# Patient Record
Sex: Male | Born: 1955
Health system: Southern US, Community
[De-identification: ages and names within clinical notes are randomized; demographics above are authoritative.]

## PROBLEM LIST (undated history)

## (undated) DIAGNOSIS — C439 Malignant melanoma of skin, unspecified: Secondary | ICD-10-CM

## (undated) DIAGNOSIS — L570 Actinic keratosis: Secondary | ICD-10-CM

## (undated) DIAGNOSIS — D689 Coagulation defect, unspecified: Secondary | ICD-10-CM

## (undated) DIAGNOSIS — J189 Pneumonia, unspecified organism: Secondary | ICD-10-CM

## (undated) DIAGNOSIS — E785 Hyperlipidemia, unspecified: Secondary | ICD-10-CM

## (undated) DIAGNOSIS — I10 Essential (primary) hypertension: Secondary | ICD-10-CM

## (undated) DIAGNOSIS — K219 Gastro-esophageal reflux disease without esophagitis: Secondary | ICD-10-CM

## (undated) DIAGNOSIS — R519 Headache, unspecified: Secondary | ICD-10-CM

## (undated) DIAGNOSIS — T7840XA Allergy, unspecified, initial encounter: Secondary | ICD-10-CM

## (undated) DIAGNOSIS — R51 Headache: Secondary | ICD-10-CM

## (undated) DIAGNOSIS — E119 Type 2 diabetes mellitus without complications: Secondary | ICD-10-CM

## (undated) DIAGNOSIS — R12 Heartburn: Secondary | ICD-10-CM

## (undated) DIAGNOSIS — R7303 Prediabetes: Secondary | ICD-10-CM

## (undated) DIAGNOSIS — N289 Disorder of kidney and ureter, unspecified: Secondary | ICD-10-CM

## (undated) DIAGNOSIS — L409 Psoriasis, unspecified: Secondary | ICD-10-CM

## (undated) DIAGNOSIS — J40 Bronchitis, not specified as acute or chronic: Secondary | ICD-10-CM

## (undated) HISTORY — DX: Essential (primary) hypertension: I10

## (undated) HISTORY — DX: Hyperlipidemia, unspecified: E78.5

## (undated) HISTORY — DX: Actinic keratosis: L57.0

## (undated) HISTORY — DX: Heartburn: R12

## (undated) HISTORY — DX: Allergy, unspecified, initial encounter: T78.40XA

## (undated) HISTORY — DX: Gastro-esophageal reflux disease without esophagitis: K21.9

## (undated) HISTORY — PX: FINGER GANGLION CYST EXCISION: SHX1636

## (undated) HISTORY — DX: Malignant melanoma of skin, unspecified: C43.9

## (undated) HISTORY — DX: Psoriasis, unspecified: L40.9

## (undated) HISTORY — DX: Coagulation defect, unspecified: D68.9

## (undated) HISTORY — DX: Prediabetes: R73.03

---

## 2009-06-19 LAB — CONVERTED CEMR LAB
HDL: 37 mg/dL
HDL: 37 mg/dL
LDL Cholesterol: 100 mg/dL
LDL Cholesterol: 100 mg/dL
LDL Cholesterol: 100 mg/dL

## 2009-07-16 ENCOUNTER — Encounter: Payer: Self-pay | Admitting: Family Medicine

## 2009-09-23 ENCOUNTER — Ambulatory Visit: Payer: Self-pay | Admitting: Family Medicine

## 2009-09-23 DIAGNOSIS — E7800 Pure hypercholesterolemia, unspecified: Secondary | ICD-10-CM | POA: Insufficient documentation

## 2009-09-23 DIAGNOSIS — G8929 Other chronic pain: Secondary | ICD-10-CM | POA: Insufficient documentation

## 2009-09-23 DIAGNOSIS — N529 Male erectile dysfunction, unspecified: Secondary | ICD-10-CM | POA: Insufficient documentation

## 2009-09-23 DIAGNOSIS — I10 Essential (primary) hypertension: Secondary | ICD-10-CM | POA: Insufficient documentation

## 2009-09-23 DIAGNOSIS — E1169 Type 2 diabetes mellitus with other specified complication: Secondary | ICD-10-CM | POA: Insufficient documentation

## 2009-09-23 DIAGNOSIS — I152 Hypertension secondary to endocrine disorders: Secondary | ICD-10-CM | POA: Insufficient documentation

## 2009-09-23 DIAGNOSIS — M545 Low back pain, unspecified: Secondary | ICD-10-CM | POA: Insufficient documentation

## 2009-09-23 DIAGNOSIS — G43009 Migraine without aura, not intractable, without status migrainosus: Secondary | ICD-10-CM | POA: Insufficient documentation

## 2009-09-23 DIAGNOSIS — K219 Gastro-esophageal reflux disease without esophagitis: Secondary | ICD-10-CM | POA: Insufficient documentation

## 2009-09-23 DIAGNOSIS — L2089 Other atopic dermatitis: Secondary | ICD-10-CM | POA: Insufficient documentation

## 2009-09-23 DIAGNOSIS — E1159 Type 2 diabetes mellitus with other circulatory complications: Secondary | ICD-10-CM | POA: Insufficient documentation

## 2009-09-25 LAB — CONVERTED CEMR LAB: PSA: 4.54 ng/mL — ABNORMAL HIGH (ref 0.10–4.00)

## 2009-10-12 ENCOUNTER — Encounter: Payer: Self-pay | Admitting: Family Medicine

## 2010-04-19 ENCOUNTER — Ambulatory Visit
Admission: RE | Admit: 2010-04-19 | Discharge: 2010-04-19 | Payer: Self-pay | Source: Home / Self Care | Attending: Family Medicine | Admitting: Family Medicine

## 2010-04-19 DIAGNOSIS — D485 Neoplasm of uncertain behavior of skin: Secondary | ICD-10-CM | POA: Insufficient documentation

## 2010-04-20 NOTE — Letter (Signed)
Summary: Records Dated 05-18-07 thru 01-26-09/Christine Earlene Plater MD  Records Dated 05-18-07 thru 01-26-09/Christine Earlene Plater MD   Imported By: Lanelle Bal 10/01/2009 08:37:30  _____________________________________________________________________  External Attachment:    Type:   Image     Comment:   External Document

## 2010-04-20 NOTE — Assessment & Plan Note (Signed)
Summary: NEW PT TO ESTAB[facility]/DLO   Vital Signs:  Patient profile:   55 year old male Height:      68 inches Weight:      194.6 pounds BMI:     29.70 Temp:     99.0 degrees F oral Pulse rate:   76 / minute Pulse rhythm:   regular BP sitting:   120 / 84  (left arm) Cuff size:   regular  Vitals Entered By: Benny Lennert CMA Duncan Dull) (September 23, 2009 10:34 AM)  History of Present Illness: Chief complaint New Patient establish  Moved from [place]...family still living there until 10/2009  HTN, well controlled: On Benazapril/HCTZ. Dx 3 years ago. Improved with lifestyle, weight loss. Exercsiing regularly.  High cholesterol, well controlled on 20  simvastain..  Last check in 06/2009 TOT 165, tri 139, HDL 37, LDL 100   Migraine..uses imitrex as needed...occuring  1 every 4 months.  Elevated PSA... 1.4 to 2.01 in 1 year  Preventive Screening-Counseling & Management  Alcohol-Tobacco     Smoking Status: never  Caffeine-Diet-Exercise     Does Patient Exercise: yes      Drug Use:  no.    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 CV:  Denies chest pain or discomfort. Resp:  Denies shortness of breath. GI:  Denies abdominal pain. GU:  Denies dysuria.  Physical Exam  General:  Overweight appearing male inNAD  Ears:  External ear exam shows no significant lesions or deformities.  Otoscopic examination reveals clear canals, tympanic membranes are  intact bilaterally without bulging, retraction, inflammation or discharge. Hearing is grossly normal bilaterally. Nose:  External nasal examination shows no deformity or inflammation. Nasal mucosa are pink and moist without lesions or exudates. Mouth:  Oral mucosa and oropharynx without lesions or exudates.  Teeth in good repair. Neck:  no carotid bruit or thyromegaly no cervical or supraclavicular lymphadenopathy  Lungs:  Normal respiratory effort, chest expands symmetrically. Lungs are clear to auscultation, no crackles or wheezes. Heart:  Normal rate and regular rhythm. S1 and S2 normal without gallop, murmur, click, rub or other extra sounds. Abdomen:  Bowel sounds positive,abdomen soft and non-tender without masses, organomegaly or hernias noted. Pulses:  R and L posterior tibial pulses are full and equal bilaterally  Extremities:  no edema  Neurologic:  No cranial nerve deficits noted. Station and gait are normal. DTRs are symmetrical throughout. Sensory, motor and coordinative functions appear intact. Skin:  Intact without suspicious lesions or rashes Psych:  Cognition and judgment appear intact. Alert and cooperative with normal attention span and concentration. No apparent delusions, illusions, hallucinations   Impression & Recommendations:  Problem # 1:  HYPERCHOLESTEROLEMIA (ICD-272.0) Well controlled. Continue current medication.  His updated medication list for this problem includes:    Simvastatin 20 Mg Tabs (Simvastatin) ..... One tablet daily  Problem # 2:  HYPERTENSION (ICD-401.9) Well controlled. Continue current medication.  His updated medication list for this problem includes:    Benazepril-hydrochlorothiazide 20-25 Mg Tabs (Benazepril-hydrochlorothiazide) ..... One  tablet daily  BP today: 120/84  Labs Reviewed: HDL: 37 (06/19/2009)   LDL: 100 (06/19/2009)     Problem # 3:  PROSTATE SPECIFIC ANTIGEN, ELEVATED (ICD-790.93)  Due for reval..trending up in last year.  Refused prostate exam today.   Complete Medication List: 1)  Benazepril-hydrochlorothiazide 20-25 Mg Tabs (Benazepril-hydrochlorothiazide) .... One tablet daily 2)  Simvastatin 20 Mg Tabs (Simvastatin) .... One tablet daily 3)  Viagra 100 Mg Tabs (Sildenafil citrate) .... Take 1/2 to 1 tablet daily as needed 4)  Betamethasone Dipropionate 0.05 % Crea (Betamethasone dipropionate) .... Aaa twice daily 5)  Vicodin 5-500 Mg Tabs (Hydrocodone-acetaminophen) .... Take 1 tablet every 4-6 hours as needed for pain 6)  Ranitidine Hcl 300 Mg Tabs (Ranitidine hcl) .Marland Kitchen.. 1 tablet at bedtime 7)  Imitrex 50 Mg Tabs (Sumatriptan succinate) .Marland Kitchen.. 1 tablet 1 time only,repeat in 2 hours as needed max 4 in 24 hours  Other Orders: T-PSA Free ([id number]) T-PSA Total ([id number]) Specimen Handling (99000)  Patient Instructions: 1)  Schedule CPX...in next few months. Prescriptions: SIMVASTATIN 20 MG TABS (SIMVASTATIN) one tablet daily  #90 x 3   Entered and Authorized by:   Kerby Nora MD   Signed by:   Kerby Nora MD on 09/23/2009   Method used:   Electronically to        [facility] (retail)             ,          Ph: [phone]       Fax: [phone]   RxID:   [id number] BENAZEPRIL-HYDROCHLOROTHIAZIDE 20-25 MG TABS (BENAZEPRIL-HYDROCHLOROTHIAZIDE) one tablet daily  #90 x 3   Entered and Authorized by:   Kerby Nora MD   Signed by:   Kerby Nora MD on 09/23/2009   Method used:   Electronically to        [facility]* (retail)             ,          Ph: [phone]       Fax: [phone]   RxID:   [id number]   Current Allergies (reviewed today): No known allergies   HDL Result Date:  06/19/2009 HDL Result:  37 HDL Next Due:  1 yr LDL Result Date:  06/19/2009 LDL Result:  100 LDL Next Due:  1 yr Flex Sig Next Due:  Not Indicated

## 2010-04-20 NOTE — Letter (Signed)
Summary: [facility]  [facility]   Imported By: Lanelle Bal 10/01/2009 11:39:32  _____________________________________________________________________  External Attachment:    Type:   Image     Comment:   External Document

## 2010-04-20 NOTE — Consult Note (Signed)
Summary: [facility]  [facility]   Imported By: Maryln Gottron 10/16/2009 11:24:08  _____________________________________________________________________  External Attachment:    Type:   Image     Comment:   External Document

## 2010-04-28 NOTE — Assessment & Plan Note (Signed)
Summary: renew meds/alc   Vital Signs:  Patient profile:   55 year old male Height:      68 inches Weight:      181.50 pounds BMI:     27.70 Temp:     98.9 degrees F oral Pulse rate:   76 / minute Pulse rhythm:   regular BP sitting:   140 / 90  (left arm) Cuff size:   regular  Vitals Entered By: Benny Lennert CMA Duncan Dull) (April 19, 2010 9:32 AM)  History of Present Illness: Chief complaint Renew Medication  Overdue for CPX.  HTN.Marland Kitchen poor control off lisinopril HCTZ 2 weeks ago, usually intermittantly ... stopped due to cost of other meds...ie viagra price.  Started viagra due to the medicaition. 130-150/90s at home.  Exercising regularly.   Elevated PSA...seen at [facility]... had prostate [place] and biopsy... nml results, no cancer. No family history. Due for 6 month PSa.  Dr. Garner Nash not in net work.. wishes to repeat here.    High cholesterol.. last check 06/2009 well controlled on simvastatin 20 mg.  Shoulder (torn rotator cuff, 5 years ago), chronic back and left hip pain... given celebrex in past . No surgies planned at this point. Cannot take NSAIDs due to elevated LFTS in past. taken ff celebrex in past by previous MD.  Using about 30 tabs of vicodin every 3 months.   Moles on back.. itching. Tld in past he need to have it looked at by previous MD.   Problems Prior to Update: 1)  Prostate Specific Antigen, Elevated  (ICD-790.93) 2)  Dermatitis, Atopic  (ICD-691.8) 3)  Erectile Dysfunction, Organic  (ICD-607.84) 4)  Back Pain, Chronic  (ICD-724.5) 5)  Prostate Specific Antigen, Elevated  (ICD-790.93) 6)  Family History of Cad Male 1st Degree Relative <50  (ICD-V17.3) 7)  Migraine, Common  (ICD-346.10) 8)  Hypercholesterolemia  (ICD-272.0) 9)  Hypertension  (ICD-401.9) 10)  Gerd  (ICD-530.81)  Current Medications (verified): 1)  Benazepril-Hydrochlorothiazide 20-25 Mg Tabs (Benazepril-Hydrochlorothiazide) .... One Tablet Daily 2)  Simvastatin 20 Mg Tabs  (Simvastatin) .... One Tablet Daily 3)  Levitra 20 Mg Tabs (Vardenafil Hcl) .... Take 1/2 To 1 Tablet Daily As Needed 4)  Betamethasone Dipropionate 0.05 % Crea (Betamethasone Dipropionate) .... Aaa Twice Daily 5)  Vicodin 5-500 Mg Tabs (Hydrocodone-Acetaminophen) .... Take 1 Tablet Every 4-6 Hours As Needed For Pain 6)  Ranitidine Hcl 300 Mg Tabs (Ranitidine Hcl) .Marland Kitchen.. 1 Tablet At Bedtime 7)  Imitrex 50 Mg Tabs (Sumatriptan Succinate) .Marland Kitchen.. 1 Tablet 1 Time Only,repeat in 2 Hours As Needed Max 4 in 24 Hours  Allergies (verified): No Known Drug Allergies  Past History:  Past medical, surgical, family and social histories (including risk factors) reviewed, and no changes noted (except as noted below).  Past Medical History: Reviewed history from 09/23/2009 and no changes required. Hypertension  Family History: Reviewed history from 09/23/2009 and no changes required. father: DM, HTN, high chol, CAD...age 55s  mother: HTN Family History of CAD Male 1st degree relative <50 siblings: healthy  Social History: Reviewed history from 09/23/2009 and no changes required. 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 CV:  Denies chest pain or discomfort. Resp:  Denies shortness of breath. GI:  Denies abdominal pain. GU:  Denies dysuria.  Physical Exam  General:  Well-developed,well-nourished,in no acute distress; alert,appropriate and cooperative throughout examination  Mouth:  MMM Neck:  no carotid bruit or thyromegaly no cervical or supraclavicular lymphadenopathy  Lungs:  Normal respiratory effort, chest expands symmetrically. Lungs are clear to auscultation, no crackles or wheezes. Heart:  Normal rate and regular rhythm. S1 and S2 normal without gallop, murmur, click, rub or other extra sounds. Abdomen:   Bowel sounds positive,abdomen soft and non-tender without masses, organomegaly or hernias noted. Pulses:  R and L posterior tibial pulses are full and equal bilaterally  Extremities:  no edema  Skin:  irregular dry flaky mole right upper back.. slightly irregular, no bleeding, light brown in color Psych:  Cognition and judgment appear intact. Alert and cooperative with normal attention span and concentration. No apparent delusions, illusions, hallucinations   Impression & Recommendations:  Problem # 1:  HYPERTENSION (ICD-401.9) Assessment Deteriorated  Restart BP med...if Bps running low consider decrease in med. Encouraged exercise, weight loss, healthy eating habits.  His updated medication list for this problem includes:    Benazepril-hydrochlorothiazide 20-25 Mg Tabs (Benazepril-hydrochlorothiazide) ..... One tablet daily  BP today: 140/90 Prior BP: 120/84 (09/23/2009)  Labs Reviewed: HDL: 37 (06/19/2009)   LDL: 100 (06/19/2009)     Problem # 2:  BACK PAIN, CHRONIC (ICD-724.5) Assessment: Unchanged  Refilled MEds. Surgery and NSAIDs not option for pt.  His updated medication list for this problem includes:    Vicodin 5-500 Mg Tabs (Hydrocodone-acetaminophen) .Marland Kitchen... Take 1 tablet every 4-6 hours as needed for pain  Discussed use of moist heat or ice, modified activities, medications, and stretching/strengthening exercises. Back care instructions given. To be seen in 2 weeks if no improvement; sooner if worsening of symptoms.   Problem # 3:  NEOPLASM, SKIN, UNCERTAIN BEHAVIOR (ICD-238.2) Reeurn for punch biopsy. Excision at next appt.   Problem # 4:  ERECTILE DYSFUNCTION, ORGANIC (ICD-607.84) Changed to elvitra for affordability.  His updated medication list for this problem includes:    Levitra 20 Mg Tabs (Vardenafil hcl) .Marland Kitchen... Take 1/2 to 1 tablet daily as needed  Problem # 5:  PROSTATE SPECIFIC ANTIGEN, ELEVATED (ICD-790.93) Get last OV note from Dr. Garner Nash. Check PSA  next lab check . Prostate exam at upcoming CPX.  May need referral to Uro in network.  Problem # 6:  HYPERCHOLESTEROLEMIA (ICD-272.0) Due for reeval.  His updated medication list for this problem includes:    Simvastatin 20 Mg Tabs (Simvastatin) ..... One tablet daily  Complete Medication List: 1)  Benazepril-hydrochlorothiazide 20-25 Mg Tabs (Benazepril-hydrochlorothiazide) .... One tablet daily 2)  Simvastatin 20 Mg Tabs (Simvastatin) .... One tablet daily 3)  Levitra 20 Mg Tabs (Vardenafil hcl) .... Take 1/2 to 1 tablet daily as needed 4)  Betamethasone Dipropionate 0.05 % Crea (Betamethasone dipropionate) .... Aaa twice daily 5)  Vicodin 5-500 Mg Tabs (Hydrocodone-acetaminophen) .... Take 1 tablet every 4-6 hours as needed for pain 6)  Ranitidine Hcl 300 Mg Tabs (Ranitidine hcl) .Marland Kitchen.. 1 tablet at bedtime 7)  Imitrex 50 Mg Tabs (Sumatriptan succinate) .Marland Kitchen.. 1 tablet 1 time only,repeat in 2 hours as needed max 4 in 24 hours  Patient Instructions: 1)  Schedule CPX with labs prior in April.... make 45 min to biopsy back lesion at that time. 2)   CMET, lipids Dx 272.0, PSA Dx v76.44 3)   Restart BP med.. follow Bps at home.. if <110/65.. call for med decrease. Prescriptions: VICODIN 5-500 MG TABS (HYDROCODONE-ACETAMINOPHEN) TAKE 1 TABLET EVERY 4-6 HOURS AS NEEDED FOR PAIN  #30 x 0   Entered and Authorized by:   Kerby Nora  MD   Signed by:   Kerby Nora MD on 04/19/2010   Method used:   Print then Give to Patient   RxID:   [id number] LEVITRA 20 MG TABS (VARDENAFIL HCL) Take 1/2 to 1 tablet daily as needed  #30 x 0   Entered and Authorized by:   Kerby Nora MD   Signed by:   Kerby Nora MD on 04/19/2010   Method used:   Electronically to        [facility]  [address] (retail)       [address]       [place], [place]  [address]       Ph: [phone]       Fax: [phone]   RxID:   [id number]    Orders Added: 1)  Est. Patient Level IV  [25956]    Current Allergies (reviewed today): No known allergies

## 2010-08-02 ENCOUNTER — Other Ambulatory Visit: Payer: Self-pay | Admitting: *Deleted

## 2010-08-02 NOTE — Telephone Encounter (Signed)
Pt was given levitra to try but it didn't work as well as viagra 100 mg.  He would like to go back to that. Also he would like a refill on vicodin, he says he only gets this filled about 3 months.  [facility]ses [facility] [address]

## 2010-08-03 MED ORDER — SILDENAFIL CITRATE 100 MG PO TABS: 100.0000 mg | ORAL_TABLET | Freq: Every day | ORAL | Status: DC | PRN

## 2010-08-03 MED ORDER — HYDROCODONE-ACETAMINOPHEN 5-500 MG PO TABS: ORAL_TABLET | ORAL | Status: DC

## 2010-08-03 NOTE — Telephone Encounter (Signed)
rx called to pharmacy 

## 2010-08-06 ENCOUNTER — Telehealth: Payer: Self-pay | Admitting: *Deleted

## 2010-08-06 NOTE — Telephone Encounter (Signed)
Pt recently [facility] but he says it is way too expensive there and he is asking that a 90 day script be sent to [facility].  He didn't pick up the refill [facility] to [facility].

## 2010-08-09 MED ORDER — SILDENAFIL CITRATE 100 MG PO TABS: 100.0000 mg | ORAL_TABLET | Freq: Every day | ORAL | Status: DC | PRN

## 2010-09-23 ENCOUNTER — Other Ambulatory Visit: Payer: Self-pay | Admitting: Family Medicine

## 2010-09-23 NOTE — Telephone Encounter (Signed)
Patient has appt at end of july

## 2010-09-24 ENCOUNTER — Telehealth: Payer: Self-pay | Admitting: *Deleted

## 2010-09-24 MED ORDER — SILDENAFIL CITRATE 100 MG PO TABS: 50.0000 mg | ORAL_TABLET | ORAL | Status: DC | PRN

## 2010-09-24 NOTE — Telephone Encounter (Signed)
Pt requesting refill for Viagra 100mg  [Rx changed to Levitra 20mg  on 04/19/2010]. Pt wants to pay Out of Pocket & only wants [8] tablets prescribed to avoid being sent to [employer] authorization required] to pharmacy in [facility] used in [facility]].

## 2010-09-27 ENCOUNTER — Other Ambulatory Visit: Payer: Self-pay | Admitting: *Deleted

## 2010-09-27 MED ORDER — SILDENAFIL CITRATE 100 MG PO TABS: ORAL_TABLET | ORAL | Status: DC

## 2010-09-27 NOTE — Telephone Encounter (Signed)
Pt says we sent viagra in on Friday all wrong.  It was not supposed to go to [facility], he wanted it sent to [facility] in [place].  He told [facility] that he doesn't want that refill.  Script needs to read that he takes one a day, so that 8 will last 8 days, even though he will only get filled every 30 days. ( He wants # 8 called in)  He will get a new script at the end of the month to send to [facility].

## 2010-10-11 ENCOUNTER — Encounter: Payer: Self-pay | Admitting: Family Medicine

## 2010-10-11 ENCOUNTER — Telehealth: Payer: Self-pay | Admitting: Family Medicine

## 2010-10-11 DIAGNOSIS — R972 Elevated prostate specific antigen [PSA]: Secondary | ICD-10-CM

## 2010-10-11 DIAGNOSIS — I10 Essential (primary) hypertension: Secondary | ICD-10-CM

## 2010-10-11 DIAGNOSIS — E7800 Pure hypercholesterolemia, unspecified: Secondary | ICD-10-CM

## 2010-10-11 NOTE — Telephone Encounter (Signed)
Message copied by Excell Seltzer on Mon Oct 11, 2010  8:02 AM ------      Message from: Baldomero Lamy      Created: Tue Oct 05, 2010  1:12 PM      Regarding: Cpx labs tues       Please order  future cpx labs for pt's upcomming lab appt.      Thanks      Rodney Booze

## 2010-10-12 ENCOUNTER — Other Ambulatory Visit (INDEPENDENT_AMBULATORY_CARE_PROVIDER_SITE_OTHER): Payer: Self-pay | Admitting: Family Medicine

## 2010-10-12 DIAGNOSIS — R972 Elevated prostate specific antigen [PSA]: Secondary | ICD-10-CM

## 2010-10-12 DIAGNOSIS — E7800 Pure hypercholesterolemia, unspecified: Secondary | ICD-10-CM

## 2010-10-12 DIAGNOSIS — I10 Essential (primary) hypertension: Secondary | ICD-10-CM

## 2010-10-12 LAB — LIPID PANEL
Cholesterol: 186 mg/dL (ref 0–200)
LDL Cholesterol: 119 mg/dL — ABNORMAL HIGH (ref 0–99)
Triglycerides: 122 mg/dL (ref 0.0–149.0)

## 2010-10-12 LAB — COMPREHENSIVE METABOLIC PANEL
Albumin: 4.3 g/dL (ref 3.5–5.2)
Alkaline Phosphatase: 54 U/L (ref 39–117)
BUN: 13 mg/dL (ref 6–23)
CO2: 30 mEq/L (ref 19–32)
Calcium: 9.1 mg/dL (ref 8.4–10.5)
GFR: 71.24 mL/min (ref >60.00)
Glucose, Bld: 93 mg/dL (ref 70–99)
Potassium: 4.1 mEq/L (ref 3.5–5.1)

## 2010-10-13 ENCOUNTER — Ambulatory Visit (INDEPENDENT_AMBULATORY_CARE_PROVIDER_SITE_OTHER): Payer: BC Managed Care – PPO | Admitting: Family Medicine

## 2010-10-13 ENCOUNTER — Other Ambulatory Visit: Payer: Self-pay | Admitting: *Deleted

## 2010-10-13 ENCOUNTER — Encounter: Payer: Self-pay | Admitting: Family Medicine

## 2010-10-13 DIAGNOSIS — E7800 Pure hypercholesterolemia, unspecified: Secondary | ICD-10-CM

## 2010-10-13 DIAGNOSIS — Z1211 Encounter for screening for malignant neoplasm of colon: Secondary | ICD-10-CM

## 2010-10-13 DIAGNOSIS — Z0000 Encounter for general adult medical examination without abnormal findings: Secondary | ICD-10-CM

## 2010-10-13 DIAGNOSIS — G8929 Other chronic pain: Secondary | ICD-10-CM | POA: Insufficient documentation

## 2010-10-13 DIAGNOSIS — I10 Essential (primary) hypertension: Secondary | ICD-10-CM

## 2010-10-13 DIAGNOSIS — R972 Elevated prostate specific antigen [PSA]: Secondary | ICD-10-CM

## 2010-10-13 MED ORDER — SUMATRIPTAN SUCCINATE 100 MG PO TABS: 100.0000 mg | ORAL_TABLET | Freq: Once | ORAL | Status: DC | PRN

## 2010-10-13 NOTE — Assessment & Plan Note (Signed)
Borderline control. Follow at home. i would recommend full tab of BP med daily. Pt is hesitant due to ED SE.

## 2010-10-13 NOTE — Assessment & Plan Note (Signed)
Pt no longer wants to go to Urologist for PSAs.     Will get last note to verify we are able to check 6 month PSA and then space to yearly.

## 2010-10-13 NOTE — Telephone Encounter (Signed)
Yes

## 2010-10-13 NOTE — Assessment & Plan Note (Signed)
LDL at goal <130 but higher than last OV. Continue current medication.

## 2010-10-13 NOTE — Progress Notes (Signed)
Subjective:    Patient ID: Dennis Hicks, male    DOB: 1956/03/08, 55 y.o.   MRN: [id number]  HPI The patient is here for annual wellness exam and preventative care.    Hypertension: borderline control today on lotensin Using medication without problems or lightheadedness:  Chest pain with exertion: None Edema:None Short of breath:None Average home BPs: 130-140/75-90 Other issues: Good eating habits except breakfast...sausage.  No exercise.  Elevated Cholesterol: on zocor 20 mg daily Using medications without problems: Muscle aches:  Other complaints:   Elevated PSA.Marland Kitchen Last year referred to Dr., Garner Nash for increase in PSA over 1 year period 1 up to 4s Has prostate biopsy 10/2009.. Negative. Last prostate exam at that time. 3 months ago PSA was 3.5 Repeat PSA since then shows stable PSA. He wants [place] to take over PSA testing.  Has follow up with urologist for PSA in 6 months.   Review of Systems  Constitutional: Negative for fever, fatigue and unexpected weight change.  HENT: Negative for ear pain, congestion, sore throat, rhinorrhea, trouble swallowing and postnasal drip.   Eyes: Negative for pain.  Respiratory: Negative for cough, shortness of breath and wheezing.   Cardiovascular: Negative for chest pain, palpitations and leg swelling.  Gastrointestinal: Negative for nausea, abdominal pain, diarrhea, constipation and blood in stool.  Genitourinary: Negative for dysuria, urgency, hematuria, discharge, penile swelling, scrotal swelling, difficulty urinating, penile pain and testicular pain.  Skin: Negative for rash.  Neurological: Negative for syncope, weakness, light-headedness, numbness and headaches.  Psychiatric/Behavioral: Negative for behavioral problems and dysphoric mood. The patient is not nervous/anxious.        Objective:   Physical Exam  Constitutional: He appears well-developed and well-nourished.  Non-toxic appearance. He does not appear ill. No distress.    HENT:  Head: Normocephalic and atraumatic.  Right Ear: Hearing, tympanic membrane, external ear and ear canal normal.  Left Ear: Hearing, tympanic membrane, external ear and ear canal normal.  Nose: Nose normal.  Mouth/Throat: Uvula is midline, oropharynx is clear and moist and mucous membranes are normal.  Eyes: Conjunctivae, EOM and lids are normal. Pupils are equal, round, and reactive to light. No foreign bodies found.  Neck: Trachea normal, normal range of motion and phonation normal. Neck supple. Carotid bruit is not present. No mass and no thyromegaly present.  Cardiovascular: Normal rate, regular rhythm, S1 normal, S2 normal, intact distal pulses and normal pulses.  Exam reveals no gallop.   No murmur heard. Pulmonary/Chest: Breath sounds normal. He has no wheezes. He has no rhonchi. He has no rales.  Abdominal: Soft. Normal appearance and bowel sounds are normal. There is no hepatosplenomegaly. There is no tenderness. There is no rebound, no guarding and no CVA tenderness. No hernia. Hernia confirmed negative in the right inguinal area and confirmed negative in the left inguinal area.  Genitourinary: Testes normal and penis normal. Rectal exam shows no external hemorrhoid, no internal hemorrhoid, no fissure, no mass, no tenderness and anal tone normal. Guaiac negative stool. Prostate is enlarged. Prostate is not tender. Right testis shows no mass and no tenderness. Left testis shows no mass and no tenderness. No paraphimosis or penile tenderness.       No prostate nodules  Lymphadenopathy:    He has no cervical adenopathy.       Right: No inguinal adenopathy present.       Left: No inguinal adenopathy present.  Neurological: He is alert. He has normal strength and normal reflexes. No cranial nerve deficit or  sensory deficit. Gait normal.  Skin: Skin is warm, dry and intact. No rash noted.  Psychiatric: He has a normal mood and affect. His speech is normal and behavior is normal.  Judgment normal.          Assessment & Plan:  Complete Physical Exam: The patient's preventative maintenance and recommended screening tests for an annual wellness exam were reviewed in full today. Brought up to date unless services declined.  Counselled on the importance of diet, exercise, and its role in overall health and mortality. The patient's FH and SH was reviewed, including their home life, tobacco status, and drug and alcohol status.  Schedule colonscopy.  Vaccines up to date.

## 2010-10-13 NOTE — Telephone Encounter (Signed)
Pt was seen today and script for imitrex was sent to [facility].  He said that's incorrect, [facility] [place].  OK to send to them?

## 2010-10-13 NOTE — Progress Notes (Signed)
Addended by: Alvina Chou on: 10/13/2010 04:17 PM   Modules accepted: Orders

## 2010-10-13 NOTE — Patient Instructions (Addendum)
Schedule PSA in 01/2011 Follow BP at home, if BP greater than or equal to 140/90. Consider increasing to 1 full tablet daily of BP med.  Work on restarting exericse. Work on low cholesterol diet.

## 2010-10-14 MED ORDER — SUMATRIPTAN SUCCINATE 100 MG PO TABS: 100.0000 mg | ORAL_TABLET | Freq: Once | ORAL | Status: DC | PRN

## 2010-10-19 ENCOUNTER — Encounter: Payer: Self-pay | Admitting: Family Medicine

## 2010-10-26 ENCOUNTER — Other Ambulatory Visit: Payer: Self-pay | Admitting: *Deleted

## 2010-10-26 MED ORDER — SILDENAFIL CITRATE 100 MG PO TABS: ORAL_TABLET | ORAL | Status: DC

## 2010-11-19 ENCOUNTER — Other Ambulatory Visit: Payer: Self-pay | Admitting: *Deleted

## 2010-11-19 MED ORDER — HYDROCODONE-ACETAMINOPHEN 5-500 MG PO TABS: ORAL_TABLET | ORAL | Status: DC

## 2010-11-19 NOTE — Telephone Encounter (Signed)
CPX in 09/2010. Last refill 07/2010  For chroninc back pain

## 2010-11-19 NOTE — Telephone Encounter (Signed)
rx [facility] [place]

## 2010-11-22 ENCOUNTER — Other Ambulatory Visit: Payer: Self-pay | Admitting: Family Medicine

## 2010-12-01 ENCOUNTER — Telehealth: Payer: Self-pay | Admitting: *Deleted

## 2010-12-01 NOTE — Telephone Encounter (Signed)
Patient says he needs to cancel his colonoscopy appt but has lost all of his paperwork and doesn't know where to call but is concerned to call soon so that he will not be charged.  Please advise.

## 2010-12-02 NOTE — Telephone Encounter (Signed)
Looks like appt already cancelled and patient advised

## 2010-12-08 ENCOUNTER — Other Ambulatory Visit: Payer: BC Managed Care – PPO | Admitting: Gastroenterology

## 2010-12-10 ENCOUNTER — Ambulatory Visit: Payer: BC Managed Care – PPO | Admitting: Family Medicine

## 2010-12-13 ENCOUNTER — Other Ambulatory Visit: Payer: Self-pay | Admitting: Family Medicine

## 2010-12-17 ENCOUNTER — Other Ambulatory Visit: Payer: BC Managed Care – PPO | Admitting: Gastroenterology

## 2010-12-20 ENCOUNTER — Other Ambulatory Visit: Payer: Self-pay | Admitting: Family Medicine

## 2011-02-14 ENCOUNTER — Other Ambulatory Visit: Payer: Self-pay | Admitting: *Deleted

## 2011-02-14 MED ORDER — HYDROCODONE-ACETAMINOPHEN 5-500 MG PO TABS: ORAL_TABLET | ORAL | Status: DC

## 2011-02-14 MED ORDER — SILDENAFIL CITRATE 100 MG PO TABS: 100.0000 mg | ORAL_TABLET | ORAL | Status: DC | PRN

## 2011-02-14 NOTE — Telephone Encounter (Signed)
I do not prescribe 90 days of pain medication at a  Time. Will prescribe the 36 viagra.   Last OV 09/2010, last vicodin refill 10/2010

## 2011-02-14 NOTE — Telephone Encounter (Signed)
Patient called requesting refills on Viagra #36 and Hydrocodone for [facility]. Patient would like a 90 day supply on each because of the cost.  Please advise patient regarding these refills.

## 2011-02-15 MED ORDER — SILDENAFIL CITRATE 100 MG PO TABS: 100.0000 mg | ORAL_TABLET | ORAL | Status: DC | PRN

## 2011-02-15 NOTE — Telephone Encounter (Signed)
Addended by: Consuello Masse on: 02/15/2011 07:27 AM   Modules accepted: Orders

## 2011-05-30 ENCOUNTER — Other Ambulatory Visit: Payer: Self-pay | Admitting: Family Medicine

## 2011-08-01 ENCOUNTER — Ambulatory Visit (INDEPENDENT_AMBULATORY_CARE_PROVIDER_SITE_OTHER): Payer: 59 | Admitting: Family Medicine

## 2011-08-01 ENCOUNTER — Encounter: Payer: Self-pay | Admitting: Family Medicine

## 2011-08-01 VITALS — BP 110/80 | HR 68 | Temp 98.1°F | Ht 68.0 in | Wt 189.0 lb

## 2011-08-01 DIAGNOSIS — J069 Acute upper respiratory infection, unspecified: Secondary | ICD-10-CM

## 2011-08-01 MED ORDER — FLUTICASONE PROPIONATE 50 MCG/ACT NA SUSP: 2.0000 | Freq: Every day | NASAL | Status: DC

## 2011-08-01 MED ORDER — BETAMETHASONE DIPROPIONATE 0.05 % EX CREA: 1.0000 "application " | TOPICAL_CREAM | Freq: Two times a day (BID) | CUTANEOUS | Status: DC

## 2011-08-01 MED ORDER — GUAIFENESIN-CODEINE 100-10 MG/5ML PO SYRP: 5.0000 mL | ORAL_SOLUTION | Freq: Four times a day (QID) | ORAL | Status: AC | PRN

## 2011-08-01 NOTE — Assessment & Plan Note (Signed)
With reassuring exam-no wheeze Disc symptomatic care - see instructions on AVS  Trial of robitussin with codeine  Fluids/rest  Handout given Update if not starting to improve in a week or if worsening

## 2011-08-01 NOTE — Patient Instructions (Addendum)
You have a viral upper resp illness , - and it will need to run its course Drink lots of fluids  Take the cough syrup as needed - and watch out for sedation  Also try flonase nasal spray - for ears and sinus congestion

## 2011-08-01 NOTE — Progress Notes (Signed)
Subjective:    Patient ID: Dennis Hicks, male    DOB: [birth year], 56 y.o.   MRN: [id number]  HPI Woke up last Thursday with a very bad ST - one side and then another  Started gargling with salt water and using netti pot Went to head - cong and runny nose  Now has a lot of post nasal drip  Now chest cong- not coughing up much - dry cough but can hear a rattle Non smoker   Ears feel plugged   No fever No sinus pain   mucinex DM , benadryl , and cough med otc  Is drinking lots of water    Patient Active Problem List  Diagnoses  . HYPERCHOLESTEROLEMIA  . MIGRAINE, COMMON  . HYPERTENSION  . GERD  . ERECTILE DYSFUNCTION, ORGANIC  . DERMATITIS, ATOPIC  . BACK PAIN, CHRONIC  . PROSTATE SPECIFIC ANTIGEN, ELEVATED  . Routine general medical examination at a health care facility   Past Medical History  Diagnosis Date  . Hypertension    No past surgical history on file. History  Substance Use Topics  . Smoking status: Never Smoker   . Smokeless tobacco: Not on file  . Alcohol Use: Yes   Family History  Problem Relation Age of Onset  . Hypertension Mother   . Coronary artery disease Father   . Hypertension Father   . Hyperlipidemia Father   . Diabetes Father    No Known Allergies Current Outpatient Prescriptions on File Prior to Visit  Medication Sig Dispense Refill  . benazepril-hydrochlorthiazide (LOTENSIN HCT) 20-25 MG per tablet TAKE 1 TABLET DAILY  90 tablet  2  . betamethasone dipropionate (DIPROLENE) 0.05 % cream Apply 1 application topically 2 (two) times daily.        Marland Kitchen HYDROcodone-acetaminophen (VICODIN) 5-500 MG per tablet Take one tablet every 4-6 hours as needed for pain  30 tablet  0  . ranitidine (ZANTAC) 300 MG tablet Take 300 mg by mouth at bedtime.        . sildenafil (VIAGRA) 100 MG tablet Take 1 tablet (100 mg total) by mouth as needed for erectile dysfunction.  36 tablet  0  . simvastatin (ZOCOR) 20 MG tablet TAKE 1 TABLET DAILY  90 tablet  3  .  SUMAtriptan (IMITREX) 100 MG tablet Take 1 tablet (100 mg total) by mouth once as needed for migraine (May repeat in 2 hours if headache not improving.).  10 tablet  0  . sildenafil (VIAGRA) 100 MG tablet Take 1 tablet (100 mg total) by mouth daily as needed for erectile dysfunction.  10 tablet  3  . DISCONTD: VIAGRA 100 MG tablet TAKE 1 TABLET AS NEEDED FOR ERECTILE DYSFUNCTION  36 tablet  0       Review of Systems Review of Systems  Constitutional: Negative for , appetite change, fatigue and unexpected weight change.  Eyes: Negative for pain and visual disturbance.  ENT for congestion / runny nose and drip/ improved ST Respiratory: Negative for wheeze or sob   Cardiovascular: Negative for cp or palpitations    Gastrointestinal: Negative for nausea, diarrhea and constipation.  Genitourinary: Negative for urgency and frequency.  Skin: Negative for pallor or rash   Neurological: Negative for weakness, light-headedness, numbness and headaches.  Hematological: Negative for adenopathy. Does not bruise/bleed easily.  Psychiatric/Behavioral: Negative for dysphoric mood. The patient is not nervous/anxious.         Objective:   Physical Exam  Constitutional: He appears well-developed and well-nourished.  HENT:  Head: Normocephalic and atraumatic.  Right Ear: External ear normal.  Left Ear: External ear normal.  Mouth/Throat: Oropharynx is clear and moist.       Nares are injected and congested  No sinus tenderness   Eyes: Conjunctivae and EOM are normal. Pupils are equal, round, and reactive to light. Right eye exhibits no discharge. Left eye exhibits no discharge.  Neck: Normal range of motion. Neck supple. No thyromegaly present.  Cardiovascular: Normal rate and regular rhythm.   Pulmonary/Chest: Effort normal and breath sounds normal. No respiratory distress. He has no wheezes. He has no rales. He exhibits no tenderness.       Harsh bs  Mild occ rhonchi  Lymphadenopathy:    He has  no cervical adenopathy.  Skin: Skin is warm and dry. No rash noted.  Psychiatric: He has a normal mood and affect.          Assessment & Plan:

## 2011-08-05 ENCOUNTER — Other Ambulatory Visit: Payer: Self-pay

## 2011-08-05 MED ORDER — HYDROCODONE-ACETAMINOPHEN 5-500 MG PO TABS: ORAL_TABLET | ORAL | Status: DC

## 2011-08-05 NOTE — Telephone Encounter (Signed)
Patient advised and transferred to schedule appt and medication called to pharmacy

## 2011-08-05 NOTE — Telephone Encounter (Signed)
pt left v/m requesting refill Hydrocodone apap 5-500 mg [facility] [place]. Pt last seen 08/01/11. Pt can be reached at [phone] or [phone] ext [phone].Please advise.

## 2011-08-05 NOTE — Telephone Encounter (Signed)
Can refill once, next CPX ion 09/2011.  Make sure to take pt to not take at same time as cough suppressant with codeine to avoid oversedation.

## 2011-09-19 ENCOUNTER — Other Ambulatory Visit: Payer: Self-pay | Admitting: Family Medicine

## 2011-09-19 NOTE — Telephone Encounter (Signed)
Patient has up coming appt 11-2011

## 2011-10-07 ENCOUNTER — Other Ambulatory Visit: Payer: Self-pay | Admitting: Family Medicine

## 2011-11-14 ENCOUNTER — Telehealth: Payer: Self-pay | Admitting: Family Medicine

## 2011-11-14 DIAGNOSIS — E7800 Pure hypercholesterolemia, unspecified: Secondary | ICD-10-CM

## 2011-11-14 DIAGNOSIS — I10 Essential (primary) hypertension: Secondary | ICD-10-CM

## 2011-11-14 DIAGNOSIS — R972 Elevated prostate specific antigen [PSA]: Secondary | ICD-10-CM

## 2011-11-14 NOTE — Telephone Encounter (Signed)
Message copied by Excell Seltzer on Mon Nov 14, 2011 10:54 AM ------      Message from: Baldomero Lamy      Created: Mon Nov 14, 2011 10:16 AM      Regarding: Cpx labs Fri  9/6       Please order  future cpx labs for pt's upcomming lab appt.      Thanks      Rodney Booze

## 2011-11-15 ENCOUNTER — Telehealth: Payer: Self-pay | Admitting: Family Medicine

## 2011-11-15 NOTE — Telephone Encounter (Signed)
Pt calling on 11/15/11 states he needs a refill on his Migraine Medication/ Imitrex  100 mg  called to [facility] [address] in [place] [phone]/[address] Dr Drue Novel; may refill Imitrex 100 mg/dispense 10 tablets with no refill/directions take one with Migraine Headache may repeat in 2 hours if pain not relieved.

## 2011-11-16 ENCOUNTER — Telehealth: Payer: Self-pay | Admitting: Family Medicine

## 2011-11-16 NOTE — Telephone Encounter (Signed)
Triage Record Num: [id number] Operator: Sula Rumple Patient Name: Dennis Hicks Call Date & Time: 11/15/2011 5:07:59PM Patient Phone: [phone] PCP: Kerby Nora Patient Gender: Male PCP Fax : [phone] Patient DOB: May 25, 1955 Practice Name: [facility] Reason for Call: Pt calling on 11/15/11 states he needs a refill on his Migraine Medication/ Imitrex 100 mg called to [facility] in [place] [phone]/NUU Dr Drue Novel; may refill Imitrex 100 mg/dispense 10 tablets with no refill Protocol(s) Used: Medication Questions - Adult Recommended Outcome per Protocol: Provided Health Information Reason for Outcome: Caller has medication question(s) that was answered with available resources Care Advice: ~ 11/15/2011 6:19:21PM Page 1 of 1 [id number]

## 2011-11-17 NOTE — Telephone Encounter (Signed)
Agreed -

## 2011-11-25 ENCOUNTER — Other Ambulatory Visit (INDEPENDENT_AMBULATORY_CARE_PROVIDER_SITE_OTHER): Payer: 59

## 2011-11-25 ENCOUNTER — Other Ambulatory Visit: Payer: Self-pay | Admitting: Family Medicine

## 2011-11-25 DIAGNOSIS — I10 Essential (primary) hypertension: Secondary | ICD-10-CM

## 2011-11-25 DIAGNOSIS — R972 Elevated prostate specific antigen [PSA]: Secondary | ICD-10-CM

## 2011-11-25 DIAGNOSIS — E7800 Pure hypercholesterolemia, unspecified: Secondary | ICD-10-CM

## 2011-11-25 LAB — PSA, TOTAL AND FREE
PSA, Free: 0.49 ng/mL
PSA: 2.93 ng/mL (ref <=4.00)

## 2011-11-25 LAB — COMPREHENSIVE METABOLIC PANEL
ALT: 44 U/L (ref 0–53)
AST: 31 U/L (ref 0–37)
CO2: 29 mEq/L (ref 19–32)
Calcium: 8.8 mg/dL (ref 8.4–10.5)
Chloride: 103 mEq/L (ref 96–112)
Creatinine, Ser: 1.2 mg/dL (ref 0.4–1.5)
GFR: 66.86 mL/min (ref >60.00)
Sodium: 138 mEq/L (ref 135–145)
Total Bilirubin: 0.7 mg/dL (ref 0.3–1.2)
Total Protein: 6.6 g/dL (ref 6.0–8.3)

## 2011-11-25 LAB — LIPID PANEL
HDL: 38 mg/dL — ABNORMAL LOW (ref >39.00)
Total CHOL/HDL Ratio: 4
VLDL: 23.4 mg/dL (ref 0.0–40.0)

## 2011-11-25 NOTE — Telephone Encounter (Signed)
Overdue for CPX..have pt schedule- Refill once until then

## 2011-11-29 ENCOUNTER — Ambulatory Visit (INDEPENDENT_AMBULATORY_CARE_PROVIDER_SITE_OTHER): Payer: 59 | Admitting: Family Medicine

## 2011-11-29 ENCOUNTER — Encounter: Payer: Self-pay | Admitting: Family Medicine

## 2011-11-29 VITALS — BP 130/74 | HR 88 | Temp 98.4°F | Ht 68.0 in | Wt 194.8 lb

## 2011-11-29 DIAGNOSIS — D485 Neoplasm of uncertain behavior of skin: Secondary | ICD-10-CM

## 2011-11-29 DIAGNOSIS — I10 Essential (primary) hypertension: Secondary | ICD-10-CM

## 2011-11-29 DIAGNOSIS — Z0000 Encounter for general adult medical examination without abnormal findings: Secondary | ICD-10-CM

## 2011-11-29 DIAGNOSIS — E7800 Pure hypercholesterolemia, unspecified: Secondary | ICD-10-CM

## 2011-11-29 DIAGNOSIS — R972 Elevated prostate specific antigen [PSA]: Secondary | ICD-10-CM

## 2011-11-29 DIAGNOSIS — Z1212 Encounter for screening for malignant neoplasm of rectum: Secondary | ICD-10-CM

## 2011-11-29 DIAGNOSIS — M542 Cervicalgia: Secondary | ICD-10-CM

## 2011-11-29 DIAGNOSIS — M549 Dorsalgia, unspecified: Secondary | ICD-10-CM

## 2011-11-29 MED ORDER — CYCLOBENZAPRINE HCL 10 MG PO TABS: 5.0000 mg | ORAL_TABLET | Freq: Every evening | ORAL | Status: AC | PRN

## 2011-11-29 MED ORDER — HYDROCODONE-ACETAMINOPHEN 5-500 MG PO TABS: ORAL_TABLET | ORAL | Status: DC

## 2011-11-29 NOTE — Assessment & Plan Note (Signed)
MSK strain/spasm. No sign of cervical radiculopathy.  Info on stretching, amssage given. Heat, NSAIDs contraindicated. Use muscle relaxant at bedtime and vicodin prn. Call if not improving.

## 2011-11-29 NOTE — Patient Instructions (Addendum)
Stretching exercises in neck, heat, massage. Can use vicodin for pain. Muscle relaxant at night. Call if not improving as expected. Pick up stool test in the lab. Get flu vaccine at pharmacy.

## 2011-11-29 NOTE — Assessment & Plan Note (Signed)
Well controlled. Continue current medication.  

## 2011-11-29 NOTE — Progress Notes (Signed)
The patient is here for annual wellness exam and preventative care.   Over weekend.. He noted pain in left posterior neck. Radiates up to top of head. No radiculopathy to left shoulder. Increased pain with moving neck. No known injury.  Has been taking 6 ASA a day, tried tylneol, aleve. No left upper ext weakness or tingling.  No fever.  History of arthritis in neck.. No past surgeries. Has an issue of kidney issue with NSAIDs in [place]...told to not use this.  Hypertension: Borderline control today on lotensin  Using medication without problems or lightheadedness: None Chest pain with exertion: None  Edema:None  Short of breath:None  Average home BPs: rarely checking, but few weeks ago 120/67 Other issues: Good eating habits except breakfast...sausage.  No exercise except golf.  Elevated Cholesterol: At goal LDL <130 on zocor 20 mg daily  Lab Results  Component Value Date   CHOL 163 11/25/2011   HDL 38.00* 11/25/2011   LDLCALC 102* 11/25/2011   TRIG 117.0 11/25/2011   CHOLHDL 4 11/25/2011  Using medications without problems:  Muscle aches: None Other complaints:   Elevated PSA.. 2011 referred to Dr., Garner Nash for increase in PSA over 1 year period 1 up to 4s  Has prostate biopsy 10/2009.. Negative. Last prostate exam at that time.  3 months ago PSA was 3.5  Repeat PSA since then shows stable PSA.  He wants [place] to take over PSA testing.   Lab Results  Component Value Date   PSA 2.93 11/25/2011   PSA 3.46 10/12/2010   PSA 4.54* 09/23/2009   Uses vicodin  several a week, no more than 2 a day for low back pain and right wrist pain where he has calcium deposit. Last refill 07/2011.. #30. He needs refills now.   Review of Systems  Constitutional: Negative for fever, fatigue and unexpected weight change.  HENT: Negative for ear pain, congestion, sore throat, rhinorrhea, trouble swallowing and postnasal drip.  Eyes: Negative for pain.  Respiratory: Negative for cough, shortness of breath  and wheezing.  Cardiovascular: Negative for chest pain, palpitations and leg swelling.  Gastrointestinal: Negative for nausea, abdominal pain, diarrhea, constipation and blood in stool.  Genitourinary: Negative for dysuria, urgency, hematuria, discharge, penile swelling, scrotal swelling, difficulty urinating, penile pain and testicular pain.  Skin: Negative for rash.  Neurological: Negative for syncope, weakness, light-headedness, numbness and headaches.  Psychiatric/Behavioral: Negative for behavioral problems and dysphoric mood. The patient is not nervous/anxious.    Objective:   Physical Exam  Constitutional: He appears well-developed and well-nourished. Non-toxic appearance. He does not appear ill. No distress.  HENT:  Head: Normocephalic and atraumatic.  Right Ear: Hearing, tympanic membrane, external ear and ear canal normal.  Left Ear: Hearing, tympanic membrane, external ear and ear canal normal.  Nose: Nose normal.  Mouth/Throat: Uvula is midline, oropharynx is clear and moist and mucous membranes are normal.  Eyes: Conjunctivae, EOM and lids are normal. Pupils are equal, round, and reactive to light. No foreign bodies found.  Neck: Trachea normal, normal range of motion and phonation normal.  Carotid bruit is not present. No mass and no thyromegaly present.  TTP on left trapezius, no vertebral ttp, decrease lateral ROM, neg Spurling's test. Cardiovascular: Normal rate, regular rhythm, S1 normal, S2 normal, intact distal pulses and normal pulses. Exam reveals no gallop.  No murmur heard.  Pulmonary/Chest: Breath sounds normal. He has no wheezes. He has no rhonchi. He has no rales.  Abdominal: Soft. Normal appearance and bowel sounds  are normal. There is no hepatosplenomegaly. There is no tenderness. There is no rebound, no guarding and no CVA tenderness. No hernia. Hernia confirmed negative in the right inguinal area and confirmed negative in the left inguinal area.  Genitourinary:  Testes normal and penis normal. Rectal exam shows no external hemorrhoid, no internal hemorrhoid, no fissure, no mass, no tenderness and anal tone normal. Guaiac negative stool. Prostate is enlarged. Prostate is not tender. Right testis shows no mass and no tenderness. Left testis shows no mass and no tenderness. No paraphimosis or penile tenderness.  No prostate nodules  Lymphadenopathy:  He has no cervical adenopathy.  Right: No inguinal adenopathy present.  Left: No inguinal adenopathy present.  Neurological: He is alert. He has normal strength and normal reflexes. No cranial nerve deficit or sensory deficit. Gait normal.  Skin: Skin is warm, dry and intact. Dark irregualr lesion noted on right chest, present long time. Psychiatric: He has a normal mood and affect. His speech is normal and behavior is normal. Judgment normal.    Assessment & Plan:   Complete Physical Exam: The patient's preventative maintenance and recommended screening tests for an annual wellness exam were reviewed in full today.  Brought up to date unless services declined.  Counselled on the importance of diet, exercise, and its role in overall health and mortality.  The patient's FH and SH was reviewed, including their home life, tobacco status, and drug and alcohol status.   Colon: great aunt and uncle with colon cancer, no first degree relative. Plans to do iFOB. Vaccines up to date. Consider flu. PSA: stable here (30 % risk per free), no further elevations, biopsy nml in 2011 per uro.

## 2011-11-29 NOTE — Assessment & Plan Note (Signed)
Stable. Continue to follow yearly.

## 2011-11-29 NOTE — Assessment & Plan Note (Signed)
At goal on current meds. Encouraged exercise, weight loss, healthy eating habits.  

## 2011-11-29 NOTE — Assessment & Plan Note (Signed)
Using vicodin in limited manor for flares.

## 2012-01-05 ENCOUNTER — Other Ambulatory Visit: Payer: Self-pay | Admitting: Family Medicine

## 2012-01-24 ENCOUNTER — Encounter: Payer: Self-pay | Admitting: Dermatology

## 2012-01-24 DIAGNOSIS — C439 Malignant melanoma of skin, unspecified: Secondary | ICD-10-CM

## 2012-01-24 HISTORY — DX: Malignant melanoma of skin, unspecified: C43.9

## 2012-02-01 ENCOUNTER — Other Ambulatory Visit: Payer: Self-pay | Admitting: Family Medicine

## 2012-02-03 ENCOUNTER — Other Ambulatory Visit: Payer: Self-pay

## 2012-02-03 MED ORDER — HYDROCODONE-ACETAMINOPHEN 5-500 MG PO TABS: ORAL_TABLET | ORAL | Status: DC

## 2012-02-03 NOTE — Telephone Encounter (Signed)
Pt left v/m requesting Hydrocodone-apap to [facility]; last weekend pt hurt back doing yard work, so pt has taken more than usual due to back pain.pt is going out of country next [place] and needs refill of med.Please advise.

## 2012-02-03 NOTE — Telephone Encounter (Signed)
rx called to pharmacy 

## 2012-02-19 DIAGNOSIS — C439 Malignant melanoma of skin, unspecified: Secondary | ICD-10-CM

## 2012-02-19 HISTORY — DX: Malignant melanoma of skin, unspecified: C43.9

## 2012-02-20 ENCOUNTER — Encounter: Payer: Self-pay | Admitting: Dermatology

## 2012-03-28 ENCOUNTER — Telehealth: Payer: Self-pay | Admitting: Family Medicine

## 2012-03-28 NOTE — Telephone Encounter (Signed)
Patient Information:  Caller Name: Dennis Hicks  Phone: [phone]  Patient: Dennis Hicks, Dennis Hicks  Gender: Male  DOB: 07/28/1955  Age: 57 Years  PCP: Kerby Nora (Family Practice)  Office Follow Up:  Does the office need to follow up with this patient?: Yes  Instructions For The Office: OFFICE, PT REFUSED TO BE SEEN IN THE OFFICE BUT WOULD LIKE MEDICAITON FOR COUGH (CHERATUSSIN AC SYRUP).  HE WOULD ALSO LIKE AN RX OF PCN.  RN ADVISED PT THAT HE WOULD MOST LIKELY NEED TO BE SEEN IN THE OFFICE BUT PT AGAIN STATED HE DID NOT WANT TO DRIVE 30 MINS (SINCE HE IS OUT OF WORK AGAIN TODAY)   Symptoms  Reason For Call & Symptoms: pt reports he has had sore throat , cough.  Pt feels like he has an URI.  Pt does not want to come into the office.  Pt states it is 30 miles away and he does not have anyone that can take him.  Pt states he is going take PCN that he has had left over from 08/01/11.  Pt is calling to request refills of medication, especially the cough medication (Cheratussin AC syrup) RN advised for pt not to take the old medication and to come into the office to be seen but pt refused  Reviewed Health History In EMR: No  Reviewed Medications In EMR: No  Reviewed Allergies In EMR: No  Reviewed Surgeries / Procedures: No  Date of Onset of Symptoms: 03/23/2012  Treatments Tried: decongestants, antihistamines,  Treatments Tried Worked: No  Any Fever: Yes  Fever Taken: Oral  Fever Time Of Reading: 10:00:00  Fever Last Reading: 99  Guideline(s) Used:  Sinus Pain and Congestion  Disposition Per Guideline:   See Today in Office  Reason For Disposition Reached:   Fever present > 3 days (72 hours)  Advice Given:  N/A  Patient Refused Recommendation:  Patient Requests Prescription  Pt refused to be seen in the office.  Pt is requesting medicaiton for cough

## 2012-03-28 NOTE — Telephone Encounter (Signed)
Patient advised and med called to pharmacy

## 2012-03-28 NOTE — Telephone Encounter (Signed)
Pt left v/m pt uses [facility] in [place]. Pt also wants med for respiratory infection as well as cough med. Pt request call back.

## 2012-03-28 NOTE — Telephone Encounter (Signed)
Left message for patient to call and let me know what pharmacy to send to

## 2012-03-28 NOTE — Telephone Encounter (Signed)
Call  i am ok calling in some cough syrup Cheratussin AC, 5 mL po qhs prn cough, 140 mL, 0 refills  Calling in antibiotics against office policy.  I would just follow-up in a few days if not resolving.

## 2012-05-30 ENCOUNTER — Telehealth: Payer: Self-pay

## 2012-05-30 NOTE — Telephone Encounter (Signed)
[facility] faxed prior auth for Viagra. Form in Dr Daphine Deutscher in box.

## 2012-05-31 NOTE — Telephone Encounter (Signed)
Forms given to rena

## 2012-05-31 NOTE — Telephone Encounter (Signed)
Completed forms faxed to [phone].

## 2012-05-31 NOTE — Telephone Encounter (Signed)
Completed and in outbox. 

## 2012-06-12 NOTE — Telephone Encounter (Signed)
Prior auth given for Viagra; letter on Dr Daphine Deutscher desk for signature and scanning.

## 2012-07-05 ENCOUNTER — Other Ambulatory Visit: Payer: Self-pay

## 2012-07-05 MED ORDER — HYDROCODONE-ACETAMINOPHEN 5-500 MG PO TABS: ORAL_TABLET | ORAL | Status: DC

## 2012-07-05 MED ORDER — BETAMETHASONE DIPROPIONATE 0.05 % EX CREA: 1.0000 "application " | TOPICAL_CREAM | Freq: Two times a day (BID) | CUTANEOUS | Status: DC

## 2012-07-05 NOTE — Telephone Encounter (Signed)
Pt left v/m requesting refill hydrocodone apap and betamethasone 0.05% cream to [facility] [place]. Pt will be out of town next week and would like refilled on 07/06/12.Please advise.

## 2012-07-26 ENCOUNTER — Other Ambulatory Visit: Payer: Self-pay | Admitting: *Deleted

## 2012-07-26 NOTE — Telephone Encounter (Signed)
Last filled 12/26/12 

## 2012-07-26 NOTE — Telephone Encounter (Signed)
Controlled substance.. No refills without being seen.

## 2012-07-31 ENCOUNTER — Other Ambulatory Visit: Payer: Self-pay | Admitting: *Deleted

## 2012-11-26 ENCOUNTER — Telehealth: Payer: Self-pay

## 2012-11-26 NOTE — Telephone Encounter (Signed)
Pt left v/m; pt thinks has a chest xray on file that was done at [facility] office. Pt said [facility] is requesting CXR but pt does not want to repeat CXR if one on file.Please advise.

## 2012-11-26 NOTE — Telephone Encounter (Signed)
I looked in our xray storage on site, no films. No record of any xray's since we have been on EMR. Patient notified.

## 2012-12-03 ENCOUNTER — Other Ambulatory Visit: Payer: Self-pay | Admitting: Family Medicine

## 2012-12-04 ENCOUNTER — Other Ambulatory Visit: Payer: Self-pay | Admitting: Family Medicine

## 2012-12-14 ENCOUNTER — Telehealth: Payer: Self-pay

## 2012-12-14 MED ORDER — HYDROCODONE-ACETAMINOPHEN 5-500 MG PO TABS: ORAL_TABLET | ORAL | Status: DC

## 2012-12-14 MED ORDER — HYDROCODONE-ACETAMINOPHEN 5-325 MG PO TABS: ORAL_TABLET | ORAL | Status: DC

## 2012-12-14 MED ORDER — SUMATRIPTAN SUCCINATE 100 MG PO TABS: 100.0000 mg | ORAL_TABLET | ORAL | Status: DC | PRN

## 2012-12-14 NOTE — Addendum Note (Signed)
Addended by: Damita Lack on: 12/14/2012 03:26 PM   Modules accepted: Orders

## 2012-12-14 NOTE — Telephone Encounter (Signed)
Pt left v/m requesting refill hydrocodone apap for back pain and sumatriptan for h/as to [facility].Please advise.

## 2012-12-14 NOTE — Telephone Encounter (Signed)
Called to pharmacy.  Zayvon notified.

## 2012-12-14 NOTE — Telephone Encounter (Signed)
Changed on medication list.

## 2012-12-14 NOTE — Telephone Encounter (Addendum)
Pharmacy called back stating that the Vicodin does not come in 5-500mg  anymore.  Wants to know if ok to change to 5-325mg  which is what patient has gotten in the pass.  Verbal ok given to pharmacist to change to Hydrocodone-acetamenophen 5-325 mg.

## 2013-01-02 ENCOUNTER — Telehealth: Payer: Self-pay | Admitting: Family Medicine

## 2013-01-02 DIAGNOSIS — R972 Elevated prostate specific antigen [PSA]: Secondary | ICD-10-CM

## 2013-01-02 DIAGNOSIS — E7800 Pure hypercholesterolemia, unspecified: Secondary | ICD-10-CM

## 2013-01-02 NOTE — Telephone Encounter (Signed)
Message copied by Excell Seltzer on Wed Jan 02, 2013 11:53 PM ------      Message from: Alvina Chou      Created: Wed Dec 26, 2012  5:24 PM      Regarding: Lab orders for Thursday, 10.16.14       Patient is scheduled for CPX labs, please order future labs, Thanks , Terri       ------

## 2013-01-03 ENCOUNTER — Ambulatory Visit: Payer: Self-pay | Admitting: Dermatology

## 2013-01-03 ENCOUNTER — Other Ambulatory Visit (INDEPENDENT_AMBULATORY_CARE_PROVIDER_SITE_OTHER): Payer: 59

## 2013-01-03 DIAGNOSIS — E7800 Pure hypercholesterolemia, unspecified: Secondary | ICD-10-CM

## 2013-01-03 DIAGNOSIS — I10 Essential (primary) hypertension: Secondary | ICD-10-CM

## 2013-01-03 DIAGNOSIS — Z0000 Encounter for general adult medical examination without abnormal findings: Secondary | ICD-10-CM

## 2013-01-03 DIAGNOSIS — R972 Elevated prostate specific antigen [PSA]: Secondary | ICD-10-CM

## 2013-01-03 LAB — COMPREHENSIVE METABOLIC PANEL
ALT: 43 U/L (ref 0–53)
Albumin: 4 g/dL (ref 3.5–5.2)
Alkaline Phosphatase: 51 U/L (ref 39–117)
BUN: 18 mg/dL (ref 6–23)
CO2: 26 mEq/L (ref 19–32)
GFR: 60.18 mL/min (ref >60.00)
Potassium: 4 mEq/L (ref 3.5–5.1)
Sodium: 138 mEq/L (ref 135–145)
Total Bilirubin: 0.7 mg/dL (ref 0.3–1.2)
Total Protein: 6.9 g/dL (ref 6.0–8.3)

## 2013-01-03 LAB — LDL CHOLESTEROL, DIRECT: Direct LDL: 158.2 mg/dL

## 2013-01-03 LAB — LIPID PANEL
Cholesterol: 214 mg/dL — ABNORMAL HIGH (ref 0–200)
HDL: 40.3 mg/dL (ref >39.00)
Total CHOL/HDL Ratio: 5
VLDL: 25 mg/dL (ref 0.0–40.0)

## 2013-01-04 LAB — PSA, TOTAL AND FREE
PSA, Free Pct: 14 % — ABNORMAL LOW (ref >25)
PSA: 3.09 ng/mL (ref <=4.00)

## 2013-01-07 ENCOUNTER — Encounter: Payer: Self-pay | Admitting: Radiology

## 2013-01-08 ENCOUNTER — Ambulatory Visit (INDEPENDENT_AMBULATORY_CARE_PROVIDER_SITE_OTHER): Payer: 59 | Admitting: Family Medicine

## 2013-01-08 ENCOUNTER — Encounter: Payer: Self-pay | Admitting: Family Medicine

## 2013-01-08 VITALS — BP 100/60 | HR 104 | Temp 98.9°F | Ht 66.25 in | Wt 203.5 lb

## 2013-01-08 DIAGNOSIS — L2089 Other atopic dermatitis: Secondary | ICD-10-CM

## 2013-01-08 DIAGNOSIS — Z1212 Encounter for screening for malignant neoplasm of rectum: Secondary | ICD-10-CM

## 2013-01-08 DIAGNOSIS — R7309 Other abnormal glucose: Secondary | ICD-10-CM

## 2013-01-08 DIAGNOSIS — E0829 Diabetes mellitus due to underlying condition with other diabetic kidney complication: Secondary | ICD-10-CM | POA: Insufficient documentation

## 2013-01-08 DIAGNOSIS — Z0000 Encounter for general adult medical examination without abnormal findings: Secondary | ICD-10-CM

## 2013-01-08 DIAGNOSIS — I10 Essential (primary) hypertension: Secondary | ICD-10-CM

## 2013-01-08 DIAGNOSIS — R972 Elevated prostate specific antigen [PSA]: Secondary | ICD-10-CM

## 2013-01-08 DIAGNOSIS — M549 Dorsalgia, unspecified: Secondary | ICD-10-CM

## 2013-01-08 DIAGNOSIS — M25561 Pain in right knee: Secondary | ICD-10-CM | POA: Insufficient documentation

## 2013-01-08 DIAGNOSIS — E7800 Pure hypercholesterolemia, unspecified: Secondary | ICD-10-CM

## 2013-01-08 DIAGNOSIS — G43009 Migraine without aura, not intractable, without status migrainosus: Secondary | ICD-10-CM

## 2013-01-08 DIAGNOSIS — M25569 Pain in unspecified knee: Secondary | ICD-10-CM

## 2013-01-08 DIAGNOSIS — E1129 Type 2 diabetes mellitus with other diabetic kidney complication: Secondary | ICD-10-CM | POA: Insufficient documentation

## 2013-01-08 DIAGNOSIS — R7303 Prediabetes: Secondary | ICD-10-CM

## 2013-01-08 MED ORDER — SUMATRIPTAN SUCCINATE 100 MG PO TABS: 100.0000 mg | ORAL_TABLET | ORAL | Status: DC | PRN

## 2013-01-08 MED ORDER — BETAMETHASONE DIPROPIONATE 0.05 % EX CREA: 1.0000 "application " | TOPICAL_CREAM | Freq: Two times a day (BID) | CUTANEOUS | Status: DC

## 2013-01-08 NOTE — Patient Instructions (Addendum)
Get back to exercise as able. Decrease sausage, butter, animal fats, work on low fat diet.  Work on weight loss. Work on low [employer]. Stop at lab on way out for stool test. Consider flu and tdap.  Increase simvastatin  To 30 mg daily. Return for cholesterol labs fasting in 3 months. For knee pain.. Start strengthening exercise, start glucosamine OTC joint lubricant. 500 mg 1-3 times a day if pain returns.

## 2013-01-08 NOTE — Assessment & Plan Note (Signed)
Refilled sumatriptan 

## 2013-01-08 NOTE — Assessment & Plan Note (Addendum)
Inadequate control. Encouraged exercise, weight loss, healthy eating habits. Increase simvastatin to 30 mg daily.  Recheck in 3 months.

## 2013-01-08 NOTE — Assessment & Plan Note (Signed)
Well controlled. Continue current medication.  

## 2013-01-08 NOTE — Assessment & Plan Note (Signed)
Refilled steroid cream.

## 2013-01-08 NOTE — Assessment & Plan Note (Signed)
Stable PSA, nml rectal exam.

## 2013-01-08 NOTE — Assessment & Plan Note (Signed)
New diagnosis. Counseled on diet , weight loss and exercise.

## 2013-01-08 NOTE — Progress Notes (Signed)
The patient is here for annual wellness exam and preventative care.   Seen at [facility].. For psoriasis.  Hypertension: Great control today on lotensin  BP Readings from Last 3 Encounters:  01/08/13 100/60  11/29/11 130/74  08/01/11 110/80  Using medication without problems or lightheadedness: None  Chest pain with exertion: None  Edema:None  Short of breath:None  Average home BPs: rarely checking, but few weeks ago 126/82  Other issues: Good eating habits except not able to eat fish given migraines. No exercise except golf.  Wt Readings from Last 3 Encounters:  01/08/13 203 lb 8 oz (92.307 kg)  11/29/11 194 lb 12 oz (88.338 kg)  08/01/11 189 lb (85.73 kg)     Elevated Cholesterol:  LDL no longer at goal <130 on zocor 20 mg daily  Lab Results  Component Value Date   CHOL 214* 01/03/2013   HDL 40.30 01/03/2013   LDLCALC 102* 11/25/2011   LDLDIRECT 158.2 01/03/2013   TRIG 125.0 01/03/2013   CHOLHDL 5 01/03/2013   Using medications without problems:  Muscle aches: None  Other complaints:   Intermittent right knee pain: none now but limits exercise. No redness, no swelling.  No known injury.  Elevated PSA.. 2011 referred to Dr., Garner Nash for increase in PSA over 1 year period 1 up to 4s  Has prostate biopsy 10/2009.. Negative.  Lab Results  Component Value Date   PSA 3.09 01/03/2013   PSA 2.93 11/25/2011   PSA 3.46 10/12/2010   Uses vicodin in spurts for several days every few months, for low back pain and right wrist pain where he has calcium deposit.   Migraine: Last 2-3 days at a time... Using 1 tabs a day on those days. Has migraine every few months. Knows triggers.   Review of Systems  Constitutional: Negative for fever, fatigue and unexpected weight change.  HENT: Negative for ear pain, congestion, sore throat, rhinorrhea, trouble swallowing and postnasal drip.  Eyes: Negative for pain.  Respiratory: Negative for cough, shortness of breath and wheezing.   Cardiovascular: Negative for chest pain, palpitations and leg swelling.  Gastrointestinal: Negative for nausea, abdominal pain, diarrhea, constipation and blood in stool.  Genitourinary: Negative for dysuria, urgency, hematuria, discharge, penile swelling, scrotal swelling, difficulty urinating, penile pain and testicular pain.  Skin: Negative for rash.  Neurological: Negative for syncope, weakness, light-headedness, numbness and headaches.  Psychiatric/Behavioral: Negative for behavioral problems and dysphoric mood. The patient is not nervous/anxious.  Objective:   Physical Exam  Constitutional: He appears well-developed and well-nourished. Non-toxic appearance. He does not appear ill. No distress.  HENT:  Head: Normocephalic and atraumatic.  Right Ear: Hearing, tympanic membrane, external ear and ear canal normal.  Left Ear: Hearing, tympanic membrane, external ear and ear canal normal.  Nose: Nose normal.  Mouth/Throat: Uvula is midline, oropharynx is clear and moist and mucous membranes are normal.  Eyes: Conjunctivae, EOM and lids are normal. Pupils are equal, round, and reactive to light. No foreign bodies found.  Neck: Trachea normal, normal range of motion and phonation normal. Carotid bruit is not present. No mass and no thyromegaly present.  TTP on left trapezius, no vertebral ttp, decrease lateral ROM, neg Spurling's test.  Cardiovascular: Normal rate, regular rhythm, S1 normal, S2 normal, intact distal pulses and normal pulses. Exam reveals no gallop.  No murmur heard.  Pulmonary/Chest: Breath sounds normal. He has no wheezes. He has no rhonchi. He has no rales.  Abdominal: Soft. Normal appearance and bowel sounds are normal. There  is no hepatosplenomegaly. There is no tenderness. There is no rebound, no guarding and no CVA tenderness. No hernia. Hernia confirmed negative in the right inguinal area and confirmed negative in the left inguinal area.  Genitourinary: Testes normal and  penis normal. Rectal exam shows no external hemorrhoid, no internal hemorrhoid, no fissure, no mass, no tenderness and anal tone normal. Guaiac negative stool. Prostate is enlarged. Prostate is not tender. Right testis shows no mass and no tenderness. Left testis shows no mass and no tenderness. No paraphimosis or penile tenderness.  No prostate nodules  Lymphadenopathy:  He has no cervical adenopathy.  Right: No inguinal adenopathy present.  Left: No inguinal adenopathy present.  Neurological: He is alert. He has normal strength and normal reflexes. No cranial nerve deficit or sensory deficit. Gait normal.  Skin: Skin is warm, dry and intact. Dark irregualr lesion noted on right chest, present long time. Psychiatric: He has a normal mood and affect. His speech is normal and behavior is normal. Judgment normal.  Assessment & Plan:   Complete Physical Exam: The patient's preventative maintenance and recommended screening tests for an annual wellness exam were reviewed in full today.  Brought up to date unless services declined.  Counselled on the importance of diet, exercise, and its role in overall health and mortality.  The patient's FH and SH was reviewed, including their home life, tobacco status, and drug and alcohol status.   Colon: great aunt and unlce with colon cancer, no first degree relative. Plans to do iFOB yearly. Did not return last year. Vaccines ? Tdap. Consider flu. PSA: stable here (30 % risk per free), no further elevations, biopsy nml in 2011 per uro. Lab Results  Component Value Date   PSA 3.09 01/03/2013   PSA 2.93 11/25/2011   PSA 3.46 10/12/2010

## 2013-01-08 NOTE — Assessment & Plan Note (Signed)
Likely patellofemoral pain.  Start glucosamine and strengthening exercsies.  Work on getting back  To exercsie.

## 2013-01-08 NOTE — Assessment & Plan Note (Signed)
Refilled vicodin. Send for UDS today.

## 2013-02-02 ENCOUNTER — Other Ambulatory Visit: Payer: Self-pay | Admitting: Family Medicine

## 2013-03-03 ENCOUNTER — Other Ambulatory Visit: Payer: Self-pay | Admitting: Family Medicine

## 2013-03-27 ENCOUNTER — Telehealth: Payer: Self-pay

## 2013-03-27 NOTE — Telephone Encounter (Signed)
Pt left v/m requesting rx hydrocodone apap. Call when ready for pick up.  

## 2013-03-29 ENCOUNTER — Encounter: Payer: Self-pay | Admitting: Radiology

## 2013-03-29 NOTE — Telephone Encounter (Signed)
Left message for patient to return my call.

## 2013-03-29 NOTE — Telephone Encounter (Signed)
Need pt to do UDS and complete controlled sub contract before further repeat narcotic refills. Was supposed to happen after last OV but pt did not go, I am not sure what happened.

## 2013-03-29 NOTE — Telephone Encounter (Signed)
Dennis Hicks notified that he will need to come in first and sign a Controlled Substance Contract and do a UDS prior to having his Hydrocodone APAP refilled.  This was suppose to be done at his last office visit in October but was not done.  Patient is in agreement with plan and will come in for UDS.

## 2013-04-09 NOTE — Telephone Encounter (Signed)
Pt left v/m and wants to know results from urine analysis and pt wants cb when he can get hydrocodone rx.

## 2013-04-10 MED ORDER — HYDROCODONE-ACETAMINOPHEN 5-325 MG PO TABS: ORAL_TABLET | ORAL | Status: DC

## 2013-04-10 MED ORDER — HYDROCODONE-ACETAMINOPHEN 5-325 MG PO TABS
ORAL_TABLET | ORAL | Status: DC
Start: 2013-04-10 — End: 2013-04-10

## 2013-04-10 NOTE — Addendum Note (Signed)
Addended by: Carter Kitten on: 04/10/2013 10:04 AM   Modules accepted: Orders

## 2013-04-10 NOTE — Addendum Note (Signed)
Addended by: Eliezer Lofts E on: 04/10/2013 12:48 AM   Modules accepted: Orders

## 2013-04-10 NOTE — Telephone Encounter (Signed)
Low risk UDS... Okay to refill pain .  will send to donna to call in.

## 2013-04-10 NOTE — Telephone Encounter (Signed)
Medication can not be called in.  Prescription printed and will have Dr. Lorelei Pont sign.

## 2013-04-10 NOTE — Telephone Encounter (Signed)
ok 

## 2013-04-10 NOTE — Telephone Encounter (Signed)
Dyan notified UDS was normal.  Prescription is ready to be picked up at front desk.

## 2013-04-26 ENCOUNTER — Encounter: Payer: Self-pay | Admitting: Family Medicine

## 2013-08-07 ENCOUNTER — Other Ambulatory Visit: Payer: Self-pay | Admitting: Family Medicine

## 2013-08-07 NOTE — Telephone Encounter (Signed)
Last office visit 01/08/2013.  Was told to return in 3 months to recheck fasting labs for cholesterol.  Labs have not been done.  Ok to refill?

## 2013-08-08 NOTE — Telephone Encounter (Signed)
Have pt make appt to recheck labs... Refill until then.

## 2013-08-08 NOTE — Telephone Encounter (Signed)
Spoke with Mr. Elpers.  Advised he needed to come in for fasting cholesterol check before medications can be refilled.  Dennis Hicks states he would rather go ahead and schedule his CPX and get everything done at once.  Advised normally insurance will only cover CPX one year and one day from his previous which was in 12/2012.  He wants to check with his office about that rule and call us back.

## 2013-08-13 ENCOUNTER — Other Ambulatory Visit: Payer: Self-pay

## 2013-08-13 NOTE — Telephone Encounter (Signed)
Pt left v/m requesting rx hydrocodone apap. Call when ready for pick up. Pt left v/m wanting to know if needs to schedule f/u lab appt.

## 2013-08-16 MED ORDER — HYDROCODONE-ACETAMINOPHEN 5-325 MG PO TABS: ORAL_TABLET | ORAL | Status: DC

## 2013-08-16 NOTE — Telephone Encounter (Addendum)
Dennis Hicks notified prescription is ready to be picked up at front desk.  Also advised that he does need to come in for fasting labs to recheck his cholesterol.

## 2013-08-22 ENCOUNTER — Ambulatory Visit (INDEPENDENT_AMBULATORY_CARE_PROVIDER_SITE_OTHER): Payer: 59 | Admitting: Internal Medicine

## 2013-08-22 ENCOUNTER — Encounter: Payer: Self-pay | Admitting: Internal Medicine

## 2013-08-22 VITALS — BP 112/68 | HR 92 | Temp 98.4°F | Wt 210.5 lb

## 2013-08-22 DIAGNOSIS — J069 Acute upper respiratory infection, unspecified: Secondary | ICD-10-CM

## 2013-08-22 MED ORDER — AZITHROMYCIN 250 MG PO TABS: ORAL_TABLET | ORAL | Status: DC

## 2013-08-22 MED ORDER — HYDROCODONE-HOMATROPINE 5-1.5 MG/5ML PO SYRP: 5.0000 mL | ORAL_SOLUTION | Freq: Three times a day (TID) | ORAL | Status: DC | PRN

## 2013-08-22 NOTE — Progress Notes (Signed)
HPI  Pt presents to the clinic today with c/o cough, nasal congestion, chest congestion and sore throat. He reports this started 4 days ago. The cough is productive of thick yellow mucous. He has had fevers but denies chills and body aches. He has tried coricidin, mucinex, tylenol and warm salt water gargles without much relief. He does have a history of allergies but denies breathing problems. He has not had sick contacts that he is aware of. He does not smoke.  Review of Systems      Past Medical History  Diagnosis Date  . Hypertension     Family History  Problem Relation Age of Onset  . Hypertension Mother   . Coronary artery disease Father   . Hypertension Father   . Hyperlipidemia Father   . Diabetes Father     History   Social History  . Marital Status: Married    Spouse Name: N/A    Number of Children: N/A  . Years of Education: N/A   Occupational History  . 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 Smoking status: Never Smoker   . Smokeless tobacco: Never Used  . Alcohol Use: Yes  . Drug Use: No  . Sexual Activity: Not on file   Other Topics Concern  . Not on file   Social History Narrative   Regular exercise--yes    Allergies  Allergen Reactions  . Chocolate Other (See Comments)    Migraine  . Shrimp [Shellfish Allergy] Other (See Comments)    Migraines     Constitutional: Positive headache, fatigue and fever. Denies abrupt weight changes.  HEENT:  Positive nasal congestion, sore throat. Denies eye redness, eye pain, pressure behind the eyes, facial pain, nasal congestion, ear pain, ringing in the ears, wax buildup, runny nose or bloody nose. Respiratory: Positive cough. Denies difficulty breathing or shortness of breath.  Cardiovascular: Denies chest pain, chest tightness, palpitations or swelling in the hands or feet.   No other specific complaints in a complete review of systems (except as listed in HPI  above).  Objective:   BP 112/68  Pulse 92  Temp(Src) 98.4 F (36.9 C) (Oral)  Wt 210 lb 8 oz (95.482 kg)  SpO2 98%  Wt Readings from Last 3 Encounters:  08/22/13 210 lb 8 oz (95.482 kg)  01/08/13 203 lb 8 oz (92.307 kg)  11/29/11 194 lb 12 oz (88.338 kg)     General: Appears his stated age, well developed, well nourished in NAD. HEENT: Head: normal shape and size; Eyes: sclera white, no icterus, conjunctiva pink, PERRLA and EOMs intact; Ears: Tm's gray and intact, normal light reflex, + effusion bilaterally; Nose: mucosa pink and moist, septum midline; Throat/Mouth: + PND. Teeth present, mucosa erythematous and moist, no exudate noted, no lesions or ulcerations noted.  Neck: Mild cervical lymphadenopathy. Neck supple, trachea midline. No massses, lumps or thyromegaly present.  Cardiovascular: Normal rate and rhythm. S1,S2 noted.  No murmur, rubs or gallops noted. No JVD or BLE edema. No carotid bruits noted. Pulmonary/Chest: Normal effort and positive vesicular breath sounds. No respiratory distress. No wheezes, rales or ronchi noted.      Assessment & Plan:   Upper Respiratory Infection:  Get some rest and drink plenty of water Do salt water gargles for the sore throat eRx for Azithromax x 5 days eRx for Hycodan cough syrup  RTC as needed or if symptoms persist.

## 2013-08-22 NOTE — Progress Notes (Signed)
Pre visit review using our clinic review tool, if applicable. No additional management support is needed unless otherwise documented below in the visit note. 

## 2013-08-22 NOTE — Patient Instructions (Addendum)

## 2013-08-27 ENCOUNTER — Telehealth: Payer: Self-pay

## 2013-08-27 DIAGNOSIS — J069 Acute upper respiratory infection, unspecified: Secondary | ICD-10-CM

## 2013-08-27 MED ORDER — HYDROCODONE-HOMATROPINE 5-1.5 MG/5ML PO SYRP: 5.0000 mL | ORAL_SOLUTION | Freq: Three times a day (TID) | ORAL | Status: DC | PRN

## 2013-08-27 NOTE — Telephone Encounter (Signed)
Pt is aware--Rx for cough syrup will be placed in front office for him to pick up and he will call back if he does not have any improvement in Sx by Friday--

## 2013-08-27 NOTE — Telephone Encounter (Signed)
The antibiotic will still be in his system for 3-4 more days. He needs to try to ride it out. Ok to refill cough syrup. If no improvement by Friday, we will consider different antibiotic

## 2013-08-27 NOTE — Telephone Encounter (Signed)
Pt was sen 08/22/13 has finished zpak and has enough hycodan for today; cough is better but still has prod cough with brownish phlegm;head congestion and when blows nose still greenish brown mucus; no fever or S/T now. Pt wanted to know if could get antibiotic and cough med to completely get rid of cough and congestion.  [facility] pt request cb.

## 2013-08-30 ENCOUNTER — Other Ambulatory Visit: Payer: Self-pay | Admitting: Internal Medicine

## 2013-08-30 MED ORDER — AMOXICILLIN 500 MG PO CAPS: 500.0000 mg | ORAL_CAPSULE | Freq: Three times a day (TID) | ORAL | Status: DC

## 2013-08-30 NOTE — Telephone Encounter (Signed)
Amoxil sent to pharmacy, if no improvement will need follow up

## 2013-08-30 NOTE — Telephone Encounter (Signed)
Pt is aware as instructed 

## 2013-08-30 NOTE — Telephone Encounter (Signed)
Pt left v/m; cough has improved slightly but is not completely better. Pt request antibiotic to [facility]. Pt request cb.

## 2013-09-04 ENCOUNTER — Other Ambulatory Visit (INDEPENDENT_AMBULATORY_CARE_PROVIDER_SITE_OTHER): Payer: 59

## 2013-09-04 ENCOUNTER — Encounter: Payer: Self-pay | Admitting: Family Medicine

## 2013-09-04 ENCOUNTER — Telehealth: Payer: Self-pay | Admitting: Family Medicine

## 2013-09-04 DIAGNOSIS — E7800 Pure hypercholesterolemia, unspecified: Secondary | ICD-10-CM

## 2013-09-04 DIAGNOSIS — I10 Essential (primary) hypertension: Secondary | ICD-10-CM

## 2013-09-04 LAB — LIPID PANEL
CHOLESTEROL: 181 mg/dL (ref 0–200)
HDL: 36 mg/dL — AB (ref >39.00)
LDL CALC: 118 mg/dL — AB (ref 0–99)
NonHDL: 145
TRIGLYCERIDES: 134 mg/dL (ref 0.0–149.0)
Total CHOL/HDL Ratio: 5
VLDL: 26.8 mg/dL (ref 0.0–40.0)

## 2013-09-04 LAB — COMPREHENSIVE METABOLIC PANEL
ALT: 40 U/L (ref 0–53)
AST: 31 U/L (ref 0–37)
Albumin: 4.3 g/dL (ref 3.5–5.2)
Alkaline Phosphatase: 48 U/L (ref 39–117)
BUN: 17 mg/dL (ref 6–23)
CO2: 28 meq/L (ref 19–32)
CREATININE: 1.4 mg/dL (ref 0.4–1.5)
Calcium: 9.7 mg/dL (ref 8.4–10.5)
Chloride: 100 mEq/L (ref 96–112)
GFR: 57.01 mL/min — AB (ref >60.00)
GLUCOSE: 93 mg/dL (ref 70–99)
Potassium: 3.7 mEq/L (ref 3.5–5.1)
Sodium: 136 mEq/L (ref 135–145)
Total Bilirubin: 0.4 mg/dL (ref 0.2–1.2)
Total Protein: 7.2 g/dL (ref 6.0–8.3)

## 2013-09-04 NOTE — Telephone Encounter (Signed)
Relevant patient education mailed to patient.  

## 2013-09-05 ENCOUNTER — Encounter: Payer: Self-pay | Admitting: *Deleted

## 2013-11-28 ENCOUNTER — Telehealth: Payer: Self-pay

## 2013-11-28 NOTE — Telephone Encounter (Signed)
Pt request refill of BP med, cholesterol med and hydrocodone. Pt has enough med to last 2 weeks; pt scheduled appt on 11/29/13 to f/u labs as instructed from 08/2013 result note and to get meds refilled.

## 2013-11-29 ENCOUNTER — Ambulatory Visit (INDEPENDENT_AMBULATORY_CARE_PROVIDER_SITE_OTHER): Payer: 59 | Admitting: Family Medicine

## 2013-11-29 ENCOUNTER — Encounter: Payer: Self-pay | Admitting: Family Medicine

## 2013-11-29 ENCOUNTER — Ambulatory Visit (INDEPENDENT_AMBULATORY_CARE_PROVIDER_SITE_OTHER)
Admission: RE | Admit: 2013-11-29 | Discharge: 2013-11-29 | Disposition: A | Discharge status: Home / Self Care | Payer: 59 | Source: Ambulatory Visit | Attending: Family Medicine | Admitting: Family Medicine

## 2013-11-29 VITALS — BP 124/80 | HR 87 | Temp 98.1°F | Ht 66.25 in | Wt 209.2 lb

## 2013-11-29 DIAGNOSIS — N183 Chronic kidney disease, stage 3 unspecified: Secondary | ICD-10-CM | POA: Insufficient documentation

## 2013-11-29 DIAGNOSIS — E1122 Type 2 diabetes mellitus with diabetic chronic kidney disease: Secondary | ICD-10-CM | POA: Insufficient documentation

## 2013-11-29 DIAGNOSIS — G8929 Other chronic pain: Secondary | ICD-10-CM

## 2013-11-29 DIAGNOSIS — M25561 Pain in right knee: Secondary | ICD-10-CM

## 2013-11-29 DIAGNOSIS — I10 Essential (primary) hypertension: Secondary | ICD-10-CM

## 2013-11-29 DIAGNOSIS — N181 Chronic kidney disease, stage 1: Secondary | ICD-10-CM | POA: Insufficient documentation

## 2013-11-29 DIAGNOSIS — M25519 Pain in unspecified shoulder: Secondary | ICD-10-CM

## 2013-11-29 DIAGNOSIS — E7800 Pure hypercholesterolemia, unspecified: Secondary | ICD-10-CM

## 2013-11-29 DIAGNOSIS — M25511 Pain in right shoulder: Secondary | ICD-10-CM

## 2013-11-29 DIAGNOSIS — R7309 Other abnormal glucose: Secondary | ICD-10-CM

## 2013-11-29 DIAGNOSIS — N289 Disorder of kidney and ureter, unspecified: Secondary | ICD-10-CM

## 2013-11-29 DIAGNOSIS — M25569 Pain in unspecified knee: Secondary | ICD-10-CM

## 2013-11-29 DIAGNOSIS — R7303 Prediabetes: Secondary | ICD-10-CM

## 2013-11-29 MED ORDER — HYDROCODONE-ACETAMINOPHEN 5-325 MG PO TABS: ORAL_TABLET | ORAL | Status: DC

## 2013-11-29 MED ORDER — BETAMETHASONE DIPROPIONATE 0.05 % EX CREA: 1.0000 "application " | TOPICAL_CREAM | Freq: Two times a day (BID) | CUTANEOUS | Status: DC

## 2013-11-29 MED ORDER — SIMVASTATIN 20 MG PO TABS
20.0000 mg | ORAL_TABLET | Freq: Every day | ORAL | Status: DC
Start: 2013-11-29 — End: 2014-10-30

## 2013-11-29 MED ORDER — BENAZEPRIL-HYDROCHLOROTHIAZIDE 20-25 MG PO TABS: ORAL_TABLET | ORAL | Status: DC

## 2013-11-29 NOTE — Assessment & Plan Note (Signed)
Posterior pain likely from arthritis.  Will eval with X-ray.  Unclear what is sliding in front of knee.  This is keeping him from exercise. Refer to sports medicine.

## 2013-11-29 NOTE — Assessment & Plan Note (Signed)
Well controlled. Continue current medication.  

## 2013-11-29 NOTE — Assessment & Plan Note (Signed)
Improved at last check.

## 2013-11-29 NOTE — Progress Notes (Signed)
   Subjective:    Patient ID: Dennis Hicks, male    DOB: May 13, 1955, 58 y.o.   MRN: [id number]  HPI  58 year old male presents for med refill.   Hypertension: Great control today on lotensin  BP Readings from Last 3 Encounters:  11/29/13 124/80  08/22/13 112/68  01/08/13 100/60  Using medication without problems or lightheadedness: None  Chest pain with exertion: None  Edema:None  Short of breath:None  Average home BPs: rarely checking No exercise except golf.  Wt Readings from Last 3 Encounters:  11/29/13 209 lb 4 oz (94.915 kg)  08/22/13 210 lb 8 oz (95.482 kg)  01/08/13 203 lb 8 oz (92.307 kg)    Elevated Cholesterol: LDL now  at goal <130 on zocor 20 mg daily  Lab Results  Component Value Date   CHOL 181 09/04/2013   HDL 36.00* 09/04/2013   LDLCALC 118* 09/04/2013   LDLDIRECT 158.2 01/03/2013   TRIG 134.0 09/04/2013   CHOLHDL 5 09/04/2013  Using medications without problems:  Muscle aches: None  Other complaints:  He is unable to exercise due to right knee pain.   In 08/2013 he had elevation of Cr. Never followed up. He has been using aleve for knee and shoulder pain. He does not drink much water, lots of caffeine. He was eating a high protein diet.  Over last 3 years  Cr has trended up  Yearly from 1.1.  He has been having right knee pain, anterior , mainly posteriorly, swollen in back.  Has been going on for 1 year. Started after going up and down stairs a lot.  No clicking or popping. No fall, no known injury.  He feels something moving rolling across knee.  Aleve has not helped much. When he trys to work out he has pain.  Using hydrocodone as needed for pain in shoulder, back and knee. Needs refills.    Review of Systems  Constitutional: Negative for fatigue.  Eyes: Negative for pain.  Respiratory: Negative for cough and shortness of breath.   Cardiovascular: Negative for chest pain.  Gastrointestinal: Negative for abdominal pain.       Objective:   Physical Exam  Constitutional: Vital signs are normal. He appears well-developed and well-nourished.  HENT:  Head: Normocephalic.  Right Ear: Hearing normal.  Left Ear: Hearing normal.  Nose: Nose normal.  Mouth/Throat: Oropharynx is clear and moist and mucous membranes are normal.  Neck: Trachea normal. Carotid bruit is not present. No mass and no thyromegaly present.  Cardiovascular: Normal rate, regular rhythm and normal pulses.  Exam reveals no gallop, no distant heart sounds and no friction rub.   No murmur heard. No peripheral edema  Pulmonary/Chest: Effort normal and breath sounds normal. No respiratory distress.  Musculoskeletal:       Right knee: He exhibits normal range of motion, no swelling and normal meniscus. Tenderness found. No medial joint line, no lateral joint line, no MCL, no LCL and no patellar tendon tenderness noted.  ttp over posterior joint line.  No popping of ? Tendon/object above knee cap noted on exam today  Skin: Skin is warm, dry and intact. No rash noted.  Psychiatric: He has a normal mood and affect. His speech is normal and behavior is normal. Thought content normal.          Assessment & Plan:

## 2013-11-29 NOTE — Patient Instructions (Addendum)
Get back on track with healthy eating. Stop at lab on way. We will call with X-ray results. Schedule CPX in 12/2013 as planned with fasting labs prior  Stop at front desk for referral to Dr. Lorelei Pont for right knee pain.

## 2013-11-29 NOTE — Assessment & Plan Note (Signed)
Resolved

## 2013-11-29 NOTE — Progress Notes (Signed)
Pre visit review using our clinic review tool, if applicable. No additional management support is needed unless otherwise documented below in the visit note. 

## 2013-11-29 NOTE — Assessment & Plan Note (Signed)
?   Due to NSAIDs,  ACEI, mild dehydration.  Re-eval today.

## 2013-11-29 NOTE — Assessment & Plan Note (Signed)
Told rotator cuff in past. Using hydrocodone prn pain.

## 2013-11-30 LAB — BASIC METABOLIC PANEL
BUN: 15 mg/dL (ref 6–23)
CO2: 30 mEq/L (ref 19–32)
CREATININE: 1.36 mg/dL — AB (ref 0.50–1.35)
Calcium: 9.2 mg/dL (ref 8.4–10.5)
Chloride: 97 mEq/L (ref 96–112)
Glucose, Bld: 77 mg/dL (ref 70–99)
Potassium: 4.2 mEq/L (ref 3.5–5.3)
SODIUM: 135 meq/L (ref 135–145)

## 2013-12-11 ENCOUNTER — Encounter: Payer: Self-pay | Admitting: Family Medicine

## 2013-12-11 ENCOUNTER — Ambulatory Visit (INDEPENDENT_AMBULATORY_CARE_PROVIDER_SITE_OTHER): Payer: 59 | Admitting: Family Medicine

## 2013-12-11 VITALS — BP 120/80 | HR 66 | Temp 98.1°F | Ht 66.25 in | Wt 209.5 lb

## 2013-12-11 DIAGNOSIS — M1711 Unilateral primary osteoarthritis, right knee: Secondary | ICD-10-CM

## 2013-12-11 DIAGNOSIS — M1710 Unilateral primary osteoarthritis, unspecified knee: Secondary | ICD-10-CM

## 2013-12-11 DIAGNOSIS — M6750 Plica syndrome, unspecified knee: Secondary | ICD-10-CM

## 2013-12-11 DIAGNOSIS — M6751 Plica syndrome, right knee: Secondary | ICD-10-CM

## 2013-12-11 NOTE — Progress Notes (Signed)
Pre visit review using our clinic review tool, if applicable. No additional management support is needed unless otherwise documented below in the visit note. 

## 2013-12-11 NOTE — Progress Notes (Signed)
Dr. Frederico Hamman T. Ryley Teater, MD, [facility] [place], [address] Phone: [phone] Fax: [phone]  12/11/2013  Patient: Dennis Hicks, MRN: [id number], DOB: January 01, 1956, 58 y.o.  Primary Physician:  Eliezer Lofts, MD  Chief Complaint: Knee Pain  Subjective:   Dear Dr. Diona Browner,  Thank you for having me see Dennis Hicks in consultation today at [facility] at [facility] for his problem with R knee pain.  As you may recall, he is a 58 y.o. year old male with a history of right sided knee pain that has been ongoing intermittently for one year. Last summer it started bothering him, and it has been bothering him throughout the year. In the last 2 months or so he is started to feel something popping medially underneath his kneecap which will start to be an aggravation and irritation. He also has occasionally had a fusion, which causes him to have pain when he is in deep flexion.  Right now his knee is not really hurting in that severely.   Has quit working out and saw Dr. B last week.   He cannot take NSAIDS   Past Medical History  Diagnosis Date  . Hypertension    No past surgical history on file. History   Social History  . Marital Status: Married    Spouse Name: N/A    Number of Children: N/A  . Years of Education: N/A   Occupational History  . 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 Smoking status: Never Smoker   . Smokeless tobacco: Never Used  . Alcohol Use: Yes  . Drug Use: No  . Sexual Activity: None   Other Topics Concern  . None   Social History Narrative   Regular exercise--yes   Family History  Problem Relation Age of Onset  . Hypertension Mother   . Coronary artery disease Father   . Hypertension Father   . Hyperlipidemia Father   . Diabetes Father     Medications and Allergies reviewed   GEN: No fevers, chills. Nontoxic. Primarily MSK c/o today. MSK:  Detailed in the HPI GI: tolerating PO intake without difficulty Neuro: No numbness, parasthesias, or tingling associated. Otherwise the pertinent positives of the ROS are noted above.   Objective:   BP 120/80  Pulse 66  Temp(Src) 98.1 F (36.7 C) (Oral)  Ht 5' 6.25" (1.683 m)  Wt 209 lb 8 oz (95.029 kg)  BMI 33.55 kg/m2    GEN: WDWN, NAD, Non-toxic, Alert & Oriented x 3 HEENT: Atraumatic, Normocephalic.  Ears and Nose: No external deformity. EXTR: No clubbing/cyanosis/edema NEURO: Normal gait.  PSYCH: Normally interactive. Conversant. Not depressed or anxious appearing.  Calm demeanor.   Knee:  R Gait: Normal heel toe pattern ROM: 0-130 Effusion: neg Echymosis or edema: none Patellar tendon NT Painful PLICA: MEDIALLY AND INFERIORLY Patellar grind: negative Medial and lateral patellar facet loading: negative medial and lateral joint lines: mild medial joint line tenderness. Mcmurray's neg Flexion-pinch neg Varus and valgus stress: stable Lachman: neg Ant and Post drawer: neg Hip abduction, IR, ER: WNL Hip flexion str: 5/5 Hip abd: 5/5 Quad: 5/5 VMO atrophy:No Hamstring concentric and eccentric: 5/5   Radiology:  Dg Knee Complete 4 Views Right  11/29/2013   CLINICAL DATA:  Right knee pain  EXAM: RIGHT KNEE - COMPLETE 4+ VIEW  COMPARISON:  None.  FINDINGS: Spurring at the quadriceps insertion site. Mild patellar  spurring. Articular space narrowing in the medial patellofemoral joint on the sunrise patellar view. Slightly bulbous appearance of the proximal fibular could be due to an old fracture but certainly does not appear acute/ active.  IMPRESSION: 1. Degenerative patellofemoral arthropathy with medial patellofemoral articular space narrowing. 2. Spurring along the patellar articular surface and at the quadriceps attachment site.   Electronically Signed   By: Sherryl Barters M.D.   On: 11/29/2013 16:32    Assessment and Plan:   Plica syndrome of knee,  right  Primary osteoarthritis of right knee  Plan: Discuss potential conservative versus alternative plans. I recommended that the patient do conservative treatment with plica syndrome including massage, ice massage, and alternative activities for exercising. He has an elliptical machine at home, but more preferentially he could use an upright bicycle, an ergometer, or an arc trainer. Nevertheless, I think he is fine to return to sport with minimal risk. Knee exam relatively benign - one risk might be an undiagnosed meniscal tear, but I think that is small.  Mild medial compartmental OA: work on weight loss, fitness, quad strength, Tylenol and ice prn.  We will see the patient back prn.   Thank you for having [place] see Dennis Hicks in consultation.  Feel free to contact me with any questions.  Signed,  Maud Deed. Channin Agustin, MD   Patient's Medications  New Prescriptions   No medications on file  Previous Medications   BENAZEPRIL-HYDROCHLORTHIAZIDE (LOTENSIN HCT) 20-25 MG PER TABLET    TAKE 1 TABLET DAILY   BETAMETHASONE DIPROPIONATE (DIPROLENE) 0.05 % CREAM    Apply 1 application topically 2 (two) times daily.   FAMOTIDINE (PEPCID AC) 10 MG CHEWABLE TABLET    Chew 10 mg by mouth at bedtime as needed.   HYDROCODONE-ACETAMINOPHEN (NORCO/VICODIN) 5-325 MG PER TABLET    Take one tablet every 4 to 6 hours as needed for pain.   SIMVASTATIN (ZOCOR) 20 MG TABLET    Take 1 tablet (20 mg total) by mouth daily.   SUMATRIPTAN (IMITREX) 100 MG TABLET    Take 1 tablet (100 mg total) by mouth every 2 (two) hours as needed.   VIAGRA 100 MG TABLET    TAKE 1 TABLET AS NEEDED FOR ERECTILE DYSFUNCTION  Modified Medications   No medications on file  Discontinued Medications   No medications on file

## 2013-12-16 ENCOUNTER — Other Ambulatory Visit: Payer: Self-pay | Admitting: Family Medicine

## 2014-01-27 ENCOUNTER — Other Ambulatory Visit: Payer: Self-pay | Admitting: Family Medicine

## 2014-02-28 ENCOUNTER — Telehealth: Payer: Self-pay | Admitting: Family Medicine

## 2014-02-28 ENCOUNTER — Other Ambulatory Visit: Payer: 59

## 2014-02-28 DIAGNOSIS — E7800 Pure hypercholesterolemia, unspecified: Secondary | ICD-10-CM

## 2014-02-28 DIAGNOSIS — Z125 Encounter for screening for malignant neoplasm of prostate: Secondary | ICD-10-CM

## 2014-02-28 DIAGNOSIS — R7303 Prediabetes: Secondary | ICD-10-CM

## 2014-02-28 NOTE — Telephone Encounter (Signed)
-----   Message from Ellamae Sia sent at 02/19/2014  3:05 PM EST ----- Regarding: Lab orders for Friday, 12.11.15 Patient is scheduled for CPX labs, please order future labs, Thanks , Karna Christmas

## 2014-03-03 ENCOUNTER — Telehealth: Payer: Self-pay | Admitting: Family Medicine

## 2014-03-03 ENCOUNTER — Other Ambulatory Visit (INDEPENDENT_AMBULATORY_CARE_PROVIDER_SITE_OTHER): Payer: 59

## 2014-03-03 DIAGNOSIS — E7800 Pure hypercholesterolemia, unspecified: Secondary | ICD-10-CM

## 2014-03-03 DIAGNOSIS — R7309 Other abnormal glucose: Secondary | ICD-10-CM

## 2014-03-03 DIAGNOSIS — R7303 Prediabetes: Secondary | ICD-10-CM

## 2014-03-03 DIAGNOSIS — Z125 Encounter for screening for malignant neoplasm of prostate: Secondary | ICD-10-CM

## 2014-03-03 LAB — LIPID PANEL
Cholesterol: 188 mg/dL (ref 0–200)
HDL: 31.8 mg/dL — ABNORMAL LOW (ref >39.00)
LDL Cholesterol: 129 mg/dL — ABNORMAL HIGH (ref 0–99)
NonHDL: 156.2
Total CHOL/HDL Ratio: 6
Triglycerides: 138 mg/dL (ref 0.0–149.0)
VLDL: 27.6 mg/dL (ref 0.0–40.0)

## 2014-03-03 LAB — COMPREHENSIVE METABOLIC PANEL
ALBUMIN: 4 g/dL (ref 3.5–5.2)
ALT: 29 U/L (ref 0–53)
AST: 27 U/L (ref 0–37)
Alkaline Phosphatase: 55 U/L (ref 39–117)
BUN: 22 mg/dL (ref 6–23)
CALCIUM: 9.4 mg/dL (ref 8.4–10.5)
CHLORIDE: 101 meq/L (ref 96–112)
CO2: 26 meq/L (ref 19–32)
Creatinine, Ser: 1.2 mg/dL (ref 0.4–1.5)
GFR: 67.61 mL/min (ref >60.00)
Glucose, Bld: 113 mg/dL — ABNORMAL HIGH (ref 70–99)
POTASSIUM: 3.6 meq/L (ref 3.5–5.1)
Sodium: 134 mEq/L — ABNORMAL LOW (ref 135–145)
Total Bilirubin: 0.6 mg/dL (ref 0.2–1.2)
Total Protein: 6.7 g/dL (ref 6.0–8.3)

## 2014-03-03 LAB — PSA: PSA: 3.76 ng/mL (ref 0.10–4.00)

## 2014-03-03 LAB — HEMOGLOBIN A1C: Hgb A1c MFr Bld: 6.6 % — ABNORMAL HIGH (ref 4.6–6.5)

## 2014-03-03 NOTE — Telephone Encounter (Signed)
-----   Message from Ellamae Sia sent at 02/26/2014  4:58 PM EST ----- Regarding: Lab orders for Monday,12.14.15 Patient is scheduled for CPX labs, please order future labs, Thanks , Karna Christmas

## 2014-03-04 ENCOUNTER — Ambulatory Visit (INDEPENDENT_AMBULATORY_CARE_PROVIDER_SITE_OTHER): Payer: 59 | Admitting: Family Medicine

## 2014-03-04 ENCOUNTER — Encounter: Payer: Self-pay | Admitting: Family Medicine

## 2014-03-04 VITALS — BP 112/76 | HR 96 | Temp 98.4°F | Ht 66.5 in | Wt 212.2 lb

## 2014-03-04 DIAGNOSIS — Z0000 Encounter for general adult medical examination without abnormal findings: Secondary | ICD-10-CM

## 2014-03-04 DIAGNOSIS — Z1211 Encounter for screening for malignant neoplasm of colon: Secondary | ICD-10-CM

## 2014-03-04 DIAGNOSIS — I10 Essential (primary) hypertension: Secondary | ICD-10-CM

## 2014-03-04 DIAGNOSIS — E7800 Pure hypercholesterolemia, unspecified: Secondary | ICD-10-CM

## 2014-03-04 DIAGNOSIS — R7303 Prediabetes: Secondary | ICD-10-CM

## 2014-03-04 DIAGNOSIS — N289 Disorder of kidney and ureter, unspecified: Secondary | ICD-10-CM

## 2014-03-04 NOTE — Patient Instructions (Addendum)
Cut back on carbohydrates. Exercise as tolerated (consider water exercise). Stop at lab on way put for stool test.

## 2014-03-04 NOTE — Assessment & Plan Note (Signed)
IMproved . NSAIDS contraindicated.

## 2014-03-04 NOTE — Progress Notes (Signed)
Pre visit review using our clinic review tool, if applicable. No additional management support is needed unless otherwise documented below in the visit note. 

## 2014-03-04 NOTE — Assessment & Plan Note (Signed)
Well controlled. Continue current medication.  

## 2014-03-04 NOTE — Progress Notes (Signed)
The patient is here for annual wellness exam and preventative care.   Seen at [facility] For psoriasis.  Hypertension: Great control today on lotensin  BP Readings from Last 3 Encounters:  03/04/14 112/76  12/11/13 120/80  11/29/13 124/80  Using medication without problems or lightheadedness: None  Chest pain with exertion: None  Edema:None  Short of breath:None  Average home BPs: rarely checking, but few weeks ago 126/82  Other issues: Good eating habits except not able to eat fish given migraines. No exercise except golf.  Wt Readings from Last 3 Encounters:  03/04/14 212 lb 4 oz (96.276 kg)  12/11/13 209 lb 8 oz (95.029 kg)  11/29/13 209 lb 4 oz (94.915 kg)    Elevated Cholesterol: LDL at goal <130 on zocor 20 mg daily  Lab Results  Component Value Date   CHOL 188 03/03/2014   HDL 31.80* 03/03/2014   LDLCALC 129* 03/03/2014   LDLDIRECT 158.2 01/03/2013   TRIG 138.0 03/03/2014   CHOLHDL 6 03/03/2014  Using medications without problems:  Muscle aches: None  Other complaints:   Elevated PSA.. 2011 referred to Dr., Olena Heckle for increase in PSA over 1 year period 1 up to 4s  Has prostate biopsy 10/2009.. Negative.  Stable levels this year. Lab Results  Component Value Date   PSA 3.76 03/03/2014   PSA 3.09 01/03/2013   PSA 2.93 11/25/2011   Uses vicodin in spurts for several days every few months, for low back pain and right wrist pain and or right knee pain. where he has calcium deposit.   Migraine: Last 2-3 days at a time... Using 1 tabs a day on those days. Has migraine every few months. Knows triggers.  Prediabetes, borderline diabetes: worsening.  Renal insufficiency: NSAIDs. Had SE to NSAIDs in past.   Review of Systems  Constitutional: Negative for fever, fatigue and unexpected weight change.  HENT: Negative for ear pain, congestion, sore throat, rhinorrhea, trouble swallowing and postnasal drip.  Eyes: Negative for pain.  Respiratory: Negative  for cough, shortness of breath and wheezing.  Cardiovascular: Negative for chest pain, palpitations and leg swelling.  Gastrointestinal: Negative for nausea, abdominal pain, diarrhea, constipation and blood in stool.  Genitourinary: Negative for dysuria, urgency, hematuria, discharge, penile swelling, scrotal swelling, difficulty urinating, penile pain and testicular pain.  Skin: Negative for rash.  Neurological: Negative for syncope, weakness, light-headedness, numbness and headaches.  Psychiatric/Behavioral: Negative for behavioral problems and dysphoric mood. The patient is not nervous/anxious.  Objective:   Physical Exam  Constitutional: He appears well-developed and well-nourished. Non-toxic appearance. He does not appear ill. No distress.  HENT:  Head: Normocephalic and atraumatic.  Right Ear: Hearing, tympanic membrane, external ear and ear canal normal.  Left Ear: Hearing, tympanic membrane, external ear and ear canal normal.  Nose: Nose normal.  Mouth/Throat: Uvula is midline, oropharynx is clear and moist and mucous membranes are normal.  Eyes: Conjunctivae, EOM and lids are normal. Pupils are equal, round, and reactive to light. No foreign bodies found.  Neck: Trachea normal, normal range of motion and phonation normal. Carotid bruit is not present. No mass and no thyromegaly present.  TTP on left trapezius, no vertebral ttp, decrease lateral ROM, neg Spurling's test.  Cardiovascular: Normal rate, regular rhythm, S1 normal, S2 normal, intact distal pulses and normal pulses. Exam reveals no gallop.  No murmur heard.  Pulmonary/Chest: Breath sounds normal. He has no wheezes. He has no rhonchi. He has no rales.  Abdominal: Soft. Normal appearance and bowel sounds  are normal. There is no hepatosplenomegaly. There is no tenderness. There is no rebound, no guarding and no CVA tenderness. No hernia. Hernia confirmed negative in the right inguinal area and confirmed  negative in the left inguinal area.  Genitourinary: Testes normal and penis normal. Rectal exam shows no external hemorrhoid, no internal hemorrhoid, no fissure, no mass, no tenderness and anal tone normal. Guaiac negative stool. Prostate is enlarged. Prostate is not tender. Right testis shows no mass and no tenderness. Left testis shows no mass and no tenderness. No paraphimosis or penile tenderness.  No prostate nodules  Lymphadenopathy:  He has no cervical adenopathy.  Right: No inguinal adenopathy present.  Left: No inguinal adenopathy present.  Neurological: He is alert. He has normal strength and normal reflexes. No cranial nerve deficit or sensory deficit. Gait normal.  Skin: Skin is warm, dry and intact. Dark irregualr lesion noted on right chest, present long time. Psychiatric: He has a normal mood and affect. His speech is normal and behavior is normal. Judgment normal.  Assessment & Plan:   Complete Physical Exam: The patient's preventative maintenance and recommended screening tests for an annual wellness exam were reviewed in full today.  Brought up to date unless services declined.  Counselled on the importance of diet, exercise, and its role in overall health and mortality.  The patient's FH and SH was reviewed, including their home life, tobacco status, and drug and alcohol status.   Colon: great aunt and unlce with colon cancer, no first degree relative. Plans to do iFOB yearly. Vaccines refused Tdap and flu. PSA: stable here (30 % risk per free), no further elevations, biopsy nml in 2011 per uro. Lab Results  Component Value Date   PSA 3.76 03/03/2014   PSA 3.09 01/03/2013   PSA 2.93 11/25/2011

## 2014-03-04 NOTE — Assessment & Plan Note (Signed)
Encouraged exercise, weight loss, healthy eating habits.. INfo given on low carb diet.

## 2014-03-05 ENCOUNTER — Telehealth: Payer: Self-pay | Admitting: Family Medicine

## 2014-03-05 NOTE — Telephone Encounter (Signed)
emmi emailed °

## 2014-07-24 ENCOUNTER — Other Ambulatory Visit: Payer: Self-pay | Admitting: Family Medicine

## 2014-10-17 ENCOUNTER — Ambulatory Visit (INDEPENDENT_AMBULATORY_CARE_PROVIDER_SITE_OTHER): Payer: 59 | Admitting: Internal Medicine

## 2014-10-17 ENCOUNTER — Encounter: Payer: Self-pay | Admitting: Internal Medicine

## 2014-10-17 VITALS — BP 132/70 | HR 100 | Temp 98.5°F | Wt 220.8 lb

## 2014-10-17 DIAGNOSIS — J069 Acute upper respiratory infection, unspecified: Secondary | ICD-10-CM

## 2014-10-17 DIAGNOSIS — B9789 Other viral agents as the cause of diseases classified elsewhere: Principal | ICD-10-CM

## 2014-10-17 MED ORDER — AZITHROMYCIN 250 MG PO TABS: ORAL_TABLET | ORAL | Status: DC

## 2014-10-17 MED ORDER — HYDROCODONE-HOMATROPINE 5-1.5 MG/5ML PO SYRP: 5.0000 mL | ORAL_SOLUTION | Freq: Three times a day (TID) | ORAL | Status: DC | PRN

## 2014-10-17 NOTE — Progress Notes (Signed)
Pre visit review using our clinic review tool, if applicable. No additional management support is needed unless otherwise documented below in the visit note. 

## 2014-10-17 NOTE — Patient Instructions (Signed)
Cough, Adult  A cough is a reflex that helps clear your throat and airways. It can help heal the body or may be a reaction to an irritated airway. A cough may only last 2 or 3 weeks (acute) or may last more than 8 weeks (chronic).  CAUSES Acute cough:  Viral or bacterial infections. Chronic cough:  Infections.  Allergies.  Asthma.  Post-nasal drip.  Smoking.  Heartburn or acid reflux.  Some medicines.  Chronic lung problems (COPD).  Cancer. SYMPTOMS   Cough.  Fever.  Chest pain.  Increased breathing rate.  High-pitched whistling sound when breathing (wheezing).  Colored mucus that you cough up (sputum). TREATMENT   A bacterial cough may be treated with antibiotic medicine.  A viral cough must run its course and will not respond to antibiotics.  Your caregiver may recommend other treatments if you have a chronic cough. HOME CARE INSTRUCTIONS   Only take over-the-counter or prescription medicines for pain, discomfort, or fever as directed by your caregiver. Use cough suppressants only as directed by your caregiver.  Use a cold steam vaporizer or humidifier in your bedroom or home to help loosen secretions.  Sleep in a semi-upright position if your cough is worse at night.  Rest as needed.  Stop smoking if you smoke. SEEK IMMEDIATE MEDICAL CARE IF:   You have pus in your sputum.  Your cough starts to worsen.  You cannot control your cough with suppressants and are losing sleep.  You begin coughing up blood.  You have difficulty breathing.  You develop pain which is getting worse or is uncontrolled with medicine.  You have a fever. MAKE SURE YOU:   Understand these instructions.  Will watch your condition.  Will get help right away if you are not doing well or get worse. Document Released: 09/03/2010 Document Revised: 05/30/2011 Document Reviewed: 09/03/2010 Exit[employer] Patient Information 2015 [employer]. This information is not intended  to replace advice given to you by your health care provider. Make sure you discuss any questions you have with your health care provider.  

## 2014-10-17 NOTE — Progress Notes (Signed)
HPI  Pt presents to the clinic today with c/o cough. This started 3 days ago. The cough is productive of yellow/greem mucous. The cough seems worse at night. He has not noticed any runny nose, ear pain or sore throat. He denies fever, chills or body aches. He has tried OTC cough syrup without much relief. He has no history of seasonal allergies. He has had sick contacts with similar symptoms. He does not smoke.  Review of Systems      Past Medical History  Diagnosis Date  . Hypertension     Family History  Problem Relation Age of Onset  . Hypertension Mother   . Coronary artery disease Father   . Hypertension Father   . Hyperlipidemia Father   . Diabetes Father     History   Social History  . Marital Status: Married    Spouse Name: N/A  . Number of Children: N/A  . Years of Education: N/A   Occupational History  . 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 Smoking status: Never Smoker   . Smokeless tobacco: Never Used  . Alcohol Use: Yes  . Drug Use: No  . Sexual Activity: Not on file   Other Topics Concern  . Not on file   Social History Narrative   Regular exercise--yes    Allergies  Allergen Reactions  . Chocolate Other (See Comments)    Migraine  . Shrimp [Shellfish Allergy] Other (See Comments)    Migraines     Constitutional: Denies headache, fatigue, fever or abrupt weight changes.  HEENT:   Denies eye redness, eye pain, pressure behind the eyes, facial pain, nasal congestion, ear pain, ringing in the ears, wax buildup, runny nose or sore throat. Respiratory: Positive cough. Denies difficulty breathing or shortness of breath.  Cardiovascular: Denies chest pain, chest tightness, palpitations or swelling in the hands or feet.   No other specific complaints in a complete review of systems (except as listed in HPI above).  Objective:   BP 132/70 mmHg  Pulse 100  Temp(Src) 98.5 F (36.9 C) (Oral)  Wt 220 lb 12 oz (100.132  kg)  SpO2 97% Wt Readings from Last 3 Encounters:  10/17/14 220 lb 12 oz (100.132 kg)  03/04/14 212 lb 4 oz (96.276 kg)  12/11/13 209 lb 8 oz (95.029 kg)     General: Appears his stated age, well developed, well nourished in NAD. HEENT: Head: normal shape and size, no sinus tenderness noted; Eyes: sclera white, no icterus, conjunctiva pink, PERRLA and EOMs intact; Ears: Tm's gray and intact, normal light reflex; Nose: mucosa boggy and moist, septum midline; Throat/Mouth: + PND. Teeth present, mucosa pink and moist, no exudate noted, no lesions or ulcerations noted.  Neck: No cervical lymphadenopathy.  Cardiovascular: Normal rate and rhythm. S1,S2 noted.  No murmur, rubs or gallops noted.  Pulmonary/Chest: Normal effort and positive vesicular breath sounds. No respiratory distress. No wheezes, rales or ronchi noted.     Assessment & Plan:   Upper Respiratory Infection with Cough:  Likely Viral Get some rest and drink plenty of water Take Mucinex BID for the next 3 days Printed RX for Azithromax x 5 days given to start on Monday if symptoms persist or worsen Rx for Hycodan cough syrup  RTC as needed or if symptoms persist.

## 2014-10-20 ENCOUNTER — Telehealth: Payer: Self-pay | Admitting: Family Medicine

## 2014-10-20 NOTE — Telephone Encounter (Signed)
Ok, good. Continue with the zpack and let me know if his symptoms do not resolve.

## 2014-10-20 NOTE — Telephone Encounter (Signed)
Lets finish out the zpack. He needs to call me on Thursday with an update, sooner if worse

## 2014-10-20 NOTE — Telephone Encounter (Signed)
Pt called in with an update, he started the z pack, the fever has come and gone, he thinks he is going to be ok. He still has the cough and a lot of junk in his chest.  It was a dry cough on Friday and now it is not.  Callback number [phone]

## 2014-10-20 NOTE — Telephone Encounter (Signed)
Pt states he has had up and down temperatures today--not as high as yesterday but up to 101.?---pt states he has 2 days left of Azithro and is having a productive cough with colored sputum--pt has continued to take OTC mucinex--please advise

## 2014-10-21 NOTE — Telephone Encounter (Signed)
Pt is aware as instructed 

## 2014-10-24 ENCOUNTER — Telehealth: Payer: Self-pay | Admitting: Internal Medicine

## 2014-10-24 ENCOUNTER — Ambulatory Visit (INDEPENDENT_AMBULATORY_CARE_PROVIDER_SITE_OTHER)
Admission: RE | Admit: 2014-10-24 | Discharge: 2014-10-24 | Disposition: A | Discharge status: Home / Self Care | Payer: 59 | Source: Ambulatory Visit | Attending: Primary Care | Admitting: Primary Care

## 2014-10-24 ENCOUNTER — Ambulatory Visit (INDEPENDENT_AMBULATORY_CARE_PROVIDER_SITE_OTHER): Payer: 59 | Admitting: Primary Care

## 2014-10-24 ENCOUNTER — Telehealth: Payer: Self-pay | Admitting: Family Medicine

## 2014-10-24 ENCOUNTER — Encounter: Payer: Self-pay | Admitting: Primary Care

## 2014-10-24 VITALS — BP 122/72 | HR 96 | Temp 97.6°F | Ht 66.5 in | Wt 218.1 lb

## 2014-10-24 DIAGNOSIS — R05 Cough: Secondary | ICD-10-CM

## 2014-10-24 DIAGNOSIS — R059 Cough, unspecified: Secondary | ICD-10-CM

## 2014-10-24 MED ORDER — DOXYCYCLINE HYCLATE 100 MG PO TABS: 100.0000 mg | ORAL_TABLET | Freq: Two times a day (BID) | ORAL | Status: DC

## 2014-10-24 NOTE — Progress Notes (Signed)
Subjective:    Patient ID: Dennis Hicks, male    DOB: 12-Aug-1955, 59 y.o.   MRN: [id number]  HPI  Dennis Hicks is a 59 year old male who presents today with a chief complaint of cough. He was evaluated on 7/29 for productive cough that had been present 3 days prior. He was provided with a script for Hycodan to use for cough, supportive measures, and a printed a script for Azithromycin to fill Monday this week if no improvement.   He was running a fever of 102 Saturday night and Sunday. His fever then broke this Tuesday this week. Overall the cough is improved but continues to be bothersome. He filled the Azithromycin and took the antibiotic as directed. He's been taking the Hycodan cough medication without much reduction in his cough. He started feeling much better yesterday and then later last night began feeling worse.  Review of Systems  Constitutional: Positive for fever.  HENT: Negative for congestion, ear pain, sinus pressure and sore throat.   Respiratory: Positive for cough.   Cardiovascular: Negative for chest pain.  Gastrointestinal: Negative for nausea and vomiting.  Musculoskeletal: Positive for myalgias.       Past Medical History  Diagnosis Date  . Hypertension     History   Social History  . Marital Status: Married    Spouse Name: N/A  . Number of Children: N/A  . Years of Education: N/A   Occupational History  . 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 Smoking status: Never Smoker   . Smokeless tobacco: Never Used  . Alcohol Use: Yes  . Drug Use: No  . Sexual Activity: Not on file   Other Topics Concern  . Not on file   Social History Narrative   Regular exercise--yes    No past surgical history on file.  Family History  Problem Relation Age of Onset  . Hypertension Mother   . Coronary artery disease Father   . Hypertension Father   . Hyperlipidemia Father   . Diabetes Father     Allergies  Allergen Reactions  .  Chocolate Other (See Comments)    Migraine  . Shrimp [Shellfish Allergy] Other (See Comments)    Migraines    Current Outpatient Prescriptions on File Prior to Visit  Medication Sig Dispense Refill  . benazepril-hydrochlorthiazide (LOTENSIN HCT) 20-25 MG per tablet TAKE 1 TABLET DAILY 90 tablet 3  . betamethasone dipropionate (DIPROLENE) 0.05 % cream Apply 1 application topically 2 (two) times daily. 30 g 0  . famotidine (PEPCID AC) 10 MG chewable tablet Chew 10 mg by mouth at bedtime as needed.    Marland Kitchen HYDROcodone-homatropine (HYCODAN) 5-1.5 MG/5ML syrup Take 5 mLs by mouth every 8 (eight) hours as needed for cough. 240 mL 0  . simvastatin (ZOCOR) 20 MG tablet Take 1 tablet (20 mg total) by mouth daily. 90 tablet 3  . SUMAtriptan (IMITREX) 100 MG tablet TAKE 1 TABLET BY MOUTH EVERY 2 HOURS AS NEEDED( MAX OF 2 TABS IN 24 HOURS) 10 tablet 2  . VIAGRA 100 MG tablet TAKE 1 TABLET AS NEEDED FOR ERECTILE DYSFUNCTION 36 tablet 3   No current facility-administered medications on file prior to visit.    BP 122/72 mmHg  Pulse 96  Temp(Src) 97.6 F (36.4 C) (Oral)  Ht 5' 6.5" (1.689 m)  Wt 218 lb 1.9 oz (98.939 kg)  BMI 34.68 kg/m2  SpO2 97%    Objective:   Physical  Exam  Constitutional: He appears well-nourished. He does not appear ill.  HENT:  Right Ear: Tympanic membrane and ear canal normal.  Left Ear: Tympanic membrane and ear canal normal.  Nose: Nose normal. Right sinus exhibits no maxillary sinus tenderness and no frontal sinus tenderness. Left sinus exhibits no maxillary sinus tenderness and no frontal sinus tenderness.  Mouth/Throat: Oropharynx is clear and moist.  Eyes: Conjunctivae are normal. Pupils are equal, round, and reactive to light.  Neck: Neck supple.  Cardiovascular: Normal rate and regular rhythm.   Pulmonary/Chest: He has rhonchi in the left upper field and the left middle field.  Dry cough during exam  Lymphadenopathy:    He has no cervical adenopathy.  Skin:  Skin is warm and dry.          Assessment & Plan:  Cough:  Present for 9 days now. Endorses fever of 102 over last weekend and early this week. He took Azithromycin as prescribed. Overall feeling better, but he believes he needs more antibiotics. Some rales to left upper and middle lobes. Possible decreased sounds to bases. Chest xray today to rule out pneumonia. Continue hycodan. Will treat according to xray as his vitals and exam are mostly unremarkable.

## 2014-10-24 NOTE — Telephone Encounter (Signed)
Pt called back and pt already has appt to see Allie Bossier NP on 10/24/14. Webb Silversmith NP said OK for pt to keep appt already scheduled. Pt said would see Allie Bossier NP this afternoon.

## 2014-10-24 NOTE — Telephone Encounter (Signed)
Pt was seen today and called [facility] in [place] to check on medication that is supposed to be called in. Please advise. He would like to pick it up today. He needs something for the weekend for his chest.

## 2014-10-24 NOTE — Patient Instructions (Signed)
Complete xray(s) prior to leaving today. I will contact you regarding your results.  Continue to take the Hycodan as needed for cough.  Push intake of water to stay hydrated.  It was nice to meet you!

## 2014-10-24 NOTE — Telephone Encounter (Addendum)
Pt seen on 10/17/14; finished abx and on 10/23/14 felt better but today 10/24/14 pt is feeling worse;pt request appt or different abx. Unable to reach pt for further info of symptoms. Did not leave pharmacy info.

## 2014-10-24 NOTE — Progress Notes (Signed)
Pre visit review using our clinic review tool, if applicable. No additional management support is needed unless otherwise documented below in the visit note. 

## 2014-10-24 NOTE — Telephone Encounter (Signed)
Xray results were just reviewed. Antibiotics called into pharmacy. He is to call Monday if he needs more cough medication. Discussed that this is likely viral and that he has a residual cough that may last for another week. He insists on trying another antibiotic.

## 2014-10-27 ENCOUNTER — Telehealth: Payer: Self-pay | Admitting: Primary Care

## 2014-10-27 DIAGNOSIS — R059 Cough, unspecified: Secondary | ICD-10-CM

## 2014-10-27 DIAGNOSIS — R05 Cough: Secondary | ICD-10-CM

## 2014-10-27 MED ORDER — HYDROCODONE-HOMATROPINE 5-1.5 MG/5ML PO SYRP: 5.0000 mL | ORAL_SOLUTION | Freq: Three times a day (TID) | ORAL | Status: DC | PRN

## 2014-10-27 NOTE — Telephone Encounter (Signed)
Does he need additional cough medication? I was confused by the team health note. Thanks.

## 2014-10-27 NOTE — Telephone Encounter (Signed)
Called and notified patient that Rx is ready for pick. Left in front office.

## 2014-10-27 NOTE — Telephone Encounter (Signed)
Ready for pick up     Thanks

## 2014-10-27 NOTE — Telephone Encounter (Signed)
Called patient. He stated that he was able to get the antibiotics. He stated there was mix up on the pharmacy. Patient stated that yes, he does need additional cough medication. Patient will come pick when it is ready.

## 2014-10-27 NOTE — Telephone Encounter (Signed)
PLEASE NOTE: All timestamps contained within this report are represented as [place] Standard Time. CONFIDENTIALTY NOTICE: This fax transmission is intended only for the addressee. It contains information that is legally privileged, confidential or otherwise protected from use or disclosure. If you are not the intended recipient, you are strictly prohibited from reviewing, disclosing, copying using or disseminating any of this information or taking any action in reliance on or regarding this information. If you have received this fax in error, please notify us immediately by telephone so that we can arrange for its return to [place]. Phone: [phone], Toll-Free: [phone], Fax: [phone] Page: 1 of 2 Call Id: [id number] [place] Patient Name: Dennis Hicks Gender: Male DOB: [birth year] Age: 59 Y 85 M 17 D Return Phone Number: [phone] (Primary) Address: City/State/Zip: [place] Client [place] Night - Client Client Site [place] Physician Alma Friendly Contact Type Call Call Type Triage / Clinical Relationship To Patient Self Return Phone Number [phone] (Primary) Chief Complaint Prescription Refill or Medication Request (non symptomatic) Initial Comment Caller states he is missing Rx. Nurse Assessment Nurse: Germain Osgood RN, Opal Sidles Date/Time Eilene Ghazi Time): 10/24/2014 6:06:54 PM Confirm and document reason for call. If symptomatic, describe symptoms. ---Caller reports his MD was supposed to call in Rx for Z-Pac and is not at [facility] in [place]. [phone] Reports NKDA. Has the patient traveled out of the country within the last 30 days? ---Not Applicable Does the patient require triage? ---No Please document clinical information provided and list any resource used. ---N/A Guidelines Guideline Title Affirmed Question  Affirmed Notes Nurse Date/Time ([place] Time) Disp. Time Eilene Ghazi Time) Disposition Final User 10/24/2014 6:32:00 PM Paged On Call back to Call [place], [place] 10/24/2014 6:40:56 PM Clinical Call Yes Germain Osgood, RN, Opal Sidles After Care Instructions Given Call Event Type User Date / Time Description Comments User: Susanne Greenhouse, RN Date/Time Eilene Ghazi Time): 10/24/2014 6:34:46 PM Caller reports MD called him back after reviewing chest X-Ray and advised patient not sure if congestion viral or bacterial Advised antibiotic would be ordered for 7 days BID. Discussed Hycodan order and was told could Pic Rx up at office on Monday. Is concerned that Abx is also at office. User: Susanne Greenhouse, RN Date/Time Eilene Ghazi Time): 10/24/2014 6:40:43 PM Caller informed of MD instructions. verbalized understanding PLEASE NOTE: All timestamps contained within this report are represented as [place] Standard Time. CONFIDENTIALTY NOTICE: This fax transmission is intended only for the addressee. It contains information that is legally privileged, confidential or otherwise protected from use or disclosure. If you are not the intended recipient, you are strictly prohibited from reviewing, disclosing, copying using or disseminating any of this information or taking any action in reliance on or regarding this information. If you have received this fax in error, please notify us immediately by telephone so that we can arrange for its return to [place]. Phone: [phone], Toll-Free: [phone], Fax: [phone] Page: 2 of 2 Call Id: [id number] Paging DoctorName Phone DateTime Result/Outcome Message Type Notes Viviana Simpler [phone] 10/24/2014 6:32:00 PM Paged On Call Back to Call Center Doctor Paged Please call Opal Sidles at the [facility] [phone] Viviana Simpler 10/24/2014 6:40:08 PM Spoke with On Call - General Message Result Dr/ Silvio Pate informed of caller's report reviewed record and found note MD sent Rx to [facility] for Doxycycline 100 mg po BID 14 tabs.

## 2014-10-30 ENCOUNTER — Other Ambulatory Visit: Payer: Self-pay | Admitting: Family Medicine

## 2014-11-13 ENCOUNTER — Encounter: Payer: Self-pay | Admitting: Family Medicine

## 2014-11-13 ENCOUNTER — Ambulatory Visit (INDEPENDENT_AMBULATORY_CARE_PROVIDER_SITE_OTHER): Payer: 59 | Admitting: Family Medicine

## 2014-11-13 VITALS — BP 118/80 | HR 101 | Temp 99.2°F | Ht 66.5 in | Wt 219.0 lb

## 2014-11-13 DIAGNOSIS — R05 Cough: Secondary | ICD-10-CM | POA: Diagnosis not present

## 2014-11-13 DIAGNOSIS — J189 Pneumonia, unspecified organism: Secondary | ICD-10-CM | POA: Diagnosis not present

## 2014-11-13 DIAGNOSIS — R059 Cough, unspecified: Secondary | ICD-10-CM

## 2014-11-13 MED ORDER — LEVOFLOXACIN 500 MG PO TABS: 500.0000 mg | ORAL_TABLET | Freq: Every day | ORAL | Status: DC

## 2014-11-13 MED ORDER — HYDROCODONE-HOMATROPINE 5-1.5 MG/5ML PO SYRP: 5.0000 mL | ORAL_SOLUTION | Freq: Three times a day (TID) | ORAL | Status: DC | PRN

## 2014-11-13 NOTE — Assessment & Plan Note (Addendum)
Likely given sounds in left lower and mid lung and new fever, 1 month of symptoms.  Viral PNA also possible given her has not responded to other antibiotics.  Given recent CXR, pt opts to not repeat today.  Will treat emperically with levoquin x 10 days.

## 2014-11-13 NOTE — Progress Notes (Signed)
Pre visit review using our clinic review tool, if applicable. No additional management support is needed unless otherwise documented below in the visit note. 

## 2014-11-13 NOTE — Progress Notes (Signed)
   Subjective:    Patient ID: Dennis Hicks, male    DOB: Dec 04, 1955, 59 y.o.   MRN: [id number]  Cough This is a new problem. The current episode started more than 1 month ago. The problem has been gradually worsening. Associated symptoms include chest pain and a fever. Pertinent negatives include no ear pain, shortness of breath or wheezing. Associated symptoms comments: Coughing fits with lying down..   Seen  On 7/29/ [place]:  Thought likely viral, 103 fever but did not get better so given Z-pack.  Hycodan cough syrup. Fever broke, but still felt ill. Returned and saw Allie Bossier.  CXR:  No cardiopulmonary disease Treated with doxycycline.   Felt some better last week. Went back to work. In last 4 days symptoms have returned full force. Cough fits,  No SOB, no wheeze,  Mucus thick, productive cough. Ribs hurt from coughing.  Fever returned in last 24 hour 101. F    Got some better with doxycycline x 10 days.   Remote  short term smoker.   Review of Systems  Constitutional: Positive for fever and fatigue.  HENT: Positive for congestion. Negative for ear pain, mouth sores, nosebleeds and sinus pressure.   Eyes: Negative for pain.  Respiratory: Positive for cough. Negative for shortness of breath and wheezing.   Cardiovascular: Positive for chest pain. Negative for palpitations and leg swelling.       Objective:   Physical Exam  Constitutional: Vital signs are normal. He appears well-developed and well-nourished.  Non-toxic appearance. He does not appear ill. No distress.  HENT:  Head: Normocephalic and atraumatic.  Right Ear: Hearing, tympanic membrane, external ear and ear canal normal. No tenderness. No foreign bodies. Tympanic membrane is not retracted and not bulging.  Left Ear: Hearing, tympanic membrane, external ear and ear canal normal. No tenderness. No foreign bodies. Tympanic membrane is not retracted and not bulging.  Nose: Nose normal. No mucosal edema or rhinorrhea.  Right sinus exhibits no maxillary sinus tenderness and no frontal sinus tenderness. Left sinus exhibits no maxillary sinus tenderness and no frontal sinus tenderness.  Mouth/Throat: Uvula is midline, oropharynx is clear and moist and mucous membranes are normal. Normal dentition. No dental caries. No oropharyngeal exudate or tonsillar abscesses.  Eyes: Conjunctivae, EOM and lids are normal. Pupils are equal, round, and reactive to light. Lids are everted and swept, no foreign bodies found.  Neck: Trachea normal, normal range of motion and phonation normal. Neck supple. Carotid bruit is not present. No thyroid mass and no thyromegaly present.  Cardiovascular: Normal rate, regular rhythm, S1 normal, S2 normal, normal heart sounds, intact distal pulses and normal pulses.  Exam reveals no gallop.   No murmur heard. Pulmonary/Chest: Effort normal. No respiratory distress. He has no decreased breath sounds. He has no wheezes. He has rhonchi in the left middle field and the left lower field. He has no rales.  Abdominal: Soft. Normal appearance and bowel sounds are normal. There is no hepatosplenomegaly. There is no tenderness. There is no rebound, no guarding and no CVA tenderness. No hernia.  Neurological: He is alert. He has normal reflexes.  Skin: Skin is warm, dry and intact. No rash noted.  Psychiatric: He has a normal mood and affect. His speech is normal and behavior is normal. Judgment normal.          Assessment & Plan:

## 2014-11-13 NOTE — Patient Instructions (Signed)
Complete antibiotics.   Can use hycodan for cough as needed. Continue mucinex as well.  Call if symptoms not improving in next 72 hours.

## 2014-11-14 ENCOUNTER — Telehealth: Payer: Self-pay | Admitting: Family Medicine

## 2014-11-14 MED ORDER — AMOXICILLIN-POT CLAVULANATE 875-125 MG PO TABS: 1.0000 | ORAL_TABLET | Freq: Two times a day (BID) | ORAL | Status: DC

## 2014-11-14 NOTE — Telephone Encounter (Signed)
Call patient. Have him hold med as could be causing symptoms.  Will change to Augmentin x 10 days.  Can use ibupreofen 800 mg everey 8 hours for pain  If pain not improving and severe or shortness of breath.. Go to ER!

## 2014-11-14 NOTE — Telephone Encounter (Signed)
Mr. College notified as instructed by telephone.  He states he thinks that the pain he was experiencing was just coming from coughing so much.  He would really like to continue taking the Levaquin.  He took the Levaquin today at 1:00 pm and has not had any problems.  Spoke with Dr. Diona Browner.  Okay to continue with Levaquin.  Patient can't tolerate Ibuprofen so advised to take Tylenol for the pain.  Mr. Ginsberg understands that if the pain is not improving or he starts having shortness of breath then he will need to go to the ER.  Called pharmacy and cancelled prescription for Augmentin.

## 2014-11-14 NOTE — Telephone Encounter (Signed)
Patient Name: Dennis Hicks  DOB: [birth year]    Initial Comment Caller states he received an Rx for his bronchitis and he woke up with an excruciating pain in his left shoulder and it is still sore this morning. He thinks this may be from the medication. The Rx says to notify his dr if he has joint pain and other symptoms.   Nurse Assessment  Nurse: Mallie Mussel, RN, Alveta Heimlich Date/Time Eilene Ghazi Time): 11/14/2014 12:58:34 PM  Confirm and document reason for call. If symptomatic, describe symptoms. ---Caller states that he began with bronchitis and was diagnosed with pneumonia yesterday and was prescribed Levoquin for it. During the night, he developed left shoulder pain. It was bad enough for a while he thought he may have to go to the ER. He read that he could have severe joint pain with it. He currently rates his pain as only 1 on 0-10 scale, but last night it was 8-9. This is listed as a side effect of the medication on [facility]'s site. The shoulder is not bothering him presently. He has only taken one dose. What does he need to do?  Has the patient traveled out of the country within the last 30 days? ---Not Applicable  Does the patient require triage? ---No     Guidelines    Guideline Title Affirmed Question Affirmed Notes       Final Disposition User        Comments  I called the backline and spoke with [name] Hummingbird who checked with Rena. Mearl Latin advises that as long as he is not having chest pain to send a message and she will see to it that Dr. Diona Browner gets the message.  Caller notified and verbalized understanding.

## 2014-11-17 ENCOUNTER — Telehealth: Payer: Self-pay

## 2014-11-17 ENCOUNTER — Telehealth: Payer: Self-pay | Admitting: Family Medicine

## 2014-11-17 NOTE — Telephone Encounter (Signed)
PLEASE NOTE: All timestamps contained within this report are represented as [place] Standard Time. CONFIDENTIALTY NOTICE: This fax transmission is intended only for the addressee. It contains information that is legally privileged, confidential or otherwise protected from use or disclosure. If you are not the intended recipient, you are strictly prohibited from reviewing, disclosing, copying using or disseminating any of this information or taking any action in reliance on or regarding this information. If you have received this fax in error, please notify us immediately by telephone so that we can arrange for its return to [place]. Phone: [phone], Toll-Free: [phone], Fax: [phone] Page: 1 of 2 Call Id: [id number] [place] Patient Name: Dennis Hicks Gender: Male DOB: 1956-01-08 Age: 59 Y 9 M 9 D Return Phone Number: [phone] (Primary) Address: City/State/Zip: [place] Client [place] Night - Client Client Site [facility] Physician Diona Browner, [place] Contact Type Call Call Type Triage / Clinical Relationship To Patient Self Return Phone Number [phone] (Primary) Chief Complaint Coughing Up Blood Initial Comment Caller states, was seen on Thurs, dx with pneumonia, taking Rx. He was getting better, but then had a relapse. He coughed up a little bit of blood. 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 PreDisposition Home Care Nurse Assessment Nurse: Windle Guard, RN, Lesa Date/Time Eilene Ghazi Time): 11/16/2014 1:09:09 PM Confirm and document reason for call. If symptomatic, describe symptoms. ---Caller states he was dx with pneumonia on Thursday. Today he coughed up some blood. Has the patient traveled out of the country within the last 30 days? ---No Does the patient require triage? ---Yes Related visit  to physician within the last 2 weeks? ---Yes Does the PT have any chronic conditions? (i.e. diabetes, asthma, etc.) ---Yes List chronic conditions. ---HTN Guidelines Guideline Title Affirmed Question Affirmed Notes Nurse Date/Time (Eastern Time) Coughing Up Blood [1] Coughing up yellow or green sputum AND [2] present > 5 days Conner, RN, Lesa 11/16/2014 1:11:16 PM Disp. Time Eilene Ghazi Time) Disposition Final User 11/16/2014 1:17:58 PM See Physician within 24 Hours Yes Conner, RN, Emmaline Kluver Caller Understands: Yes Disagree/Comply: Comply PLEASE NOTE: All timestamps contained within this report are represented as [place] Standard Time. CONFIDENTIALTY NOTICE: This fax transmission is intended only for the addressee. It contains information that is legally privileged, confidential or otherwise protected from use or disclosure. If you are not the intended recipient, you are strictly prohibited from reviewing, disclosing, copying using or disseminating any of this information or taking any action in reliance on or regarding this information. If you have received this fax in error, please notify us immediately by telephone so that we can arrange for its return to [place]. Phone: [phone], Toll-Free: [phone], Fax: [phone] Page: 2 of 2 Call Id: [id number] Care Advice Given Per Guideline SEE PHYSICIAN WITHIN 24 HOURS: * IF OFFICE WILL BE OPEN: You need to be seen within the next 24 hours. Call your doctor when the office opens, and make an appointment. COUGH MEDICINES: * OTC COUGH DROPS: Cough drops can help a lot, especially for mild coughs. They reduce coughing by soothing your irritated throat and removing that tickle sensation in the back of the throat. Cough drops also have the advantage of portability - you can carry them with you. * HOME REMEDY - HONEY: This old home remedy has been shown to help decrease coughing at night. The adult  dosage is 2 teaspoons (10 ml) at bedtime. Honey should not  be given to infants under one year of age. COUGHING SPASMS: Drink warm fluids. Inhale warm mist. (Reason: both relax the airway and loosen up the phlegm) Suck on cough drops or hard candy to coat the irritated throat. HUMIDIFIER: If the air is dry, use a humidifier in the bedroom. (Reason: dry air makes coughs worse) CALL BACK IF: * You cough up more than a tablespoon of pure red blood * Difficulty breathing * You become worse. CARE ADVICE given per Coughing Up Blood guideline. After Care Instructions Given Call Event Type User Date / Time Description Referrals REFERRED TO PCP OFFICE

## 2014-11-17 NOTE — Telephone Encounter (Signed)
Pt calling in feeling bad, still on the current medication,has coughed up small amount of blood over weekend (called [facility] and they didn't seem too concerned about this), wants to know if you think it is safe to travel for work, or if he needs to stay home and rest. Best number to call is [phone], thanks

## 2014-11-17 NOTE — Telephone Encounter (Signed)
PLEASE NOTE: All timestamps contained within this report are represented as [place] Standard Time. CONFIDENTIALTY NOTICE: This fax transmission is intended only for the addressee. It contains information that is legally privileged, confidential or otherwise protected from use or disclosure. If you are not the intended recipient, you are strictly prohibited from reviewing, disclosing, copying using or disseminating any of this information or taking any action in reliance on or regarding this information. If you have received this fax in error, please notify us immediately by telephone so that we can arrange for its return to [place]. Phone: [phone], Toll-Free: [phone], Fax: [phone] Page: 1 of 2 Call Id: [id number] [place] Patient Name: Dennis Hicks Gender: Male DOB: 1955/09/30 Age: 59 Y 9 M 7 D Return Phone Number: [phone] (Primary) Address: City/State/Zip: [place] Client [place] Night - Client Client Site [place] Physician Diona Browner, [place] Contact Type Call Call Type Triage / Clinical Relationship To Patient Self Return Phone Number [phone] (Primary) Chief Complaint Shoulder Injury Initial Comment caller has been Dx w/ pneumonia; is having intermittent shoulder pain, PreDisposition Home Care Nurse Assessment Nurse: Vevelyn Royals, RN, Verdis Frederickson Date/Time Eilene Ghazi Time): 11/14/2014 5:26:27 PM Confirm and document reason for call. If symptomatic, describe symptoms. ---Caller states he has been Dx w/ pneumonia; is having intermittent shoulder pain. Pain during the night was strong, woke him up. States Butch [name] from the office has already called back and stated they are calling in another antibiotic. Concerned about pain because he is taking Levaquin. States he was instructed to stop taking the Levaquin, caller states he asked to continue  Levaquin another day, and was told this was okay. If bothered again by pain tonight, will go get new antibiotic tomorrow. Still coughing. Temp 99.4-99.5 earlier today. Light headed when standing up. Has the patient traveled out of the country within the last 30 days? ---No Does the patient require triage? ---Yes Related visit to physician within the last 2 weeks? ---Yes Seen in office yesterday. Diagnosed with pneumonia. Does the PT have any chronic conditions? (i.e. diabetes, asthma, etc.) ---No Guidelines Guideline Title Affirmed Question Affirmed Notes Nurse Date/Time (Eastern Time) Pneumonia on Antibiotic Post-Hospitalization Follow-up Call Continuous (nonstop) coughing interferes with work or school Patient states he is still having coughing fits; coughing during triage. States there is an improvement in coughing. Denies seeing any blood or blood clots in phlegm. Does better if not talking. Shoulder pain is also better at this time, but is still present. Vevelyn Royals, RN, Verdis Frederickson 11/14/2014 5:36:27 PM PLEASE NOTE: All timestamps contained within this report are represented as [place] Standard Time. CONFIDENTIALTY NOTICE: This fax transmission is intended only for the addressee. It contains information that is legally privileged, confidential or otherwise protected from use or disclosure. If you are not the intended recipient, you are strictly prohibited from reviewing, disclosing, copying using or disseminating any of this information or taking any action in reliance on or regarding this information. If you have received this fax in error, please notify us immediately by telephone so that we can arrange for its return to [place]. Phone: [phone], Toll-Free: [phone], Fax: [phone] Page: 2 of 2 Call Id: [id number] [place]. Time Eilene Ghazi Time) Disposition Final User 11/14/2014 5:10:46 PM Send To Clinical Follow Up [place] 11/14/2014 5:47:54 PM Go to ED Now (or PCP  triage) Yes Vevelyn Royals, RN, Verdis Frederickson Disposition Overriden: See Physician within 24 Hours Override Reason:  Patient's symptoms need a higher level of care Caller Understands: Yes Disagree/Comply: Disagree Disagree/Comply Reason: Disagree with instructions Care Advice Given Per Guideline * IF NO PCP TRIAGE: You need to be seen. [facility] within the next hour. Leave as soon as you can. DRIVING: Another adult should drive. CARE ADVICE given per Pneumonia on Antibiotic Post-Hospitalization Follow- Up Call (Adult) guideline. FEVER MEDICINES: * For fever relief, take acetaminophen or ibuprofen. * Treat fevers above 101 F (38.3 C). * The goal of fever therapy is to bring the fever down to a comfortable level. Remember that fever medicine usually lowers fever 2-3 F (1-1.5 C). ACETAMINOPHEN (E.G., TYLENOL): * Take 650 mg (two 325 mg pills) by mouth every 4-6 hours as needed. Each Regular Strength Tylenol pill has 325 mg of acetaminophen. The most you should take each day is 3,250 mg (10 Regular Strength pills a day). * Another choice is to take 1,000 mg (two 500 mg pills) every 8 hours as needed. Each Extra Strength Tylenol pill has 500 mg of acetaminophen. The most you should take each day is 3,000 mg (6 Extra Strength pills a day). * Before taking any medicine, read all the instructions on the package. After Care Instructions Given Call Event Type User Date / Time Description Comments User: Noah Delaine, RN Date/Time Eilene Ghazi Time): 11/14/2014 5:49:48 PM Nurse upgraded outcome due to patient's report of symptoms. States O2 Sat was 95 % yesterday. Patient states he is still having coughing fits; coughing during triage. States there is an improvement in coughing. Denies seeing any blood or blood clots in phlegm. Does better if not talking. Shoulder pain is also better at this time, but is still present. Patient states he does not want to go to UC/ED, but will go if symptoms  worsen. Referrals GO TO FACILITY REFUSED

## 2014-11-17 NOTE — Telephone Encounter (Signed)
Please call pt for update.

## 2014-11-17 NOTE — Telephone Encounter (Signed)
See 11/14/14 previous phone note.

## 2014-11-17 NOTE — Telephone Encounter (Signed)
Call pt for update.

## 2014-11-18 NOTE — Telephone Encounter (Signed)
See other phone note from 11/17/2014.

## 2014-11-18 NOTE — Telephone Encounter (Signed)
I agree, thank you for getting an update.

## 2014-11-18 NOTE — Telephone Encounter (Signed)
Spoke with [employer].  He states he feels like he is slowly getting better.  He states he coughed up some blood on Saturday and Sunday but it was very little and from what he read, it is very common with pneumonia/bronchitis.  He did not cough up any blood yesterday.  He states the mornings usually start out good then by the evening he starts coughing.  He is taking the cough medication at night and is able to sleep.  He was suppose to travel to [facility] yesterday and be gone until Thursday for work and was wanting to get advise on whether he should travel or not.  Since he didn't hear back from [place] yesterday he decided to stay home.  I advised staying home and not traveling was a good call.  Advised to continue and complete his course of antibiotic, push fluids and rest.  I ask if he needed a note for work and he states a note is not needed.

## 2014-11-26 ENCOUNTER — Other Ambulatory Visit: Payer: Self-pay

## 2014-11-26 DIAGNOSIS — R059 Cough, unspecified: Secondary | ICD-10-CM

## 2014-11-26 DIAGNOSIS — R05 Cough: Secondary | ICD-10-CM

## 2014-11-26 NOTE — Telephone Encounter (Signed)
Pt left v/m requesting rx hycodan. Call when ready for pick up. Pt has enough med for 11/26/14. Pt request cb. Pt last seen and rx last printed for #180 ml on 11/13/14.

## 2014-11-27 MED ORDER — HYDROCODONE-HOMATROPINE 5-1.5 MG/5ML PO SYRP: 5.0000 mL | ORAL_SOLUTION | Freq: Three times a day (TID) | ORAL | Status: DC | PRN

## 2014-11-27 NOTE — Telephone Encounter (Signed)
[facility] notified prescription for cough syrup is ready to be picked up at the front desk.

## 2014-11-27 NOTE — Telephone Encounter (Signed)
Spoke with Dennis Hicks.  He states he was seen two weeks ago today.  Finally this past Friday starting feeling better.  He has completed the antibiotics and cough syrup.  He states the cough is continuing to break up but at night when he lays down he can still feel at rattle in his chest.  He is still taking Mucinex but would like a refill on the Hycodan cough syrup to help at night.  Please advise.

## 2014-11-27 NOTE — Telephone Encounter (Signed)
Call pt, is he improving? This is not a long term med, if not getting better needs to be seen.

## 2015-03-10 ENCOUNTER — Encounter: Payer: Self-pay | Admitting: *Deleted

## 2015-03-10 ENCOUNTER — Other Ambulatory Visit: Payer: Self-pay | Admitting: Family Medicine

## 2015-03-10 ENCOUNTER — Encounter: Payer: 59 | Admitting: Family Medicine

## 2015-03-10 ENCOUNTER — Encounter: Payer: Self-pay | Admitting: Family Medicine

## 2015-03-10 ENCOUNTER — Ambulatory Visit (INDEPENDENT_AMBULATORY_CARE_PROVIDER_SITE_OTHER): Payer: 59 | Admitting: Family Medicine

## 2015-03-10 VITALS — BP 135/70 | HR 83 | Temp 97.3°F | Ht 66.5 in | Wt 222.5 lb

## 2015-03-10 DIAGNOSIS — E7800 Pure hypercholesterolemia, unspecified: Secondary | ICD-10-CM

## 2015-03-10 DIAGNOSIS — N289 Disorder of kidney and ureter, unspecified: Secondary | ICD-10-CM

## 2015-03-10 DIAGNOSIS — M545 Low back pain, unspecified: Secondary | ICD-10-CM

## 2015-03-10 DIAGNOSIS — G8929 Other chronic pain: Secondary | ICD-10-CM

## 2015-03-10 DIAGNOSIS — I10 Essential (primary) hypertension: Secondary | ICD-10-CM

## 2015-03-10 DIAGNOSIS — Z0000 Encounter for general adult medical examination without abnormal findings: Secondary | ICD-10-CM | POA: Diagnosis not present

## 2015-03-10 DIAGNOSIS — Z1159 Encounter for screening for other viral diseases: Secondary | ICD-10-CM

## 2015-03-10 DIAGNOSIS — R7303 Prediabetes: Secondary | ICD-10-CM

## 2015-03-10 DIAGNOSIS — R972 Elevated prostate specific antigen [PSA]: Secondary | ICD-10-CM | POA: Diagnosis not present

## 2015-03-10 DIAGNOSIS — Z23 Encounter for immunization: Secondary | ICD-10-CM

## 2015-03-10 DIAGNOSIS — Z1211 Encounter for screening for malignant neoplasm of colon: Secondary | ICD-10-CM

## 2015-03-10 LAB — COMPREHENSIVE METABOLIC PANEL
ALT: 36 U/L (ref 0–53)
AST: 23 U/L (ref 0–37)
Albumin: 4.6 g/dL (ref 3.5–5.2)
Alkaline Phosphatase: 64 U/L (ref 39–117)
BILIRUBIN TOTAL: 0.5 mg/dL (ref 0.2–1.2)
BUN: 18 mg/dL (ref 6–23)
CHLORIDE: 97 meq/L (ref 96–112)
CO2: 32 meq/L (ref 19–32)
CREATININE: 1.22 mg/dL (ref 0.40–1.50)
Calcium: 11.1 mg/dL — ABNORMAL HIGH (ref 8.4–10.5)
GFR: 64.83 mL/min (ref >60.00)
GLUCOSE: 98 mg/dL (ref 70–99)
Potassium: 4 mEq/L (ref 3.5–5.1)
Sodium: 139 mEq/L (ref 135–145)
Total Protein: 7.3 g/dL (ref 6.0–8.3)

## 2015-03-10 LAB — LIPID PANEL
CHOL/HDL RATIO: 6
Cholesterol: 209 mg/dL — ABNORMAL HIGH (ref 0–200)
HDL: 36.2 mg/dL — AB (ref >39.00)
LDL CALC: 133 mg/dL — AB (ref 0–99)
NONHDL: 172.8
Triglycerides: 198 mg/dL — ABNORMAL HIGH (ref 0.0–149.0)
VLDL: 39.6 mg/dL (ref 0.0–40.0)

## 2015-03-10 LAB — PSA: PSA: 5.34 ng/mL — ABNORMAL HIGH (ref 0.10–4.00)

## 2015-03-10 LAB — HEMOGLOBIN A1C: Hgb A1c MFr Bld: 6.9 % — ABNORMAL HIGH (ref 4.6–6.5)

## 2015-03-10 MED ORDER — BETAMETHASONE DIPROPIONATE 0.05 % EX CREA: 1.0000 "application " | TOPICAL_CREAM | Freq: Two times a day (BID) | CUTANEOUS | Status: DC

## 2015-03-10 MED ORDER — SUMATRIPTAN SUCCINATE 100 MG PO TABS: ORAL_TABLET | ORAL | Status: DC

## 2015-03-10 MED ORDER — HYDROCODONE-ACETAMINOPHEN 5-325 MG PO TABS: ORAL_TABLET | ORAL | Status: DC

## 2015-03-10 NOTE — Progress Notes (Signed)
The patient is here for annual wellness exam and preventative care.   Seen at [facility].. For psoriasis.  Hypertension: Great control today on Lotensin  BP Readings from Last 3 Encounters:  03/10/15 135/70  11/13/14 118/80  10/24/14 122/72  Using medication without problems or lightheadedness: None  Chest pain with exertion: None  Edema:None  Short of breath:None  Average home BPs: rarely checking  Other issues: Good eating habits except not able to eat fish given migraines. No exercise except golf.   Wt Readings from Last 3 Encounters:  03/10/15 222 lb 8 oz (100.925 kg)  11/13/14 219 lb (99.338 kg)  10/24/14 218 lb 1.9 oz (98.939 kg)  Body mass index is 35.38 kg/(m^2).  Elevated Cholesterol: Due for re-eval .. LDL goal <130 on zocor 20 mg daily   Recent Labs    Lab Results  Component Value Date   CHOL 188 03/03/2014   HDL 31.80* 03/03/2014   LDLCALC 129* 03/03/2014   LDLDIRECT 158.2 01/03/2013   TRIG 138.0 03/03/2014   CHOLHDL 6 03/03/2014    Using medications without problems:  Muscle aches: None  Other complaints:   Elevated PSA.. 2011 referred to Dr., Olena Heckle for increase in PSA over 1 year period 1 up to 4s  Has prostate biopsy 10/2009.. Negative.   Uses vicodin in spurts for several days every few months, for low back pain and right wrist pain and or right knee pain. where he has calcium deposit.   Migraine: Last 2-3 days at a time... Using 1 tabs a day on those days. Has migraine every few months. Knows triggers.  Prediabetes, borderline Due for re-eval.  Renal insufficiency: NSAIDs. Had SE to NSAIDs in past. Du for re-eval.   Review of Systems  Constitutional: Negative for fever, fatigue and unexpected weight change.  HENT: Negative for ear pain, congestion, sore throat, rhinorrhea, trouble swallowing and postnasal drip.  Eyes: Negative for pain.  Respiratory: Negative for cough, shortness of breath and wheezing.   Cardiovascular: Negative for chest pain, palpitations and leg swelling.  Gastrointestinal: Negative for nausea, abdominal pain, diarrhea, constipation and blood in stool.  Genitourinary: Negative for dysuria, urgency, hematuria, discharge, penile swelling, scrotal swelling, difficulty urinating, penile pain and testicular pain.  Skin: Negative for rash.  Neurological: Negative for syncope, weakness, light-headedness, numbness and headaches.  Psychiatric/Behavioral: Negative for behavioral problems and dysphoric mood. The patient is not nervous/anxious.  Objective:   Physical Exam  Constitutional: He appears well-developed and well-nourished. Non-toxic appearance. He does not appear ill. No distress.  HENT:  Head: Normocephalic and atraumatic.  Right Ear: Hearing, tympanic membrane, external ear and ear canal normal.  Left Ear: Hearing, tympanic membrane, external ear and ear canal normal.  Nose: Nose normal.  Mouth/Throat: Uvula is midline, oropharynx is clear and moist and mucous membranes are normal.  Eyes: Conjunctivae, EOM and lids are normal. Pupils are equal, round, and reactive to light. No foreign bodies found.  Neck: Trachea normal, normal range of motion and phonation normal. Carotid bruit is not present. No mass and no thyromegaly present.  TTP on left trapezius, no vertebral ttp, decrease lateral ROM, neg Spurling's test.  Cardiovascular: Normal rate, regular rhythm, S1 normal, S2 normal, intact distal pulses and normal pulses. Exam reveals no gallop.  No murmur heard.  Pulmonary/Chest: Breath sounds normal. He has no wheezes. He has no rhonchi. He has no rales.  Abdominal: Soft. Normal appearance and bowel sounds are normal. There is no hepatosplenomegaly. There is no tenderness. There is  no rebound, no guarding and no CVA tenderness. No hernia. Hernia confirmed negative in the right inguinal area and confirmed negative in the left inguinal area.   Genitourinary: Testes normal and penis normal. Rectal exam shows no external hemorrhoid, no internal hemorrhoid, no fissure, no mass, no tenderness and anal tone normal. Guaiac negative stool. Prostate is enlarged. Prostate is not tender. Right testis shows no mass and no tenderness. Left testis shows no mass and no tenderness. No paraphimosis or penile tenderness.  No prostate nodules  Lymphadenopathy:  He has no cervical adenopathy.  Right: No inguinal adenopathy present.  Left: No inguinal adenopathy present.  Neurological: He is alert. He has normal strength and normal reflexes. No cranial nerve deficit or sensory deficit. Gait normal.  Skin: Skin is warm, dry and intact. Dark irregualr lesion noted on right chest, present long time. Psychiatric: He has a normal mood and affect. His speech is normal and behavior is normal. Judgment normal.  Assessment & Plan:   Complete Physical Exam: The patient's preventative maintenance and recommended screening tests for an annual wellness exam were reviewed in full today.  Brought up to date unless services declined.  Counselled on the importance of diet, exercise, and its role in overall health and mortality.  The patient's FH and SH was reviewed, including their home life, tobacco status, and drug and alcohol status.   Colon: great aunt and uncle with colon cancer, no first degree relative. Plans to do iFOB yearly. Vaccines Refused Tdap, feel had on in last 10 years and given flu. PSA: Due for re-eval yearly biopsy nml in 2011 per uro. Lab Results  Component Value Date   PSA 3.76 03/03/2014   PSA 3.09 01/03/2013   PSA 2.93 11/25/2011         HIV: Refused. Nonsmoker Hep C: Due.

## 2015-03-10 NOTE — Assessment & Plan Note (Signed)
Well controlled. Continue current medication.  

## 2015-03-10 NOTE — Assessment & Plan Note (Signed)
UDS yearly due. Vicodin prn low back pain. Limit use.

## 2015-03-10 NOTE — Progress Notes (Signed)
Pre visit review using our clinic review tool, if applicable. No additional management support is needed unless otherwise documented below in the visit note. 

## 2015-03-10 NOTE — Assessment & Plan Note (Signed)
Due for re-eval. 

## 2015-03-10 NOTE — Patient Instructions (Addendum)
Work on low fat, low carb diet. Increase protein and fiber in diet.  Try to find a way to exercise.. Water exercise or low impact walking.  Stop at lab on way out for ifob and lab work.

## 2015-03-10 NOTE — Addendum Note (Signed)
Addended by: Marchia Bond on: 03/10/2015 03:17 PM   Modules accepted: Orders, SmartSet

## 2015-03-11 LAB — HEPATITIS C ANTIBODY: HCV AB: NEGATIVE

## 2015-03-11 NOTE — Addendum Note (Signed)
Addended by: Marchia Bond on: 03/11/2015 02:54 PM   Modules accepted: Miquel Dunn

## 2015-03-13 ENCOUNTER — Telehealth: Payer: Self-pay | Admitting: Family Medicine

## 2015-03-13 DIAGNOSIS — E119 Type 2 diabetes mellitus without complications: Secondary | ICD-10-CM

## 2015-03-13 NOTE — Telephone Encounter (Signed)
-----   Message from Marchia Bond sent at 03/13/2015  3:26 PM EST ----- The specimen is to old to add, it will have to be redrawn.  Thanks Aniceto Boss ----- Message -----    From: Jinny Sanders, MD    Sent: 03/12/2015   5:37 PM      To: Ellamae Sia  Please add free and total PSA.

## 2015-03-13 NOTE — Telephone Encounter (Signed)
Notify pt labs show that he has a new diagnosis of diabetes. His cholesterol is poorly controlled. LDL needs to be at <100.  I would recommend referral to nutritionist, low carb diet, low chol diet and follow up with labs prior in 3 months. Mail info on diabetes.  Also his PSA was slightly elevated.. We need him to return ASAP for free and total PSA to evaluate further.

## 2015-03-17 NOTE — Addendum Note (Signed)
Addended byEliezer Lofts E on: 03/17/2015 12:00 PM   Modules accepted: Orders

## 2015-03-17 NOTE — Telephone Encounter (Signed)
referral ordered 

## 2015-03-17 NOTE — Telephone Encounter (Signed)
Dennis Hicks notified as instructed by telephone.  He had already reviewed his results via [place].  He is interested in the nutritionist referral.  Diabetes, low carb,  low cholesterol hand outs mailed to patient.  He will call back to schedule lab appointment for Free and Total PSA test.

## 2015-03-17 NOTE — Telephone Encounter (Signed)
Left message for Asaad to return my call.

## 2015-04-03 ENCOUNTER — Encounter: Payer: Self-pay | Admitting: Family Medicine

## 2015-04-30 ENCOUNTER — Ambulatory Visit: Payer: 59 | Admitting: *Deleted

## 2015-07-25 ENCOUNTER — Other Ambulatory Visit: Payer: Self-pay | Admitting: Family Medicine

## 2015-11-13 ENCOUNTER — Ambulatory Visit (INDEPENDENT_AMBULATORY_CARE_PROVIDER_SITE_OTHER): Payer: 59 | Admitting: Family Medicine

## 2015-11-13 ENCOUNTER — Ambulatory Visit: Payer: 59 | Admitting: Family Medicine

## 2015-11-13 ENCOUNTER — Encounter: Payer: Self-pay | Admitting: Family Medicine

## 2015-11-13 DIAGNOSIS — J209 Acute bronchitis, unspecified: Secondary | ICD-10-CM | POA: Diagnosis not present

## 2015-11-13 DIAGNOSIS — M545 Low back pain, unspecified: Secondary | ICD-10-CM

## 2015-11-13 DIAGNOSIS — G8929 Other chronic pain: Secondary | ICD-10-CM | POA: Diagnosis not present

## 2015-11-13 MED ORDER — HYDROCODONE-ACETAMINOPHEN 5-325 MG PO TABS: ORAL_TABLET | ORAL | 0 refills | Status: DC

## 2015-11-13 MED ORDER — AZITHROMYCIN 250 MG PO TABS: ORAL_TABLET | ORAL | 0 refills | Status: DC

## 2015-11-13 NOTE — Assessment & Plan Note (Signed)
Refilled hydrocodone.  NCSR unconcerning.  Using in limited fashion.

## 2015-11-13 NOTE — Patient Instructions (Signed)
Complete antibiotics.  Push fluids, rest.

## 2015-11-13 NOTE — Assessment & Plan Note (Signed)
Lung exam clear. Hemoptysis likely from oropharynx. Will complete course of antibiotics.

## 2015-11-13 NOTE — Progress Notes (Signed)
Pre visit review using our clinic review tool, if applicable. No additional management support is needed unless otherwise documented below in the visit note. 

## 2015-11-13 NOTE — Progress Notes (Signed)
   Subjective:    Patient ID: Dennis Hicks, male    DOB: 07-15-1955, 60 y.o.   MRN: [id number]  Cough  This is a new problem. The current episode started in the past 7 days. The problem has been gradually worsening. The problem occurs constantly. The cough is productive of sputum and productive of bloody sputum. Associated symptoms include ear congestion, a fever, nasal congestion and rhinorrhea. Pertinent negatives include no chills. Associated symptoms comments: Did not measure fever, lips chapped.. Risk factors: nonsmoker. He has tried prescription cough suppressant and OTC cough suppressant (took some amoxicillin  2 a day x 4 days, coricidin, antihistamine) for the symptoms. The treatment provided mild relief. His past medical history is significant for bronchitis and pneumonia. There is no history of asthma, bronchiectasis or COPD.  Fever   Associated symptoms include coughing.   Hurt back 2 weeks ago. Used hydrocodone as needed x 3 days. Improved now after swimming and stretching. No fall, no known injury. Put a lot of chlorine in his pool.    Review of Systems  Constitutional: Positive for fever. Negative for chills.  HENT: Positive for rhinorrhea.   Respiratory: Positive for cough.        Objective:   Physical Exam  Constitutional: Vital signs are normal. He appears well-developed and well-nourished.  HENT:  Head: Normocephalic.  Right Ear: Hearing normal.  Left Ear: Hearing normal.  Nose: Nose normal.  Mouth/Throat: Oropharynx is clear and moist and mucous membranes are normal.  Neck: Trachea normal. Carotid bruit is not present. No thyroid mass and no thyromegaly present.  Cardiovascular: Normal rate, regular rhythm and normal pulses.  Exam reveals no gallop, no distant heart sounds and no friction rub.   No murmur heard. No peripheral edema  Pulmonary/Chest: Effort normal and breath sounds normal. No respiratory distress.  Skin: Skin is warm, dry and intact. No rash  noted.  Psychiatric: He has a normal mood and affect. His speech is normal and behavior is normal. Thought content normal.          Assessment & Plan:

## 2015-11-26 ENCOUNTER — Other Ambulatory Visit (INDEPENDENT_AMBULATORY_CARE_PROVIDER_SITE_OTHER): Payer: 59

## 2015-11-26 ENCOUNTER — Telehealth: Payer: Self-pay | Admitting: Family Medicine

## 2015-11-26 DIAGNOSIS — E7800 Pure hypercholesterolemia, unspecified: Secondary | ICD-10-CM | POA: Diagnosis not present

## 2015-11-26 DIAGNOSIS — E119 Type 2 diabetes mellitus without complications: Secondary | ICD-10-CM | POA: Diagnosis not present

## 2015-11-26 DIAGNOSIS — R972 Elevated prostate specific antigen [PSA]: Secondary | ICD-10-CM | POA: Diagnosis not present

## 2015-11-26 LAB — COMPREHENSIVE METABOLIC PANEL
ALK PHOS: 58 U/L (ref 39–117)
ALT: 41 U/L (ref 0–53)
AST: 24 U/L (ref 0–37)
Albumin: 4.3 g/dL (ref 3.5–5.2)
BILIRUBIN TOTAL: 0.5 mg/dL (ref 0.2–1.2)
BUN: 22 mg/dL (ref 6–23)
CO2: 31 meq/L (ref 19–32)
CREATININE: 1.48 mg/dL (ref 0.40–1.50)
Calcium: 10.7 mg/dL — ABNORMAL HIGH (ref 8.4–10.5)
Chloride: 100 mEq/L (ref 96–112)
GFR: 51.74 mL/min — ABNORMAL LOW (ref >60.00)
GLUCOSE: 135 mg/dL — AB (ref 70–99)
Potassium: 4.5 mEq/L (ref 3.5–5.1)
SODIUM: 137 meq/L (ref 135–145)
TOTAL PROTEIN: 6.9 g/dL (ref 6.0–8.3)

## 2015-11-26 LAB — HEMOGLOBIN A1C: HEMOGLOBIN A1C: 6.7 % — AB (ref 4.6–6.5)

## 2015-11-26 LAB — LIPID PANEL
CHOL/HDL RATIO: 6
Cholesterol: 192 mg/dL (ref 0–200)
HDL: 33.2 mg/dL — ABNORMAL LOW (ref >39.00)
LDL Cholesterol: 130 mg/dL — ABNORMAL HIGH (ref 0–99)
NONHDL: 158.88
Triglycerides: 146 mg/dL (ref 0.0–149.0)
VLDL: 29.2 mg/dL (ref 0.0–40.0)

## 2015-11-26 LAB — PSA: PSA: 6.29 ng/mL — ABNORMAL HIGH (ref 0.10–4.00)

## 2015-11-26 NOTE — Telephone Encounter (Signed)
-----   Message from Ellamae Sia sent at 11/26/2015  9:48 AM EDT ----- Regarding: Lab orders for now Patient is scheduled for CPX labs, please order future labs, Thanks , Karna Christmas

## 2015-12-03 ENCOUNTER — Other Ambulatory Visit: Payer: 59

## 2015-12-04 ENCOUNTER — Ambulatory Visit (INDEPENDENT_AMBULATORY_CARE_PROVIDER_SITE_OTHER): Payer: 59 | Admitting: Family Medicine

## 2015-12-04 ENCOUNTER — Encounter: Payer: Self-pay | Admitting: Family Medicine

## 2015-12-04 ENCOUNTER — Telehealth: Payer: Self-pay | Admitting: Family Medicine

## 2015-12-04 VITALS — BP 112/66 | HR 82 | Temp 98.5°F | Ht 66.75 in | Wt 217.8 lb

## 2015-12-04 DIAGNOSIS — N183 Chronic kidney disease, stage 3 unspecified: Secondary | ICD-10-CM

## 2015-12-04 DIAGNOSIS — R972 Elevated prostate specific antigen [PSA]: Secondary | ICD-10-CM

## 2015-12-04 DIAGNOSIS — Z0000 Encounter for general adult medical examination without abnormal findings: Secondary | ICD-10-CM

## 2015-12-04 DIAGNOSIS — E7800 Pure hypercholesterolemia, unspecified: Secondary | ICD-10-CM | POA: Diagnosis not present

## 2015-12-04 DIAGNOSIS — E119 Type 2 diabetes mellitus without complications: Secondary | ICD-10-CM | POA: Diagnosis not present

## 2015-12-04 DIAGNOSIS — I10 Essential (primary) hypertension: Secondary | ICD-10-CM

## 2015-12-04 DIAGNOSIS — E0822 Diabetes mellitus due to underlying condition with diabetic chronic kidney disease: Secondary | ICD-10-CM

## 2015-12-04 DIAGNOSIS — Z23 Encounter for immunization: Secondary | ICD-10-CM

## 2015-12-04 DIAGNOSIS — E1122 Type 2 diabetes mellitus with diabetic chronic kidney disease: Secondary | ICD-10-CM

## 2015-12-04 NOTE — Telephone Encounter (Signed)
Notify pt that at appt we neglected to talk in detail about slight decrease in kidney function.  Recommend increasing fluids and recheck labs in 2 weeks for BMET.  If not improving will refer to nephrology.  Ma ke sure pt also has appt for re-eval PSA.Marland Kitchen Let me know if he needs another referral for this.

## 2015-12-04 NOTE — Assessment & Plan Note (Signed)
Reviewed diet and lifestyle change.  pt with strong family history of DM.  INfo given on lifstyle changes.  Recheck in 6 months.

## 2015-12-04 NOTE — Progress Notes (Signed)
Pre visit review using our clinic review tool, if applicable. No additional management support is needed unless otherwise documented below in the visit note. 

## 2015-12-04 NOTE — Progress Notes (Signed)
The patient is here for annual wellness exam and preventative care.   Seen at [facility].. For psoriasis.  Hypertension: Great control today on Lotensin  BP Readings from Last 3 Encounters:  12/04/15 112/66  11/13/15 120/68  03/10/15 135/70  Using medication without problems or lightheadedness: None  Chest pain with exertion: None  Edema:None  Short of breath:None  Average home BPs: rarely checking  Other issues: Good eating habits except not able to eat fish given migraines. No exercise except golf.   Wt Readings from Last 3 Encounters:  12/04/15 217 lb 12 oz (98.8 kg)  11/13/15 218 lb 8 oz (99.1 kg)  03/10/15 222 lb 8 oz (100.9 kg)    Elevated Cholesterol: LDL  NOT at goal <100 ( lower goal with DM) on zocor 20 mg daily  Lab Results  Component Value Date   CHOL 192 11/26/2015   HDL 33.20 (L) 11/26/2015   LDLCALC 130 (H) 11/26/2015   LDLDIRECT 158.2 01/03/2013   TRIG 146.0 11/26/2015   CHOLHDL 6 11/26/2015   Using medications without problems:  Muscle aches: None  Other complaints:  Ttravelling a lot. No exercise.  Diet: poor when traveling.  Elevated PSA.. 2011 referred to Dr., Olena Heckle for increase in PSA over 1 year period 1 up to 4s  Has prostate biopsy 10/2009.. Negative.  PSA has continued to trend up.  Uses vicodin in spurts for several days every few months, for low back pain and right wrist pain and or right knee pain. where he has calcium deposit.   Migraine: Last 2-3 days at a time... Using 1 tabs a day on those days. Has migraine every few months. Knows triggers.  New diagnosis DM inlast year. On no med. Lab Results  Component Value Date   HGBA1C 6.7 (H) 11/26/2015    Renal insufficiency: NSAIDs. Had SE to NSAIDs in past.  Social History /Family History/Past Medical History reviewed and updated if needed.  Review of Systems  Constitutional: Negative for fever, fatigue and unexpected weight change.  HENT: Negative for ear pain,  congestion, sore throat, rhinorrhea, trouble swallowing and postnasal drip.  Eyes: Negative for pain.  Respiratory: Negative for cough, shortness of breath and wheezing.  Cardiovascular: Negative for chest pain, palpitations and leg swelling.  Gastrointestinal: Negative for nausea, abdominal pain, diarrhea, constipation and blood in stool.  Genitourinary: Negative for dysuria, urgency, hematuria, discharge, penile swelling, scrotal swelling, difficulty urinating, penile pain and testicular pain.  Skin: Negative for rash.  Neurological: Negative for syncope, weakness, light-headedness, numbness and headaches.  Psychiatric/Behavioral: Negative for behavioral problems and dysphoric mood. The patient is not nervous/anxious.  Objective:   Physical Exam  Constitutional: He appears well-developed and well-nourished. Non-toxic appearance. He does not appear ill. No distress.  HENT:  Head: Normocephalic and atraumatic.  Right Ear: Hearing, tympanic membrane, external ear and ear canal normal.  Left Ear: Hearing, tympanic membrane, external ear and ear canal normal.  Nose: Nose normal.  Mouth/Throat: Uvula is midline, oropharynx is clear and moist and mucous membranes are normal.  Eyes: Conjunctivae, EOM and lids are normal. Pupils are equal, round, and reactive to light. No foreign bodies found.  Neck: Trachea normal, normal range of motion and phonation normal. Carotid bruit is not present. No mass and no thyromegaly present.  TTP on left trapezius, no vertebral ttp, decrease lateral ROM, neg Spurling's test.  Cardiovascular: Normal rate, regular rhythm, S1 normal, S2 normal, intact distal pulses and normal pulses. Exam reveals no gallop.  No murmur heard.  Pulmonary/Chest: Breath sounds normal. He has no wheezes. He has no rhonchi. He has no rales.  Abdominal: Soft. Normal appearance and bowel sounds are normal. There is no hepatosplenomegaly. There is no tenderness. There is  no rebound, no guarding and no CVA tenderness. No hernia. Hernia confirmed negative in the right inguinal area and confirmed negative in the left inguinal area.  Genitourinary: Testes normal and penis normal. Rectal exam shows no external hemorrhoid, no internal hemorrhoid, no fissure, no mass, no tenderness and anal tone normal. Guaiac negative stool. Prostate is enlarged. Prostate is not tender. Right testis shows no mass and no tenderness. Left testis shows no mass and no tenderness. No paraphimosis or penile tenderness.  No prostate nodules  Lymphadenopathy:  He has no cervical adenopathy.  Right: No inguinal adenopathy present.  Left: No inguinal adenopathy present.  Neurological: He is alert. He has normal strength and normal reflexes. No cranial nerve deficit or sensory deficit. Gait normal.  Skin: Skin is warm, dry and intact. Dark irregualr lesion noted on right chest, present long time. Psychiatric: He has a normal mood and affect. His speech is normal and behavior is normal. Judgment normal.  Assessment & Plan:   Complete Physical Exam: The patient's preventative maintenance and recommended screening tests for an annual wellness exam were reviewed in full today.  Brought up to date unless services declined.  Counselled on the importance of diet, exercise, and its role in overall health and mortality.  The patient's FH and SH was reviewed, including their home life, tobacco status, and drug and alcohol status.   Colon: great aunt and uncle with colon cancer, no first degree relative. Plans to do iFOB yearly. Vaccines Refused Tdap, feel had on in last 10 years and given flu. PSA:  Needs to return to see urology. Lab Results  Component Value Date   PSA 6.29 (H) 11/26/2015   PSA 5.34 (H) 03/10/2015   PSA 3.76 03/03/2014   HIV: Refused. Nonsmoker Hep C: Done.

## 2015-12-04 NOTE — Assessment & Plan Note (Signed)
Well controlled. Continue current medication.  

## 2015-12-04 NOTE — Assessment & Plan Note (Signed)
Not at goal  On zocor 20 mg daily.  Increase to 40 mg daily.

## 2015-12-04 NOTE — Assessment & Plan Note (Signed)
Recommend ASAP return to urologist. Pt will call to schedule.

## 2015-12-04 NOTE — Assessment & Plan Note (Addendum)
Pt on ACEI. Needs better DM control. Avoid nephrotoxic meds.  Given slight worsening, increase fluids and recheck.  Consider referral to nephrologist.

## 2015-12-04 NOTE — Patient Instructions (Addendum)
Decrease alcohol to lower sugar. Try melatonin or valerian root instead for sleep. Increase exercise. Work on low carb, low cholesterol diet. Increase simvastatin to 40 mg daily. Return to see urologist for increase prostate levels.

## 2015-12-07 NOTE — Telephone Encounter (Signed)
Mr. Dennis Hicks notified as instructed by telephone.  Lab appointment scheduled for 12/21/15 at 8:40 am to recheck BMET.  Dennis Hicks states he still needs to call and schedule appointment with his Urologist to re-evaluate PSA.

## 2015-12-08 ENCOUNTER — Other Ambulatory Visit: Payer: 59

## 2015-12-18 ENCOUNTER — Telehealth: Payer: Self-pay | Admitting: Family Medicine

## 2015-12-18 NOTE — Telephone Encounter (Signed)
Health Assessment form completed and placed in Dr. Rometta Emery in box for signature.

## 2015-12-18 NOTE — Telephone Encounter (Signed)
Patient brought in a physician's results form to be filled out.  It is located in the prescription incoming box for the physician.

## 2015-12-21 ENCOUNTER — Other Ambulatory Visit (INDEPENDENT_AMBULATORY_CARE_PROVIDER_SITE_OTHER): Payer: 59

## 2015-12-21 DIAGNOSIS — R7989 Other specified abnormal findings of blood chemistry: Secondary | ICD-10-CM

## 2015-12-21 DIAGNOSIS — Z1211 Encounter for screening for malignant neoplasm of colon: Secondary | ICD-10-CM

## 2015-12-21 LAB — BASIC METABOLIC PANEL
BUN: 17 mg/dL (ref 6–23)
CALCIUM: 9.1 mg/dL (ref 8.4–10.5)
CHLORIDE: 103 meq/L (ref 96–112)
CO2: 28 mEq/L (ref 19–32)
CREATININE: 1.29 mg/dL (ref 0.40–1.50)
GFR: 60.62 mL/min (ref >60.00)
Glucose, Bld: 135 mg/dL — ABNORMAL HIGH (ref 70–99)
Potassium: 4.4 mEq/L (ref 3.5–5.1)
Sodium: 137 mEq/L (ref 135–145)

## 2015-12-22 NOTE — Telephone Encounter (Signed)
From completed and faxed to [facility] at [phone].

## 2016-01-21 ENCOUNTER — Other Ambulatory Visit: Payer: Self-pay | Admitting: Family Medicine

## 2016-01-27 ENCOUNTER — Other Ambulatory Visit: Payer: Self-pay | Admitting: Family Medicine

## 2016-01-28 NOTE — Telephone Encounter (Signed)
Last office visit 12/04/2015. Last refilled 03/10/2015 for 30 g with no refills. Ok to refill?

## 2016-01-29 MED ORDER — BETAMETHASONE DIPROPIONATE 0.05 % EX CREA: 1.0000 "application " | TOPICAL_CREAM | Freq: Two times a day (BID) | CUTANEOUS | 0 refills | Status: DC

## 2016-04-26 ENCOUNTER — Encounter: Payer: Self-pay | Admitting: Family Medicine

## 2016-04-26 ENCOUNTER — Ambulatory Visit (INDEPENDENT_AMBULATORY_CARE_PROVIDER_SITE_OTHER): Payer: 59 | Admitting: Family Medicine

## 2016-04-26 VITALS — BP 114/78 | HR 106 | Temp 98.3°F | Ht 66.75 in | Wt 219.5 lb

## 2016-04-26 DIAGNOSIS — R972 Elevated prostate specific antigen [PSA]: Secondary | ICD-10-CM

## 2016-04-26 DIAGNOSIS — N029 Recurrent and persistent hematuria with unspecified morphologic changes: Secondary | ICD-10-CM | POA: Insufficient documentation

## 2016-04-26 DIAGNOSIS — E1122 Type 2 diabetes mellitus with diabetic chronic kidney disease: Secondary | ICD-10-CM

## 2016-04-26 DIAGNOSIS — N183 Chronic kidney disease, stage 3 (moderate): Secondary | ICD-10-CM

## 2016-04-26 LAB — POC URINALSYSI DIPSTICK (AUTOMATED)
Bilirubin, UA: NEGATIVE
Glucose, UA: NEGATIVE
Ketones, UA: NEGATIVE
Leukocytes, UA: NEGATIVE
Nitrite, UA: NEGATIVE
PH UA: 7
PROTEIN UA: NEGATIVE
RBC UA: NEGATIVE
SPEC GRAV UA: 1.01
UROBILINOGEN UA: 0.2

## 2016-04-26 NOTE — Assessment & Plan Note (Signed)
UA clear. No sign of UTI or prostatitis, no symptoms of stone as no pain.  Pt is using aspirin more lately.  Will send for cbc and renal panel.  refer to urology for further eval.

## 2016-04-26 NOTE — Assessment & Plan Note (Signed)
Re-eval... Follow up with uro  For this as well if increasing.

## 2016-04-26 NOTE — Addendum Note (Signed)
Addended by: Ellamae Sia on: 04/26/2016 12:03 PM   Modules accepted: Orders

## 2016-04-26 NOTE — Progress Notes (Signed)
Pre visit review using our clinic review tool, if applicable. No additional management support is needed unless otherwise documented below in the visit note. 

## 2016-04-26 NOTE — Progress Notes (Signed)
   Subjective:    Patient ID: Dennis Hicks, male    DOB: 04-23-1955, 61 y.o.   MRN: [id number]  HPI  61 year old male with history of DM, CKD stage 3, ED likely due to DM presents with new onset hematuria.  He reports yesterday at work.. Noted drop of blood in toilet, was not sure if it was from him. Later that day.. First drops of urine diluted bloody urine.  Last week he had some difficulty urinating, had to push urine out slightly lasted 3 days.  Improved with drinking water.  No dysuria. No CVA tenderness, no abd pain.  Felt warm yesterday. Also has had some throat soreness.. Now resolved  No groin pain.  No swelling in ankles.   On benazepril HCTZ.  Recently (4 weeks ago) has used muscle relaxant and hydrocodone APAP for neck pain, right shoulder pain.  He does take 8 baby aspirin a day for 2-3 days off and on. Last took aspirin  over the weekend.   History of elevated PSA.. never returned to urologist.   BP Readings from Last 3 Encounters:  04/26/16 114/78  12/04/15 112/66  11/13/15 120/68   Lab Results  Component Value Date   HGBA1C 6.7 (H) 11/26/2015   Wt Readings from Last 3 Encounters:  04/26/16 219 lb 8 oz (99.6 kg)  12/04/15 217 lb 12 oz (98.8 kg)  11/13/15 218 lb 8 oz (99.1 kg)      Vitals:   04/26/16 1047  BP: 114/78  Pulse: (!) 106  Temp: 98.3 F (36.8 C)  TempSrc: Oral  Weight: 219 lb 8 oz (99.6 kg)  Height: 5' 6.75" (1.695 m)   Review of Systems  Constitutional: Negative for fatigue and fever.  HENT: Negative for ear pain.   Eyes: Negative for pain.  Respiratory: Negative for cough and shortness of breath.   Cardiovascular: Negative for chest pain, palpitations and leg swelling.  Gastrointestinal: Negative for abdominal pain.  Genitourinary: Negative for dysuria.  Musculoskeletal: Negative for arthralgias.  Neurological: Negative for syncope, light-headedness and headaches.  Psychiatric/Behavioral: Negative for dysphoric mood.         Objective:   Physical Exam  Constitutional: Vital signs are normal. He appears well-developed and well-nourished.  HENT:  Head: Normocephalic.  Right Ear: Hearing normal.  Left Ear: Hearing normal.  Nose: Nose normal.  Mouth/Throat: Oropharynx is clear and moist and mucous membranes are normal.  Neck: Trachea normal. Carotid bruit is not present. No thyroid mass and no thyromegaly present.  Cardiovascular: Normal rate, regular rhythm and normal pulses.  Exam reveals no gallop, no distant heart sounds and no friction rub.   No murmur heard. No peripheral edema  Pulmonary/Chest: Effort normal and breath sounds normal. No respiratory distress.  Abdominal: There is no hepatosplenomegaly, splenomegaly or hepatomegaly. There is no tenderness. There is no rebound and no CVA tenderness. No hernia.  Skin: Skin is warm, dry and intact. No rash noted.  Psychiatric: He has a normal mood and affect. His speech is normal and behavior is normal. Thought content normal.          Assessment & Plan:

## 2016-04-26 NOTE — Patient Instructions (Addendum)
Stop at lab on way out.  Call urologist Dr. Arcola Jansky to make appt for hematuria and elevated PSA.  Stop aspirin. Use tyelenol if need for pain but do not go over 4000 mg daily if using hydrocodone APAP as well,

## 2016-04-27 LAB — CBC WITH DIFFERENTIAL/PLATELET
Basophils Absolute: 0.1 10*3/uL (ref 0.0–0.2)
Basos: 1 %
EOS (ABSOLUTE): 0.2 10*3/uL (ref 0.0–0.4)
EOS: 2 %
HEMOGLOBIN: 16.5 g/dL (ref 13.0–17.7)
Hematocrit: 47.4 % (ref 37.5–51.0)
Immature Grans (Abs): 0.1 10*3/uL (ref 0.0–0.1)
Immature Granulocytes: 1 %
LYMPHS ABS: 2.4 10*3/uL (ref 0.7–3.1)
Lymphs: 25 %
MCH: 30.1 pg (ref 26.6–33.0)
MCHC: 34.8 g/dL (ref 31.5–35.7)
MCV: 87 fL (ref 79–97)
MONOS ABS: 0.7 10*3/uL (ref 0.1–0.9)
Monocytes: 7 %
NEUTROS ABS: 6.2 10*3/uL (ref 1.4–7.0)
Neutrophils: 64 %
Platelets: 328 10*3/uL (ref 150–379)
RBC: 5.48 x10E6/uL (ref 4.14–5.80)
RDW: 13.6 % (ref 12.3–15.4)
WBC: 9.6 10*3/uL (ref 3.4–10.8)

## 2016-04-27 LAB — RENAL FUNCTION PANEL
Albumin: 4.6 g/dL (ref 3.5–5.5)
BUN / CREAT RATIO: 10 (ref 9–20)
BUN: 14 mg/dL (ref 6–24)
CALCIUM: 10.8 mg/dL — AB (ref 8.7–10.2)
CHLORIDE: 95 mmol/L — AB (ref 96–106)
CO2: 23 mmol/L (ref 18–29)
Creatinine, Ser: 1.37 mg/dL — ABNORMAL HIGH (ref 0.76–1.27)
GFR calc non Af Amer: 56 mL/min/{1.73_m2} — ABNORMAL LOW (ref >59)
GFR, EST AFRICAN AMERICAN: 65 mL/min/{1.73_m2} (ref >59)
Glucose: 114 mg/dL — ABNORMAL HIGH (ref 65–99)
Phosphorus: 3.4 mg/dL (ref 2.5–4.5)
Potassium: 4 mmol/L (ref 3.5–5.2)
SODIUM: 136 mmol/L (ref 134–144)

## 2016-04-27 LAB — PSA, TOTAL AND FREE
PSA FREE: 0.69 ng/mL
PSA, Free Pct: 9.3 %
Prostate Specific Ag, Serum: 7.4 ng/mL — ABNORMAL HIGH (ref 0.0–4.0)

## 2016-05-03 DIAGNOSIS — L821 Other seborrheic keratosis: Secondary | ICD-10-CM | POA: Diagnosis not present

## 2016-05-03 DIAGNOSIS — Z1283 Encounter for screening for malignant neoplasm of skin: Secondary | ICD-10-CM | POA: Diagnosis not present

## 2016-05-03 DIAGNOSIS — Z85820 Personal history of malignant melanoma of skin: Secondary | ICD-10-CM | POA: Diagnosis not present

## 2016-05-12 ENCOUNTER — Other Ambulatory Visit: Payer: Self-pay | Admitting: *Deleted

## 2016-05-12 DIAGNOSIS — R3129 Other microscopic hematuria: Secondary | ICD-10-CM

## 2016-05-13 ENCOUNTER — Encounter: Payer: Self-pay | Admitting: Urology

## 2016-05-13 ENCOUNTER — Ambulatory Visit (INDEPENDENT_AMBULATORY_CARE_PROVIDER_SITE_OTHER): Payer: 59 | Admitting: Urology

## 2016-05-13 ENCOUNTER — Other Ambulatory Visit
Admission: RE | Admit: 2016-05-13 | Discharge: 2016-05-13 | Disposition: A | Discharge status: Home / Self Care | Payer: 59 | Source: Ambulatory Visit | Attending: Urology | Admitting: Urology

## 2016-05-13 VITALS — BP 123/80 | HR 95 | Ht 67.0 in | Wt 219.0 lb

## 2016-05-13 DIAGNOSIS — R310 Gross hematuria: Secondary | ICD-10-CM

## 2016-05-13 DIAGNOSIS — R972 Elevated prostate specific antigen [PSA]: Secondary | ICD-10-CM

## 2016-05-13 DIAGNOSIS — R3129 Other microscopic hematuria: Secondary | ICD-10-CM | POA: Diagnosis not present

## 2016-05-13 DIAGNOSIS — N029 Recurrent and persistent hematuria with unspecified morphologic changes: Secondary | ICD-10-CM | POA: Diagnosis not present

## 2016-05-13 LAB — URINALYSIS, COMPLETE (UACMP) WITH MICROSCOPIC
BILIRUBIN URINE: NEGATIVE
Bacteria, UA: NONE SEEN
GLUCOSE, UA: NEGATIVE mg/dL
Hgb urine dipstick: NEGATIVE
Ketones, ur: NEGATIVE mg/dL
Leukocytes, UA: NEGATIVE
NITRITE: NEGATIVE
PH: 7.5 (ref 5.0–8.0)
Protein, ur: NEGATIVE mg/dL
RBC / HPF: NONE SEEN RBC/hpf (ref 0–5)
SPECIFIC GRAVITY, URINE: 1.015 (ref 1.005–1.030)
Squamous Epithelial / LPF: NONE SEEN

## 2016-05-13 NOTE — Progress Notes (Signed)
05/13/2016 10:22 AM   Dennis Hicks 10/16/1955 [id number]  Referring provider: Jinny Sanders, MD [address] [place], [place] [address]  Chief Complaint  Patient presents with  . New Patient (Initial Visit)    idiopathic hematuria and elevated psa referred by Dr. Diona Browner    HPI: Patient is a 61 year old Caucasian male who is referred to [place] by his PCP, Dr. Diona Browner, for gross hematuria and an elevated PSA.  Patient had episodes of gross hematuria on two occassions.  He attributes this occurrence to taking large amounts of ASA to stave off a migraine.  He has not had episodes of gross hematuria since that time.  Patient doesn't have a prior history of microscopic hematuria.   He does not have a prior history of recurrent urinary tract infections, nephrolithiasis, trauma to the genitourinary tract or malignancies of the genitourinary tract.  He does not have a family medical history of nephrolithiasis, malignancies of the genitourinary tract or hematuria.   Today, he is not having symptoms of frequent urination, urgency, dysuria, nocturia, incontinence, hesitancy, intermittency, straining to urinate or a weak urinary stream.   She is not experiencing any suprapubic pain, abdominal pain or flank pain. He/She denies any recent fevers, chills, nausea or vomiting.   She has not had any recent imaging studies.   He is not a smoker.    His IPSS score today is 3, which is mild lower urinary tract symptomatology.  He is pleased with his quality life due to his urinary symptoms.  He denies any dysuria, hematuria or suprapubic pain.   His has had a prostate biopsy in the past with Dr. Olena Heckle and it was negative.  He states his PSA was around 5 at that time.        IPSS    Row Name 05/13/16 0900         International Prostate Symptom Score   How often have you had the sensation of not emptying your bladder? Not at All     How often have you had to urinate less than every two hours?  Less than 1 in 5 times     How often have you found you stopped and started again several times when you urinated? Less than 1 in 5 times     How often have you found it difficult to postpone urination? Not at All     How often have you had a weak urinary stream? Not at All     How often have you had to strain to start urination? Not at All     How many times did you typically get up at night to urinate? 1 Time     Total IPSS Score 3       Quality of Life due to urinary symptoms   If you were to spend the rest of your life with your urinary condition just the way it is now how would you feel about that? Pleased        Score:  1-7 Mild 8-19 Moderate 20-35 Severe  His SHIM score is 24, which is no erectile dysfunction.  He did take Viagra in the distant past, but he does not need the medication to achieve satisfactory intercourse.  His libido is preserved.   His risk factors for ED are age, BPH, DM, HTN, HLD and blood pressure medications.    He denies any painful erections or curvatures with his erections.  SHIM    Row Name 05/13/16 [phone]         SHIM: Over the last 6 months:   How do you rate your confidence that you could get and keep an erection? High     When you had erections with sexual stimulation, how often were your erections hard enough for penetration (entering your partner)? Almost Always or Always     During sexual intercourse, how often were you able to maintain your erection after you had penetrated (entered) your partner? Not Difficult     During sexual intercourse, how difficult was it to maintain your erection to completion of intercourse? Not Difficult     When you attempted sexual intercourse, how often was it satisfactory for you? Not Difficult       SHIM Total Score   SHIM 24        Score: 1-7 Severe ED 8-11 Moderate ED 12-16 Mild-Moderate ED 17-21 Mild ED 22-25 No ED      PMH: Past Medical History:  Diagnosis Date  . Borderline diabetes     . Heartburn   . HLD (hyperlipidemia)   . Hypertension   . Melanoma ([place])   . Psoriasis     Surgical History: History reviewed. No pertinent surgical history.  Home Medications:  Allergies as of 05/13/2016      Reactions   Chocolate Other (See Comments)   Migraine   Shrimp [shellfish Allergy] Other (See Comments)   Migraines      Medication List       Accurate as of 05/13/16 10:22 AM. Always use your most recent med list.          benazepril-hydrochlorthiazide 20-25 MG tablet Commonly known as:  LOTENSIN HCT TAKE 1 TABLET DAILY   betamethasone dipropionate 0.05 % cream Commonly known as:  DIPROLENE Apply 1 application topically 2 (two) times daily.   famotidine 10 MG chewable tablet Commonly known as:  PEPCID AC Chew 10 mg by mouth at bedtime as needed.   HYDROcodone-acetaminophen 5-325 MG tablet Commonly known as:  NORCO/VICODIN Take one tablet every 4 to 6 hours as needed for pain.   simvastatin 20 MG tablet Commonly known as:  ZOCOR TAKE 1 TABLET DAILY   SUMAtriptan 100 MG tablet Commonly known as:  IMITREX TAKE 1 TABLET BY MOUTH EVERY 2 HOURS AS NEEDED( MAX OF 2 TABS IN 24 HOURS)   VIAGRA 100 MG tablet Generic drug:  sildenafil TAKE 1 TABLET AS NEEDED FOR ERECTILE DYSFUNCTION       Allergies:  Allergies  Allergen Reactions  . Chocolate Other (See Comments)    Migraine  . Shrimp [Shellfish Allergy] Other (See Comments)    Migraines    Family History: Family History  Problem Relation Age of Onset  . Hypertension Mother   . Coronary artery disease Father   . Hypertension Father   . Hyperlipidemia Father   . Diabetes Father   . Prostate cancer Neg Hx   . Kidney cancer Neg Hx   . Bladder Cancer Neg Hx     Social History:  reports that he has never smoked. He has never used smokeless tobacco. He reports that he drinks alcohol. He reports that he does not use drugs.  ROS: UROLOGY Frequent Urination?: No Hard to postpone urination?:  No Burning/pain with urination?: No Get up at night to urinate?: No Leakage of urine?: No Urine stream starts and stops?: No Trouble starting stream?: No Do you have to strain to urinate?: No Blood  in urine?: Yes Urinary tract infection?: No Sexually transmitted disease?: No Injury to kidneys or bladder?: No Painful intercourse?: No Weak stream?: No Erection problems?: No Penile pain?: No  Gastrointestinal Nausea?: No Vomiting?: No Indigestion/heartburn?: Yes Diarrhea?: No Constipation?: No  Constitutional Fever: No Night sweats?: No Weight loss?: No Fatigue?: No  Skin Skin rash/lesions?: No Itching?: Yes  Eyes Blurred vision?: No Double vision?: No  Ears/Nose/Throat Sore throat?: No Sinus problems?: No  Hematologic/Lymphatic Swollen glands?: No Easy bruising?: No  Cardiovascular Leg swelling?: No Chest pain?: No  Respiratory Cough?: No Shortness of breath?: No  Endocrine Excessive thirst?: No  Musculoskeletal Back pain?: No Joint pain?: No  Neurological Headaches?: No Dizziness?: No  Psychologic Depression?: No Anxiety?: No  Physical Exam: BP 123/80   Pulse 95   Ht 5\' 7"  (1.702 m)   Wt 219 lb (99.3 kg)   BMI 34.30 kg/m   Constitutional: Well nourished. Alert and oriented, No acute distress. HEENT:  AT, moist mucus membranes. Trachea midline, no masses. Cardiovascular: No clubbing, cyanosis, or edema. Respiratory: Normal respiratory effort, no increased work of breathing. GI: Abdomen is soft, non tender, non distended, no abdominal masses. Liver and spleen not palpable.  No hernias appreciated.  Stool sample for occult testing is not indicated.   GU: No CVA tenderness.  No bladder fullness or masses.  Patient with circumcised phallus.   Urethral meatus is patent.  No penile discharge. No penile lesions or rashes. Scrotum without lesions, cysts, rashes and/or edema.  Testicles are located scrotally bilaterally. No masses are  appreciated in the testicles. Left and right epididymis are normal. Rectal: Patient with  normal sphincter tone. Anus and perineum without scarring or rashes. No rectal masses are appreciated. Prostate is approximately 50 grams, no nodules are appreciated. Seminal vesicles are normal. Skin: No rashes, bruises or suspicious lesions. Lymph: No cervical or inguinal adenopathy. Neurologic: Grossly intact, no focal deficits, moving all 4 extremities. Psychiatric: Normal mood and affect.  Laboratory Data: Lab Results  Component Value Date   WBC 9.6 04/26/2016   HCT 47.4 04/26/2016   MCV 87 04/26/2016   PLT 328 04/26/2016    Lab Results  Component Value Date   CREATININE 1.37 (H) 04/26/2016    Lab Results  Component Value Date   PSA 6.29 (H) 11/26/2015   PSA 5.34 (H) 03/10/2015   PSA 3.76 03/03/2014    Lab Results  Component Value Date   HGBA1C 6.7 (H) 11/26/2015       Component Value Date/Time   CHOL 192 11/26/2015 1339   HDL 33.20 (L) 11/26/2015 1339   CHOLHDL 6 11/26/2015 1339   VLDL 29.2 11/26/2015 1339   LDLCALC 130 (H) 11/26/2015 1339    Lab Results  Component Value Date   AST 24 11/26/2015   Lab Results  Component Value Date   ALT 41 11/26/2015    Assessment & Plan:   1. Elevated PSA  - I discussed with the patient that PSA is an acronym for  prostate specific antigen,  which is a protein made by the prostate gland and can be detected in the blood stream. I explained to the patient situations that would increase the PSA, such as: a man's age,  BPH, infection, recent intercourse/ejaculation, prostate infarction, recent urethroscopic manipulation (Foley placement/cystoscopy) and prostate cancer.   - We discussed that indications for prostate biopsy are defined by age and race specific PSA cutoffs as well as a PSA velocity of 0.75/year - his PSA has increased at  a greater rate  - Free and total PSA completed on 04/26/2016 note a 56% probability of having prostate  cancer  - I have recommend that he undergo a biopsy at this time  - Patient will be schedule for a TRUSPBx of prostate.  The procedure is explained and the risks involved, such as blood in urine, blood in stool, blood in semen, infection, urinary retention, and on rare occasions sepsis and death.  Patient understands the risks as explained to him and he wishes to proceed.  Patient takes ASA for HA and is advised to discontinue the medication ten days prior to the biopsy.  2. Gross hematuria  - I explained to the patient that there are a number of causes that can be associated with blood in the urine, such as stones, BPH, UTI's, damage to the urinary tract and/or cancer.  - At this time, I felt that the patient warranted further urologic evaluation.   The AUA guidelines state that a CT urogram is the preferred imaging study to evaluate hematuria.  - I explained to the patient that a contrast material will be injected into a vein and that in rare instances, an allergic reaction can result and may even life threatening   The patient denies any allergies to contrast, iodine and/or seafood and is not taking metformin.  - Following the imaging study,  I've recommended a cystoscopy. I described how this is performed, typically in an office setting with a flexible cystoscope. We described the risks, benefits, and possible side effects, the most common of which is a minor amount of blood in the urine and/or burning which usually resolves in 24 to 48 hours.    - The patient had the opportunity to ask questions which were answered. Based upon this discussion, the patient is willing to proceed. Therefore, I've ordered: a CT Urogram and cystoscopy.  - The patient will return following all of the above for discussion of the results.   - UA   -BUN + creatinine     Return for CT Urogram report and cystoscopy and TRUSPBx of prostate.  These notes generated with voice recognition software. I apologize for  typographical errors.  Zara Council, [facility] [address], [place], [place] [address] [phone]

## 2016-05-13 NOTE — Patient Instructions (Addendum)
Hematuria, Adult Hematuria is blood in your urine. It can be caused by a bladder infection, kidney infection, prostate infection, kidney stone, or cancer of your urinary tract. Infections can usually be treated with medicine, and a kidney stone usually will pass through your urine. If neither of these is the cause of your hematuria, further workup to find out the reason may be needed. It is very important that you tell your health care provider about any blood you see in your urine, even if the blood stops without treatment or happens without causing pain. Blood in your urine that happens and then stops and then happens again can be a symptom of a very serious condition. Also, pain is not a symptom in the initial stages of many urinary cancers. Follow these instructions at home:  Drink lots of fluid, 3-4 quarts a day. If you have been diagnosed with an infection, cranberry juice is especially recommended, in addition to large amounts of water.  Avoid caffeine, tea, and carbonated beverages because they tend to irritate the bladder.  Avoid alcohol because it may irritate the prostate.  Take all medicines as directed by your health care provider.  If you were prescribed an antibiotic medicine, finish it all even if you start to feel better.  If you have been diagnosed with a kidney stone, follow your health care provider's instructions regarding straining your urine to catch the stone.  Empty your bladder often. Avoid holding urine for long periods of time.  After a bowel movement, women should cleanse front to back. Use each tissue only once.  Empty your bladder before and after sexual intercourse if you are a male. Contact a health care provider if:  You develop back pain.  You have a fever.  You have a feeling of sickness in your stomach (nausea) or vomiting.  Your symptoms are not better in 3 days. Return sooner if you are getting worse. Get help right away if:  You develop  severe vomiting and are unable to keep the medicine down.  You develop severe back or abdominal pain despite taking your medicines.  You begin passing a large amount of blood or clots in your urine.  You feel extremely weak or faint, or you pass out. This information is not intended to replace advice given to you by your health care provider. Make sure you discuss any questions you have with your health care provider. Document Released: 03/07/2005 Document Revised: 08/13/2015 Document Reviewed: 11/05/2012 Elsevier Interactive Patient Education  2017 [place] A computed tomography (CT) scan is a specialized X-ray scan. It uses X-rays and a computer to make pictures of different areas of your body. A CT scan can offer more detailed information than a regular X-ray exam. The CT scan provides data about internal organs, soft tissue structures, blood vessels, and bones.  The CT scanner is a large machine that takes pictures of your body as you move through the opening.  LET [facility] CARE PROVIDER KNOW ABOUT:  Any allergies you have.   All medicines you are taking, including vitamins, herbs, eye drops, creams, and over-the-counter medicines.   Previous problems you or members of your family have had with the use of anesthetics.   Any blood disorders you have.   Previous surgeries you have had.   Medical conditions you have. RISKS AND COMPLICATIONS  Generally, this is a safe procedure. However, as with any procedure, problems can occur. Possible problems include:   An allergic reaction  to the contrast material.   Development of cancer from excessive exposure to radiation. The risk of this is small.  BEFORE THE PROCEDURE   The day before the test, stop drinking caffeinated beverages. These include energy drinks, tea, soda, coffee, and hot chocolate.   On the day of the test:  About 4 hours before the test, stop eating and drinking anything but water as advised  by your health care provider.   Avoid wearing jewelry. You will have to partly or fully undress and wear a hospital gown. PROCEDURE   You will be asked to lie on a table with your arms above your head.   If contrast dye is to be used for the test, an IV tube will be inserted in your arm. The contrast dye will be injected into the IV tube. You might feel warm, or you may get a metallic taste in your mouth.   The table you will be lying on will move into a large machine that will do the scanning.   You will be able to see, hear, and talk to the person running the machine while you are in it. Follow that person's directions.   The CT machine will move around you to take pictures. Do not move while it is scanning. This helps to get a good image.   When the best possible pictures have been taken, the machine will be turned off. The table will be moved out of the machine. The IV tube will then be removed. AFTER THE PROCEDURE  Ask your health care provider when to follow up for your test results. This information is not intended to replace advice given to you by your health care provider. Make sure you discuss any questions you have with your health care provider. Document Released: 04/14/2004 Document Revised: 03/12/2013 Document Reviewed: 11/12/2012 Elsevier Interactive Patient Education  2017 [place].  Cystoscopy Cystoscopy is a procedure that is used to help diagnose and sometimes treat conditions that affect that lower urinary tract. The lower urinary tract includes the bladder and the tube that drains urine from the bladder out of the body (urethra). Cystoscopy is performed with a thin, tube-shaped instrument with a light and camera at the end (cystoscope). The cystoscope may be hard (rigid) or flexible, depending on the goal of the procedure.The cystoscope is inserted through the urethra, into the bladder. Cystoscopy may be recommended if you have:  Urinary tractinfections that  keep coming back (recurring).  Blood in the urine (hematuria).  Loss of bladder control (urinary incontinence) or an overactive bladder.  Unusual cells found in a urine sample.  A blockage in the urethra.  Painful urination.  An abnormality in the bladder found during an intravenous pyelogram (IVP) or CT scan. Cystoscopy may also be done to remove a sample of tissue to be examined under a microscope (biopsy). Tell a health care provider about:  Any allergies you have.  All medicines you are taking, including vitamins, herbs, eye drops, creams, and over-the-counter medicines.  Any problems you or family members have had with anesthetic medicines.  Any blood disorders you have.  Any surgeries you have had.  Any medical conditions you have.  Whether you are pregnant or may be pregnant. What are the risks? Generally, this is a safe procedure. However, problems may occur, including:  Infection.  Bleeding.  Allergic reactions to medicines.  Damage to other structures or organs. What happens before the procedure?  Ask your health care provider about:  Changing  or stopping your regular medicines. This is especially important if you are taking diabetes medicines or blood thinners.  Taking medicines such as aspirin and ibuprofen. These medicines can thin your blood. Do not take these medicines before your procedure if your health care provider instructs you not to.  Follow instructions from your health care provider about eating or drinking restrictions.  You may be given antibiotic medicine to help prevent infection.  You may have an exam or testing, such as X-rays of the bladder, urethra, or kidneys.  You may have urine tests to check for signs of infection.  Plan to have someone take you home after the procedure. What happens during the procedure?  To reduce your risk of infection,your health care team will wash or sanitize their hands.  You will be given one or  more of the following:  A medicine to help you relax (sedative).  A medicine to numb the area (local anesthetic).  The area around the opening of your urethra will be cleaned.  The cystoscope will be passed through your urethra into your bladder.  Germ-free (sterile)fluid will flow through the cystoscope to fill your bladder. The fluid will stretch your bladder so that your surgeon can clearly examine your bladder walls.  The cystoscope will be removed and your bladder will be emptied. The procedure may vary among health care providers and hospitals. What happens after the procedure?  You may have some soreness or pain in your abdomen and urethra. Medicines will be available to help you.  You may have some blood in your urine.  Do not drive for 24 hours if you received a sedative. This information is not intended to replace advice given to you by your health care provider. Make sure you discuss any questions you have with your health care provider. Document Released: 03/04/2000 Document Revised: 07/16/2015 Document Reviewed: 01/22/2015 Elsevier Interactive Patient Education  2017 [employer]  Transrectal Ultrasound-Guided Biopsy A transrectal ultrasound-guided biopsy is a procedure to take samples of tissue from your prostate. Ultrasound images are used to guide the procedure. It is usually done to check the prostate gland for cancer. What happens before the procedure?  Do not eat or drink after midnight on the night before your procedure.  Take medicines as your doctor tells you.  Your doctor may have you stop taking some medicines 5-7 days before the procedure.  You will be given an enema before your procedure. During an enema, a liquid is put into your butt (rectum) to clear out waste.  You may have lab tests the day of your procedure.  Make plans to have someone drive you home. What happens during the procedure?  You will be given medicine to help you relax before  the procedure. An IV tube will be put into one of your veins. It will be used to give fluids and medicine.  You will be given medicine to reduce the risk of infection (antibiotic).  You will be placed on your side.  A probe with gel will be put in your butt. This is used to take pictures of your prostate and the area around it.  A medicine to numb the area is put into your prostate.  A biopsy needle is then inserted and guided to your prostate.  Samples of prostate tissue are taken. The needle is removed.  The samples are sent to a lab to be checked. Results are usually back in 2-3 days. What happens after the procedure?  You will  be taken to a room where you will be watched until you are doing okay.  You may have some pain in the area around your butt. You will be given medicines for this.  You may be able to go home the same day. Sometimes, an overnight stay in the hospital is needed. This information is not intended to replace advice given to you by your health care provider. Make sure you discuss any questions you have with your health care provider. Document Released: 02/23/2009 Document Revised: 08/13/2015 Document Reviewed: 10/24/2012 Elsevier Interactive Patient Education  2017 [employer].

## 2016-05-14 LAB — URINE CULTURE: Culture: NO GROWTH

## 2016-05-14 LAB — BUN: BUN: 15 mg/dL (ref 6–24)

## 2016-05-14 LAB — CREATININE, SERUM
Creatinine, Ser: 1.3 mg/dL — ABNORMAL HIGH (ref 0.76–1.27)
GFR calc Af Amer: 69 mL/min/{1.73_m2} (ref >59)
GFR, EST NON AFRICAN AMERICAN: 60 mL/min/{1.73_m2} (ref >59)

## 2016-06-06 ENCOUNTER — Ambulatory Visit
Admission: RE | Admit: 2016-06-06 | Discharge: 2016-06-06 | Disposition: A | Discharge status: Home / Self Care | Payer: 59 | Source: Ambulatory Visit | Attending: Urology | Admitting: Urology

## 2016-06-06 DIAGNOSIS — I2510 Atherosclerotic heart disease of native coronary artery without angina pectoris: Secondary | ICD-10-CM | POA: Diagnosis not present

## 2016-06-06 DIAGNOSIS — R310 Gross hematuria: Secondary | ICD-10-CM

## 2016-06-06 DIAGNOSIS — K760 Fatty (change of) liver, not elsewhere classified: Secondary | ICD-10-CM | POA: Diagnosis not present

## 2016-06-06 DIAGNOSIS — N200 Calculus of kidney: Secondary | ICD-10-CM | POA: Diagnosis not present

## 2016-06-06 DIAGNOSIS — I700 Atherosclerosis of aorta: Secondary | ICD-10-CM | POA: Diagnosis not present

## 2016-06-06 DIAGNOSIS — N400 Enlarged prostate without lower urinary tract symptoms: Secondary | ICD-10-CM | POA: Diagnosis not present

## 2016-06-06 DIAGNOSIS — K449 Diaphragmatic hernia without obstruction or gangrene: Secondary | ICD-10-CM | POA: Insufficient documentation

## 2016-06-06 MED ORDER — IOPAMIDOL (ISOVUE-300) INJECTION 61%
150.0000 mL | Freq: Once | INTRAVENOUS | Status: AC | PRN
  Administered 2016-06-06: 125 mL via INTRAVENOUS

## 2016-06-08 ENCOUNTER — Other Ambulatory Visit: Payer: Self-pay

## 2016-06-08 DIAGNOSIS — R310 Gross hematuria: Secondary | ICD-10-CM

## 2016-06-10 ENCOUNTER — Ambulatory Visit: Payer: 59 | Admitting: Urology

## 2016-06-28 ENCOUNTER — Ambulatory Visit (INDEPENDENT_AMBULATORY_CARE_PROVIDER_SITE_OTHER): Payer: 59 | Admitting: Family Medicine

## 2016-06-28 ENCOUNTER — Encounter: Payer: Self-pay | Admitting: Family Medicine

## 2016-06-28 ENCOUNTER — Telehealth: Payer: Self-pay | Admitting: *Deleted

## 2016-06-28 VITALS — BP 104/58 | HR 96 | Temp 98.6°F | Ht 66.75 in | Wt 219.5 lb

## 2016-06-28 DIAGNOSIS — J209 Acute bronchitis, unspecified: Secondary | ICD-10-CM | POA: Diagnosis not present

## 2016-06-28 MED ORDER — GUAIFENESIN-CODEINE 100-10 MG/5ML PO SYRP: 5.0000 mL | ORAL_SOLUTION | Freq: Every evening | ORAL | 0 refills | Status: DC | PRN

## 2016-06-28 MED ORDER — SIMVASTATIN 40 MG PO TABS: 40.0000 mg | ORAL_TABLET | Freq: Every day | ORAL | 1 refills | Status: DC

## 2016-06-28 MED ORDER — ALBUTEROL SULFATE HFA 108 (90 BASE) MCG/ACT IN AERS: 2.0000 | INHALATION_SPRAY | Freq: Four times a day (QID) | RESPIRATORY_TRACT | 0 refills | Status: DC | PRN

## 2016-06-28 MED ORDER — AZITHROMYCIN 250 MG PO TABS: ORAL_TABLET | ORAL | 0 refills | Status: DC

## 2016-06-28 NOTE — Telephone Encounter (Signed)
Proair HFA prescriptions sent into [facility] in [place] as instructed by Dr. Diona Browner.

## 2016-06-28 NOTE — Telephone Encounter (Signed)
Okay to change to proair... I thought I just sent in generic.

## 2016-06-28 NOTE — Patient Instructions (Signed)
Complete course of antibiotics. Can use albuterol as needed for coughing fits. Use cough syrup at night.  Call if not improving as expected.

## 2016-06-28 NOTE — Progress Notes (Signed)
   Subjective:    Patient ID: Dennis Hicks, male    DOB: 1955-08-26, 61 y.o.   MRN: [id number]  Cough  This is a new problem. The current episode started 1 to 4 weeks ago (5 days). The problem has been waxing and waning. The cough is productive of sputum. Associated symptoms include a fever, nasal congestion and shortness of breath. Pertinent negatives include no ear congestion, ear pain, postnasal drip, rhinorrhea, sore throat or wheezing. Associated symptoms comments: Low grade fever Hoarse voice. The symptoms are aggravated by lying down (worse later in the day). Risk factors: nonsmoker. He has tried OTC cough suppressant (mucinex DM... using amoxicillin 500 mg  3 times a day in last 3.5 days) for the symptoms. The treatment provided mild relief. His past medical history is significant for pneumonia. There is no history of asthma, bronchiectasis, bronchitis, COPD or environmental allergies. diabetes  Fever   Associated symptoms include coughing. Pertinent negatives include no ear pain, sore throat or wheezing.     Review of Systems  Constitutional: Positive for fever.  HENT: Negative for ear pain, postnasal drip, rhinorrhea and sore throat.   Respiratory: Positive for cough and shortness of breath. Negative for wheezing.   Allergic/Immunologic: Negative for environmental allergies.       Objective:   Physical Exam  Constitutional: Vital signs are normal. He appears well-developed and well-nourished.  Non-toxic appearance. He does not appear ill. No distress.  HENT:  Head: Normocephalic and atraumatic.  Right Ear: Hearing, tympanic membrane, external ear and ear canal normal. No tenderness. No foreign bodies. Tympanic membrane is not retracted and not bulging.  Left Ear: Hearing, tympanic membrane, external ear and ear canal normal. No tenderness. No foreign bodies. Tympanic membrane is not retracted and not bulging.  Nose: Nose normal. No mucosal edema or rhinorrhea. Right sinus  exhibits no maxillary sinus tenderness and no frontal sinus tenderness. Left sinus exhibits no maxillary sinus tenderness and no frontal sinus tenderness.  Mouth/Throat: Uvula is midline, oropharynx is clear and moist and mucous membranes are normal. Normal dentition. No dental caries. No oropharyngeal exudate or tonsillar abscesses.  Eyes: Conjunctivae, EOM and lids are normal. Pupils are equal, round, and reactive to light. Lids are everted and swept, no foreign bodies found.  Neck: Trachea normal, normal range of motion and phonation normal. Neck supple. Carotid bruit is not present. No thyroid mass and no thyromegaly present.  Cardiovascular: Normal rate, regular rhythm, S1 normal, S2 normal, normal heart sounds, intact distal pulses and normal pulses.  Exam reveals no gallop.   No murmur heard. Pulmonary/Chest: Effort normal and breath sounds normal. No respiratory distress. He has no wheezes. He has no rhonchi. He has no rales.  constant coughing , cannot take deep breaths.  Abdominal: Soft. Normal appearance and bowel sounds are normal. There is no hepatosplenomegaly. There is no tenderness. There is no rebound, no guarding and no CVA tenderness. No hernia.  Neurological: He is alert. He has normal reflexes.  Skin: Skin is warm, dry and intact. No rash noted.  Psychiatric: He has a normal mood and affect. His speech is normal and behavior is normal. Judgment normal.          Assessment & Plan:

## 2016-06-28 NOTE — Assessment & Plan Note (Signed)
Partially treated with amox. Complete course of Z-pack. Use albuterol given coughing fits, cough suppressant at night.

## 2016-06-28 NOTE — Progress Notes (Signed)
Pre visit review using our clinic review tool, if applicable. No additional management support is needed unless otherwise documented below in the visit note. 

## 2016-06-28 NOTE — Telephone Encounter (Signed)
Received fax from [facility] stating Proventil is not covered by patient's insurance.  Preferred alternatives are Proair HFA, Proair Respiclick & Ventolin HFA.  Please change Rx to one of the preferred medications.

## 2016-07-06 ENCOUNTER — Other Ambulatory Visit: Payer: 59 | Admitting: Urology

## 2016-07-11 ENCOUNTER — Ambulatory Visit (INDEPENDENT_AMBULATORY_CARE_PROVIDER_SITE_OTHER): Payer: 59 | Admitting: Urology

## 2016-07-11 DIAGNOSIS — R310 Gross hematuria: Secondary | ICD-10-CM

## 2016-07-11 DIAGNOSIS — R972 Elevated prostate specific antigen [PSA]: Secondary | ICD-10-CM

## 2016-07-11 LAB — URINALYSIS, COMPLETE
Bilirubin, UA: NEGATIVE
Glucose, UA: NEGATIVE
Ketones, UA: NEGATIVE
LEUKOCYTES UA: NEGATIVE
NITRITE UA: NEGATIVE
PH UA: 7 (ref 5.0–7.5)
Protein, UA: NEGATIVE
Specific Gravity, UA: 1.015 (ref 1.005–1.030)
Urobilinogen, Ur: 0.2 mg/dL (ref 0.2–1.0)

## 2016-07-11 LAB — MICROSCOPIC EXAMINATION
BACTERIA UA: NONE SEEN
Epithelial Cells (non renal): NONE SEEN /hpf (ref 0–10)

## 2016-07-11 MED ORDER — CIPROFLOXACIN HCL 500 MG PO TABS: 500.0000 mg | ORAL_TABLET | Freq: Once | ORAL | Status: DC

## 2016-07-11 MED ORDER — DIAZEPAM 10 MG PO TABS: 10.0000 mg | ORAL_TABLET | Freq: Once | ORAL | 0 refills | Status: AC

## 2016-07-11 MED ORDER — LIDOCAINE HCL 2 % EX GEL: 1.0000 "application " | Freq: Once | CUTANEOUS | Status: DC

## 2016-07-11 NOTE — Progress Notes (Signed)
   07/11/16  CC: No chief complaint on file.   HPI: 1) gross hematuria - seen Feb 2018 for gross hematuria on two occassions.  He attributes this occurrence to taking large amounts of ASA to stave off a migraine.  He has not had episodes of gross hematuria since that time.  Patient doesn't have a prior history of microscopic hematuria.  He denied having symptoms of frequent urination, urgency, dysuria, nocturia, incontinence, hesitancy, intermittency, straining to urinate or a weak urinary stream.   She is not experiencing any suprapubic pain, abdominal pain or flank pain. Non-smoker. CT was normal Mar 2018.    2) BPH - prostate ~40 g on CT Mar 2018. IPSS score was 3, which is mild lower urinary tract symptomatology.  He is pleased with his quality life due to his urinary symptoms.  He denies any dysuria, hematuria or suprapubic pain.   3) Elevated PSA - His has had a prostate biopsy in the past with Dr. Olena Heckle and it was negative.  He states his PSA was around 5 at that time. His PSA rose to 5.34 Sep 2016 and 6.18 Dec 2015 and PSA 7.24 Apr 2016. Normal DRE Mar 2018.   Patient returns today for cysto and to discuss PSA elevation.    There were no vitals taken for this visit. NED. A&Ox3.   No respiratory distress   Abd soft, NT, ND Normal phallus with bilateral descended testicles  Cystoscopy Procedure Note  Patient identification was confirmed, informed consent was obtained, and patient was prepped using Betadine solution.  Lidocaine jelly was administered per urethral meatus.    Preoperative abx where received prior to procedure.     Pre-Procedure: - Inspection reveals a normal caliber ureteral meatus.  Procedure: The flexible cystoscope was introduced without difficulty - No urethral strictures/lesions are present. - Prostate - mild enlargement - Slightly elevated bladder neck - Bilateral ureteral orifices identified - Bladder mucosa  reveals no ulcers, tumors, or lesions -  No bladder stones - No trabeculation  Retroflexion shows normal bladder neck, minimal prostate protrusion    Post-Procedure: - Patient tolerated the procedure well  Assessment/ Plan: 1) gross hematuria - benign eval   2) PSA elevation - I discussed with patient the nature of PSA elevation and the nature, r/b of repeat bx vs prostate MRI. All questions answered. He elects to proceed with prostate MRI.

## 2016-07-13 ENCOUNTER — Other Ambulatory Visit: Payer: 59

## 2016-07-19 ENCOUNTER — Other Ambulatory Visit: Payer: Self-pay | Admitting: Family Medicine

## 2016-07-20 ENCOUNTER — Ambulatory Visit: Payer: 59 | Admitting: Urology

## 2016-07-26 ENCOUNTER — Ambulatory Visit: Payer: 59

## 2016-07-27 ENCOUNTER — Ambulatory Visit: Payer: 59 | Admitting: Urology

## 2016-08-22 ENCOUNTER — Ambulatory Visit (HOSPITAL_COMMUNITY)
Admission: RE | Admit: 2016-08-22 | Discharge: 2016-08-22 | Disposition: A | Discharge status: Home / Self Care | Payer: 59 | Source: Ambulatory Visit | Attending: Urology | Admitting: Urology

## 2016-08-22 DIAGNOSIS — R972 Elevated prostate specific antigen [PSA]: Secondary | ICD-10-CM

## 2016-08-22 LAB — POCT I-STAT CREATININE: Creatinine, Ser: 1.3 mg/dL — ABNORMAL HIGH (ref 0.61–1.24)

## 2016-08-22 MED ORDER — LIDOCAINE HCL 2 % EX GEL: 1.0000 "application " | Freq: Once | CUTANEOUS | Status: DC

## 2016-08-22 MED ORDER — LIDOCAINE HCL 2 % EX GEL
CUTANEOUS | Status: AC
  Filled 2016-08-22: qty 30

## 2016-08-22 MED ORDER — GADOBENATE DIMEGLUMINE 529 MG/ML IV SOLN
20.0000 mL | Freq: Once | INTRAVENOUS | Status: AC | PRN
  Administered 2016-08-22: 20 mL via INTRAVENOUS

## 2016-08-25 ENCOUNTER — Telehealth: Payer: Self-pay

## 2016-08-25 NOTE — Telephone Encounter (Signed)
-----   Message from Festus Aloe, MD sent at 08/24/2016  1:10 PM EDT ----- Notify patient his prostate MRI showed a couple of small suspicious areas for prostate cancer but no sign of any type of prostate cancer spread. I do recommend we repeat prostate biopsy, so she is willing set him up for a biopsy and go over instructions. I called and left message for him, too.   ----- Message ----- From: Leia Alf, CMA Sent: 08/22/2016   3:53 PM To: Festus Aloe, MD    ----- Message ----- From: Interface, Rad Results In Sent: 08/22/2016   2:27 PM To: Rowe Robert [facility]

## 2016-08-25 NOTE — Telephone Encounter (Signed)
Left pt mess to call to go over prostate biopsy instructions. Please schedule for biopsy and follow up thanks

## 2016-08-30 NOTE — Telephone Encounter (Signed)
Left message for patient to call back to confirm app and go over instructions.  Will mail them once he calls back.  Sharyn Lull

## 2016-09-01 NOTE — Telephone Encounter (Signed)
Left another message to call the office  [facility]

## 2016-09-12 ENCOUNTER — Telehealth: Payer: Self-pay | Admitting: Urology

## 2016-09-12 NOTE — Telephone Encounter (Signed)
Dennis Hicks, He never called back to schedule this app   Sharyn Lull

## 2016-09-12 NOTE — Telephone Encounter (Signed)
Left pt mess to call/SW 

## 2016-09-22 NOTE — Telephone Encounter (Signed)
Pt called to reschedule prostate biopsy. Appt made. Instructions given over phone & also mailed to pt.

## 2016-09-26 ENCOUNTER — Other Ambulatory Visit: Payer: 59

## 2016-10-03 ENCOUNTER — Ambulatory Visit: Payer: 59

## 2016-10-28 ENCOUNTER — Encounter: Payer: Self-pay | Admitting: Urology

## 2016-10-28 ENCOUNTER — Encounter: Payer: Self-pay | Admitting: Family Medicine

## 2016-10-28 ENCOUNTER — Ambulatory Visit (INDEPENDENT_AMBULATORY_CARE_PROVIDER_SITE_OTHER): Payer: 59 | Admitting: Urology

## 2016-10-28 ENCOUNTER — Other Ambulatory Visit: Payer: Self-pay | Admitting: Urology

## 2016-10-28 ENCOUNTER — Other Ambulatory Visit: Payer: Self-pay

## 2016-10-28 VITALS — BP 111/69 | HR 91 | Ht 66.75 in | Wt 216.9 lb

## 2016-10-28 DIAGNOSIS — Z79891 Long term (current) use of opiate analgesic: Secondary | ICD-10-CM | POA: Diagnosis not present

## 2016-10-28 DIAGNOSIS — C61 Malignant neoplasm of prostate: Secondary | ICD-10-CM | POA: Diagnosis not present

## 2016-10-28 DIAGNOSIS — R972 Elevated prostate specific antigen [PSA]: Secondary | ICD-10-CM

## 2016-10-28 LAB — BLADDER SCAN AMB NON-IMAGING: Scan Result: 32

## 2016-10-28 MED ORDER — LEVOFLOXACIN 500 MG PO TABS
500.0000 mg | ORAL_TABLET | Freq: Once | ORAL | Status: AC
  Administered 2016-10-28: 500 mg via ORAL

## 2016-10-28 MED ORDER — HYDROCODONE-ACETAMINOPHEN 5-325 MG PO TABS: ORAL_TABLET | ORAL | 0 refills | Status: DC

## 2016-10-28 MED ORDER — GENTAMICIN SULFATE 40 MG/ML IJ SOLN
80.0000 mg | Freq: Once | INTRAMUSCULAR | Status: AC
  Administered 2016-10-28: 80 mg via INTRAMUSCULAR

## 2016-10-28 NOTE — Progress Notes (Signed)
HPI: 1) Elevated PSA - His has had a prostate biopsy in the past with Dr. Olena Heckle and it was negative. He states his PSA was around 5 at that time. His PSA rose to 5.34 Sep 2016 and 6.18 Dec 2015 and PSA 7.24 Apr 2016. Normal DRE Mar 2018. F/u MRI with a PI-RADS 4 lesion left mid 10 mm, PI-RADS 4 lesion right anterior 11 mm. ECE and staging negative. 42 gram prostate.   2) gross hematuria - seen Feb 2018 for gross hematuria on two occassions. He attributes this occurrence to taking large amounts of ASA to stave off a migraine. He has not had episodes of gross hematuria since that time. Patient doesn't have a prior history of microscopic hematuria. He denied having symptoms of frequent urination, urgency, dysuria, nocturia, incontinence, hesitancy, intermittency, straining to urinate or a weak urinary stream. She is not experiencing any suprapubic pain, abdominal pain or flank pain. Non-smoker. CT was normal Mar 2018. Cystoscopy was normal Jul 11, 2016.   3) BPH - prostate ~40 g on CT Mar 2018. IPSS score was 3, which is mildlower urinary tract symptomatology. He is pleasedwith his quality life due to his urinary symptoms. He denies any dysuria, hematuria or suprapubic pain.   I reviewed the MRI images. Patient presents for cognitive fusion biopsied today. He's been well.   Prostate Biopsy Procedure   Informed consent was obtained after discussing risks/benefits of the procedure.  A time out was performed to ensure correct patient identity.  Pre-Procedure: - Last PSA Level:  Lab Results  Component Value Date   PSA 6.29 (H) 11/26/2015   PSA 5.34 (H) 03/10/2015   PSA 3.76 03/03/2014   - Gentamicin given prophylactically - Levaquin 500 mg administered PO -Transrectal Ultrasound performed revealing a 42.99 gm prostate -DRE normal  -No significant hypoechoic or median lobe noted  Procedure: - Prostate block performed using 10 cc 1% lidocaine and biopsies taken from sextant areas, a  total of 12 under ultrasound guidance. I obtained anterior samples on the right base and mid biopsies. I obtain lateral samples on the left base and mid biopsies.  Post-Procedure: - Patient tolerated the procedure well - He was counseled to seek immediate medical attention if experiences any severe pain, significant bleeding, or fevers - Return in one week to discuss biopsy results  Addendum: Patient returned this afternoon. He said pelvic pressure and noticed some gross hematuria when he voided. He can't sit down. He called and tried to get hydrocodone from his PCP. He voided with a good flow. PVR normal. Eating and drinking OK. No fever or chills. PE: Patient is in no acute distress. Abdomen is soft and nontender. Suprapubic area nontender. I stood him up and did not palpate an inguinal hernia. Penis and urethra palpably normal. On digital rectal exam the prostate was palpably normal. I felt no clots in the rectal vault nor did I palpate a hematoma. Prostate was nontender. Examining the gloves finger revealed no blood.  Post-bx pelvic pressure - may be pain from the biopsy or a bladder spasm. We discussed if he had any fever or uncontrolled pain to go to the emergency department for labs and to consider CT scan of pelvis.

## 2016-10-28 NOTE — Telephone Encounter (Signed)
[facility] notified as instructed by telephone.  Prescription is ready to be picked up at the front desk.

## 2016-10-28 NOTE — Telephone Encounter (Signed)
Pain Contract signed [facility]  Last UDS 2016... NEEDS TO COME IN TO REPEAT NCCSR reviewed on 10/28/2016, no red flags Last OV pain meds discussed: 10/2015  used for chronic back pain  He needs to make appt for CPX when able.  Will refill pain med.. But when picks up needs yearly UDS. If pain in prostate continuing to be severe or urinary issues.Marland Kitchen He needs to notify Urologist as this is unusual.

## 2016-10-28 NOTE — Telephone Encounter (Signed)
Pt left v/m requesting rx hydrocodone apap. Call when ready for pick up. No more available appts at [facility] today; pt has periodical back pain but this AM pt had prostate biopsy and pt having a lot of pain. Pt is willing to come in for appt but no appts available today. Pt has not contacted doctor that did biopsy yet. Last filled # 30 on 11/13/15; last annual exam 12/04/15 and last acute 06/28/16. Pt request cb.

## 2016-10-29 LAB — PSA: PROSTATE SPECIFIC AG, SERUM: 6.1 ng/mL — AB (ref 0.0–4.0)

## 2016-10-31 ENCOUNTER — Telehealth: Payer: Self-pay

## 2016-10-31 ENCOUNTER — Ambulatory Visit (INDEPENDENT_AMBULATORY_CARE_PROVIDER_SITE_OTHER): Payer: 59

## 2016-10-31 VITALS — BP 118/68 | HR 85 | Ht 67.0 in | Wt 214.8 lb

## 2016-10-31 DIAGNOSIS — R972 Elevated prostate specific antigen [PSA]: Secondary | ICD-10-CM

## 2016-10-31 LAB — BLADDER SCAN AMB NON-IMAGING: Scan Result: 115

## 2016-10-31 NOTE — Progress Notes (Signed)
Pt presented today for a bladder scan post prostate bx. PVR: 115. Offered pt to have STAT CT per Dr. Junious Silk dictation. Pt refused. Pt stated that he wanted to wait and see how he feels tomorrow. Pt denied n/c, f/c, dysuria.  Blood pressure 118/68, pulse 85, height 5\' 7"  (1.702 m), weight 214 lb 12.8 oz (97.4 kg).

## 2016-10-31 NOTE — Telephone Encounter (Signed)
Pt called stating he is still in a lot of discomfort from prostate bx on Friday. Pt stated that hes not as uncomfortable as he was Friday but its still 7/10 pain scale. Pt stated that he used another enema Saturday and Sunday to help relieve some of the pressure. Offered pt a nurse visit to come in for a PVR. Originally pt declined. After speaking with [name] Hummingbird, who worked with pt Friday, pt was supposed to seek tx at the ER for CT scan if pain was not any better. Reinforced with pt Dr. Junious Silk dictation. Pt finally agreed to come in for a PVR. Pt was added to nurse schedule.

## 2016-11-01 ENCOUNTER — Telehealth: Payer: Self-pay

## 2016-11-01 NOTE — Telephone Encounter (Signed)
Called to f/u with pt from prostate bx discomfort. Pt stated that he once again had a BM this morning and is feeling much better, but still rather sore. Pt declined CT at this time. Reinforced with pt to continue to take stool softner and drink plenty of fluids. Reinforced with pt if he has problems while out of town to call back. Pt voiced understanding of whole conversation.

## 2016-11-04 ENCOUNTER — Telehealth: Payer: Self-pay

## 2016-11-04 LAB — PATHOLOGY REPORT

## 2016-11-04 NOTE — Telephone Encounter (Signed)
-----   Message from Festus Aloe, MD sent at 11/01/2016  5:52 PM EDT ----- Notify patient we are still waiting on biopsy results. His repeat PSA remained elevated but was lower. Give result. ----- Message ----- From: Royanne Foots, CMA Sent: 10/31/2016  10:10 AM To: Festus Aloe, MD    ----- Message ----- From: Lavone Neri Lab Results In Sent: 10/29/2016   5:41 AM To: Rowe Robert Clinical

## 2016-11-04 NOTE — Telephone Encounter (Signed)
Left pt mess to call 

## 2016-11-04 NOTE — Telephone Encounter (Signed)
Patient returned call and was notified of his PSA results.  Confirmed that patient has a follow up on Tuesday, 8/21 to receive his biopsy results.

## 2016-11-07 ENCOUNTER — Other Ambulatory Visit: Payer: Self-pay | Admitting: Urology

## 2016-11-08 ENCOUNTER — Encounter: Payer: Self-pay | Admitting: Urology

## 2016-11-08 ENCOUNTER — Ambulatory Visit (INDEPENDENT_AMBULATORY_CARE_PROVIDER_SITE_OTHER): Payer: 59 | Admitting: Urology

## 2016-11-08 VITALS — BP 106/66 | HR 92 | Ht 67.0 in | Wt 215.4 lb

## 2016-11-08 DIAGNOSIS — Z85820 Personal history of malignant melanoma of skin: Secondary | ICD-10-CM | POA: Diagnosis not present

## 2016-11-08 DIAGNOSIS — C61 Malignant neoplasm of prostate: Secondary | ICD-10-CM

## 2016-11-08 DIAGNOSIS — L570 Actinic keratosis: Secondary | ICD-10-CM | POA: Diagnosis not present

## 2016-11-08 DIAGNOSIS — L814 Other melanin hyperpigmentation: Secondary | ICD-10-CM | POA: Diagnosis not present

## 2016-11-08 DIAGNOSIS — Z1283 Encounter for screening for malignant neoplasm of skin: Secondary | ICD-10-CM | POA: Diagnosis not present

## 2016-11-08 DIAGNOSIS — L820 Inflamed seborrheic keratosis: Secondary | ICD-10-CM | POA: Diagnosis not present

## 2016-11-08 NOTE — Progress Notes (Signed)
11/08/2016 1:40 PM   Dennis Hicks 1956/01/28 [id number]  Referring provider: Jinny Sanders, MD [address] [place], [place] [address]  Chief Complaint  Patient presents with  . Follow-up    Biopsy results    HPI:  Early intermediate risk prostate cancer - August 2018 PSA 6.1 (as high as 7.4) 43 g prostate T1c  Gleason 3+3 = 6, right mid, 18% Gleason 3+4 = 7, right apex, 2% Staging Jun 2018: MRI with a PI-RADS 4 lesion left mid 10 mm, PI-RADS 4 lesion right anterior 11 mm. ECE and staging negative. 42 gram prostate.  [facility]: OC 53 %, ECE 46%, LN 2%, SV 2%; PFR 88% 3yr surgery   He takes Viagra on occasion but doesn't have to use it.         PMH: Past Medical History:  Diagnosis Date  . Borderline diabetes   . Heartburn   . HLD (hyperlipidemia)   . Hypertension   . Melanoma ([place])   . Psoriasis     Surgical History: History reviewed. No pertinent surgical history.  Home Medications:  Allergies as of 11/08/2016      Reactions   Chocolate Other (See Comments)   Migraine   Shrimp [shellfish Allergy] Other (See Comments)   Migraines      Medication List       Accurate as of 11/08/16  1:40 PM. Always use your most recent med list.          albuterol 108 (90 Base) MCG/ACT inhaler Commonly known as:  PROAIR HFA Inhale 2 puffs into the lungs every 6 (six) hours as needed for wheezing or shortness of breath.   benazepril-hydrochlorthiazide 20-25 MG tablet Commonly known as:  LOTENSIN HCT TAKE 1 TABLET DAILY   betamethasone dipropionate 0.05 % cream Commonly known as:  DIPROLENE Apply 1 application topically 2 (two) times daily.   famotidine 10 MG chewable tablet Commonly known as:  PEPCID AC Chew 10 mg by mouth at bedtime as needed.   HYDROcodone-acetaminophen 5-325 MG tablet Commonly known as:  NORCO/VICODIN Take one tablet every 6-8 hours as needed for pain.   simvastatin 40 MG tablet Commonly known as:  ZOCOR Take 1 tablet (40  mg total) by mouth daily.   SUMAtriptan 100 MG tablet Commonly known as:  IMITREX TAKE 1 TABLET BY MOUTH EVERY 2 HOURS AS NEEDED( MAX OF 2 TABS IN 24 HOURS)   VIAGRA 100 MG tablet Generic drug:  sildenafil TAKE 1 TABLET AS NEEDED FOR ERECTILE DYSFUNCTION       Allergies:  Allergies  Allergen Reactions  . Chocolate Other (See Comments)    Migraine  . Shrimp [Shellfish Allergy] Other (See Comments)    Migraines    Family History: Family History  Problem Relation Age of Onset  . Hypertension Mother   . Coronary artery disease Father   . Hypertension Father   . Hyperlipidemia Father   . Diabetes Father   . Prostate cancer Neg Hx   . Kidney cancer Neg Hx   . Bladder Cancer Neg Hx     Social History:  reports that he has never smoked. He has never used smokeless tobacco. He reports that he drinks alcohol. He reports that he does not use drugs.  ROS:  Physical Exam: There were no vitals taken for this visit.  Constitutional:  Alert and oriented, No acute distress. HEENT: [place] AT, moist mucus membranes.  Trachea midline, no masses. Cardiovascular: No clubbing, cyanosis, or edema. Respiratory: Normal respiratory effort, no increased work of breathing. GI: Abdomen is soft, nontender, nondistended, no abdominal masses Skin: No rashes, bruises or suspicious lesions. Neurologic: Grossly intact, no focal deficits, moving all 4 extremities. Psychiatric: Normal mood and affect.  Laboratory Data: Lab Results  Component Value Date   WBC 9.6 04/26/2016   HGB 16.5 04/26/2016   HCT 47.4 04/26/2016   MCV 87 04/26/2016   PLT 328 04/26/2016    Lab Results  Component Value Date   CREATININE 1.30 (H) 08/22/2016    Lab Results  Component Value Date   PSA 6.29 (H) 11/26/2015   PSA 5.34 (H) 03/10/2015   PSA 3.76 03/03/2014    No results found for: TESTOSTERONE  Lab Results  Component Value Date   HGBA1C 6.7  (H) 11/26/2015    Urinalysis    Component Value Date/Time   COLORURINE STRAW (A) 05/13/2016 0859   APPEARANCEUR Clear 07/11/2016 1503   LABSPEC 1.015 05/13/2016 0859   PHURINE 7.5 05/13/2016 0859   GLUCOSEU Negative 07/11/2016 1503   HGBUR NEGATIVE 05/13/2016 0859   BILIRUBINUR Negative 07/11/2016 1503   KETONESUR NEGATIVE 05/13/2016 0859   PROTEINUR Negative 07/11/2016 1503   PROTEINUR NEGATIVE 05/13/2016 0859   UROBILINOGEN 0.2 04/26/2016 1110   NITRITE Negative 07/11/2016 1503   NITRITE NEGATIVE 05/13/2016 0859   LEUKOCYTESUR Negative 07/11/2016 1503     Assessment & Plan:    Early intermediate risk prostate cancer-using his path report as a guideline I had a long discussion with the patient using the Living with Prostate Cancer booklet. We went over the anatomy and NVB. We went his stage, grade and prognosis and the relevant anatomy. We discussed the nature risks and benefits of active surveillance, radical prostatectomy, IMRT (+/- brachytherapy, +/- ADT). We discussed specifically how each treatment might affect the bowel, bladder and sexual function. We discussed how each treatment might effect salvage treatments. We discussed the role of other modalities in the treatment of prostate cancer including chemotherapy, HIFU and cryotherapy. He elects to surveillance. We discussed low risk of metastasis with surveillance and risk of PCa progressing from curable to incurable. He gets a PSA in a month or two with his yearly and I asked him to forward it. Also offered referral to Rad Onc or Dr. Erlene Quan to discuss treatment. I'll see him in 4  months with a PSA and we'll consider repeat MRI or bx   There are no diagnoses linked to this encounter.  No Follow-up on file.  Festus Aloe, [facility] [address] [place], [place] [address] [phone]

## 2016-11-22 ENCOUNTER — Other Ambulatory Visit: Payer: Self-pay | Admitting: Family Medicine

## 2016-11-22 NOTE — Telephone Encounter (Signed)
Last office visit 06/28/2016.  Last refilled 01/29/2016 for 30 g with no refills.  Ok to refill?

## 2016-11-23 MED ORDER — BETAMETHASONE DIPROPIONATE 0.05 % EX CREA: 1.0000 "application " | TOPICAL_CREAM | Freq: Two times a day (BID) | CUTANEOUS | 0 refills | Status: DC

## 2017-01-09 ENCOUNTER — Other Ambulatory Visit: Payer: Self-pay | Admitting: Family Medicine

## 2017-02-15 ENCOUNTER — Other Ambulatory Visit: Payer: Self-pay | Admitting: Family Medicine

## 2017-02-24 ENCOUNTER — Telehealth: Payer: Self-pay | Admitting: Family Medicine

## 2017-02-24 MED ORDER — SIMVASTATIN 40 MG PO TABS: 40.0000 mg | ORAL_TABLET | Freq: Every day | ORAL | 0 refills | Status: DC

## 2017-02-24 MED ORDER — BENAZEPRIL-HYDROCHLOROTHIAZIDE 20-25 MG PO TABS: 1.0000 | ORAL_TABLET | Freq: Every day | ORAL | 1 refills | Status: DC

## 2017-02-24 NOTE — Telephone Encounter (Signed)
Copied from [place] [id number]. Topic: Inquiry >> Feb 24, 2017  2:11 PM Pricilla Handler wrote: Reason for CRM: Patient called requesting arefill of the following medication: Simvastatin (ZOCOR) 40 MG tablet and Benazepril-hydrochlorthiazide (LOTENSIN HCT) 20-25 MG tablet. Patient's pharmacy is [facility] [place], [place] - [address] [phone] (Phone)  [phone] (Fax).

## 2017-03-01 ENCOUNTER — Other Ambulatory Visit: Payer: 59

## 2017-03-01 DIAGNOSIS — C61 Malignant neoplasm of prostate: Secondary | ICD-10-CM

## 2017-03-02 LAB — SPECIMEN STATUS REPORT

## 2017-03-02 LAB — PSA: Prostate Specific Ag, Serum: 6 ng/mL — ABNORMAL HIGH (ref 0.0–4.0)

## 2017-03-03 ENCOUNTER — Ambulatory Visit: Payer: 59 | Admitting: Family Medicine

## 2017-03-03 ENCOUNTER — Encounter: Payer: Self-pay | Admitting: Family Medicine

## 2017-03-03 DIAGNOSIS — M7711 Lateral epicondylitis, right elbow: Secondary | ICD-10-CM | POA: Diagnosis not present

## 2017-03-03 DIAGNOSIS — I10 Essential (primary) hypertension: Secondary | ICD-10-CM | POA: Diagnosis not present

## 2017-03-03 MED ORDER — DICLOFENAC SODIUM 1 % TD GEL: 2.0000 g | Freq: Four times a day (QID) | TRANSDERMAL | 0 refills | Status: DC

## 2017-03-03 MED ORDER — BENAZEPRIL-HYDROCHLOROTHIAZIDE 20-25 MG PO TABS: 1.0000 | ORAL_TABLET | Freq: Every day | ORAL | 0 refills | Status: DC

## 2017-03-03 MED ORDER — SIMVASTATIN 40 MG PO TABS: 40.0000 mg | ORAL_TABLET | Freq: Every day | ORAL | 0 refills | Status: DC

## 2017-03-03 NOTE — Progress Notes (Signed)
   Subjective:    Patient ID: Dennis Hicks, male    DOB: July 12, 1955, 61 y.o.   MRN: [id number]  HPI    61 year old male presents for medication refill and elbow pain  Hypertension: Good control.. Needs refill of ACEI/HCTZ BP Readings from Last 3 Encounters:  03/03/17 118/78  11/08/16 106/66  10/31/16 118/68  Using medication without problems or lightheadedness:  Chest pain with exertion: Edema: Short of breath: Average home BPs: Other issues:   Elbow pain , right elbow x 5 months. LAteral elbow, radiates down arm.  Pain increases with lfting and shaking hands.  Icy hot has not helped much.Marland Kitchen  occ keeping him up at night.  No swelling or redness.  Started after working on boat.. Repetitively rubbing boat. No fall.  Cannot use NSAIDs. Has used  hydrocodone for pain.Marland Kitchen Helped some.   Review of Systems  Constitutional: Negative for fatigue and fever.  HENT: Negative for ear pain.   Eyes: Negative for pain.  Respiratory: Negative for cough and shortness of breath.   Cardiovascular: Negative for chest pain, palpitations and leg swelling.  Gastrointestinal: Negative for abdominal pain.  Genitourinary: Negative for dysuria.  Musculoskeletal: Negative for arthralgias.  Neurological: Negative for syncope, light-headedness and headaches.  Psychiatric/Behavioral: Negative for dysphoric mood.       Objective:   Physical Exam  Constitutional: Vital signs are normal. He appears well-developed and well-nourished.  HENT:  Head: Normocephalic.  Right Ear: Hearing normal.  Left Ear: Hearing normal.  Nose: Nose normal.  Mouth/Throat: Oropharynx is clear and moist and mucous membranes are normal.  Neck: Trachea normal. Carotid bruit is not present. No thyroid mass and no thyromegaly present.  Cardiovascular: Normal rate, regular rhythm and normal pulses. Exam reveals no gallop, no distant heart sounds and no friction rub.  No murmur heard. No peripheral edema  Pulmonary/Chest:  Effort normal and breath sounds normal. No respiratory distress.  Musculoskeletal:       Right elbow: He exhibits decreased range of motion. He exhibits no swelling and no effusion. Tenderness found. Lateral epicondyle tenderness noted. No radial head, no medial epicondyle and no olecranon process tenderness noted.  Neurological: He has normal strength. No sensory deficit.  Skin: Skin is warm, dry and intact. No rash noted.  Psychiatric: He has a normal mood and affect. His speech is normal and behavior is normal. Thought content normal.          Assessment & Plan:

## 2017-03-03 NOTE — Assessment & Plan Note (Signed)
Treat with topical NSAID given oral contraindicated.  Ice, home PT.  If not improving refer for injection.

## 2017-03-03 NOTE — Patient Instructions (Addendum)
Ice elbow, start home PT, apply topical antiinflammatory for pain and inflammation.  Call for referral to sports medicine Dr. Lorelei Pont or to [facility] if not improving in 2 weeks.   Tennis Elbow Tennis elbow (lateral epicondylitis) is inflammation of the outer tendons of your forearm close to your elbow. Your tendons attach your muscles to your bones. The outer tendons of your forearm are used to extend your wrist, and they attach on the outside part of your elbow. Tennis elbow is often found in people who play tennis, but anyone may get the condition from repeatedly extending the wrist or turning the forearm. What are the causes? This condition is caused by repeatedly extending your wrist and using your hands. It can result from sports or work that requires repetitive forearm movements. Tennis elbow may also be caused by an injury. What increases the risk? You have a higher risk of developing tennis elbow if you play tennis or another racquet sport. You also have a higher risk if you frequently use your hands for work. This condition is also more likely to develop in:  Musicians.  Carpenters, painters, and plumbers.  Cooks.  Cashiers.  People who work in [employer].  Architect workers.  Butchers.  People who use computers.  What are the signs or symptoms? Symptoms of this condition include:  Pain and tenderness in your forearm and the outer part of your elbow. You may only feel the pain when you use your arm, or you may feel it even when you are not using your arm.  A burning feeling that runs from your elbow through your arm.  Weak grip in your hands.  How is this diagnosed? This condition may be diagnosed by medical history and physical exam. You may also have other tests, including:  X-rays.  MRI.  How is this treated? Your health care provider will recommend lifestyle adjustments, such as resting and icing your arm. Treatment may also include:  Medicines for  inflammation. This may include shots of cortisone if your pain continues.  Physical therapy. This may include massage or exercises.  An elbow brace.  Surgery may eventually be recommended if your pain does not go away with treatment. Follow these instructions at home: Activity  Rest your elbow and wrist as directed by your health care provider. Try to avoid any activities that caused the problem until your health care provider says that you can do them again.  If a physical therapist teaches you exercises, do all of them as directed.  If you lift an object, lift it with your palm facing upward. This lowers the stress on your elbow. Lifestyle  If your tennis elbow is caused by sports, check your equipment and make sure that: ? You are using it correctly. ? It is the best fit for you.  If your tennis elbow is caused by work, take breaks frequently, if you are able. Talk with your manager about how to best perform tasks in a way that is safe. ? If your tennis elbow is caused by computer use, talk with your manager about any changes that can be made to your work environment. General instructions  If directed, apply ice to the painful area: ? Put ice in a plastic bag. ? Place a towel between your skin and the bag. ? Leave the ice on for 20 minutes, 2-3 times per day.  Take medicines only as directed by your health care provider.  If you were given a brace, wear it as  directed by your health care provider.  Keep all follow-up visits as directed by your health care provider. This is important. Contact a health care provider if:  Your pain does not get better with treatment.  Your pain gets worse.  You have numbness or weakness in your forearm, hand, or fingers. This information is not intended to replace advice given to you by your health care provider. Make sure you discuss any questions you have with your health care provider. Document Released: 03/07/2005 Document Revised:  11/05/2015 Document Reviewed: 03/03/2014 Elsevier Interactive Patient Education  [employer].

## 2017-03-03 NOTE — Assessment & Plan Note (Signed)
Well controlled. Continue current medication. He will return for CPX with labs prior in 2-3 months.

## 2017-03-06 ENCOUNTER — Ambulatory Visit (INDEPENDENT_AMBULATORY_CARE_PROVIDER_SITE_OTHER): Payer: 59 | Admitting: Urology

## 2017-03-06 ENCOUNTER — Telehealth: Payer: Self-pay

## 2017-03-06 ENCOUNTER — Encounter: Payer: Self-pay | Admitting: Urology

## 2017-03-06 VITALS — BP 131/78 | HR 99 | Ht 67.0 in | Wt 215.0 lb

## 2017-03-06 DIAGNOSIS — C61 Malignant neoplasm of prostate: Secondary | ICD-10-CM

## 2017-03-06 NOTE — Progress Notes (Signed)
03/06/2017 3:22 PM   Dennis Hicks [name] 1955/06/23 [id number]  Referring provider: Jinny Sanders, MD [place],  [id number]  No chief complaint on file.   HPI:  F/u -   1) Prostate cancer -  Early intermediate risk prostate cancer - August 2018 PSA 6.1 (as high as 7.4) 43 g prostate T1c  Gleason 3+3 = 6, right mid, 18% Gleason 3+4 = 7, right apex, 2% Staging Jun 2018: MRI with a PI-RADS 4 lesion left mid 10 mm, PI-RADS 4 lesion right anterior 11 mm. ECE and staging negative. 42 gram prostate.  [facility]: OC 53 %, ECE 46%, LN 2%, SV 2%; PFR 88% 107yr surgery   He had a prior negative bx with Dr. Olena Heckle around 2012. He had a lot of pelvic pain and pressure after the biopsy Aug 2018.   Pt returns in continued management of prostate cancer.  His December 2018 PSA was 6.  This remained stable. He's noticed some post void dribble or leakage after he voided. No urgency.    PMH: Past Medical History:  Diagnosis Date  . Borderline diabetes   . Heartburn   . HLD (hyperlipidemia)   . Hypertension   . Melanoma ([place])   . Psoriasis     Surgical History: No past surgical history on file.  Home Medications:  Allergies as of 03/06/2017      Reactions   Chocolate Other (See Comments)   Migraine   Shrimp [shellfish Allergy] Other (See Comments)   Migraines      Medication List        Accurate as of 03/06/17  3:22 PM. Always use your most recent med list.          albuterol 108 (90 Base) MCG/ACT inhaler Commonly known as:  PROAIR HFA Inhale 2 puffs into the lungs every 6 (six) hours as needed for wheezing or shortness of breath.   benazepril-hydrochlorthiazide 20-25 MG tablet Commonly known as:  LOTENSIN HCT Take 1 tablet by mouth daily.   betamethasone dipropionate 0.05 % cream Commonly known as:  DIPROLENE Apply 1 application topically 2 (two) times daily.   diclofenac sodium 1 % Gel Commonly known as:  VOLTAREN Apply 2 g topically 4  (four) times daily.   famotidine 10 MG chewable tablet Commonly known as:  PEPCID AC Chew 10 mg by mouth at bedtime as needed.   HYDROcodone-acetaminophen 5-325 MG tablet Commonly known as:  NORCO/VICODIN Take one tablet every 6-8 hours as needed for pain.   simvastatin 40 MG tablet Commonly known as:  ZOCOR Take 1 tablet (40 mg total) by mouth daily.   SUMAtriptan 100 MG tablet Commonly known as:  IMITREX TAKE 1 TABLET BY MOUTH EVERY 2 HOURS AS NEEDED, MAX OF 2 TABLETS IN 24 HOURS   VIAGRA 100 MG tablet Generic drug:  sildenafil TAKE 1 TABLET AS NEEDED FOR ERECTILE DYSFUNCTION       Allergies:  Allergies  Allergen Reactions  . Chocolate Other (See Comments)    Migraine  . Shrimp [Shellfish Allergy] Other (See Comments)    Migraines    Family History: Family History  Problem Relation Age of Onset  . Hypertension Mother   . Coronary artery disease Father   . Hypertension Father   . Hyperlipidemia Father   . Diabetes Father   . Prostate cancer Neg Hx   . Kidney cancer Neg Hx   . Bladder Cancer Neg Hx     Social History:  reports  that  has never smoked. he has never used smokeless tobacco. He reports that he drinks alcohol. He reports that he does not use drugs.  ROS:                                        Physical Exam: There were no vitals taken for this visit.  Constitutional:  Alert and oriented, No acute distress. HEENT: [place] AT, moist mucus membranes.  Trachea midline, no masses. Cardiovascular: No clubbing, cyanosis, or edema. Respiratory: Normal respiratory effort, no increased work of breathing. GI: Abdomen is soft, nontender, nondistended, no abdominal masses Skin: No rashes, bruises or suspicious lesions. Neurologic: Grossly intact, no focal deficits, moving all 4 extremities. Psychiatric: Normal mood and affect.  Laboratory Data: Lab Results  Component Value Date   WBC 9.6 04/26/2016   HGB 16.5 04/26/2016   HCT 47.4  04/26/2016   MCV 87 04/26/2016   PLT 328 04/26/2016    Lab Results  Component Value Date   CREATININE 1.30 (H) 08/22/2016    Lab Results  Component Value Date   PSA1 6.0 (H) 03/01/2017   PSA1 6.1 (H) 10/28/2016   PSA1 7.4 (H) 04/26/2016    No results found for: TESTOSTERONE  Lab Results  Component Value Date   HGBA1C 6.7 (H) 11/26/2015    Urinalysis Lab Results  Component Value Date   SPECGRAV 1.015 07/11/2016   PHUR 7.0 07/11/2016   COLORU Yellow 07/11/2016   APPEARANCEUR Clear 07/11/2016   LEUKOCYTESUR Negative 07/11/2016   PROTEINUR Negative 07/11/2016   GLUCOSEU Negative 07/11/2016   KETONESU Negative 07/11/2016   RBCU Trace (A) 07/11/2016   BILIRUBINUR Negative 07/11/2016   UUROB 0.2 07/11/2016   NITRITE Negative 07/11/2016    Lab Results  Component Value Date   LABMICR See below: 07/11/2016   WBCUA 0-5 07/11/2016   RBCUA 0-2 07/11/2016   LABEPIT None seen 07/11/2016   BACTERIA None seen 07/11/2016    No results found for this or any previous visit. No results found for this or any previous visit. No results found for this or any previous visit. No results found for this or any previous visit. No results found for this or any previous visit. No results found for this or any previous visit. Results for orders placed during the hospital encounter of 06/06/16  CT HEMATURIA WORKUP   Narrative CLINICAL DATA:  Recent episode of gross hematuria. No current symptoms are reported. Chronic kidney disease. History of melanoma.  EXAM: CT ABDOMEN AND PELVIS WITHOUT AND WITH CONTRAST  TECHNIQUE: Multidetector CT imaging of the abdomen and pelvis was performed following the standard protocol before and following the bolus administration of intravenous contrast.  CONTRAST:  125 cc Isovue-300 IV.  COMPARISON:  None.  FINDINGS: Lower chest: No significant pulmonary nodules or acute consolidative airspace disease. Left circumflex coronary  atherosclerosis.  Hepatobiliary: Mild diffuse hepatic steatosis. Normal liver size. No liver surface irregularity. No liver mass Normal gallbladder with no radiopaque cholelithiasis. No biliary ductal dilatation.  Pancreas: Normal, with no mass or duct dilation.  Spleen: Normal size. No mass.  Adrenals/Urinary Tract: Normal adrenals. Nonobstructing 3 mm interpolar left renal stone. No additional renal stones. No hydronephrosis. Normal caliber ureters, with no ureteral stones. No right renal cortical lesions. Two subcentimeter hypodense renal cortical lesions in the lower left kidney are too small to characterize and require no further follow-up. On delayed imaging,  there is no urothelial wall thickening and there are no filling defects in the opacified portions of the bilateral collecting systems or ureters. No bladder stones, wall thickening, masses or diverticula.  Stomach/Bowel: Small hiatal hernia. Otherwise grossly normal stomach. Normal caliber small bowel with no small bowel wall thickening. Normal appendix. Normal large bowel with no diverticulosis, large bowel wall thickening or pericolonic fat stranding.  Vascular/Lymphatic: Atherosclerotic nonaneurysmal abdominal aorta. Patent portal, splenic, hepatic and renal veins. No pathologically enlarged lymph nodes in the abdomen or pelvis.  Reproductive: Prostate dimensions 4.3 x 3.9 x 4.6 cm (volume = 40 cm^3). Mildly enlarged prostate. Nonspecific internal prostatic calcifications.  Other: No pneumoperitoneum, ascites or focal fluid collection.  Musculoskeletal: No aggressive appearing focal osseous lesions. Moderate thoracolumbar spondylosis.  IMPRESSION: 1. Solitary nonobstructing 3 mm left renal stone. No hydronephrosis. No ureteral or bladder stones. 2. No suspicious renal cortical masses.  No urothelial lesions . 3. Mildly enlarged prostate. 4. Additional findings include aortic atherosclerosis,  coronary atherosclerosis, mild diffuse hepatic steatosis and small hiatal hernia.   Electronically Signed   By: Ilona Sorrel M.D.   On: 06/06/2016 16:34    No results found for this or any previous visit.  Assessment & Plan:    Prostate cancer-I discussed again with the patient the nature risks and benefits of continued surveillance, surgery or radiation for the prostate cancer.  We discussed androgen deprivation along with radiation.  I would recommend either a fusion biopsy or genetic testing with ProMark to better assess his risk of surveillance. We will pursue genetics. He said he regrets having a biopsy and hopes he doesn't have to have another one.   There are no diagnoses linked to this encounter.  No Follow-up on file.  Festus Aloe, MD  [facility] [address], [place], [place] [address] [phone]

## 2017-03-06 NOTE — Telephone Encounter (Signed)
Spoke with pt as he was walking out the door. Orders placed for [place] lab.

## 2017-03-06 NOTE — Telephone Encounter (Signed)
-----   Message from Festus Aloe, MD sent at 03/06/2017  4:20 PM EST ----- Let Mr. Mcandrew know ProMark genetic testing is not available on his biopsy as it's been over 60 days. However, he can get another MRI in the future. Have him see Dr. Erlene Quan (he prefers [place]) in about 3 months with a PSA prior.

## 2017-03-07 ENCOUNTER — Telehealth: Payer: Self-pay | Admitting: *Deleted

## 2017-03-07 NOTE — Telephone Encounter (Signed)
Received fax from [facility] requesting PA for Diclofenac Gel 1%.  PA forms completed and faxed back to [facility] at [phone].  Awaiting decision.

## 2017-03-08 MED ORDER — DICLOFENAC SODIUM 1.5 % TD SOLN: TRANSDERMAL | 0 refills | Status: DC

## 2017-03-08 NOTE — Addendum Note (Signed)
Addended by: Carter Kitten on: 03/08/2017 04:29 PM   Modules accepted: Orders

## 2017-03-08 NOTE — Telephone Encounter (Signed)
Rx for Diclofenac solution 1.5% sent into [facility]

## 2017-03-08 NOTE — Telephone Encounter (Signed)
Okay to try  Change to solution.

## 2017-03-08 NOTE — Telephone Encounter (Signed)
PA denied.  Looks like they will cover the Diclofenac topical  solution 1.5%.  Ok to check prescriptions to Solution vs. Gel?

## 2017-03-29 IMAGING — CT CT ABD-PEL WO/W CM
3 of 12 series · 12 of 46 positions shown, 18 images · IV contrast (isovue)
Comparison: None.

CLINICAL DATA: Recent episode of gross hematuria. No current
symptoms are reported. Chronic kidney disease. History of melanoma.

EXAM:
CT ABDOMEN AND PELVIS WITHOUT AND WITH CONTRAST
TECHNIQUE: Multidetector CT imaging of the abdomen and pelvis was performed
following the standard protocol before and following the bolus
administration of intravenous contrast.
CONTRAST:  125 cc Isovue-3QQ IV.

[Series 2: hematuria > 45 wo · axial · 0.80mm/px · z∈[-884,-644]mm · 4 of 98 slices shown]
[im 17/98  soft-tissue]
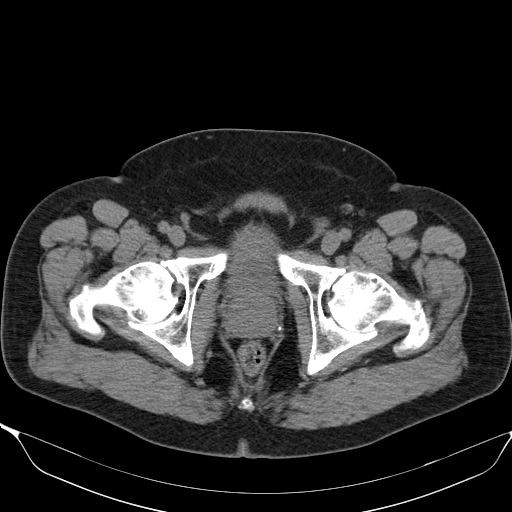
[im 33/98  soft-tissue]
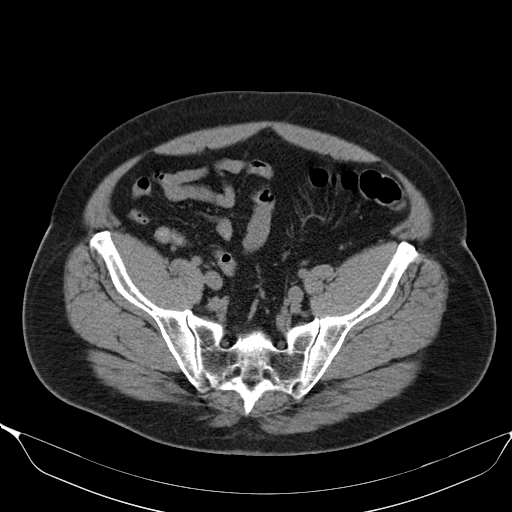
[im 49/98  soft-tissue]
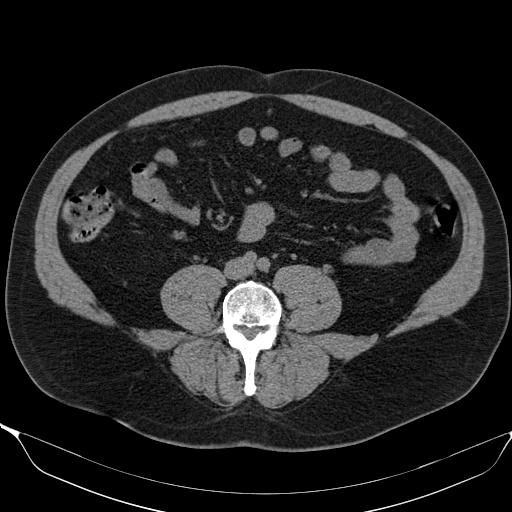
[im 65/98  soft-tissue]
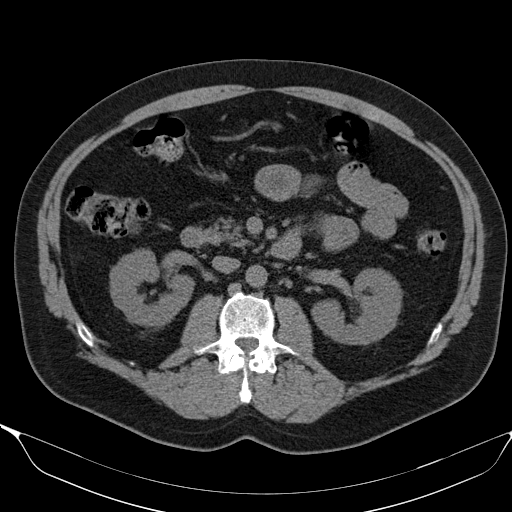

[Series 7: hematuria > 45 10 min delay · axial · delayed · 0.95mm/px · z∈[-873,-498]mm · 6 of 105 slices shown, 11 images]
[im 15/105  soft-tissue]
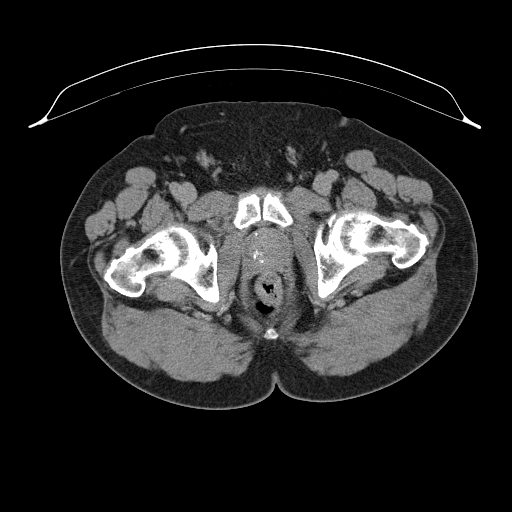
[im 15/105  bone]
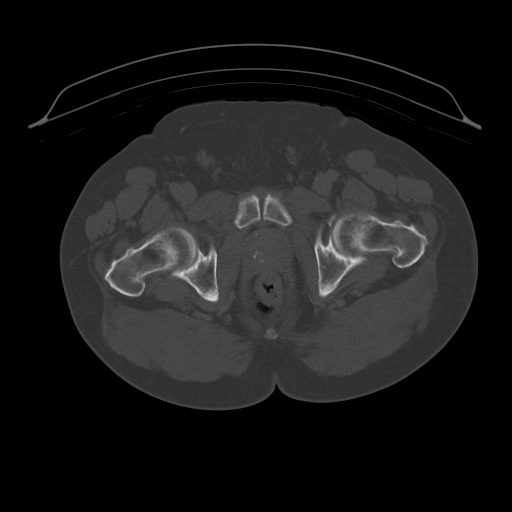
[im 30/105  soft-tissue]
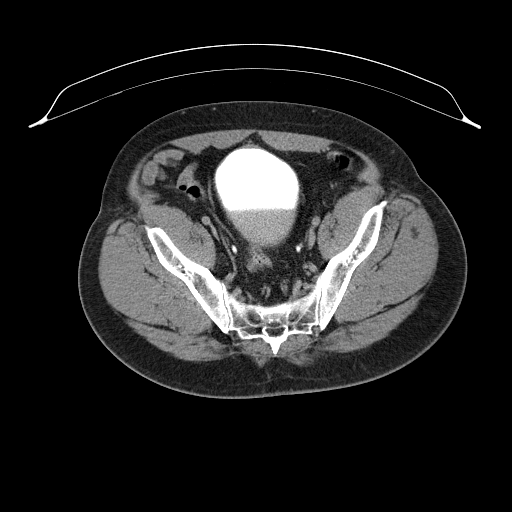
[im 45/105  soft-tissue]
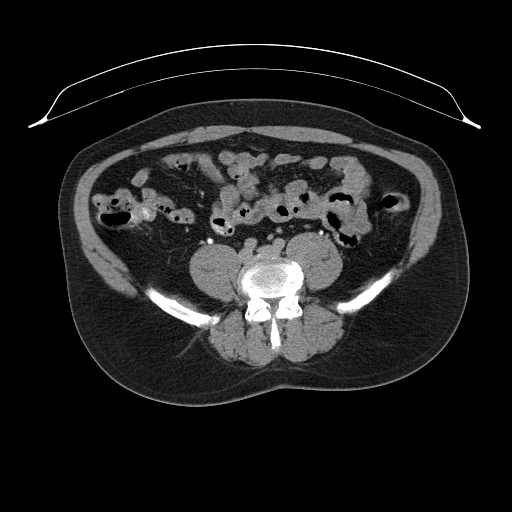
[im 45/105  lung]
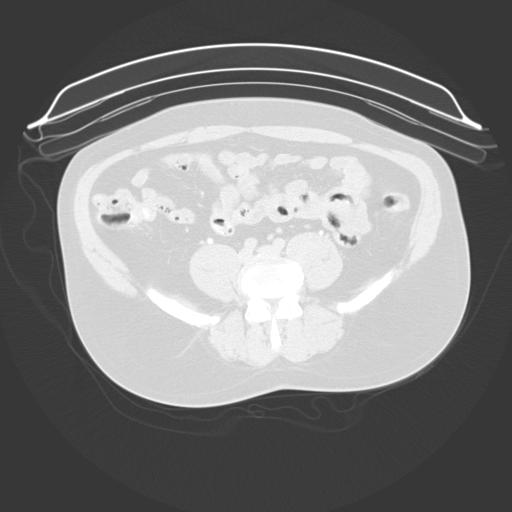
[im 60/105  soft-tissue]
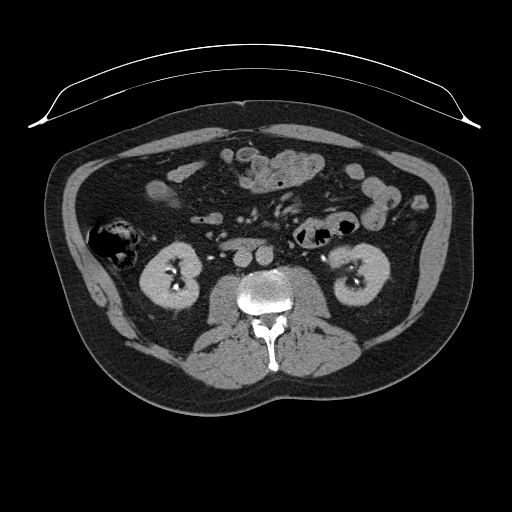
[im 60/105  lung]
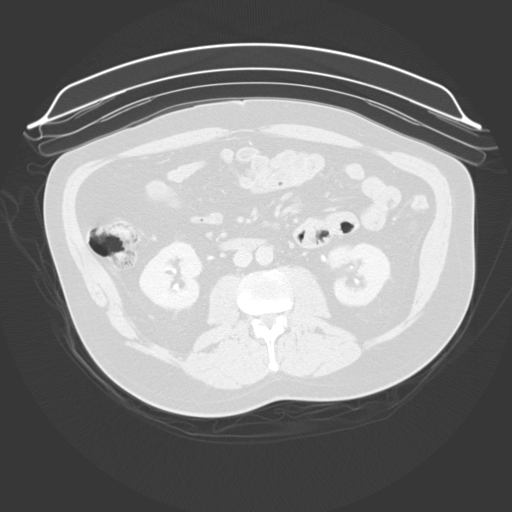
[im 75/105  soft-tissue]
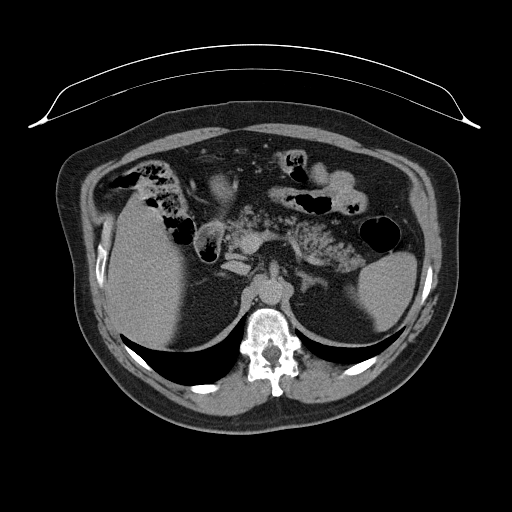
[im 75/105  lung]
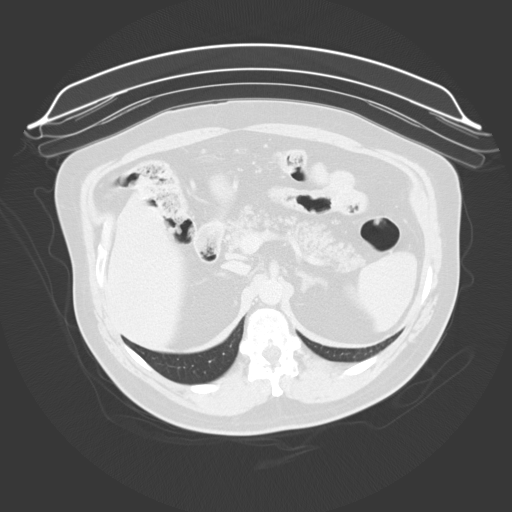
[im 90/105  soft-tissue]
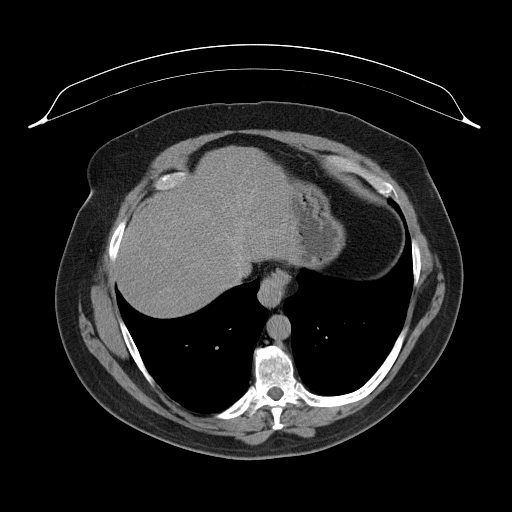
[im 90/105  lung]
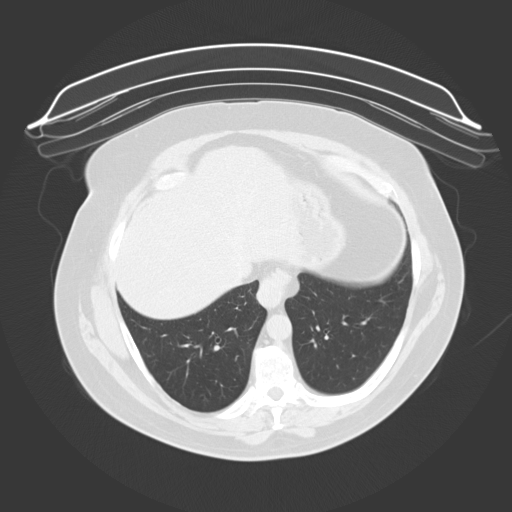

[Series 607: coronal delay · coronal · delayed · 1.02mm/px · 2 of 148 slices shown, 3 images]
[im 50/148  soft-tissue]
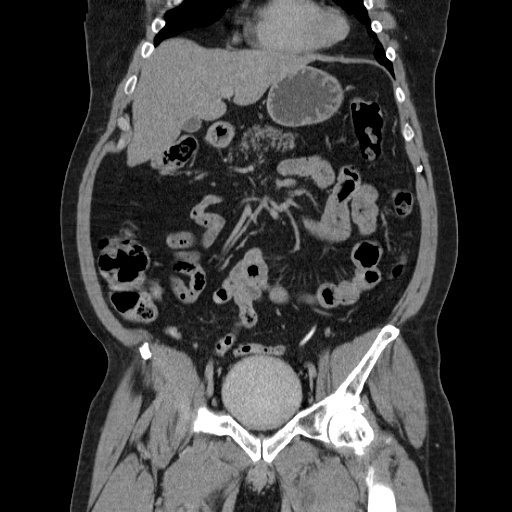
[im 50/148  bone]
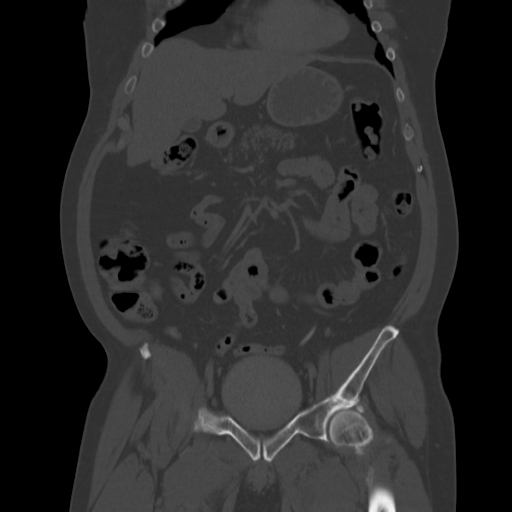
[im 99/148  soft-tissue]
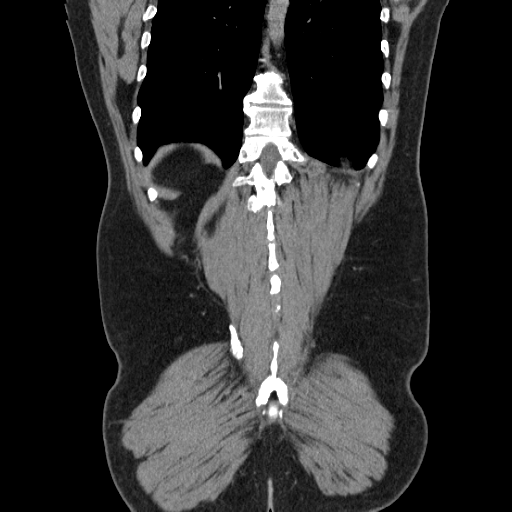

[12 of 46 positions shown; findings below may reference images not displayed]

FINDINGS: Lower chest: No significant pulmonary nodules or acute consolidative
airspace disease. Left circumflex coronary atherosclerosis.

Hepatobiliary: Mild diffuse hepatic steatosis. Normal liver size. No
liver surface irregularity. No liver mass Normal gallbladder with no
radiopaque cholelithiasis. No biliary ductal dilatation.

Pancreas: Normal, with no mass or duct dilation.

Spleen: Normal size. No mass.

Adrenals/Urinary Tract: Normal adrenals. Nonobstructing 3 mm
interpolar left renal stone. No additional renal stones. No
hydronephrosis. Normal caliber ureters, with no ureteral stones. No
right renal cortical lesions. Two subcentimeter hypodense renal
cortical lesions in the lower left kidney are too small to
characterize and require no further follow-up. On delayed imaging,
there is no urothelial wall thickening and there are no filling
defects in the opacified portions of the bilateral collecting
systems or ureters. No bladder stones, wall thickening, masses or
diverticula.

Stomach/Bowel: Small hiatal hernia. Otherwise grossly normal
stomach. Normal caliber small bowel with no small bowel wall
thickening. Normal appendix. Normal large bowel with no
diverticulosis, large bowel wall thickening or pericolonic fat
stranding.

Vascular/Lymphatic: Atherosclerotic nonaneurysmal abdominal aorta.
Patent portal, splenic, hepatic and renal veins. No pathologically
enlarged lymph nodes in the abdomen or pelvis.

Reproductive: Prostate dimensions 4.3 x 3.9 x 4.6 cm (volume = 40
cm^3). Mildly enlarged prostate. Nonspecific internal prostatic
calcifications.

Other: No pneumoperitoneum, ascites or focal fluid collection.

Musculoskeletal: No aggressive appearing focal osseous lesions.
Moderate thoracolumbar spondylosis.
IMPRESSION: 1. Solitary nonobstructing 3 mm left renal stone. No hydronephrosis.
No ureteral or bladder stones.
2. No suspicious renal cortical masses.  No urothelial lesions .
3. Mildly enlarged prostate.
4. Additional findings include aortic atherosclerosis, coronary
atherosclerosis, mild diffuse hepatic steatosis and small hiatal
hernia.

## 2017-05-07 ENCOUNTER — Encounter: Payer: Self-pay | Admitting: Family Medicine

## 2017-05-07 ENCOUNTER — Other Ambulatory Visit: Payer: Self-pay | Admitting: Family Medicine

## 2017-05-08 MED ORDER — BENAZEPRIL-HYDROCHLOROTHIAZIDE 20-25 MG PO TABS: 1.0000 | ORAL_TABLET | Freq: Every day | ORAL | 1 refills | Status: DC

## 2017-05-15 DIAGNOSIS — Z1283 Encounter for screening for malignant neoplasm of skin: Secondary | ICD-10-CM | POA: Diagnosis not present

## 2017-05-15 DIAGNOSIS — D229 Melanocytic nevi, unspecified: Secondary | ICD-10-CM | POA: Diagnosis not present

## 2017-05-15 DIAGNOSIS — Z85820 Personal history of malignant melanoma of skin: Secondary | ICD-10-CM | POA: Diagnosis not present

## 2017-05-24 ENCOUNTER — Encounter: Payer: Self-pay | Admitting: *Deleted

## 2017-05-24 ENCOUNTER — Other Ambulatory Visit: Payer: Self-pay | Admitting: Family Medicine

## 2017-05-24 NOTE — Telephone Encounter (Signed)
MyChart message and letter mailed to Dennis Hicks asking that he call the office to schedule an Annual Physical with fasting labs prior with Dr. Diona Browner prior to needing additional refills on his cholesterol medication.

## 2017-05-24 NOTE — Telephone Encounter (Signed)
Last office visit 03/03/2017 for epicondylitis.  Last Lipid 11/26/2015.  No future appointments. Refill?

## 2017-05-24 NOTE — Telephone Encounter (Signed)
Needs to make appt for CPX with labs prior to check chol. Okay to refill until then.

## 2017-06-02 ENCOUNTER — Ambulatory Visit: Payer: 59 | Admitting: Urology

## 2017-06-09 ENCOUNTER — Ambulatory Visit: Payer: 59 | Admitting: Urology

## 2017-06-23 ENCOUNTER — Encounter: Payer: Self-pay | Admitting: Urology

## 2017-06-23 ENCOUNTER — Ambulatory Visit (INDEPENDENT_AMBULATORY_CARE_PROVIDER_SITE_OTHER): Payer: 59 | Admitting: Urology

## 2017-06-23 ENCOUNTER — Other Ambulatory Visit
Admission: RE | Admit: 2017-06-23 | Discharge: 2017-06-23 | Disposition: A | Discharge status: Home / Self Care | Payer: 59 | Source: Ambulatory Visit | Attending: Urology | Admitting: Urology

## 2017-06-23 VITALS — BP 133/67 | HR 101 | Ht 67.0 in | Wt 215.0 lb

## 2017-06-23 DIAGNOSIS — C61 Malignant neoplasm of prostate: Secondary | ICD-10-CM | POA: Diagnosis not present

## 2017-06-23 NOTE — Progress Notes (Signed)
06/23/2017 9:29 AM   Dennis Hicks Dennis Hicks 09-08-1955 [id number]  Referring provider: Jinny Sanders, MD [address] [place], [place] [address]  Chief Complaint  Patient presents with  . Prostate Cancer    HPI: 62 year old male with a personal history of prostate cancer on active surveillance who presents today for routine follow-up.  He is previously followed by my partner, Dr. Eda Hicks.  Prostate cancer history as below.  He is due for repeat PSA today which is pending.  After lengthy discussion with Dr. Junious Hicks, he would like to pursue active surveillance despite his intermediate risk disease.  He is very hesitant to undergo any repeat biopsy in the future due to his degree of discomfort with the biopsy.  He denies any urinary issues, weight loss or bone pain.  ) Prostate cancer -   Early intermediate risk prostate cancer-August 2018 PSA 6.1 (as high as 7.4) 43 g prostate T1c Gleason 3+3 = 6, right mid, 18%  Gleason 3+4 = 7, right apex, 2% StagingJun 2018:MRI with a PI-RADS 4 lesion left mid 10 mm, PI-RADS 4 lesion right anterior 11 mm. ECE and staging negative. 42 gram prostate. [facility]: OC 53 %, ECE 46%, LN 2%, SV 2%; PFR 88% 78yr surgery   He had a prior negative bx with Dr. Olena Hicks around 2012. He had a lot of pelvic pain and pressure after the biopsy Aug 2018.    PMH: Past Medical History:  Diagnosis Date  . Borderline diabetes   . Heartburn   . HLD (hyperlipidemia)   . Hypertension   . Melanoma ([place])   . Psoriasis     Surgical History: No past surgical history on file.  Home Medications:  Allergies as of 06/23/2017      Reactions   Chocolate Other (See Comments)   Migraine   Shrimp [shellfish Allergy] Other (See Comments)   Migraines      Medication List        Accurate as of 06/23/17 11:59 PM. Always use your most recent med list.          albuterol 108 (90 Base) MCG/ACT inhaler Commonly known as:  PROAIR HFA Inhale 2 puffs  into the lungs every 6 (six) hours as needed for wheezing or shortness of breath.   benazepril-hydrochlorthiazide 20-25 MG tablet Commonly known as:  LOTENSIN HCT Take 1 tablet by mouth daily.   betamethasone dipropionate 0.05 % cream Commonly known as:  DIPROLENE Apply 1 application topically 2 (two) times daily.   Diclofenac Sodium 1.5 % Soln Apply to right elbow four times a day   famotidine 10 MG chewable tablet Commonly known as:  PEPCID AC Chew 10 mg by mouth at bedtime as needed.   HYDROcodone-acetaminophen 5-325 MG tablet Commonly known as:  NORCO/VICODIN Take one tablet every 6-8 hours as needed for pain.   simvastatin 40 MG tablet Commonly known as:  ZOCOR TAKE 1 TABLET DAILY   SUMAtriptan 100 MG tablet Commonly known as:  IMITREX TAKE 1 TABLET BY MOUTH EVERY 2 HOURS AS NEEDED, MAX OF 2 TABLETS IN 24 HOURS   VIAGRA 100 MG tablet Generic drug:  sildenafil TAKE 1 TABLET AS NEEDED FOR ERECTILE DYSFUNCTION       Allergies:  Allergies  Allergen Reactions  . Chocolate Other (See Comments)    Migraine  . Shrimp [Shellfish Allergy] Other (See Comments)    Migraines    Family History: Family History  Problem Relation Age of Onset  . Hypertension Mother   .  Coronary artery disease Father   . Hypertension Father   . Hyperlipidemia Father   . Diabetes Father   . Prostate cancer Neg Hx   . Kidney cancer Neg Hx   . Bladder Cancer Neg Hx     Social History:  reports that he has never smoked. He has never used smokeless tobacco. He reports that he drinks alcohol. He reports that he does not use drugs.  ROS: UROLOGY Frequent Urination?: No Hard to postpone urination?: No Burning/pain with urination?: No Get up at night to urinate?: No Leakage of urine?: No Urine stream starts and stops?: No Trouble starting stream?: No Do you have to strain to urinate?: No Blood in urine?: No Urinary tract infection?: No Sexually transmitted disease?: No Injury to  kidneys or bladder?: No Painful intercourse?: No Weak stream?: No Erection problems?: No Penile pain?: No  Gastrointestinal Nausea?: No Vomiting?: No Indigestion/heartburn?: No Diarrhea?: No Constipation?: No  Constitutional Fever: No Night sweats?: No Weight loss?: No Fatigue?: No  Skin Skin rash/lesions?: No Itching?: No  Eyes Blurred vision?: No Double vision?: No  Ears/Nose/Throat Sore throat?: No Sinus problems?: No  Hematologic/Lymphatic Swollen glands?: No Easy bruising?: No  Cardiovascular Leg swelling?: No Chest pain?: No  Respiratory Cough?: No Shortness of breath?: No  Endocrine Excessive thirst?: No  Musculoskeletal Back pain?: No Joint pain?: No  Neurological Headaches?: No Dizziness?: No  Psychologic Depression?: No Anxiety?: No  Physical Exam: BP 133/67   Pulse (!) 101   Ht 5\' 7"  (1.702 m)   Wt 215 lb (97.5 kg)   BMI 33.67 kg/m   Constitutional:  Alert and oriented, No acute distress. HEENT: [place] AT, moist mucus membranes.  Trachea midline, no masses. Cardiovascular: No clubbing, cyanosis, or edema. Respiratory: Normal respiratory effort, no increased work of breathing. Skin: No rashes, bruises or suspicious lesions. Neurologic: Grossly intact, no focal deficits, moving all 4 extremities. Psychiatric: Normal mood and affect.  Laboratory Data: Lab Results  Component Value Date   WBC 9.6 04/26/2016   HGB 16.5 04/26/2016   HCT 47.4 04/26/2016   MCV 87 04/26/2016   PLT 328 04/26/2016    Lab Results  Component Value Date   CREATININE 1.30 (H) 08/22/2016   PSA 03/01/2017  6.0 PSA 10/28/2016  6.1 PSA 04/26/2016  7.4  Lab Results  Component Value Date   PSA 6.29 (H) 11/26/2015   PSA 5.34 (H) 03/10/2015   PSA 3.76 03/03/2014    Lab Results  Component Value Date   HGBA1C 6.7 (H) 11/26/2015    Urinalysis N/a  Pertinent Imaging: No new interval imaging  Assessment & Plan:    1. Prostate cancer ([place]) Low  volume intermediate risk prostate cancer dx 10/2016 PSA pending today- will call with results and further recommendations We discussed risk/ benefits of surveillance of intermediate prostate cancer Previously considered genetics (promark) but sample unable to be processed due to timing   Strongly recommend repeat biopsy at 1 year from diagnosis, instructions reviewed again With strongly recommend risk stratification genetics at the time of next biopsy to assess whether or not active surveillance continues to be a reasonable option for this young patient - PSA; Future - PSA; Standing   Return in about 4 months (around 10/23/2017) for prostate biopsy.  Hollice Espy, MD  [facility] [address], [place], [place] [address] [phone]  I spent 15 min with this patient of which greater than 50% was spent in counseling and coordination of care with  the patient.

## 2017-06-24 LAB — PSA: Prostatic Specific Antigen: 6.41 ng/mL — ABNORMAL HIGH (ref 0.00–4.00)

## 2017-06-26 ENCOUNTER — Telehealth: Payer: Self-pay

## 2017-06-26 NOTE — Telephone Encounter (Signed)
Letter sent.

## 2017-06-26 NOTE — Telephone Encounter (Signed)
-----   Message from Hollice Espy, MD sent at 06/26/2017  1:24 PM EDT ----- PSA stable at 6.41 which is up from your last value but lower than previous.  Hollice Espy, MD

## 2017-08-02 ENCOUNTER — Encounter: Payer: Self-pay | Admitting: Family Medicine

## 2017-08-03 ENCOUNTER — Encounter: Payer: Self-pay | Admitting: Family Medicine

## 2017-08-03 ENCOUNTER — Other Ambulatory Visit: Payer: Self-pay

## 2017-08-03 ENCOUNTER — Ambulatory Visit: Payer: 59 | Admitting: Family Medicine

## 2017-08-03 VITALS — BP 122/72 | HR 97 | Temp 98.7°F | Ht 66.75 in | Wt 218.0 lb

## 2017-08-03 DIAGNOSIS — G8929 Other chronic pain: Secondary | ICD-10-CM | POA: Diagnosis not present

## 2017-08-03 DIAGNOSIS — M545 Low back pain: Secondary | ICD-10-CM

## 2017-08-03 MED ORDER — SILDENAFIL CITRATE 100 MG PO TABS: ORAL_TABLET | ORAL | 0 refills | Status: DC

## 2017-08-03 MED ORDER — HYDROCODONE-ACETAMINOPHEN 5-325 MG PO TABS: ORAL_TABLET | ORAL | 0 refills | Status: DC

## 2017-08-03 MED ORDER — SUMATRIPTAN SUCCINATE 100 MG PO TABS
ORAL_TABLET | ORAL | 0 refills | Status: DC
Start: 2017-08-03 — End: 2021-10-22

## 2017-08-03 NOTE — Assessment & Plan Note (Signed)
Very limited vicodin use.  Use heat, stretching, massage and muscle relaxant  Vicodin for breakthrough pain.

## 2017-08-03 NOTE — Patient Instructions (Addendum)
Heat, start low back stretching, massage. Consider using muscle relaxant as needed at bedtime.  Use vicodin as needed for severe pain.  Follow up if not improving in 2 weeks.

## 2017-08-03 NOTE — Progress Notes (Signed)
   Subjective:    Patient ID: Dennis Hicks, male    DOB: 1956/01/13, 62 y.o.   MRN: [id number]  HPI  62 year old male with chronic low back pain presents with recent flare of back pain in low back.  Flare in right lower back... Sudden onset of pain when twisting back.  For the rest of the day pain continued intermittently, slightly improved.  No radiation of pain into right leg.  no numbness, no weakness.  no incontinence, no dysuria, no perineal numbness.  He had also slipped in bathtub 05/2017 hit left elbow and re-irritated  Low back  Using tylenol or asa 450 mg daily prn.   NSAIDS contraindicated given CKD.  Indication for chronic opioid: low back pain Medication and dose: vicodin 5/325 mg 1 tabs po daily prn only back pain # pills per month: 30 Last refill was 10/28/2017... Used 30 tabs in last 9 months Last UDS date: 10/28/2016 Opioid Treatment Agreement signed (Y/N): Y Opioid Treatment Agreement last reviewed with patient:   10/28/2016 [facility] reviewed this encounter (include red flags):   08/03/17  No red flags  Blood pressure 122/72, pulse 97, temperature 98.7 F (37.1 C), temperature source Oral, height 5' 6.75" (1.695 m), weight 218 lb (98.9 kg).   Review of Systems  Constitutional: Negative for fatigue and fever.  HENT: Negative for ear pain.   Eyes: Negative for pain.  Respiratory: Negative for cough and shortness of breath.   Cardiovascular: Negative for chest pain, palpitations and leg swelling.  Gastrointestinal: Negative for abdominal pain.  Genitourinary: Negative for dysuria.  Musculoskeletal: Negative for arthralgias.  Neurological: Negative for syncope, light-headedness and headaches.  Psychiatric/Behavioral: Negative for dysphoric mood.       Objective:   Physical Exam  Constitutional: Vital signs are normal. He appears well-developed and well-nourished.  HENT:  Head: Normocephalic.  Right Ear: Hearing normal.  Left Ear: Hearing normal.    Nose: Nose normal.  Mouth/Throat: Oropharynx is clear and moist and mucous membranes are normal.  Neck: Trachea normal. Carotid bruit is not present. No thyroid mass and no thyromegaly present.  Cardiovascular: Normal rate, regular rhythm and normal pulses. Exam reveals no gallop, no distant heart sounds and no friction rub.  No murmur heard. No peripheral edema  Pulmonary/Chest: Effort normal and breath sounds normal. No respiratory distress.  Musculoskeletal:       Lumbar back: He exhibits decreased range of motion and tenderness. He exhibits no bony tenderness.       Back:  Skin: Skin is warm, dry and intact. No rash noted.  Psychiatric: He has a normal mood and affect. His speech is normal and behavior is normal. Thought content normal.          Assessment & Plan:

## 2017-08-04 ENCOUNTER — Telehealth: Payer: Self-pay | Admitting: *Deleted

## 2017-08-04 NOTE — Telephone Encounter (Signed)
PA approved effective 07/05/2017 through 08/04/2018.

## 2017-08-04 NOTE — Telephone Encounter (Signed)
Received PA forms from [employer] for Sildenafil.  Forms completed and faxed back to [phone].  Awaiting decision.

## 2017-08-11 ENCOUNTER — Ambulatory Visit: Payer: 59 | Admitting: Family Medicine

## 2017-08-23 ENCOUNTER — Other Ambulatory Visit: Payer: Self-pay | Admitting: Family Medicine

## 2017-09-07 ENCOUNTER — Other Ambulatory Visit (INDEPENDENT_AMBULATORY_CARE_PROVIDER_SITE_OTHER): Payer: 59

## 2017-09-07 ENCOUNTER — Telehealth: Payer: Self-pay | Admitting: Family Medicine

## 2017-09-07 DIAGNOSIS — Z0000 Encounter for general adult medical examination without abnormal findings: Secondary | ICD-10-CM | POA: Diagnosis not present

## 2017-09-07 DIAGNOSIS — R972 Elevated prostate specific antigen [PSA]: Secondary | ICD-10-CM

## 2017-09-07 DIAGNOSIS — N183 Chronic kidney disease, stage 3 unspecified: Secondary | ICD-10-CM

## 2017-09-07 DIAGNOSIS — E7800 Pure hypercholesterolemia, unspecified: Secondary | ICD-10-CM

## 2017-09-07 DIAGNOSIS — E0822 Diabetes mellitus due to underlying condition with diabetic chronic kidney disease: Secondary | ICD-10-CM

## 2017-09-07 LAB — COMPREHENSIVE METABOLIC PANEL
ALK PHOS: 55 U/L (ref 39–117)
ALT: 27 U/L (ref 0–53)
AST: 17 U/L (ref 0–37)
Albumin: 4.2 g/dL (ref 3.5–5.2)
BUN: 19 mg/dL (ref 6–23)
CO2: 31 meq/L (ref 19–32)
Calcium: 9.9 mg/dL (ref 8.4–10.5)
Chloride: 100 mEq/L (ref 96–112)
Creatinine, Ser: 1.4 mg/dL (ref 0.40–1.50)
GFR: 54.84 mL/min — AB (ref >60.00)
GLUCOSE: 128 mg/dL — AB (ref 70–99)
POTASSIUM: 4.6 meq/L (ref 3.5–5.1)
SODIUM: 138 meq/L (ref 135–145)
TOTAL PROTEIN: 6.8 g/dL (ref 6.0–8.3)
Total Bilirubin: 0.5 mg/dL (ref 0.2–1.2)

## 2017-09-07 LAB — CBC WITH DIFFERENTIAL/PLATELET
BASOS ABS: 0.1 10*3/uL (ref 0.0–0.1)
Basophils Relative: 1 % (ref 0.0–3.0)
Eosinophils Absolute: 0.2 10*3/uL (ref 0.0–0.7)
Eosinophils Relative: 2.9 % (ref 0.0–5.0)
HCT: 44.9 % (ref 39.0–52.0)
Hemoglobin: 15.6 g/dL (ref 13.0–17.0)
LYMPHS ABS: 2.5 10*3/uL (ref 0.7–4.0)
Lymphocytes Relative: 29.7 % (ref 12.0–46.0)
MCHC: 34.7 g/dL (ref 30.0–36.0)
MCV: 87.3 fl (ref 78.0–100.0)
Monocytes Absolute: 0.7 10*3/uL (ref 0.1–1.0)
Monocytes Relative: 7.8 % (ref 3.0–12.0)
NEUTROS ABS: 4.9 10*3/uL (ref 1.4–7.7)
NEUTROS PCT: 58.6 % (ref 43.0–77.0)
PLATELETS: 269 10*3/uL (ref 150.0–400.0)
RBC: 5.14 Mil/uL (ref 4.22–5.81)
RDW: 13.4 % (ref 11.5–15.5)
WBC: 8.4 10*3/uL (ref 4.0–10.5)

## 2017-09-07 LAB — LIPID PANEL
CHOL/HDL RATIO: 4
Cholesterol: 151 mg/dL (ref 0–200)
HDL: 35 mg/dL — AB (ref >39.00)
LDL CALC: 93 mg/dL (ref 0–99)
NONHDL: 116.05
TRIGLYCERIDES: 114 mg/dL (ref 0.0–149.0)
VLDL: 22.8 mg/dL (ref 0.0–40.0)

## 2017-09-07 LAB — HEMOGLOBIN A1C: Hgb A1c MFr Bld: 7.4 % — ABNORMAL HIGH (ref 4.6–6.5)

## 2017-09-07 NOTE — Telephone Encounter (Signed)
-----   Message from Lendon Collar, RT sent at 08/29/2017 10:30 AM EDT ----- Regarding: Lab orders for Thursday 09/07/17 Please enter CPE lab orders for 09/07/17. Thanks-Lauren

## 2017-09-12 ENCOUNTER — Encounter: Payer: Self-pay | Admitting: Family Medicine

## 2017-09-12 ENCOUNTER — Ambulatory Visit (INDEPENDENT_AMBULATORY_CARE_PROVIDER_SITE_OTHER): Payer: 59 | Admitting: Family Medicine

## 2017-09-12 VITALS — BP 110/76 | HR 81 | Temp 98.7°F | Ht 66.5 in | Wt 215.5 lb

## 2017-09-12 DIAGNOSIS — N183 Chronic kidney disease, stage 3 unspecified: Secondary | ICD-10-CM

## 2017-09-12 DIAGNOSIS — E7800 Pure hypercholesterolemia, unspecified: Secondary | ICD-10-CM

## 2017-09-12 DIAGNOSIS — E1122 Type 2 diabetes mellitus with diabetic chronic kidney disease: Secondary | ICD-10-CM

## 2017-09-12 DIAGNOSIS — Z1211 Encounter for screening for malignant neoplasm of colon: Secondary | ICD-10-CM

## 2017-09-12 DIAGNOSIS — E0822 Diabetes mellitus due to underlying condition with diabetic chronic kidney disease: Secondary | ICD-10-CM

## 2017-09-12 DIAGNOSIS — I10 Essential (primary) hypertension: Secondary | ICD-10-CM

## 2017-09-12 DIAGNOSIS — Z0000 Encounter for general adult medical examination without abnormal findings: Secondary | ICD-10-CM | POA: Diagnosis not present

## 2017-09-12 LAB — HM DIABETES FOOT EXAM

## 2017-09-12 MED ORDER — METFORMIN HCL ER 500 MG PO TB24
500.0000 mg | ORAL_TABLET | Freq: Every day | ORAL | 11 refills | Status: DC
Start: 2017-09-12 — End: 2017-10-16

## 2017-09-12 NOTE — Patient Instructions (Addendum)
Start water exercise or other exercise.  Work on weight loss and low carb diet.  Make sure having yearly eye exam.  Start low dose metformin daily.  Can check blood sugars as needed.  Goal Fasting blood sugar 80-120.Marland Kitchen Too low < 60. 2 hours after meals goal blood sugar < 180.  Stop at lab for stool cards.

## 2017-09-12 NOTE — Progress Notes (Signed)
Subjective:    Patient ID: Dennis Hicks, male    DOB: 01-29-1956, 62 y.o.   MRN: [id number]  HPI The patient is here for annual wellness exam and preventative care.    Hypertension:    Good control on benazepril/HCTZ BP Readings from Last 3 Encounters:  09/12/17 110/76  08/03/17 122/72  06/23/17 133/67  Using medication without problems or lightheadedness:  none Chest pain with exertion:none Edema:none Short of breath:none Average home BPs: Other issues:  Elevated Cholesterol:  Good control on simvastatin. Lab Results  Component Value Date   CHOL 151 09/07/2017   HDL 35.00 (L) 09/07/2017   LDLCALC 93 09/07/2017   LDLDIRECT 158.2 01/03/2013   TRIG 114.0 09/07/2017   CHOLHDL 4 09/07/2017  Using medications without problems: Muscle aches:  Diet compliance:  good Exercise: staying active, no specific exercise Other complaints:  Migraine using aspirin or migraine for treatment.  Diabetes:   Worsened control on no med. Having trouble exercising given knee issues. Lab Results  Component Value Date   HGBA1C 7.4 (H) 09/07/2017  Using medications without difficulties: Hypoglycemic episodes: Hyperglycemic episodes: Feet problems: Blood Sugars averaging:not checking eye exam within last year: due  Wt Readings from Last 3 Encounters:  09/12/17 215 lb 8 oz (97.8 kg)  08/03/17 218 lb (98.9 kg)  06/23/17 215 lb (97.5 kg)      Elevated PSA: Followed by [facility]   CKD:  Stable GFR 54.   Review of Systems  Constitutional: Negative for fatigue and fever.  HENT: Negative for ear pain.   Eyes: Negative for pain.  Respiratory: Negative for cough and shortness of breath.   Cardiovascular: Negative for chest pain, palpitations and leg swelling.  Gastrointestinal: Negative for abdominal pain.  Genitourinary: Negative for dysuria.  Musculoskeletal: Negative for arthralgias.  Neurological: Positive for headaches. Negative for syncope and light-headedness.    Psychiatric/Behavioral: Negative for dysphoric mood.       Objective:   Physical Exam  Constitutional: He appears well-developed and well-nourished.  Non-toxic appearance. He does not appear ill. No distress.  HENT:  Head: Normocephalic and atraumatic.  Right Ear: Hearing, tympanic membrane, external ear and ear canal normal.  Left Ear: Hearing, tympanic membrane, external ear and ear canal normal.  Nose: Nose normal.  Mouth/Throat: Uvula is midline, oropharynx is clear and moist and mucous membranes are normal.  Eyes: Pupils are equal, round, and reactive to light. Conjunctivae, EOM and lids are normal. Lids are everted and swept, no foreign bodies found.  Neck: Trachea normal, normal range of motion and phonation normal. Neck supple. Carotid bruit is not present. No thyroid mass and no thyromegaly present.  Cardiovascular: Normal rate, regular rhythm, S1 normal, S2 normal, intact distal pulses and normal pulses. Exam reveals no gallop.  No murmur heard. Pulmonary/Chest: Breath sounds normal. He has no wheezes. He has no rhonchi. He has no rales.  Abdominal: Soft. Normal appearance and bowel sounds are normal. There is no hepatosplenomegaly. There is no tenderness. There is no rebound, no guarding and no CVA tenderness. No hernia. Hernia confirmed negative in the right inguinal area and confirmed negative in the left inguinal area.  Genitourinary: Prostate normal, testes normal and penis normal. Rectal exam shows no external hemorrhoid, no internal hemorrhoid, no fissure, no mass, no tenderness, anal tone normal and guaiac negative stool. Prostate is not enlarged and not tender. Right testis shows no mass and no tenderness. Left testis shows no mass and no tenderness. No paraphimosis or penile tenderness.  Lymphadenopathy:    He has no cervical adenopathy.       Right: No inguinal adenopathy present.       Left: No inguinal adenopathy present.  Neurological: He is alert. He has normal  strength and normal reflexes. No cranial nerve deficit or sensory deficit. Gait normal.  Skin: Skin is warm, dry and intact. No rash noted.  Psychiatric: He has a normal mood and affect. His speech is normal and behavior is normal. Judgment normal.     Diabetic foot exam: Normal inspection No skin breakdown No calluses  Normal DP pulses Normal sensation to light touch and monofilament Nails normal      Assessment & Plan:  The patient's preventative maintenance and recommended screening tests for an annual wellness exam were reviewed in full today. Brought up to date unless services declined.  Counselled on the importance of diet, exercise, and its role in overall health and mortality. The patient's FH and SH was reviewed, including their home life, tobacco status, and drug and alcohol status.   Colon: great aunt and uncle with colon cancer, no first degree relative. Plans to do iFOB yearly. Vaccines Refused Tdap, feel had on in last 10 years and given flu. PSA:   Lab Results  Component Value Date   PSA 6.29 (H) 11/26/2015   PSA 5.34 (H) 03/10/2015   PSA 3.76 03/03/2014  HIV: Refused. Nonsmoker Hep C: Done.

## 2017-09-12 NOTE — Assessment & Plan Note (Signed)
Well controlled. Continue current medication.  

## 2017-09-12 NOTE — Assessment & Plan Note (Signed)
Now at goal on higher dose simvastatin. Encouraged exercise, weight loss, healthy eating habits.

## 2017-09-12 NOTE — Assessment & Plan Note (Signed)
Poor control.. Not able to exercise and lose weight. Start low dose metformin. Follow GFR.

## 2017-09-12 NOTE — Assessment & Plan Note (Signed)
Stable control. Due to DM and HTN.

## 2017-10-16 ENCOUNTER — Other Ambulatory Visit: Payer: Self-pay | Admitting: Family Medicine

## 2017-11-04 ENCOUNTER — Other Ambulatory Visit: Payer: Self-pay | Admitting: Family Medicine

## 2017-11-20 ENCOUNTER — Other Ambulatory Visit: Payer: Self-pay | Admitting: Family Medicine

## 2017-12-02 ENCOUNTER — Telehealth: Payer: Self-pay | Admitting: Family Medicine

## 2017-12-02 DIAGNOSIS — N183 Chronic kidney disease, stage 3 (moderate): Principal | ICD-10-CM

## 2017-12-02 DIAGNOSIS — E0822 Diabetes mellitus due to underlying condition with diabetic chronic kidney disease: Secondary | ICD-10-CM

## 2017-12-02 NOTE — Telephone Encounter (Signed)
-----   Message from Lendon Collar, RT sent at 11/27/2017  1:32 PM EDT ----- Regarding: Lab orders for Thursday 12/07/17 Please enter 3 month follow up labs for 12/07/17. Thanks!

## 2017-12-07 ENCOUNTER — Telehealth: Payer: Self-pay | Admitting: Family Medicine

## 2017-12-07 ENCOUNTER — Other Ambulatory Visit (INDEPENDENT_AMBULATORY_CARE_PROVIDER_SITE_OTHER): Payer: 59

## 2017-12-07 DIAGNOSIS — N183 Chronic kidney disease, stage 3 unspecified: Secondary | ICD-10-CM

## 2017-12-07 DIAGNOSIS — E0822 Diabetes mellitus due to underlying condition with diabetic chronic kidney disease: Secondary | ICD-10-CM | POA: Diagnosis not present

## 2017-12-07 NOTE — Telephone Encounter (Signed)
Form completed and placed in Dr. Bedsole's in box for signature. 

## 2017-12-07 NOTE — Addendum Note (Signed)
Addended by: Ellamae Sia on: 12/07/2017 08:10 AM   Modules accepted: Orders

## 2017-12-07 NOTE — Telephone Encounter (Signed)
Pt dropped off [facility] physician results form to be filled out In dr Diona Browner [facility] up front

## 2017-12-08 ENCOUNTER — Encounter: Payer: Self-pay | Admitting: Urology

## 2017-12-08 ENCOUNTER — Ambulatory Visit: Payer: 59 | Admitting: Urology

## 2017-12-08 ENCOUNTER — Other Ambulatory Visit: Payer: Self-pay | Admitting: Urology

## 2017-12-08 VITALS — BP 136/85 | HR 102 | Ht 67.0 in | Wt 214.0 lb

## 2017-12-08 DIAGNOSIS — C61 Malignant neoplasm of prostate: Secondary | ICD-10-CM

## 2017-12-08 LAB — HEMOGLOBIN A1C
ESTIMATED AVERAGE GLUCOSE: 151 mg/dL
Hgb A1c MFr Bld: 6.9 % — ABNORMAL HIGH (ref 4.8–5.6)

## 2017-12-08 MED ORDER — LORAZEPAM 1 MG PO TABS: 1.0000 mg | ORAL_TABLET | Freq: Three times a day (TID) | ORAL | 0 refills | Status: DC

## 2017-12-08 MED ORDER — GENTAMICIN SULFATE 40 MG/ML IJ SOLN
80.0000 mg | Freq: Once | INTRAMUSCULAR | Status: AC
  Administered 2017-12-08: 80 mg via INTRAMUSCULAR

## 2017-12-08 MED ORDER — LEVOFLOXACIN 500 MG PO TABS
500.0000 mg | ORAL_TABLET | Freq: Once | ORAL | Status: AC
  Administered 2017-12-08: 500 mg via ORAL

## 2017-12-08 NOTE — Progress Notes (Signed)
   12/08/17  CC:  Chief Complaint  Patient presents with  . Procedure    HPI: 62 year old male with a personal history of low volume Gleason 3+4 prostate cancer, suspicious endorectal prostate MRI and active surveillance who returns today for the biopsy as part of his surveillance evaluation.    Blood pressure 136/85, pulse (!) 102, height 5\' 7"  (1.702 m), weight 214 lb (97.1 kg). NED. A&Ox3.   No respiratory distress   Abd soft, NT, ND Normal sphincter tone, regular prostate without significant nodules or lesions.  Prostate Biopsy Procedure   Informed consent was obtained after discussing risks/benefits of the procedure.  A time out was performed to ensure correct patient identity.  Pre-Procedure: - Gentamicin given prophylactically - Levaquin 500 mg administered PO -Transrectal Ultrasound performed revealing a 50.2 gm prostate -No significant hypoechoic or median lobe noted  Procedure: - Prostate block performed using 10 cc 1% lidocaine and biopsies taken from sextant areas, a total of 12 under ultrasound guidance.  Post-Procedure: - Patient tolerated the procedure well - He was counseled to seek immediate medical attention if experiences any severe pain, significant bleeding, or fevers - Return in one-two week to discuss biopsy results -Reflex genetic testing to be performed on specimen as well, discussed with pataient    Hollice Espy, MD   Addendum: Patient tolerated biopsy well.  After he went home, he had what sounds like a presyncopal episode and is experiencing severe rectal pain without significant bleeding.  He did come in and was evaluated, but dynamically stable and afebrile.Marland Kitchen  He was offered a prescription of p.o. Ativan for presumed rectal spasm.  Strict warning signs were reviewed with the patient indications for urgent evaluation in the emergency room.

## 2017-12-08 NOTE — Progress Notes (Signed)
Pt returned to clinic this afternoon, post biopsy. He is complaining of extreme discomfort and pain deep in the hypogastric region. He states he took a Hydrocodone to help, but has no relief.  He states he has had 2 BM with blood present. Pt was offered an injection of Ketorolac, however he denies because of his adverse reactions to ibuprofen. He was offered Ativan to help with rectum spasms. He opted for this medication. He states while lying left lateral recumbent, the pain began to ease off. Pt states pain is 10/10 while in fowlers position or supine. Since Dr. Erlene Quan is in [facility] clinic this afternoon, pt opted to go to [facility] clinic to pick up prescription for Ativan. Pt encouraged to seek medical advice at ED if fever or chills present. Also encouraged to seek help at ED if pain becomes intolerable.

## 2017-12-11 NOTE — Telephone Encounter (Signed)
Form completed and faxed to [place] at [phone].

## 2017-12-12 ENCOUNTER — Encounter: Payer: Self-pay | Admitting: Family Medicine

## 2017-12-12 ENCOUNTER — Ambulatory Visit: Payer: 59 | Admitting: Family Medicine

## 2017-12-12 VITALS — BP 102/67 | HR 94 | Temp 98.9°F | Ht 66.5 in | Wt 215.5 lb

## 2017-12-12 DIAGNOSIS — N183 Chronic kidney disease, stage 3 unspecified: Secondary | ICD-10-CM

## 2017-12-12 DIAGNOSIS — I10 Essential (primary) hypertension: Secondary | ICD-10-CM

## 2017-12-12 DIAGNOSIS — E0822 Diabetes mellitus due to underlying condition with diabetic chronic kidney disease: Secondary | ICD-10-CM

## 2017-12-12 NOTE — Assessment & Plan Note (Signed)
Well controlled. Continue current medication. Encouraged exercise, weight loss, healthy eating habits.  

## 2017-12-12 NOTE — Patient Instructions (Signed)
Get back to low carbohydrate diet.  Increase exercise 3-5 times a week.  Continue metformin. Set up yearly eye exam.

## 2017-12-12 NOTE — Progress Notes (Signed)
   Subjective:    Patient ID: Dennis Hicks, male    DOB: Jul 02, 1955, 62 y.o.   MRN: [id number]  HPI 62 year old male presents for DM follow up.  Diabetes:   Excellent improvement in control on  low dose metformin.  he does reports he has not been eating as well as he was. Lab Results  Component Value Date   HGBA1C 6.9 (H) 12/07/2017  Using medications without difficulties: Hypoglycemic episodes: Hyperglycemic episodes:?? Feet problems: no ulcers Blood Sugars averaging: not checking blood sugars eye exam within last year: no  Wt Readings from Last 3 Encounters:  12/12/17 215 lb 8 oz (97.8 kg)  12/08/17 214 lb (97.1 kg)  09/12/17 215 lb 8 oz (97.8 kg)   Hypertension:  Good control on BP on benazepril HCTZ Using medication without problems or lightheadedness: none Chest pain with exertion: none Edema:none Short of breath:none Average home BPs: Other issues:    Blood pressure 102/67, pulse 94, temperature 98.9 F (37.2 C), temperature source Oral, height 5' 6.5" (1.689 m), weight 215 lb 8 oz (97.8 kg).  Review of Systems  Constitutional: Negative for fatigue and fever.  HENT: Negative for ear pain.   Eyes: Negative for pain.  Respiratory: Negative for cough and shortness of breath.   Cardiovascular: Negative for chest pain, palpitations and leg swelling.  Gastrointestinal: Negative for abdominal pain.  Genitourinary: Negative for dysuria.  Musculoskeletal: Negative for arthralgias.  Neurological: Negative for syncope, light-headedness and headaches.  Psychiatric/Behavioral: Negative for dysphoric mood.       Objective:   Physical Exam  Constitutional: Vital signs are normal. He appears well-developed and well-nourished.  HENT:  Head: Normocephalic.  Right Ear: Hearing normal.  Left Ear: Hearing normal.  Nose: Nose normal.  Mouth/Throat: Oropharynx is clear and moist and mucous membranes are normal.  Neck: Trachea normal. Carotid bruit is not present. No  thyroid mass and no thyromegaly present.  Cardiovascular: Normal rate, regular rhythm and normal pulses. Exam reveals no gallop, no distant heart sounds and no friction rub.  No murmur heard. No peripheral edema  Pulmonary/Chest: Effort normal and breath sounds normal. No respiratory distress.  Skin: Skin is warm, dry and intact. No rash noted.  Psychiatric: He has a normal mood and affect. His speech is normal and behavior is normal. Thought content normal.     Diabetic foot exam: Normal inspection No skin breakdown No calluses  Normal DP pulses Normal sensation to light touch and monofilament Nails normal      Assessment & Plan:

## 2017-12-15 ENCOUNTER — Telehealth: Payer: Self-pay | Admitting: Urology

## 2017-12-15 ENCOUNTER — Other Ambulatory Visit: Payer: Self-pay | Admitting: Urology

## 2017-12-15 DIAGNOSIS — C61 Malignant neoplasm of prostate: Secondary | ICD-10-CM

## 2017-12-15 LAB — PATHOLOGY REPORT

## 2017-12-15 NOTE — Telephone Encounter (Signed)
Call patient to discuss upstaging of his prostate cancer, now high risk.  We briefly discussed that he is no longer a candidate for active surveillance and will need to have therapy.  We will discuss this further at his follow-up appointment on October 8.  Prior to this, I would like him to go ahead and have a staging CT of the pelvis given that his MRI was quite some time ago to rule out metastatic disease.  Ideally, this could be done prior to her visit.  Order placed.  Hollice Espy, MD

## 2017-12-18 NOTE — Telephone Encounter (Signed)
CT has been approved and I will have Manuela Schwartz in scheduling call him today.  Sharyn Lull

## 2017-12-19 ENCOUNTER — Ambulatory Visit
Admission: RE | Admit: 2017-12-19 | Discharge: 2017-12-19 | Disposition: A | Discharge status: Home / Self Care | Payer: 59 | Source: Ambulatory Visit | Attending: Urology | Admitting: Urology

## 2017-12-19 DIAGNOSIS — C61 Malignant neoplasm of prostate: Secondary | ICD-10-CM | POA: Insufficient documentation

## 2017-12-19 HISTORY — DX: Type 2 diabetes mellitus without complications: E11.9

## 2017-12-19 LAB — POCT I-STAT CREATININE: CREATININE: 1.2 mg/dL (ref 0.61–1.24)

## 2017-12-19 MED ORDER — IOPAMIDOL (ISOVUE-300) INJECTION 61%
100.0000 mL | Freq: Once | INTRAVENOUS | Status: AC | PRN
  Administered 2017-12-19: 100 mL via INTRAVENOUS

## 2017-12-26 ENCOUNTER — Ambulatory Visit (INDEPENDENT_AMBULATORY_CARE_PROVIDER_SITE_OTHER): Payer: 59 | Admitting: Urology

## 2017-12-26 ENCOUNTER — Encounter: Payer: Self-pay | Admitting: Urology

## 2017-12-26 VITALS — BP 124/80 | HR 97 | Ht 67.0 in | Wt 215.0 lb

## 2017-12-26 DIAGNOSIS — C61 Malignant neoplasm of prostate: Secondary | ICD-10-CM

## 2017-12-26 NOTE — Progress Notes (Signed)
12/26/2017 3:20 PM   Dennis Hicks 24-Feb-1956 [id number]  Referring provider: Jinny Sanders, MD [address] [name], [place] [address]  Chief Complaint  Patient presents with  . Prostate Cancer    Biopsy Results    HPI: 62 year old male with a personal history of prostate cancer previously diagnosed by Dr. Junious Silk on active surveillance who returns today after repeat prostate biopsy shows upstaging of his disease to high risk.  He has a personal history of prostate cancer, diagnosed with low volume intermediate risk prostate cancer in August 2018.  PSA at the time of diagnosis was 6.1, as high as 7.4.  CT1c, TRUS vol 43 cc.    As part of his continued surveillance, he underwent prostate MRI in June 2018 which showed 2 PI-RADS 4 lesions in the left mid gland as well as the right anterior mid gland without evidence of metastatic disease, adenopathy, or extracapsular extension.  He did have thickened vas deferens bilaterally.  He underwent prostate biopsy on 12/08/2017 which shows an upstaging of his prostate cancer.  This now reveals Gleason 4+4 in 1 core at the right apex involving up to 68% of the tissue.  Most recent PSA 6.41.    He was called with the aforementioned results and underwent further staging with CT pelvis with contrast on 12/19/2017 which showed no evidence of metastatic disease.  No previous abdominal surgeries.  He does have mild to moderate ED, SHIM below.  Minimal urinary symptoms.  IPSS    Row Name 12/26/17 1700         International Prostate Symptom Score   How often have you had the sensation of not emptying your bladder?  About half the time     How often have you had to urinate less than every two hours?  About half the time     How often have you found you stopped and started again several times when you urinated?  About half the time     How often have you found it difficult to postpone urination?  Less than half the time     How often  have you had a weak urinary stream?  About half the time     How often have you had to strain to start urination?  Less than 1 in 5 times     How many times did you typically get up at night to urinate?  5 Times     Total IPSS Score  20       Quality of Life due to urinary symptoms   If you were to spend the rest of your life with your urinary condition just the way it is now how would you feel about that?  Mostly Satisfied        Score:  1-7 Mild 8-19 Moderate 20-35 Severe  SHIM    Row Name 12/26/17 1707         SHIM: Over the last 6 months:   How do you rate your confidence that you could get and keep an erection?  Moderate     When you had erections with sexual stimulation, how often were your erections hard enough for penetration (entering your partner)?  Sometimes (about half the time)     During sexual intercourse, how often were you able to maintain your erection after you had penetrated (entered) your partner?  Sometimes (about half the time)     During sexual intercourse, how difficult was it to maintain  your erection to completion of intercourse?  Difficult     When you attempted sexual intercourse, how often was it satisfactory for you?  Sometimes (about half the time)       SHIM Total Score   SHIM  15        Score: 1-7 Severe ED 8-11 Moderate ED 12-16 Mild-Moderate ED 17-21 Mild ED 22-25 No ED   PMH: Past Medical History:  Diagnosis Date  . Borderline diabetes   . Diabetes mellitus without complication ([place])   . Heartburn   . HLD (hyperlipidemia)   . Hypertension   . Melanoma ([place]) 2018   Right chest area resected  . Psoriasis     Surgical History: No past surgical history on file.  Home Medications:  Allergies as of 12/26/2017      Reactions   Chocolate Other (See Comments)   Migraine   Shrimp [shellfish Allergy] Other (See Comments)   Migraines      Medication List        Accurate as of 12/26/17 11:59 PM. Always use your most recent med  list.          benazepril-hydrochlorthiazide 20-25 MG tablet Commonly known as:  LOTENSIN HCT TAKE 1 TABLET DAILY   betamethasone dipropionate 0.05 % cream Commonly known as:  DIPROLENE Apply 1 application topically 2 (two) times daily.   famotidine 10 MG chewable tablet Commonly known as:  PEPCID AC Chew 10 mg by mouth daily.   HYDROcodone-acetaminophen 5-325 MG tablet Commonly known as:  NORCO/VICODIN Take one tablet every 6-8 hours as needed for pain.   LORazepam 1 MG tablet Commonly known as:  ATIVAN Take 1 tablet (1 mg total) by mouth every 8 (eight) hours.   metFORMIN 500 MG 24 hr tablet Commonly known as:  GLUCOPHAGE-XR TAKE 1 TABLET BY MOUTH EVERY MORNING WITH BREAKFAST   sildenafil 100 MG tablet Commonly known as:  VIAGRA TAKE 1 TABLET AS NEEDED FOR ERECTILE DYSFUNCTION   simvastatin 40 MG tablet Commonly known as:  ZOCOR Take 1 tablet (40 mg total) by mouth daily.   SUMAtriptan 100 MG tablet Commonly known as:  IMITREX TAKE 1 TABLET BY MOUTH EVERY 2 HOURS AS NEEDED, MAX OF 2 TABLETS IN 24 HOURS       Allergies:  Allergies  Allergen Reactions  . Chocolate Other (See Comments)    Migraine  . Shrimp [Shellfish Allergy] Other (See Comments)    Migraines    Family History: Family History  Problem Relation Age of Onset  . Hypertension Mother   . Coronary artery disease Father   . Hypertension Father   . Hyperlipidemia Father   . Diabetes Father   . Prostate cancer Neg Hx   . Kidney cancer Neg Hx   . Bladder Cancer Neg Hx     Social History:  reports that he has never smoked. He has never used smokeless tobacco. He reports that he drinks alcohol. He reports that he does not use drugs.  ROS: UROLOGY Frequent Urination?: No Hard to postpone urination?: No Burning/pain with urination?: No Get up at night to urinate?: No Leakage of urine?: No Urine stream starts and stops?: No Trouble starting stream?: No Do you have to strain to urinate?:  No Blood in urine?: No Urinary tract infection?: No Sexually transmitted disease?: No Injury to kidneys or bladder?: No Painful intercourse?: No Weak stream?: No Erection problems?: No Penile pain?: No  Gastrointestinal Nausea?: No Vomiting?: No Indigestion/heartburn?: No Diarrhea?: No Constipation?: No  Constitutional Fever: No Night sweats?: No Weight loss?: No Fatigue?: No  Skin Skin rash/lesions?: No Itching?: No  Eyes Blurred vision?: No  Ears/Nose/Throat Sore throat?: No Sinus problems?: No  Hematologic/Lymphatic Swollen glands?: No Easy bruising?: No  Cardiovascular Leg swelling?: No Chest pain?: No  Respiratory Cough?: No Shortness of breath?: No  Endocrine Excessive thirst?: No  Musculoskeletal Back pain?: No Joint pain?: No  Neurological Headaches?: No Dizziness?: No  Psychologic Depression?: No Anxiety?: No  Physical Exam: BP 124/80   Pulse 97   Ht 5\' 7"  (1.702 m)   Wt 215 lb (97.5 kg)   BMI 33.67 kg/m   Constitutional:  Alert and oriented, No acute distress.  Accompanied by wife today. HEENT: [place] AT, moist mucus membranes.  Trachea midline, no masses. Cardiovascular: No clubbing, cyanosis, or edema. Respiratory: Normal respiratory effort, no increased work of breathing. GI: Abdomen is soft, nontender, nondistended, no abdominal masses, obese, no scars. Skin: No rashes, bruises or suspicious lesions. Neurologic: Grossly intact, no focal deficits, moving all 4 extremities. Psychiatric: Normal mood and affect.  Laboratory Data: Lab Results  Component Value Date   WBC 8.4 09/07/2017   HGB 15.6 09/07/2017   HCT 44.9 09/07/2017   MCV 87.3 09/07/2017   PLT 269.0 09/07/2017    Lab Results  Component Value Date   CREATININE 1.20 12/19/2017    Lab Results  Component Value Date   PSA 6.29 (H) 11/26/2015   PSA 5.34 (H) 03/10/2015   PSA 3.76 03/03/2014    Lab Results  Component Value Date   HGBA1C 6.9 (H) 12/07/2017     Urinalysis    Component Value Date/Time   COLORURINE STRAW (A) 05/13/2016 0859   APPEARANCEUR Clear 07/11/2016 1503   LABSPEC 1.015 05/13/2016 0859   PHURINE 7.5 05/13/2016 0859   GLUCOSEU Negative 07/11/2016 1503   HGBUR NEGATIVE 05/13/2016 0859   BILIRUBINUR Negative 07/11/2016 1503   KETONESUR NEGATIVE 05/13/2016 0859   PROTEINUR Negative 07/11/2016 1503   PROTEINUR NEGATIVE 05/13/2016 0859   UROBILINOGEN 0.2 04/26/2016 1110   NITRITE Negative 07/11/2016 1503   NITRITE NEGATIVE 05/13/2016 0859   LEUKOCYTESUR Negative 07/11/2016 1503    Lab Results  Component Value Date   LABMICR See below: 07/11/2016   WBCUA 0-5 07/11/2016   RBCUA 0-2 07/11/2016   LABEPIT None seen 07/11/2016   BACTERIA None seen 07/11/2016    Pertinent Imaging: Results for orders placed during the hospital encounter of 06/06/16  CT HEMATURIA WORKUP   Narrative CLINICAL DATA:  Recent episode of gross hematuria. No current symptoms are reported. Chronic kidney disease. History of melanoma.  EXAM: CT ABDOMEN AND PELVIS WITHOUT AND WITH CONTRAST  TECHNIQUE: Multidetector CT imaging of the abdomen and pelvis was performed following the standard protocol before and following the bolus administration of intravenous contrast.  CONTRAST:  125 cc Isovue-300 IV.  COMPARISON:  None.  FINDINGS: Lower chest: No significant pulmonary nodules or acute consolidative airspace disease. Left circumflex coronary atherosclerosis.  Hepatobiliary: Mild diffuse hepatic steatosis. Normal liver size. No liver surface irregularity. No liver mass Normal gallbladder with no radiopaque cholelithiasis. No biliary ductal dilatation.  Pancreas: Normal, with no mass or duct dilation.  Spleen: Normal size. No mass.  Adrenals/Urinary Tract: Normal adrenals. Nonobstructing 3 mm interpolar left renal stone. No additional renal stones. No hydronephrosis. Normal caliber ureters, with no ureteral stones. No right renal  cortical lesions. Two subcentimeter hypodense renal cortical lesions in the lower left kidney are too small to characterize and require no  further follow-up. On delayed imaging, there is no urothelial wall thickening and there are no filling defects in the opacified portions of the bilateral collecting systems or ureters. No bladder stones, wall thickening, masses or diverticula.  Stomach/Bowel: Small hiatal hernia. Otherwise grossly normal stomach. Normal caliber small bowel with no small bowel wall thickening. Normal appendix. Normal large bowel with no diverticulosis, large bowel wall thickening or pericolonic fat stranding.  Vascular/Lymphatic: Atherosclerotic nonaneurysmal abdominal aorta. Patent portal, splenic, hepatic and renal veins. No pathologically enlarged lymph nodes in the abdomen or pelvis.  Reproductive: Prostate dimensions 4.3 x 3.9 x 4.6 cm (volume = 40 cm^3). Mildly enlarged prostate. Nonspecific internal prostatic calcifications.  Other: No pneumoperitoneum, ascites or focal fluid collection.  Musculoskeletal: No aggressive appearing focal osseous lesions. Moderate thoracolumbar spondylosis.  IMPRESSION: 1. Solitary nonobstructing 3 mm left renal stone. No hydronephrosis. No ureteral or bladder stones. 2. No suspicious renal cortical masses.  No urothelial lesions . 3. Mildly enlarged prostate. 4. Additional findings include aortic atherosclerosis, coronary atherosclerosis, mild diffuse hepatic steatosis and small hiatal hernia.   Electronically Signed   By: Ilona Sorrel M.D.   On: 06/06/2016 16:34    CT scan personally reviewed today.  Assessment & Plan:    1. Prostate cancer ([place]) 62 year old male with now evidence of high risk prostate cancer, Gleason 4+4 without evidence of metastatic disease [facility] nomogram was reviewed personally today with the patient We discussed treatment options at length today including radical prostatectomy with bilateral  lymph node dissection versus IMRT with 2 to 3 years of ADT Active surveillance is no longer treatment option as per both AUA/NCCN guidelines  We discussed surgical therapy for prostate cancer including the different available surgical approaches. We discussed, in detail, the risks and expectations of surgery with regard to cancer control, urinary control, and erectile dysfunction as well as expected post operative cover he processed. Additional risks of surgery including but not limalited to bleeding, infection, hernia formation, nerve damage, fistula formation, bowel/rect injury, potentially necessitating colostomy, damage to the urinary tract resulting in urinary leakage, urethral stricture, and cardiopulmonary risk such as myocardial infarction, stroke, death, thromboembolism etc. were explained. The risk of open surgical conversion for robotics/laparoscopic prostatectomy is also discussed.  The preoperative, intraoperative, and postoperative courses were also discussed.  Need for Foley catheter for 1 week postop was also reviewed.  All questions were answered today.  I would like him to see the radiation oncology as well for an alternative opinion.  He will let us know how he like to proceed based on these conversations.  - Ambulatory referral to [place], MD  [facility] [address] [place], [place] [address] [phone]  I spent 40 min with this patient of which greater than 50% was spent in counseling and coordination of care with the patient.

## 2017-12-29 DIAGNOSIS — L578 Other skin changes due to chronic exposure to nonionizing radiation: Secondary | ICD-10-CM | POA: Diagnosis not present

## 2017-12-29 DIAGNOSIS — Z1283 Encounter for screening for malignant neoplasm of skin: Secondary | ICD-10-CM | POA: Diagnosis not present

## 2017-12-29 DIAGNOSIS — Z85820 Personal history of malignant melanoma of skin: Secondary | ICD-10-CM | POA: Diagnosis not present

## 2018-01-04 ENCOUNTER — Ambulatory Visit
Admission: RE | Admit: 2018-01-04 | Discharge: 2018-01-04 | Disposition: A | Discharge status: Home / Self Care | Payer: 59 | Source: Ambulatory Visit | Attending: Radiation Oncology | Admitting: Radiation Oncology

## 2018-01-04 ENCOUNTER — Institutional Professional Consult (permissible substitution): Payer: 59 | Admitting: Radiation Oncology

## 2018-01-04 ENCOUNTER — Encounter: Payer: Self-pay | Admitting: Radiation Oncology

## 2018-01-04 ENCOUNTER — Other Ambulatory Visit: Payer: Self-pay

## 2018-01-04 VITALS — BP 145/89 | HR 101 | Temp 98.0°F | Resp 18 | Wt 215.2 lb

## 2018-01-04 DIAGNOSIS — N529 Male erectile dysfunction, unspecified: Secondary | ICD-10-CM | POA: Diagnosis not present

## 2018-01-04 DIAGNOSIS — I10 Essential (primary) hypertension: Secondary | ICD-10-CM | POA: Diagnosis not present

## 2018-01-04 DIAGNOSIS — R350 Frequency of micturition: Secondary | ICD-10-CM | POA: Diagnosis not present

## 2018-01-04 DIAGNOSIS — C61 Malignant neoplasm of prostate: Secondary | ICD-10-CM | POA: Diagnosis present

## 2018-01-04 DIAGNOSIS — E785 Hyperlipidemia, unspecified: Secondary | ICD-10-CM | POA: Insufficient documentation

## 2018-01-04 DIAGNOSIS — E119 Type 2 diabetes mellitus without complications: Secondary | ICD-10-CM | POA: Insufficient documentation

## 2018-01-04 NOTE — Consult Note (Signed)
NEW PATIENT EVALUATION  Name: Dennis Hicks  MRN: [id number]  Date:   01/04/2018     DOB: 10/03/1955   This 62 y.o. male patient presents to the clinic for initial evaluation of adenocarcinoma the prostate Gleason score of 8 (4+4) in 1 core biopsy presenting the PSA of 6.8. Stage IIB disease.  REFERRING PHYSICIAN: Jinny Sanders, MD  CHIEF COMPLAINT:  Chief Complaint  Patient presents with  . Prostate Cancer    Pt is here for initial consultation of prostate cancer.     DIAGNOSIS: The encounter diagnosis was Malignant neoplasm of prostate ([place]).   PREVIOUS INVESTIGATIONS:  MRI scans and CT scans reviewed Pathology reports reviewed Clinical notes reviewed  HPI: patient is a 62 year old male originally diagnosed back in August 2018 with a Gleason 7 (3+4) adenocarcinoma the prostate presenting the PSA of 6.1. At that time he had an MRI scan in June 2018 showing A. Fib lesions in the left mid prostate as well as right anterior prostate. Patient opted for watchful waiting has been followed by Dr. Erlene Quan with his PSA going to 6.8. He underwent rebiopsy of his prostate in September of staging his cancer with now a Gleason score of 8 (4+41 core of the right neck apex involving 60% of the tissue. Patient does have some urinary frequency and urgency and does have erectile dysfunction. Treatment options have been discussed with the patient and he is now referred to radiation oncology for second opinion.  PLANNED TREATMENT REGIMEN: probable robotic prostatectomy  PAST MEDICAL HISTORY:  has a past medical history of Borderline diabetes, Diabetes mellitus without complication ([place]), Heartburn, HLD (hyperlipidemia), Hypertension, Melanoma ([place]) (2018), and Psoriasis.    PAST SURGICAL HISTORY: History reviewed. No pertinent surgical history.  FAMILY HISTORY: family history includes Coronary artery disease in his father; Diabetes in his father; Hyperlipidemia in his father; Hypertension in his  father and mother.  SOCIAL HISTORY:  reports that he has never smoked. He has never used smokeless tobacco. He reports that he drinks alcohol. He reports that he does not use drugs.  ALLERGIES: Chocolate and Shrimp [shellfish allergy]  MEDICATIONS:  Current Outpatient Medications  Medication Sig Dispense Refill  . benazepril-hydrochlorthiazide (LOTENSIN HCT) 20-25 MG tablet TAKE 1 TABLET DAILY 90 tablet 1  . betamethasone dipropionate (DIPROLENE) 0.05 % cream Apply 1 application topically 2 (two) times daily. 30 g 0  . famotidine (PEPCID AC) 10 MG chewable tablet Chew 10 mg by mouth daily.     Marland Kitchen LORazepam (ATIVAN) 1 MG tablet Take 1 tablet (1 mg total) by mouth every 8 (eight) hours. 3 tablet 0  . metFORMIN (GLUCOPHAGE-XR) 500 MG 24 hr tablet TAKE 1 TABLET BY MOUTH EVERY MORNING WITH BREAKFAST 30 tablet 11  . sildenafil (VIAGRA) 100 MG tablet TAKE 1 TABLET AS NEEDED FOR ERECTILE DYSFUNCTION 36 tablet 0  . simvastatin (ZOCOR) 40 MG tablet Take 1 tablet (40 mg total) by mouth daily. 90 tablet 3  . SUMAtriptan (IMITREX) 100 MG tablet TAKE 1 TABLET BY MOUTH EVERY 2 HOURS AS NEEDED, MAX OF 2 TABLETS IN 24 HOURS 10 tablet 0  . HYDROcodone-acetaminophen (NORCO/VICODIN) 5-325 MG tablet Take one tablet every 6-8 hours as needed for pain. (Patient not taking: Reported on 01/04/2018) 30 tablet 0   No current facility-administered medications for this encounter.     ECOG PERFORMANCE STATUS:  0 - Asymptomatic  REVIEW OF SYSTEMS:  Patient denies any weight loss, fatigue, weakness, fever, chills or night sweats. Patient denies any loss  of vision, blurred vision. Patient denies any ringing  of the ears or hearing loss. No irregular heartbeat. Patient denies heart murmur or history of fainting. Patient denies any chest pain or pain radiating to her upper extremities. Patient denies any shortness of breath, difficulty breathing at night, cough or hemoptysis. Patient denies any swelling in the lower legs.  Patient denies any nausea vomiting, vomiting of blood, or coffee ground material in the vomitus. Patient denies any stomach pain. Patient states has had normal bowel movements no significant constipation or diarrhea. Patient denies any dysuria, hematuria or significant nocturia. Patient denies any problems walking, swelling in the joints or loss of balance. Patient denies any skin changes, loss of hair or loss of weight. Patient denies any excessive worrying or anxiety or significant depression. Patient denies any problems with insomnia. Patient denies excessive thirst, polyuria, polydipsia. Patient denies any swollen glands, patient denies easy bruising or easy bleeding. Patient denies any recent infections, allergies or URI. Patient "s visual fields have not changed significantly in recent time.    PHYSICAL EXAM: BP (!) 145/89 (BP Location: Left Arm, Patient Position: Sitting)   Pulse (!) 101   Temp 98 F (36.7 C) (Tympanic)   Resp 18   Wt 215 lb 2.7 oz (97.6 kg)   BMI 33.70 kg/m  On rectal exam prostate is approximate 40 g. Prostate issmooth without evidence of nodularity or mass sulcus preserved bilaterally. No other rectal abnormality is identified.Well-developed well-nourished patient in NAD. HEENT reveals PERLA, EOMI, discs not visualized.  Oral cavity is clear. No oral mucosal lesions are identified. Neck is clear without evidence of cervical or supraclavicular adenopathy. Lungs are clear to A&P. Cardiac examination is essentially unremarkable with regular rate and rhythm without murmur rub or thrill. Abdomen is benign with no organomegaly or masses noted. Motor sensory and DTR levels are equal and symmetric in the upper and lower extremities. Cranial nerves II through XII are grossly intact. Proprioception is intact. No peripheral adenopathy or edema is identified. No motor or sensory levels are noted. Crude visual fields are within normal range.  LABORATORY DATA:p athology reports reviewed  PSA reports reviewed    RADIOLOGY RESULTS:MRI scan and CT scans are reviewed   IMPRESSION: stage IIB adenocarcinoma the prostate Gleason score of 8 (55+71) in 62 year old male  PLAN: this time I have run the [facility] nomogram. He has only a 40% chance of organ confined disease with 58% chance of extracapsular extension lymph node involvement is 9% with seminal vesicle invasion at 7%. I still believe he is an excellent candidate for robotic prostatectomy and I would use radiation therapy as salvage should he progress with biochemical failure after completion of treatment or if there's positive nodes some of vesicle involvement or extracapsular extension. We discussed radiation therapy including external beam and seed implant and the inability to salvage with surgery if he fails after radiation therapy.I believe the patient is leaning towards robotic prostatectomy. Should he decline that I would favor seed implantation. Patient also may benefit from androgen deprivation therapy and I will leave that to Dr. Cherrie Gauze office. Patient is to call with any concerns or questions. He will follow-up with Dr. Erlene Quan.  I would like to take this opportunity to thank you for allowing me to participate in the care of your patient.Noreene Filbert, MD

## 2018-01-05 ENCOUNTER — Encounter: Payer: Self-pay | Admitting: Urology

## 2018-01-08 ENCOUNTER — Telehealth: Payer: Self-pay | Admitting: Urology

## 2018-01-08 DIAGNOSIS — C61 Malignant neoplasm of prostate: Secondary | ICD-10-CM

## 2018-01-08 NOTE — Telephone Encounter (Signed)
Patient left a my chart message on Friday.  Call return, after consultation with radiation oncology, he is elected to pursue radical robotic prostatectomy with bilateral pelvic lymph node dissection.  All additional questions were answered today.    Will arrange for physical therapy referral preop.  Will notify surgical scheduler.  Hollice Espy, MD

## 2018-01-08 NOTE — Telephone Encounter (Signed)
LMOM to return call. Need to discuss robotic radical prostatectomy with bilateral pelvic lymph node dissection.

## 2018-01-09 NOTE — Telephone Encounter (Signed)
Patient would like to postpone surgery until December 2019. Will call back with surgery date when available. Questions answered. Patient voices understanding.

## 2018-01-10 ENCOUNTER — Other Ambulatory Visit: Payer: Self-pay | Admitting: Radiology

## 2018-01-10 DIAGNOSIS — C61 Malignant neoplasm of prostate: Secondary | ICD-10-CM

## 2018-01-15 ENCOUNTER — Other Ambulatory Visit: Payer: Self-pay | Admitting: Radiology

## 2018-01-15 DIAGNOSIS — C61 Malignant neoplasm of prostate: Secondary | ICD-10-CM

## 2018-01-19 ENCOUNTER — Other Ambulatory Visit: Payer: Self-pay | Admitting: Family Medicine

## 2018-01-19 ENCOUNTER — Encounter: Payer: Self-pay | Admitting: Urology

## 2018-01-19 MED ORDER — METFORMIN HCL ER 500 MG PO TB24: 500.0000 mg | ORAL_TABLET | Freq: Every day | ORAL | 3 refills | Status: DC

## 2018-01-23 ENCOUNTER — Ambulatory Visit: Payer: 59 | Attending: Urology | Admitting: Physical Therapy

## 2018-01-23 ENCOUNTER — Encounter: Payer: Self-pay | Admitting: Physical Therapy

## 2018-01-23 ENCOUNTER — Other Ambulatory Visit: Payer: Self-pay

## 2018-01-23 DIAGNOSIS — G8929 Other chronic pain: Secondary | ICD-10-CM | POA: Diagnosis present

## 2018-01-23 DIAGNOSIS — R278 Other lack of coordination: Secondary | ICD-10-CM | POA: Diagnosis not present

## 2018-01-23 DIAGNOSIS — M6281 Muscle weakness (generalized): Secondary | ICD-10-CM

## 2018-01-23 DIAGNOSIS — M545 Low back pain: Secondary | ICD-10-CM | POA: Insufficient documentation

## 2018-01-23 DIAGNOSIS — M542 Cervicalgia: Secondary | ICD-10-CM

## 2018-01-23 NOTE — Patient Instructions (Addendum)
    60 fl oz bladder irritant: 36 fl oz of water   This week decrease bladder irritant :  Increase water  ________  Stretches : (Cuing provided for proper alignment)  Every 1 hours   6 directions of spine: Sidebend,  Midback rotation with arm swings ( without moving hips and knees)   Mini squat, knees bents, hands interlaced, pull hands down,  Shoulder back and chest lift   ______  PELVIC FLOOR / KEGEL EXERCISES   Pelvic floor/ Kegel exercises are used to strengthen the muscles in the base of your pelvis that are responsible for supporting your pelvic organs and preventing urine/feces leakage. Based on your therapist's recommendations, they can be performed while standing, sitting, or lying down.  Make yourself aware of this muscle group by using these cues:  Imagine you are in a crowded room and you feel the need to pass gas. Your response is to pull up and in at the rectum.  Close the rectum. Pull the muscles up inside your body,feeling your scrotum lifting as well . Feel the pelvic floor muscles lift as if you were walking into a cold lake.  Place your hand on top of your pubic bone. Tighten and draw in the muscles around the anal muscles without squeezing the buttock muscles.  Common Errors:  Breath holding: If you are holding your breath, you may be bearing down against your bladder instead of pulling it up. If you belly bulges up while you are squeezing, you are holding your breath. Be sure to breathe gently in and out while exercising. Counting out loud may help you avoid holding your breath.  Accessory muscle use: You should not see or feel other muscle movement when performing pelvic floor exercises. When done properly, no one can tell that you are performing the exercises. Keep the buttocks, belly and inner thighs relaxed.  Overdoing it: Your muscles can fatigue and stop working for you if you over-exercise. You may actually leak more or feel soreness at the lower  abdomen or rectum.  YOUR HOME EXERCISE PROGRAM  LONG HOLDS: Position: on back, reclined in car   Inhale and then exhale. Then squeeze the muscle and count aloud for 5 seconds. Rest with three long breaths. (Be sure to let belly sink in with exhales and not push outward)  Perform 5 repetitions, 3 times/day    **ALSO SQUEEZE BEFORE YOUR SNEEZE, COUGH, LAUGH to decrease downward pressure   **ALSO EXHALE BEFORE YOU RISE AGAINST GRAVITY (lifting, sit to stand, from squat to stand)

## 2018-01-24 ENCOUNTER — Ambulatory Visit: Payer: 59 | Admitting: Physical Therapy

## 2018-01-24 DIAGNOSIS — R278 Other lack of coordination: Secondary | ICD-10-CM

## 2018-01-24 DIAGNOSIS — M6281 Muscle weakness (generalized): Secondary | ICD-10-CM

## 2018-01-24 DIAGNOSIS — M545 Low back pain, unspecified: Secondary | ICD-10-CM

## 2018-01-24 DIAGNOSIS — M542 Cervicalgia: Secondary | ICD-10-CM

## 2018-01-24 DIAGNOSIS — G8929 Other chronic pain: Secondary | ICD-10-CM

## 2018-01-24 NOTE — Patient Instructions (Addendum)
Mid back releases to improve neck  2x day     1) Open book ( handout)  2)  "W" exercise  15 reps  Band is placed under feet, knees bent, feet are hip width apart Hold band with thumbs point out, keep upper arm and elbow touching the bed the whole time  - inhale and then exhale pull bands by bending elbows hands move in a "w"  (feel shoulder blades squeezing)   ___  Wall squats with pressing elbows and shoulders and chin tuck with 5 sec pelvic floor squeezes, 5 reps  Every hour add to stretches    ___  Ergonomic ( handout)

## 2018-01-25 NOTE — Therapy (Addendum)
[place] MAIN [facility] SERVICES [address] [place], [place], [address] Phone: [phone]   Fax:  [phone]  Physical Therapy Evaluation  Patient Details  Name: Dennis Hicks MRN: [id number] Date of Birth: [birth year] Referring Provider (PT): Joanette Gula Date: 01/23/2018    Past Medical History:  Diagnosis Date  . Borderline diabetes   . Bronchitis   . Diabetes mellitus without complication ([place])   . Headache   . Heartburn   . HLD (hyperlipidemia)   . Hypertension   . Melanoma ([place]) 2018   Right chest area resected  . Pneumonia    x2 Aug. 2017  . Psoriasis   . Renal insufficiency     Past Surgical History:  Procedure Laterality Date  . FINGER GANGLION CYST EXCISION      There were no vitals filed for this visit.   Subjective Assessment - 02/18/18 1222    Subjective 1) prostate cancer:  A year ago in August after pt had a biopsy for prostate, pt experienced leakage. Pt started to tighten his muscles to prevent leakage when coughing. This technique helped prevent for the most part except when he forgets to tighten his muscle.  Pt noticed sometimes when he was sitting on the couch, pt noticed his urine moved.  It was aggravating. 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 Pt has no physical routines the past 2 years due to busy schedule. Pt used to do the elliptical 40 min. No stretches. Pt used to do crunches when he was younger. Fell onto his tailbone 40 years ago without treatment.    2) R LBP bothers him randomly3-7/10 with a "catch".  Pain is relieved with hydrocodone or shifting positions.  No injury.  Regular bowel movemetns with some straining.  Daily fluid intake:  12 - 24 fl oz of coffee, 24 -36 fl oz of water, 24-36 fl oz tea. rarely alcohol.    3) Migraines/  chronic neck pain for 20 years from playing football and working desk job for 25 years.   Migraines 9/10 occur in the back of neck and to  R shoulders , vary between week to months.  Pt takes migraine medicine.            [facility] PT Assessment - 02/18/18 1223      Assessment   Medical Diagnosis  Prostate Cancer    Referring Provider (PT)  Erlene Quan      Precautions   Precautions  None      Restrictions   Weight Bearing Restrictions  No      Balance Screen   Has the patient fallen in the past 6 months  No      Observation/Other Assessments   Observations  forward head posture , increased thoracic kyphosis       Strength   Overall Strength  --   hip ext 4-/5 B ( pre Tx), $+/5 ( post Tx)      Palpation   Spinal mobility  limited thoracic mobility     SI assessment   L PSIS more posterior ( post Tx: aligned)                  Objective measurements completed on examination: See above findings.      [facility] Adult PT Treatment/Exercise - 02/18/18 1225      Bed Mobility   Bed Mobility  --   crunch      Neuro Re-ed  Neuro Re-ed Details   see pt instructions                    PT Long Term Goals - 02/18/18 1225      PT LONG TERM GOAL #1   Title  Migraines will decrease by 50% across one week in order to demo IND with neck/shoulder HEP    Time  4    Period  Weeks    Status  New    Target Date  03/18/18      PT LONG TERM GOAL #2   Title  Pt will decrease his NDI 14% to < 7% in order to improve QOL    Time  2    Period  Weeks    Status  New    Target Date  03/04/18      PT LONG TERM GOAL #3   Title  Pt will demo proper technique to not bear down on his pelvic floor and abdominal mm and proper pelvic floor exercises to minimize incontinence s/p prostatectomy    Time  4    Period  Weeks    Status  New    Target Date  03/18/18             Plan - 02/18/18 1230    Clinical Impression Statement  Pt is a 62  yo male who is scheduled for prostatectomy on 03/19/17. Pt was referred to Pelvic Health PT to train his pelvic floor mm to yield better outcomes with continence  post-surgery. Pt also reported R LBP and neck pain/ migraines.  Pt 's clinical presentations included  signs of poor intraabdominal pressure system which is associated increased risk for urinary incontinence: _poor posture, forward head,  _dyscoordination of deep core mm _lack of understanding on exercises that places less strain on the abdomen/pelvic floor.   Pt was provided education on etiology of Sx with anatomy, physiology explanation with images along with the benefits of customized pelvic PT Tx based on pt's medical conditions and musculoskeletal deficits.        Rehab Potential  Good    PT Frequency  2x / week    PT Duration  4 weeks    PT Treatment/Interventions  Aquatic Therapy;Moist Heat;Gait training;Therapeutic exercise;Therapeutic activities;Neuromuscular re-education;Patient/family education;Manual techniques;Biofeedback;Electrical Stimulation;Manual lymph drainage;Energy conservation;Scar mobilization;Passive range of motion;Functional mobility training;Stair training;Joint Manipulations    Consulted and Agree with Plan of Care  Patient       Patient will benefit from skilled therapeutic intervention in order to improve the following deficits and impairments:  Improper body mechanics, Decreased range of motion, Decreased coordination, Postural dysfunction, Pain, Increased muscle spasms, Decreased endurance  Visit Diagnosis: Chronic midline low back pain without sciatica  Other lack of coordination  Muscle weakness (generalized)  Neck pain     Problem List Patient Active Problem List   Diagnosis Date Noted  . Lateral epicondylitis of right elbow 03/03/2017  . Idiopathic hematuria 04/26/2016  . Chronic right shoulder pain 11/29/2013  . CKD stage 3 due to type 2 diabetes mellitus ([place]) 11/29/2013  . Diabetes mellitus due to underlying condition with renal complication ([place]) 15/17/6160  . Right knee pain 01/08/2013  . Routine general medical examination at a  health care facility 10/13/2010  . PROSTATE SPECIFIC ANTIGEN, ELEVATED 09/25/2009  . HYPERCHOLESTEROLEMIA 09/23/2009  . MIGRAINE, COMMON 09/23/2009  . Benign hypertension 09/23/2009  . GERD 09/23/2009  . ERECTILE DYSFUNCTION, ORGANIC 09/23/2009  . DERMATITIS, ATOPIC 09/23/2009  . Chronic low  back pain 09/23/2009    Jerl Mina ,PT, DPT, E-RYT  02/18/2018, 12:30 PM  [place] MAIN [facility] SERVICES [address] [place], [place], [address] Phone: [phone]   Fax:  [phone]  Name: Dennis Hicks MRN: [id number] Date of Birth: [birth year]

## 2018-01-26 ENCOUNTER — Encounter: Payer: Self-pay | Admitting: Family Medicine

## 2018-01-26 ENCOUNTER — Ambulatory Visit: Payer: 59 | Admitting: Family Medicine

## 2018-01-26 VITALS — BP 122/76 | HR 93 | Temp 98.4°F | Ht 67.0 in | Wt 218.0 lb

## 2018-01-26 DIAGNOSIS — B9789 Other viral agents as the cause of diseases classified elsewhere: Secondary | ICD-10-CM

## 2018-01-26 DIAGNOSIS — J069 Acute upper respiratory infection, unspecified: Secondary | ICD-10-CM

## 2018-01-26 MED ORDER — HYDROCODONE-HOMATROPINE 5-1.5 MG/5ML PO SYRP: 5.0000 mL | ORAL_SOLUTION | Freq: Three times a day (TID) | ORAL | 0 refills | Status: DC | PRN

## 2018-01-26 NOTE — Patient Instructions (Signed)
For nasal drainage and allergy symptoms, you can take an over the counter long acting antihistamine like Zyrtec or Claritin  Take Mucinex to thin secretions  Use tylenol for fever or pain  If not improved in 5-7 days, please follow up   Upper Respiratory Infection, Adult Most upper respiratory infections (URIs) are caused by a virus. A URI affects the nose, throat, and upper air passages. The most common type of URI is often called "the common cold." Follow these instructions at home:  Take medicines only as told by your doctor.  Gargle warm saltwater or take cough drops to comfort your throat as told by your doctor.  Use a warm mist humidifier or inhale steam from a shower to increase air moisture. This may make it easier to breathe.  Drink enough fluid to keep your pee (urine) clear or pale yellow.  Eat soups and other clear broths.  Have a healthy diet.  Rest as needed.  Go back to work when your fever is gone or your doctor says it is okay. ? You may need to stay home longer to avoid giving your URI to others. ? You can also wear a face mask and wash your hands often to prevent spread of the virus.  Use your inhaler more if you have asthma.  Do not use any tobacco products, including cigarettes, chewing tobacco, or electronic cigarettes. If you need help quitting, ask your doctor. Contact a doctor if:  You are getting worse, not better.  Your symptoms are not helped by medicine.  You have chills.  You are getting more short of breath.  You have brown or red mucus.  You have yellow or brown discharge from your nose.  You have pain in your face, especially when you bend forward.  You have a fever.  You have puffy (swollen) neck glands.  You have pain while swallowing.  You have white areas in the back of your throat. Get help right away if:  You have very bad or constant: ? Headache. ? Ear pain. ? Pain in your forehead, behind your eyes, and over your  cheekbones (sinus pain). ? Chest pain.  You have long-lasting (chronic) lung disease and any of the following: ? Wheezing. ? Long-lasting cough. ? Coughing up blood. ? A change in your usual mucus.  You have a stiff neck.  You have changes in your: ? Vision. ? Hearing. ? Thinking. ? Mood. This information is not intended to replace advice given to you by your health care provider. Make sure you discuss any questions you have with your health care provider. Document Released: 08/24/2007 Document Revised: 11/08/2015 Document Reviewed: 06/12/2013 Elsevier Interactive Patient Education  2018 [employer].

## 2018-01-26 NOTE — Progress Notes (Signed)
   Subjective:    Patient ID: Dennis Hicks, male    DOB: January 25, 1956, 62 y.o.   MRN: [id number]  HPI This is a 62 yo male who presents today with sinus symptoms x 3 days. Sore throat, dry cough. Temp up to 99.2. Taking acetaminophen, aspirin and a liquid cough medicine that he is not sure of name. No SOB, no wheeze, no history of asthma or smoking. Has history of seasonal allergies, does not take any medication. No sick contacts.   Past Medical History:  Diagnosis Date  . Borderline diabetes   . Diabetes mellitus without complication ([place])   . Heartburn   . HLD (hyperlipidemia)   . Hypertension   . Melanoma ([place]) 2018   Right chest area resected  . Psoriasis    No past surgical history on file. Family History  Problem Relation Age of Onset  . Hypertension Mother   . Coronary artery disease Father   . Hypertension Father   . Hyperlipidemia Father   . Diabetes Father   . Prostate cancer Neg Hx   . Kidney cancer Neg Hx   . Bladder Cancer Neg Hx    Social History   Tobacco Use  . Smoking status: Never Smoker  . Smokeless tobacco: Never Used  Substance Use Topics  . Alcohol use: Yes  . Drug use: No      Review of Systems Per HPI    Objective:   Physical Exam  Constitutional: He is oriented to person, place, and time. He appears well-developed and well-nourished. No distress.  HENT:  Head: Normocephalic and atraumatic.  Right Ear: External ear normal.  Left Ear: External ear normal.  Nose: Mucosal edema present.  Mouth/Throat: Uvula is midline and oropharynx is clear and moist.  Hoarse voice.   Eyes: Conjunctivae are normal. Right eye exhibits no discharge. Left eye exhibits no discharge.  Neck: Normal range of motion. Neck supple.  Cardiovascular: Normal rate, regular rhythm and normal heart sounds.  Pulmonary/Chest: Effort normal and breath sounds normal. No stridor. No respiratory distress. He has no wheezes. He has no rales.  Lymphadenopathy:    He has no  cervical adenopathy.  Neurological: He is alert and oriented to person, place, and time.  Skin: Skin is warm and dry. He is not diaphoretic.  Psychiatric: He has a normal mood and affect. His behavior is normal. Judgment and thought content normal.  Vitals reviewed.     BP 122/76 (BP Location: Left Arm, Patient Position: Sitting, Cuff Size: Large)   Pulse 93   Temp 98.4 F (36.9 C) (Oral)   Ht 5\' 7"  (1.702 m)   Wt 218 lb (98.9 kg)   SpO2 97%   BMI 34.14 kg/m  Wt Readings from Last 3 Encounters:  01/26/18 218 lb (98.9 kg)  01/04/18 215 lb 2.7 oz (97.6 kg)  12/26/17 215 lb (97.5 kg)       Assessment & Plan:  1. Viral URI with cough -Provided written and verbal information regarding diagnosis and treatment. - discussed symptomatic treatment and return to clinic precautions - HYDROcodone-homatropine (HYCODAN) 5-1.5 MG/5ML syrup; Take 5 mLs by mouth every 8 (eight) hours as needed for cough.  Dispense: 120 mL; Refill: 0   Clarene Reamer, FNP-BC  [facility]  01/26/2018 5:33 PM

## 2018-01-26 NOTE — Therapy (Addendum)
[place] MAIN [facility] SERVICES [address] [place], [place], [address] Phone: [phone]   Fax:  [phone]  Physical Therapy Treatment  Patient Details  Name: Dennis Hicks MRN: [id number] Date of Birth: 05/08/1955 Referring Provider (PT): Erlene Quan   Encounter Date: 01/24/2018  PT End of Session - 02/18/18 1237    Visit Number  2    Number of Visits  4       Past Medical History:  Diagnosis Date  . Borderline diabetes   . Bronchitis   . Diabetes mellitus without complication ([place])   . Headache   . Heartburn   . HLD (hyperlipidemia)   . Hypertension   . Melanoma ([place]) 2018   Right chest area resected  . Pneumonia    x2 Aug. 2017  . Psoriasis   . Renal insufficiency     Past Surgical History:  Procedure Laterality Date  . FINGER GANGLION CYST EXCISION      There were no vitals filed for this visit.  Subjective Assessment - 02/18/18 1236    Subjective  Pt would like to manage his migraines          Assessment: Cervical spine 140 deg dowagers hump  35 deg ext, 40 deg  35 deg  side flexion 40 deg R, 50 deg  Rotation   Tx:   STM along upper trap/levator scapula B, midback , PA mob Grade III along lower cervical/upper trap                           PT Long Term Goals - 02/18/18 1225      PT LONG TERM GOAL #1   Title  Migraines will decrease by 50% across one week in order to demo IND with neck/shoulder HEP    Time  4    Period  Weeks    Status  New    Target Date  03/18/18      PT LONG TERM GOAL #2   Title  Pt will decrease his NDI 14% to < 7% in order to improve QOL    Time  2    Period  Weeks    Status  New    Target Date  03/04/18      PT LONG TERM GOAL #3   Title  Pt will demo proper technique to not bear down on his pelvic floor and abdominal mm and proper pelvic floor exercises to minimize incontinence s/p prostatectomy    Time  4    Period  Weeks    Status  New    Target Date  03/18/18            Plan - 02/18/18 1237    Clinical Impression Statement Pt demo'd decreased tightness of neck and shoulder mm, and less forward head posture post Tx. Pt demo'd proper technique with new HEP.  Pt demo'd good sitting posture and carry over with pelvic floor exercises from last session.    Rehab Potential  Good    PT Frequency  2x / week    PT Duration  4 weeks    PT Treatment/Interventions  Aquatic Therapy;Moist Heat;Gait training;Therapeutic exercise;Therapeutic activities;Neuromuscular re-education;Patient/family education;Manual techniques;Biofeedback;Electrical Stimulation;Manual lymph drainage;Energy conservation;Scar mobilization;Passive range of motion;Functional mobility training;Stair training;Joint Manipulations    Consulted and Agree with Plan of Care  Patient       Patient will benefit from skilled therapeutic intervention in order to improve the following deficits  and impairments:  Improper body mechanics, Decreased range of motion, Decreased coordination, Postural dysfunction, Pain, Increased muscle spasms, Decreased endurance  Visit Diagnosis: Chronic midline low back pain without sciatica  Other lack of coordination  Muscle weakness (generalized)  Neck pain     Problem List Patient Active Problem List   Diagnosis Date Noted  . Lateral epicondylitis of right elbow 03/03/2017  . Idiopathic hematuria 04/26/2016  . Chronic right shoulder pain 11/29/2013  . CKD stage 3 due to type 2 diabetes mellitus ([place]) 11/29/2013  . Diabetes mellitus due to underlying condition with renal complication ([place]) 02/04/3566  . Right knee pain 01/08/2013  . Routine general medical examination at a health care facility 10/13/2010  . PROSTATE SPECIFIC ANTIGEN, ELEVATED 09/25/2009  . HYPERCHOLESTEROLEMIA 09/23/2009  . MIGRAINE, COMMON 09/23/2009  . Benign hypertension 09/23/2009  . GERD 09/23/2009  . ERECTILE DYSFUNCTION, ORGANIC 09/23/2009  .  DERMATITIS, ATOPIC 09/23/2009  . Chronic low back pain 09/23/2009    Jerl Mina ,PT, DPT, E-RYT  02/18/2018, 12:37 PM  [place] MAIN [facility] SERVICES [address] [place], [place], [address] Phone: [phone]   Fax:  [phone]  Name: Dennis Hicks MRN: [id number] Date of Birth: Aug 02, 1955

## 2018-02-08 ENCOUNTER — Other Ambulatory Visit: Payer: Self-pay

## 2018-02-08 ENCOUNTER — Encounter
Admission: RE | Admit: 2018-02-08 | Discharge: 2018-02-08 | Disposition: A | Discharge status: Home / Self Care | Payer: 59 | Source: Ambulatory Visit | Attending: Urology | Admitting: Urology

## 2018-02-08 DIAGNOSIS — Z01818 Encounter for other preprocedural examination: Secondary | ICD-10-CM | POA: Diagnosis present

## 2018-02-08 DIAGNOSIS — I10 Essential (primary) hypertension: Secondary | ICD-10-CM

## 2018-02-08 HISTORY — DX: Disorder of kidney and ureter, unspecified: N28.9

## 2018-02-08 HISTORY — DX: Headache: R51

## 2018-02-08 HISTORY — DX: Bronchitis, not specified as acute or chronic: J40

## 2018-02-08 HISTORY — DX: Headache, unspecified: R51.9

## 2018-02-08 HISTORY — DX: Pneumonia, unspecified organism: J18.9

## 2018-02-08 LAB — BASIC METABOLIC PANEL
Anion gap: 11 (ref 5–15)
BUN: 15 mg/dL (ref 8–23)
CALCIUM: 10.1 mg/dL (ref 8.9–10.3)
CO2: 28 mmol/L (ref 22–32)
Chloride: 98 mmol/L (ref 98–111)
Creatinine, Ser: 1.22 mg/dL (ref 0.61–1.24)
GFR calc Af Amer: >60 mL/min (ref >60)
GFR calc non Af Amer: >60 mL/min (ref >60)
GLUCOSE: 160 mg/dL — AB (ref 70–99)
Potassium: 3.5 mmol/L (ref 3.5–5.1)
Sodium: 137 mmol/L (ref 135–145)

## 2018-02-08 LAB — CBC
HEMATOCRIT: 46.4 % (ref 39.0–52.0)
Hemoglobin: 15.4 g/dL (ref 13.0–17.0)
MCH: 29.3 pg (ref 26.0–34.0)
MCHC: 33.2 g/dL (ref 30.0–36.0)
MCV: 88.2 fL (ref 80.0–100.0)
Platelets: 300 10*3/uL (ref 150–400)
RBC: 5.26 MIL/uL (ref 4.22–5.81)
RDW: 12.3 % (ref 11.5–15.5)
WBC: 7.6 10*3/uL (ref 4.0–10.5)
nRBC: 0 % (ref 0.0–0.2)

## 2018-02-08 LAB — URINALYSIS, ROUTINE W REFLEX MICROSCOPIC
Bacteria, UA: NONE SEEN
Bilirubin Urine: NEGATIVE
GLUCOSE, UA: 50 mg/dL — AB
HGB URINE DIPSTICK: NEGATIVE
KETONES UR: NEGATIVE mg/dL
Nitrite: NEGATIVE
PROTEIN: NEGATIVE mg/dL
Specific Gravity, Urine: 1.005 (ref 1.005–1.030)
Squamous Epithelial / LPF: NONE SEEN (ref 0–5)
pH: 8 (ref 5.0–8.0)

## 2018-02-08 LAB — PROTIME-INR
INR: 0.95
Prothrombin Time: 12.6 seconds (ref 11.4–15.2)

## 2018-02-08 LAB — TYPE AND SCREEN
ABO/RH(D): O POS
Antibody Screen: NEGATIVE

## 2018-02-08 NOTE — Patient Instructions (Addendum)
Your procedure is scheduled on: 02/19/18 Mon Report to Same Day Surgery 2nd floor medical mall [facility] Entrance-take elevator on left to 2nd floor.  Check in with surgery information desk.) To find out your arrival time please call [phone] between 1PM - 3PM on 02/16/18 Fri  Remember: Instructions that are not followed completely may result in serious medical risk, up to and including death, or upon the discretion of your surgeon and anesthesiologist your surgery may need to be rescheduled.    _x___ 1. Do not eat food after midnight the night before your procedure. You may drink clear liquids up to 2 hours before you are scheduled to arrive at the hospital for your procedure.  Do not drink clear liquids within 2 hours of your scheduled arrival to the hospital.  Clear liquids include  --Water or Apple juice without pulp  --Clear carbohydrate beverage such as ClearFast or Gatorade  --Black Coffee or Clear Tea (No milk, no creamers, do not add anything to                  the coffee or Tea Type 1 and type 2 diabetics should only drink water.   ____Ensure clear carbohydrate drink on the way to the hospital for bariatric patients  ____Ensure clear carbohydrate drink 3 hours before surgery for Dr Dwyane Luo patients if physician instructed.   No gum chewing or hard candies.     __x__ 2. No Alcohol for 24 hours before or after surgery.   __x__3. No Smoking or e-cigarettes for 24 prior to surgery.  Do not use any chewable tobacco products for at least 6 hour prior to surgery   ____  4. Bring all medications with you on the day of surgery if instructed.    __x__ 5. Notify your doctor if there is any change in your medical condition     (cold, fever, infections).    x___6. On the morning of surgery brush your teeth with toothpaste and water.  You may rinse your mouth with mouth wash if you wish.  Do not swallow any toothpaste or mouthwash.   Do not wear jewelry, make-up, hairpins,  clips or nail polish.  Do not wear lotions, powders, or perfumes. You may wear deodorant.  Do not shave 48 hours prior to surgery. Men may shave face and neck.  Do not bring valuables to the hospital.    [facility] is not responsible for any belongings or valuables.               Contacts, dentures or bridgework may not be worn into surgery.  Leave your suitcase in the car. After surgery it may be brought to your room.  For patients admitted to the hospital, discharge time is determined by your                       treatment team.  _  Patients discharged the day of surgery will not be allowed to drive home.  You will need someone to drive you home and stay with you the night of your procedure.    Please read over the following fact sheets that you were given:   [facility] Preparing for Surgery and or MRSA Information   _x___ Take anti-hypertensive listed below, cardiac, seizure, asthma,     anti-reflux and psychiatric medicines. These include:  1. famotidine (PEPCID AC) 10 MG chewable tablet the night before and the morning of surgery  2.simvastatin (ZOCOR) 40  MG tablet  3.  4.  5.  6.  ____Fleets enema or Magnesium Citrate as directed.   _x___ Use CHG Soap or sage wipes as directed on instruction sheet   ____ Use inhalers on the day of surgery and bring to hospital day of surgery  ____ Stop Metformin and Janumet 2 days prior to surgery.    ____ Take 1/2 of usual insulin dose the night before surgery and none on the morning     surgery.   _x___ Follow recommendations from Cardiologist, Pulmonologist or PCP regarding          stopping Aspirin, Coumadin, Plavix ,Eliquis, Effient, or Pradaxa, and Pletal.  X____Stop Anti-inflammatories such as Advil, Aleve, Ibuprofen, Motrin, Naproxen, Naprosyn, Goodies powders or aspirin products. OK to take Tylenol and                          Celebrex.   _x___ Stop supplements until after surgery.  But may continue Vitamin D, Vitamin B,        and multivitamin.   ____ Bring C-Pap to the hospital.

## 2018-02-09 LAB — URINE CULTURE: CULTURE: NO GROWTH

## 2018-02-18 NOTE — Addendum Note (Signed)
Addended by: Jerl Mina on: 02/18/2018 12:36 PM   Modules accepted: Orders

## 2018-02-19 ENCOUNTER — Ambulatory Visit: Payer: 59 | Admitting: Certified Registered"

## 2018-02-19 ENCOUNTER — Ambulatory Visit: Payer: 59

## 2018-02-19 ENCOUNTER — Encounter
Admission: RE | Disposition: A | Discharge status: Home / Self Care | Payer: Self-pay | Source: Ambulatory Visit | Attending: Urology

## 2018-02-19 ENCOUNTER — Other Ambulatory Visit: Payer: Self-pay

## 2018-02-19 ENCOUNTER — Observation Stay
Admission: RE | Admit: 2018-02-19 | Discharge: 2018-02-21 | Disposition: A | Discharge status: Home / Self Care | Payer: 59 | Source: Ambulatory Visit | Attending: Urology | Admitting: Urology

## 2018-02-19 DIAGNOSIS — E1122 Type 2 diabetes mellitus with diabetic chronic kidney disease: Secondary | ICD-10-CM | POA: Diagnosis not present

## 2018-02-19 DIAGNOSIS — I129 Hypertensive chronic kidney disease with stage 1 through stage 4 chronic kidney disease, or unspecified chronic kidney disease: Secondary | ICD-10-CM | POA: Diagnosis not present

## 2018-02-19 DIAGNOSIS — N183 Chronic kidney disease, stage 3 (moderate): Secondary | ICD-10-CM | POA: Diagnosis not present

## 2018-02-19 DIAGNOSIS — C61 Malignant neoplasm of prostate: Secondary | ICD-10-CM

## 2018-02-19 DIAGNOSIS — E785 Hyperlipidemia, unspecified: Secondary | ICD-10-CM | POA: Diagnosis not present

## 2018-02-19 DIAGNOSIS — Z0389 Encounter for observation for other suspected diseases and conditions ruled out: Secondary | ICD-10-CM | POA: Diagnosis not present

## 2018-02-19 DIAGNOSIS — Z79899 Other long term (current) drug therapy: Secondary | ICD-10-CM | POA: Insufficient documentation

## 2018-02-19 DIAGNOSIS — Z8546 Personal history of malignant neoplasm of prostate: Secondary | ICD-10-CM | POA: Diagnosis present

## 2018-02-19 DIAGNOSIS — Z419 Encounter for procedure for purposes other than remedying health state, unspecified: Secondary | ICD-10-CM

## 2018-02-19 HISTORY — PX: PELVIC LYMPH NODE DISSECTION: SHX6543

## 2018-02-19 HISTORY — PX: ROBOT ASSISTED LAPAROSCOPIC RADICAL PROSTATECTOMY: SHX5141

## 2018-02-19 LAB — GLUCOSE, CAPILLARY
GLUCOSE-CAPILLARY: 130 mg/dL — AB (ref 70–99)
Glucose-Capillary: 153 mg/dL — ABNORMAL HIGH (ref 70–99)
Glucose-Capillary: 208 mg/dL — ABNORMAL HIGH (ref 70–99)

## 2018-02-19 LAB — ABO/RH: ABO/RH(D): O POS

## 2018-02-19 SURGERY — ROBOTIC ASSISTED LAPAROSCOPIC RADICAL PROSTATECTOMY
Anesthesia: General | Site: Pelvis

## 2018-02-19 MED ORDER — ROCURONIUM BROMIDE 100 MG/10ML IV SOLN
INTRAVENOUS | Status: DC | PRN
  Administered 2018-02-19: 20 mg via INTRAVENOUS
  Administered 2018-02-19: 10 mg via INTRAVENOUS
  Administered 2018-02-19 (×2): 20 mg via INTRAVENOUS
  Administered 2018-02-19: 50 mg via INTRAVENOUS

## 2018-02-19 MED ORDER — INSULIN ASPART 100 UNIT/ML ~~LOC~~ SOLN
4.0000 [IU] | Freq: Three times a day (TID) | SUBCUTANEOUS | Status: DC
  Administered 2018-02-20: 4 [IU] via SUBCUTANEOUS
  Filled 2018-02-19 (×2): qty 1

## 2018-02-19 MED ORDER — ACETAMINOPHEN 10 MG/ML IV SOLN
INTRAVENOUS | Status: AC
  Filled 2018-02-19: qty 100

## 2018-02-19 MED ORDER — ACETAMINOPHEN 325 MG PO TABS
650.0000 mg | ORAL_TABLET | ORAL | Status: DC | PRN
  Administered 2018-02-21: 650 mg via ORAL
  Filled 2018-02-19: qty 2

## 2018-02-19 MED ORDER — HYDROCHLOROTHIAZIDE 25 MG PO TABS
25.0000 mg | ORAL_TABLET | Freq: Every day | ORAL | Status: DC
  Administered 2018-02-20 – 2018-02-21 (×2): 25 mg via ORAL
  Filled 2018-02-19 (×2): qty 1

## 2018-02-19 MED ORDER — ASPIRIN EC 81 MG PO TBEC: 324.0000 mg | DELAYED_RELEASE_TABLET | Freq: Every day | ORAL | Status: DC | PRN

## 2018-02-19 MED ORDER — FENTANYL CITRATE (PF) 100 MCG/2ML IJ SOLN
INTRAMUSCULAR | Status: AC
  Filled 2018-02-19: qty 2

## 2018-02-19 MED ORDER — ONDANSETRON HCL 4 MG/2ML IJ SOLN
INTRAMUSCULAR | Status: DC | PRN
  Administered 2018-02-19: 4 mg via INTRAVENOUS

## 2018-02-19 MED ORDER — SODIUM CHLORIDE 0.9 % IV SOLN
INTRAVENOUS | Status: DC
  Administered 2018-02-19: 12:00:00 via INTRAVENOUS

## 2018-02-19 MED ORDER — PHENYLEPHRINE HCL 10 MG/ML IJ SOLN
INTRAMUSCULAR | Status: DC | PRN
  Administered 2018-02-19 (×3): 100 ug via INTRAVENOUS

## 2018-02-19 MED ORDER — BENAZEPRIL HCL 20 MG PO TABS
20.0000 mg | ORAL_TABLET | Freq: Every day | ORAL | Status: DC
  Administered 2018-02-20 – 2018-02-21 (×2): 20 mg via ORAL
  Filled 2018-02-19 (×3): qty 1

## 2018-02-19 MED ORDER — PROPOFOL 10 MG/ML IV BOLUS
INTRAVENOUS | Status: DC | PRN
  Administered 2018-02-19: 200 mg via INTRAVENOUS

## 2018-02-19 MED ORDER — FENTANYL CITRATE (PF) 100 MCG/2ML IJ SOLN
INTRAMUSCULAR | Status: DC | PRN
  Administered 2018-02-19: 50 ug via INTRAVENOUS
  Administered 2018-02-19: 100 ug via INTRAVENOUS
  Administered 2018-02-19 (×4): 50 ug via INTRAVENOUS

## 2018-02-19 MED ORDER — SUGAMMADEX SODIUM 200 MG/2ML IV SOLN
INTRAVENOUS | Status: AC
  Filled 2018-02-19: qty 2

## 2018-02-19 MED ORDER — CEFAZOLIN SODIUM 1 G IJ SOLR
INTRAMUSCULAR | Status: AC
  Filled 2018-02-19: qty 20

## 2018-02-19 MED ORDER — MIDAZOLAM HCL 2 MG/2ML IJ SOLN
INTRAMUSCULAR | Status: AC
  Filled 2018-02-19: qty 2

## 2018-02-19 MED ORDER — ONDANSETRON HCL 4 MG/2ML IJ SOLN
INTRAMUSCULAR | Status: AC
  Filled 2018-02-19: qty 2

## 2018-02-19 MED ORDER — MORPHINE SULFATE (PF) 2 MG/ML IV SOLN
2.0000 mg | INTRAVENOUS | Status: DC | PRN
  Administered 2018-02-19 (×2): 2 mg via INTRAVENOUS
  Administered 2018-02-20 (×2): 4 mg via INTRAVENOUS
  Filled 2018-02-19: qty 1
  Filled 2018-02-19: qty 2
  Filled 2018-02-19: qty 1
  Filled 2018-02-19: qty 2

## 2018-02-19 MED ORDER — BENAZEPRIL-HYDROCHLOROTHIAZIDE 20-25 MG PO TABS: 1.0000 | ORAL_TABLET | Freq: Every day | ORAL | Status: DC

## 2018-02-19 MED ORDER — THROMBIN 5000 UNITS EX SOLR
CUTANEOUS | Status: DC | PRN
  Administered 2018-02-19: 5000 [IU] via TOPICAL

## 2018-02-19 MED ORDER — ONDANSETRON HCL 4 MG/2ML IJ SOLN
4.0000 mg | INTRAMUSCULAR | Status: DC | PRN
  Administered 2018-02-19 – 2018-02-20 (×2): 4 mg via INTRAVENOUS
  Filled 2018-02-19 (×2): qty 2

## 2018-02-19 MED ORDER — FLUORESCEIN SODIUM 10 % IV SOLN
INTRAVENOUS | Status: DC | PRN
  Administered 2018-02-19: 100 mg via INTRAVENOUS

## 2018-02-19 MED ORDER — INSULIN ASPART 100 UNIT/ML ~~LOC~~ SOLN
0.0000 [IU] | Freq: Three times a day (TID) | SUBCUTANEOUS | Status: DC
  Administered 2018-02-20: 2 [IU] via SUBCUTANEOUS
  Filled 2018-02-19: qty 1

## 2018-02-19 MED ORDER — PROPOFOL 10 MG/ML IV BOLUS
INTRAVENOUS | Status: AC
  Filled 2018-02-19: qty 20

## 2018-02-19 MED ORDER — BELLADONNA ALKALOIDS-OPIUM 16.2-60 MG RE SUPP
1.0000 | Freq: Four times a day (QID) | RECTAL | Status: DC | PRN
  Filled 2018-02-19: qty 1

## 2018-02-19 MED ORDER — BUPIVACAINE HCL (PF) 0.5 % IJ SOLN
INTRAMUSCULAR | Status: AC
  Filled 2018-02-19: qty 30

## 2018-02-19 MED ORDER — CEFAZOLIN SODIUM-DEXTROSE 1-4 GM/50ML-% IV SOLN
1.0000 g | Freq: Three times a day (TID) | INTRAVENOUS | Status: DC
  Filled 2018-02-19: qty 50

## 2018-02-19 MED ORDER — MIDAZOLAM HCL 2 MG/2ML IJ SOLN
INTRAMUSCULAR | Status: DC | PRN
  Administered 2018-02-19: 2 mg via INTRAVENOUS

## 2018-02-19 MED ORDER — DOCUSATE SODIUM 100 MG PO CAPS
100.0000 mg | ORAL_CAPSULE | Freq: Two times a day (BID) | ORAL | Status: DC
  Administered 2018-02-19 – 2018-02-21 (×3): 100 mg via ORAL
  Filled 2018-02-19 (×3): qty 1

## 2018-02-19 MED ORDER — OXYCODONE-ACETAMINOPHEN 5-325 MG PO TABS
1.0000 | ORAL_TABLET | ORAL | Status: DC | PRN
  Administered 2018-02-19: 1 via ORAL
  Administered 2018-02-20 (×2): 2 via ORAL
  Administered 2018-02-20: 1 via ORAL
  Administered 2018-02-21 (×2): 2 via ORAL
  Filled 2018-02-19 (×2): qty 2
  Filled 2018-02-19: qty 1
  Filled 2018-02-19: qty 2
  Filled 2018-02-19: qty 1
  Filled 2018-02-19: qty 2

## 2018-02-19 MED ORDER — CEFAZOLIN SODIUM-DEXTROSE 2-4 GM/100ML-% IV SOLN
INTRAVENOUS | Status: AC
  Filled 2018-02-19: qty 100

## 2018-02-19 MED ORDER — THROMBIN 5000 UNITS EX SOLR
CUTANEOUS | Status: AC
  Filled 2018-02-19: qty 5000

## 2018-02-19 MED ORDER — SUMATRIPTAN SUCCINATE 50 MG PO TABS
100.0000 mg | ORAL_TABLET | ORAL | Status: DC | PRN
  Filled 2018-02-19: qty 2

## 2018-02-19 MED ORDER — ACETAMINOPHEN 10 MG/ML IV SOLN
INTRAVENOUS | Status: DC | PRN
  Administered 2018-02-19: 1000 mg via INTRAVENOUS

## 2018-02-19 MED ORDER — CEFAZOLIN SODIUM-DEXTROSE 2-4 GM/100ML-% IV SOLN
2.0000 g | INTRAVENOUS | Status: AC
  Administered 2018-02-19 (×2): 2 g via INTRAVENOUS

## 2018-02-19 MED ORDER — OXYBUTYNIN CHLORIDE 5 MG PO TABS
5.0000 mg | ORAL_TABLET | Freq: Three times a day (TID) | ORAL | Status: DC | PRN
  Administered 2018-02-19 – 2018-02-21 (×5): 5 mg via ORAL
  Filled 2018-02-19 (×5): qty 1

## 2018-02-19 MED ORDER — SUGAMMADEX SODIUM 200 MG/2ML IV SOLN
INTRAVENOUS | Status: DC | PRN
  Administered 2018-02-19: 200 mg via INTRAVENOUS

## 2018-02-19 MED ORDER — FLUORESCEIN SODIUM 10 % IV SOLN
INTRAVENOUS | Status: AC
  Filled 2018-02-19: qty 5

## 2018-02-19 MED ORDER — SIMVASTATIN 40 MG PO TABS
40.0000 mg | ORAL_TABLET | Freq: Every day | ORAL | Status: DC
  Administered 2018-02-20 – 2018-02-21 (×2): 40 mg via ORAL
  Filled 2018-02-19 (×2): qty 1

## 2018-02-19 MED ORDER — FAMOTIDINE 20 MG PO TABS
10.0000 mg | ORAL_TABLET | Freq: Every day | ORAL | Status: DC | PRN
  Administered 2018-02-20: 10 mg via ORAL
  Filled 2018-02-19: qty 1

## 2018-02-19 MED ORDER — SUCCINYLCHOLINE CHLORIDE 20 MG/ML IJ SOLN
INTRAMUSCULAR | Status: AC
  Filled 2018-02-19: qty 1

## 2018-02-19 MED ORDER — ROCURONIUM BROMIDE 50 MG/5ML IV SOLN
INTRAVENOUS | Status: AC
  Filled 2018-02-19: qty 1

## 2018-02-19 MED ORDER — HEPARIN SODIUM (PORCINE) 5000 UNIT/ML IJ SOLN
5000.0000 [IU] | Freq: Three times a day (TID) | INTRAMUSCULAR | Status: DC
  Administered 2018-02-19 – 2018-02-20 (×3): 5000 [IU] via SUBCUTANEOUS
  Filled 2018-02-19 (×3): qty 1

## 2018-02-19 MED ORDER — INSULIN ASPART 100 UNIT/ML ~~LOC~~ SOLN
0.0000 [IU] | Freq: Every day | SUBCUTANEOUS | Status: DC
  Administered 2018-02-19: 2 [IU] via SUBCUTANEOUS
  Filled 2018-02-19: qty 1

## 2018-02-19 MED ORDER — SODIUM CHLORIDE 0.9 % IV SOLN
INTRAVENOUS | Status: DC
  Administered 2018-02-19 – 2018-02-20 (×3): via INTRAVENOUS

## 2018-02-19 MED ORDER — LIDOCAINE HCL (CARDIAC) PF 100 MG/5ML IV SOSY
PREFILLED_SYRINGE | INTRAVENOUS | Status: DC | PRN
  Administered 2018-02-19: 100 mg via INTRAVENOUS

## 2018-02-19 MED ORDER — DEXAMETHASONE SODIUM PHOSPHATE 10 MG/ML IJ SOLN
INTRAMUSCULAR | Status: DC | PRN
  Administered 2018-02-19: 10 mg via INTRAVENOUS

## 2018-02-19 MED ORDER — SEVOFLURANE IN SOLN
RESPIRATORY_TRACT | Status: AC
  Filled 2018-02-19: qty 250

## 2018-02-19 MED ORDER — LIDOCAINE HCL (PF) 1 % IJ SOLN
INTRAMUSCULAR | Status: AC
  Filled 2018-02-19: qty 30

## 2018-02-19 MED ORDER — DEXAMETHASONE SODIUM PHOSPHATE 10 MG/ML IJ SOLN
INTRAMUSCULAR | Status: AC
  Filled 2018-02-19: qty 1

## 2018-02-19 MED ORDER — FENTANYL CITRATE (PF) 250 MCG/5ML IJ SOLN
INTRAMUSCULAR | Status: AC
  Filled 2018-02-19: qty 5

## 2018-02-19 MED ORDER — LIDOCAINE HCL 1 % IJ SOLN
INTRAMUSCULAR | Status: DC | PRN
  Administered 2018-02-19: 20 mL

## 2018-02-19 MED ORDER — PROMETHAZINE HCL 25 MG/ML IJ SOLN: 6.2500 mg | INTRAMUSCULAR | Status: DC | PRN

## 2018-02-19 MED ORDER — FENTANYL CITRATE (PF) 100 MCG/2ML IJ SOLN
25.0000 ug | INTRAMUSCULAR | Status: DC | PRN
  Administered 2018-02-19: 25 ug via INTRAVENOUS
  Administered 2018-02-19: 50 ug via INTRAVENOUS
  Administered 2018-02-19: 25 ug via INTRAVENOUS

## 2018-02-19 MED ORDER — LACTATED RINGERS IV SOLN
INTRAVENOUS | Status: DC | PRN
  Administered 2018-02-19: 12:00:00 via INTRAVENOUS

## 2018-02-19 MED ORDER — CEFAZOLIN SODIUM-DEXTROSE 1-4 GM/50ML-% IV SOLN
1.0000 g | Freq: Three times a day (TID) | INTRAVENOUS | Status: AC
  Administered 2018-02-20: 1 g via INTRAVENOUS
  Filled 2018-02-19: qty 50

## 2018-02-19 MED ORDER — LIDOCAINE HCL (PF) 2 % IJ SOLN
INTRAMUSCULAR | Status: AC
  Filled 2018-02-19: qty 10

## 2018-02-19 MED ORDER — SODIUM CHLORIDE (PF) 0.9 % IJ SOLN
INTRAMUSCULAR | Status: AC
  Filled 2018-02-19: qty 20

## 2018-02-19 MED ORDER — DEXMEDETOMIDINE HCL 200 MCG/2ML IV SOLN
INTRAVENOUS | Status: DC | PRN
  Administered 2018-02-19: 4 ug via INTRAVENOUS
  Administered 2018-02-19: 8 ug via INTRAVENOUS

## 2018-02-19 MED ORDER — DIPHENHYDRAMINE HCL 12.5 MG/5ML PO ELIX
12.5000 mg | ORAL_SOLUTION | Freq: Four times a day (QID) | ORAL | Status: DC | PRN
  Filled 2018-02-19: qty 5

## 2018-02-19 MED ORDER — DIPHENHYDRAMINE HCL 50 MG/ML IJ SOLN: 12.5000 mg | Freq: Four times a day (QID) | INTRAMUSCULAR | Status: DC | PRN

## 2018-02-19 SURGICAL SUPPLY — 99 items
ANCHOR TIS RET SYS 235ML (MISCELLANEOUS) ×3 IMPLANT
APPLICATOR SURGIFLO ENDO (HEMOSTASIS) ×3 IMPLANT
APPLIER CLIP LOGIC TI 5 (MISCELLANEOUS) IMPLANT
BAG URINE DRAINAGE (UROLOGICAL SUPPLIES) ×3 IMPLANT
BLADE CLIPPER SURG (BLADE) ×3 IMPLANT
BULB RESERV EVAC DRAIN JP 100C (MISCELLANEOUS) ×3 IMPLANT
CANISTER SUCT 1200ML W/VALVE (MISCELLANEOUS) ×3 IMPLANT
CATH DRAINAGE MALECOT 26FR (CATHETERS) ×2 IMPLANT
CATH FOL 2WAY LX 18X5 (CATHETERS) ×6 IMPLANT
CATH MALECOT (CATHETERS) ×3
CHLORAPREP W/TINT 26ML (MISCELLANEOUS) ×6 IMPLANT
CLIP SUT LAPRA TY ABSORB (SUTURE) IMPLANT
CLIP VESOLOCK LG 6/CT PURPLE (CLIP) ×21 IMPLANT
CORD BIP STRL DISP 12FT (MISCELLANEOUS) ×3 IMPLANT
CORD MONOPOLAR M/FML 12FT (MISCELLANEOUS) ×3 IMPLANT
COVER MAYO STAND STRL (DRAPES) ×3 IMPLANT
COVER TIP SHEARS 8 DVNC (MISCELLANEOUS) ×2 IMPLANT
COVER TIP SHEARS 8MM DA VINCI (MISCELLANEOUS) ×1
COVER WAND RF STERILE (DRAPES) ×3 IMPLANT
DEFOGGER SCOPE WARMER CLEARIFY (MISCELLANEOUS) ×6 IMPLANT
DERMABOND ADVANCED (GAUZE/BANDAGES/DRESSINGS) ×2
DERMABOND ADVANCED .7 DNX12 (GAUZE/BANDAGES/DRESSINGS) ×4 IMPLANT
DRAIN CHANNEL JP 15F RND 16 (MISCELLANEOUS) ×3 IMPLANT
DRAIN CHANNEL JP 19F (MISCELLANEOUS) ×3 IMPLANT
DRAPE LEGGINS SURG 28X43 STRL (DRAPES) ×3 IMPLANT
DRAPE SHEET LG 3/4 BI-LAMINATE (DRAPES) ×3 IMPLANT
DRAPE SURG 17X11 SM STRL (DRAPES) ×12 IMPLANT
DRAPE UNDER BUTTOCK W/FLU (DRAPES) ×3 IMPLANT
DRIVER LRG NEEDLE DA VINCI (INSTRUMENTS) ×2
DRIVER NDLE LRG DVNC (INSTRUMENTS) ×4 IMPLANT
DRSG TELFA 3X8 NADH (GAUZE/BANDAGES/DRESSINGS) ×3 IMPLANT
ELECT REM PT RETURN 9FT ADLT (ELECTROSURGICAL) ×3
ELECTRODE REM PT RTRN 9FT ADLT (ELECTROSURGICAL) ×2 IMPLANT
FORCEPS MARYLAND BIPOLAR 8X55 (INSTRUMENTS) ×1
FORCEPS MRYLND BPLR 8X55 DVNC (INSTRUMENTS) ×2 IMPLANT
GLOVE BIO SURGEON STRL SZ 6.5 (GLOVE) ×18 IMPLANT
GOWN STRL REUS W/ TWL LRG LVL3 (GOWN DISPOSABLE) ×12 IMPLANT
GOWN STRL REUS W/TWL LRG LVL3 (GOWN DISPOSABLE) ×6
GRASPER SUT TROCAR 14GX15 (MISCELLANEOUS) ×3 IMPLANT
HEMOSTAT SURGICEL 2X14 (HEMOSTASIS) IMPLANT
HOLDER FOLEY CATH W/STRAP (MISCELLANEOUS) ×3 IMPLANT
IRRIGATION STRYKERFLOW (MISCELLANEOUS) ×2 IMPLANT
IRRIGATOR STRYKERFLOW (MISCELLANEOUS) ×3
IV LACTATED RINGERS 1000ML (IV SOLUTION) ×3 IMPLANT
KIT ACCESSORY DA VINCI DISP (KITS) ×1
KIT ACCESSORY DVNC DISP (KITS) ×2 IMPLANT
KIT PINK PAD W/HEAD ARE REST (MISCELLANEOUS) ×3
KIT PINK PAD W/HEAD ARM REST (MISCELLANEOUS) ×2 IMPLANT
LABEL OR SOLS (LABEL) ×3 IMPLANT
MARKER SKIN DUAL TIP RULER LAB (MISCELLANEOUS) ×3 IMPLANT
NEEDLE HYPO 22GX1.5 SAFETY (NEEDLE) ×3 IMPLANT
NEEDLE INSUFFLATION 14GA 120MM (NEEDLE) ×3 IMPLANT
NS IRRIG 500ML POUR BTL (IV SOLUTION) ×3 IMPLANT
PACK LAP CHOLECYSTECTOMY (MISCELLANEOUS) ×3 IMPLANT
PENCIL ELECTRO HAND CTR (MISCELLANEOUS) ×3 IMPLANT
PROGRASP ENDOWRIST DA VINCI (INSTRUMENTS) ×1
PROGRASP ENDOWRIST DVNC (INSTRUMENTS) ×2 IMPLANT
RELOAD STAPLER WHITE 60MM (STAPLE) ×2 IMPLANT
SCISSORS METZENBAUM CVD 33 (INSTRUMENTS) ×3 IMPLANT
SLEEVE ENDOPATH XCEL 5M (ENDOMECHANICALS) ×6 IMPLANT
SOLUTION ELECTROLUBE (MISCELLANEOUS) ×3 IMPLANT
SPOGE SURGIFLO 8M (HEMOSTASIS) ×1
SPONGE LAP 4X18 RFD (DISPOSABLE) ×3 IMPLANT
SPONGE SURGIFLO 8M (HEMOSTASIS) ×2 IMPLANT
SPONGE VERSALON 4X4 4PLY (MISCELLANEOUS) ×3 IMPLANT
STAPLE ECHEON FLEX 60 POW ENDO (STAPLE) ×3 IMPLANT
STAPLER RELOAD WHITE 60MM (STAPLE) ×3
STAPLER SKIN PROX 35W (STAPLE) ×3 IMPLANT
STRAP SAFETY 5IN WIDE (MISCELLANEOUS) ×3 IMPLANT
SURGILUBE 2OZ TUBE FLIPTOP (MISCELLANEOUS) ×3 IMPLANT
SUT DVC VLOC 90 3-0 CV23 UNDY (SUTURE) IMPLANT
SUT DVC VLOC 90 3-0 CV23 VLT (SUTURE) ×6
SUT ETHILON 3-0 FS-10 30 BLK (SUTURE) ×3
SUT MNCRL 4-0 (SUTURE) ×2
SUT MNCRL 4-0 27XMFL (SUTURE) ×4
SUT PROLENE 5 0 RB 1 DA (SUTURE) IMPLANT
SUT SILK 2 0 SH (SUTURE) IMPLANT
SUT VIC AB 0 CT1 36 (SUTURE) ×6 IMPLANT
SUT VIC AB 2-0 CT1 (SUTURE) IMPLANT
SUT VIC AB 2-0 SH 27 (SUTURE)
SUT VIC AB 2-0 SH 27XBRD (SUTURE) IMPLANT
SUT VIC AB 3-0 SH 27 (SUTURE) ×1
SUT VIC AB 3-0 SH 27X BRD (SUTURE) ×2 IMPLANT
SUT VICRYL 0 AB UR-6 (SUTURE) ×3 IMPLANT
SUT VICRYL 3-0 RB1 18 ABS (SUTURE) ×6 IMPLANT
SUTURE DVC VLC 90 3-0 CV23 VLT (SUTURE) ×4 IMPLANT
SUTURE EHLN 3-0 FS-10 30 BLK (SUTURE) ×2 IMPLANT
SUTURE MNCRL 4-0 27XMF (SUTURE) ×4 IMPLANT
SYR 10ML LL (SYRINGE) ×3 IMPLANT
SYR BULB IRRIG 60ML STRL (SYRINGE) IMPLANT
SYRINGE IRR TOOMEY STRL 70CC (SYRINGE) ×6 IMPLANT
TAPE CLOTH 10X20 WHT NS LF (TAPE) ×2 IMPLANT
TAPE CLOTH 2X10 WHT NS LF (TAPE) ×1
TROCAR DISP BLADELESS 8 DVNC (TROCAR) ×2 IMPLANT
TROCAR DISP BLADELESS 8MM (TROCAR) ×1
TROCAR ENDOPATH XCEL 12X100 BL (ENDOMECHANICALS) ×6 IMPLANT
TROCAR XCEL 12X100 BLDLESS (ENDOMECHANICALS) ×3 IMPLANT
TROCAR XCEL NON-BLD 5MMX100MML (ENDOMECHANICALS) ×3 IMPLANT
TUBING INSUFFLATION (TUBING) ×3 IMPLANT

## 2018-02-19 NOTE — Transfer of Care (Signed)
Immediate Anesthesia Transfer of Care Note  Patient: Dennis Hicks  Procedure(s) Performed: Procedure(s): ROBOTIC ASSISTED LAPAROSCOPIC RADICAL PROSTATECTOMY (N/A) PELVIC LYMPH NODE DISSECTION (Bilateral)  Patient Location: PACU  Anesthesia Type:General  Level of Consciousness: sedated  Airway & Oxygen Therapy: Patient Spontanous Breathing and Patient connected to face mask oxygen  Post-op Assessment: Report given to RN and Post -op Vital signs reviewed and stable  Post vital signs: Reviewed and stable  Last Vitals:  Vitals:   02/19/18 1700 02/19/18 1703  BP:  135/79  Pulse:  (!) 107  Resp:  (!) 21  Temp: (P) 36.7 C 36.7 C  SpO2:  374%    Complications: No apparent anesthesia complications

## 2018-02-19 NOTE — Anesthesia Post-op Follow-up Note (Signed)
Anesthesia QCDR form completed.        

## 2018-02-19 NOTE — Anesthesia Postprocedure Evaluation (Signed)
Anesthesia Post Note  Patient: Dennis Hicks  Procedure(s) Performed: ROBOTIC ASSISTED LAPAROSCOPIC RADICAL PROSTATECTOMY (N/A Abdomen) PELVIC LYMPH NODE DISSECTION (Bilateral Pelvis)  Patient location during evaluation: PACU Anesthesia Type: General Level of consciousness: awake and alert Pain management: pain level controlled Vital Signs Assessment: post-procedure vital signs reviewed and stable Respiratory status: spontaneous breathing, nonlabored ventilation, respiratory function stable and patient connected to nasal cannula oxygen Cardiovascular status: blood pressure returned to baseline and stable Postop Assessment: no apparent nausea or vomiting Anesthetic complications: no     Last Vitals:  Vitals:   02/19/18 1816 02/19/18 1848  BP: (!) 147/82 (!) 143/69  Pulse: 98 94  Resp: 14 14  Temp: 36.9 C 37.1 C  SpO2: 97% 97%    Last Pain:  Vitals:   02/19/18 1848  TempSrc: Oral  PainSc:                  [name] Jame Morrell

## 2018-02-19 NOTE — H&P (Signed)
12/26/2017  Updated 02/19/2018 without change RRR CTAB  Dennis Hicks Aug 23, 1955 [id number]  Referring provider: Jinny Sanders, MD [address] [place], [place] [address]      Chief Complaint  Patient presents with  . Prostate Cancer    Biopsy Results    HPI: 62 year old male with a personal history of prostate cancer previously diagnosed by Dr. Junious Silk on active surveillance who returns today after repeat prostate biopsy shows upstaging of his disease to high risk.  He has a personal history of prostate cancer, diagnosed with low volume intermediate risk prostate cancer in August 2018.  PSA at the time of diagnosis was 6.1, as high as 7.4.  CT1c, TRUS vol 43 cc.    As part of his continued surveillance, he underwent prostate MRI in June 2018 which showed 2 PI-RADS 4 lesions in the left mid gland as well as the right anterior mid gland without evidence of metastatic disease, adenopathy, or extracapsular extension.  He did have thickened vas deferens bilaterally.  He underwent prostate biopsy on 12/08/2017 which shows an upstaging of his prostate cancer.  This now reveals Gleason 4+4 in 1 core at the right apex involving up to 68% of the tissue.  Most recent PSA 6.41.    He was called with the aforementioned results and underwent further staging with CT pelvis with contrast on 12/19/2017 which showed no evidence of metastatic disease.  No previous abdominal surgeries.  He does have mild to moderate ED, SHIM below.  Minimal urinary symptoms.         IPSS           Row Name 12/26/17 1700               International Prostate Symptom Score    How often have you had the sensation of not emptying your bladder?  About half the time      How often have you had to urinate less than every two hours?  About half the time      How often have you found you stopped and started again several times when you urinated?  About half the time      How  often have you found it difficult to postpone urination?  Less than half the time      How often have you had a weak urinary stream?  About half the time      How often have you had to strain to start urination?  Less than 1 in 5 times      How many times did you typically get up at night to urinate?  5 Times      Total IPSS Score  20             Quality of Life due to urinary symptoms    If you were to spend the rest of your life with your urinary condition just the way it is now how would you feel about that?  Mostly Satisfied         Score:  1-7 Mild 8-19 Moderate 20-35 Severe         SHIM           Row Name 12/26/17 1707               SHIM: Over the last 6 months:    How do you rate your confidence that you could get and keep an erection?  Moderate      When  you had erections with sexual stimulation, how often were your erections hard enough for penetration (entering your partner)?  Sometimes (about half the time)      During sexual intercourse, how often were you able to maintain your erection after you had penetrated (entered) your partner?  Sometimes (about half the time)      During sexual intercourse, how difficult was it to maintain your erection to completion of intercourse?  Difficult      When you attempted sexual intercourse, how often was it satisfactory for you?  Sometimes (about half the time)             SHIM Total Score    SHIM  15         Score: 1-7 Severe ED 8-11 Moderate ED 12-16 Mild-Moderate ED 17-21 Mild ED 22-25 No ED   PMH:     Past Medical History:  Diagnosis Date  . Borderline diabetes   . Diabetes mellitus without complication ([place])   . Heartburn   . HLD (hyperlipidemia)   . Hypertension   . Melanoma ([place]) 2018   Right chest area resected  . Psoriasis     Surgical History: No past surgical history on file.  Home Medications:       Allergies as of 12/26/2017       Reactions   Chocolate Other (See Comments)   Migraine   Shrimp [shellfish Allergy] Other (See Comments)   Migraines               Medication List            Accurate as of 12/26/17 11:59 PM. Always use your most recent med list.           benazepril-hydrochlorthiazide 20-25 MG tablet Commonly known as:  LOTENSIN HCT TAKE 1 TABLET DAILY   betamethasone dipropionate 0.05 % cream Commonly known as:  DIPROLENE Apply 1 application topically 2 (two) times daily.   famotidine 10 MG chewable tablet Commonly known as:  PEPCID AC Chew 10 mg by mouth daily.   HYDROcodone-acetaminophen 5-325 MG tablet Commonly known as:  NORCO/VICODIN Take one tablet every 6-8 hours as needed for pain.   LORazepam 1 MG tablet Commonly known as:  ATIVAN Take 1 tablet (1 mg total) by mouth every 8 (eight) hours.   metFORMIN 500 MG 24 hr tablet Commonly known as:  GLUCOPHAGE-XR TAKE 1 TABLET BY MOUTH EVERY MORNING WITH BREAKFAST   sildenafil 100 MG tablet Commonly known as:  VIAGRA TAKE 1 TABLET AS NEEDED FOR ERECTILE DYSFUNCTION   simvastatin 40 MG tablet Commonly known as:  ZOCOR Take 1 tablet (40 mg total) by mouth daily.   SUMAtriptan 100 MG tablet Commonly known as:  IMITREX TAKE 1 TABLET BY MOUTH EVERY 2 HOURS AS NEEDED, MAX OF 2 TABLETS IN 24 HOURS       Allergies:       Allergies  Allergen Reactions  . Chocolate Other (See Comments)    Migraine  . Shrimp [Shellfish Allergy] Other (See Comments)    Migraines    Family History:      Family History  Problem Relation Age of Onset  . Hypertension Mother   . Coronary artery disease Father   . Hypertension Father   . Hyperlipidemia Father   . Diabetes Father   . Prostate cancer Neg Hx   . Kidney cancer Neg Hx   . Bladder Cancer Neg Hx     Social History:  reports that he has  never smoked. He has never used smokeless tobacco. He reports that he drinks alcohol. He reports that he  does not use drugs.  ROS: UROLOGY Frequent Urination?: No Hard to postpone urination?: No Burning/pain with urination?: No Get up at night to urinate?: No Leakage of urine?: No Urine stream starts and stops?: No Trouble starting stream?: No Do you have to strain to urinate?: No Blood in urine?: No Urinary tract infection?: No Sexually transmitted disease?: No Injury to kidneys or bladder?: No Painful intercourse?: No Weak stream?: No Erection problems?: No Penile pain?: No  Gastrointestinal Nausea?: No Vomiting?: No Indigestion/heartburn?: No Diarrhea?: No Constipation?: No  Constitutional Fever: No Night sweats?: No Weight loss?: No Fatigue?: No  Skin Skin rash/lesions?: No Itching?: No  Eyes Blurred vision?: No  Ears/Nose/Throat Sore throat?: No Sinus problems?: No  Hematologic/Lymphatic Swollen glands?: No Easy bruising?: No  Cardiovascular Leg swelling?: No Chest pain?: No  Respiratory Cough?: No Shortness of breath?: No  Endocrine Excessive thirst?: No  Musculoskeletal Back pain?: No Joint pain?: No  Neurological Headaches?: No Dizziness?: No  Psychologic Depression?: No Anxiety?: No  Physical Exam: BP 124/80   Pulse 97   Ht 5\' 7"  (1.702 m)   Wt 215 lb (97.5 kg)   BMI 33.67 kg/m   Constitutional:  Alert and oriented, No acute distress.  Accompanied by wife today. HEENT: [place] AT, moist mucus membranes.  Trachea midline, no masses. Cardiovascular: No clubbing, cyanosis, or edema. Respiratory: Normal respiratory effort, no increased work of breathing. GI: Abdomen is soft, nontender, nondistended, no abdominal masses, obese, no scars. Skin: No rashes, bruises or suspicious lesions. Neurologic: Grossly intact, no focal deficits, moving all 4 extremities. Psychiatric: Normal mood and affect.  Laboratory Data: RecentLabs       Lab Results  Component Value Date   WBC 8.4 09/07/2017   HGB 15.6 09/07/2017   HCT  44.9 09/07/2017   MCV 87.3 09/07/2017   PLT 269.0 09/07/2017      RecentLabs       Lab Results  Component Value Date   CREATININE 1.20 12/19/2017      RecentLabs       Lab Results  Component Value Date   PSA 6.29 (H) 11/26/2015   PSA 5.34 (H) 03/10/2015   PSA 3.76 03/03/2014      RecentLabs       Lab Results  Component Value Date   HGBA1C 6.9 (H) 12/07/2017      Urinalysis Labs(Brief)          Component Value Date/Time   COLORURINE STRAW (A) 05/13/2016 0859   APPEARANCEUR Clear 07/11/2016 1503   LABSPEC 1.015 05/13/2016 0859   PHURINE 7.5 05/13/2016 0859   GLUCOSEU Negative 07/11/2016 1503   HGBUR NEGATIVE 05/13/2016 0859   BILIRUBINUR Negative 07/11/2016 1503   KETONESUR NEGATIVE 05/13/2016 0859   PROTEINUR Negative 07/11/2016 1503   PROTEINUR NEGATIVE 05/13/2016 0859   UROBILINOGEN 0.2 04/26/2016 1110   NITRITE Negative 07/11/2016 1503   NITRITE NEGATIVE 05/13/2016 0859   LEUKOCYTESUR Negative 07/11/2016 1503      RecentLabs       Lab Results  Component Value Date   LABMICR See below: 07/11/2016   WBCUA 0-5 07/11/2016   RBCUA 0-2 07/11/2016   LABEPIT None seen 07/11/2016   BACTERIA None seen 07/11/2016      Pertinent Imaging:     Results for orders placed during the hospital encounter of 06/06/16  CT HEMATURIA WORKUP   Narrative CLINICAL DATA:  Recent episode  of gross hematuria. No current symptoms are reported. Chronic kidney disease. History of melanoma.  EXAM: CT ABDOMEN AND PELVIS WITHOUT AND WITH CONTRAST  TECHNIQUE: Multidetector CT imaging of the abdomen and pelvis was performed following the standard protocol before and following the bolus administration of intravenous contrast.  CONTRAST:  125 cc Isovue-300 IV.  COMPARISON:  None.  FINDINGS: Lower chest: No significant pulmonary nodules or acute consolidative airspace disease. Left circumflex coronary  atherosclerosis.  Hepatobiliary: Mild diffuse hepatic steatosis. Normal liver size. No liver surface irregularity. No liver mass Normal gallbladder with no radiopaque cholelithiasis. No biliary ductal dilatation.  Pancreas: Normal, with no mass or duct dilation.  Spleen: Normal size. No mass.  Adrenals/Urinary Tract: Normal adrenals. Nonobstructing 3 mm interpolar left renal stone. No additional renal stones. No hydronephrosis. Normal caliber ureters, with no ureteral stones. No right renal cortical lesions. Two subcentimeter hypodense renal cortical lesions in the lower left kidney are too small to characterize and require no further follow-up. On delayed imaging, there is no urothelial wall thickening and there are no filling defects in the opacified portions of the bilateral collecting systems or ureters. No bladder stones, wall thickening, masses or diverticula.  Stomach/Bowel: Small hiatal hernia. Otherwise grossly normal stomach. Normal caliber small bowel with no small bowel wall thickening. Normal appendix. Normal large bowel with no diverticulosis, large bowel wall thickening or pericolonic fat stranding.  Vascular/Lymphatic: Atherosclerotic nonaneurysmal abdominal aorta. Patent portal, splenic, hepatic and renal veins. No pathologically enlarged lymph nodes in the abdomen or pelvis.  Reproductive: Prostate dimensions 4.3 x 3.9 x 4.6 cm (volume = 40 cm^3). Mildly enlarged prostate. Nonspecific internal prostatic calcifications.  Other: No pneumoperitoneum, ascites or focal fluid collection.  Musculoskeletal: No aggressive appearing focal osseous lesions. Moderate thoracolumbar spondylosis.  IMPRESSION: 1. Solitary nonobstructing 3 mm left renal stone. No hydronephrosis. No ureteral or bladder stones. 2. No suspicious renal cortical masses.  No urothelial lesions . 3. Mildly enlarged prostate. 4. Additional findings include aortic atherosclerosis,  coronary atherosclerosis, mild diffuse hepatic steatosis and small hiatal hernia.   Electronically Signed   By: Ilona Sorrel M.D.   On: 06/06/2016 16:34    CT scan personally reviewed today.  Assessment & Plan:    1. Prostate cancer ([place]) 62 year old male with now evidence of high risk prostate cancer, Gleason 4+4 without evidence of metastatic disease MSK nomogram was reviewed personally today with the patient We discussed treatment options at length today including radical prostatectomy with bilateral lymph node dissection versus IMRT with 2 to 3 years of ADT Active surveillance is no longer treatment option as per both AUA/NCCN guidelines  We discussed surgical therapy for prostate cancer including the different available surgical approaches. We discussed, in detail, the risks and expectations of surgery with regard to cancer control, urinary control, and erectile dysfunction as well as expected post operative cover he processed. Additional risks of surgery including but not limalited to bleeding, infection, hernia formation, nerve damage, fistula formation, bowel/rect injury, potentially necessitating colostomy, damage to the urinary tract resulting in urinary leakage, urethral stricture, and cardiopulmonary risk such as myocardial infarction, stroke, death, thromboembolism etc. were explained. The risk of open surgical conversion for robotics/laparoscopic prostatectomy is also discussed.  The preoperative, intraoperative, and postoperative courses were also discussed.  Need for Foley catheter for 1 week postop was also reviewed.  All questions were answered today.  I would like him to see the radiation oncology as well for an alternative opinion.  He will let us know  how he like to proceed based on these conversations.  - Ambulatory referral to [place], MD  [facility] [address], [place], [place]  [address] [phone]  I spent 40 min with this patient of which greater than 50% was spent in counseling and coordination of care with the patient.

## 2018-02-19 NOTE — Anesthesia Preprocedure Evaluation (Signed)
Anesthesia Evaluation  Patient identified by MRN, date of birth, ID band Patient awake    Reviewed: Allergy & Precautions, H&P , NPO status , Patient's Chart, lab work & pertinent test results, reviewed documented beta blocker date and time   History of Anesthesia Complications Negative for: history of anesthetic complications  Airway Mallampati: III  TM Distance: >3 FB Neck ROM: full    Dental  (+) Dental Advidsory Given   Pulmonary neg shortness of breath, neg sleep apnea, neg COPD, Recent URI , Residual Cough,           Cardiovascular Exercise Tolerance: Good hypertension, (-) angina(-) CAD, (-) Past MI, (-) Cardiac Stents and (-) CABG (-) dysrhythmias (-) Valvular Problems/Murmurs     Neuro/Psych negative neurological ROS  negative psych ROS   GI/Hepatic Neg liver ROS, GERD  ,  Endo/Other  diabetes  Renal/GU Renal disease  negative genitourinary   Musculoskeletal   Abdominal   Peds  Hematology negative hematology ROS (+)   Anesthesia Other Findings Past Medical History: No date: Borderline diabetes No date: Bronchitis No date: Diabetes mellitus without complication ([facility]) No date: Headache No date: Heartburn No date: HLD (hyperlipidemia) No date: Hypertension 2018: Melanoma ([place])     Comment:  Right chest area resected No date: Pneumonia     Comment:  x2 Aug. 2017 No date: Psoriasis No date: Renal insufficiency   Reproductive/Obstetrics negative OB ROS                             Anesthesia Physical Anesthesia Plan  ASA: II  Anesthesia Plan: General   Post-op Pain Management:    Induction: Intravenous  PONV Risk Score and Plan: 2 and Ondansetron, Dexamethasone, Midazolam, Promethazine and Treatment may vary due to age or medical condition  Airway Management Planned: Oral ETT  Additional Equipment:   Intra-op Plan:   Post-operative Plan: Extubation in  OR  Informed Consent: I have reviewed the patients History and Physical, chart, labs and discussed the procedure including the risks, benefits and alternatives for the proposed anesthesia with the patient or authorized representative who has indicated his/her understanding and acceptance.   Dental Advisory Given  Plan Discussed with: Anesthesiologist, CRNA and Surgeon  Anesthesia Plan Comments:         Anesthesia Quick Evaluation

## 2018-02-19 NOTE — Op Note (Signed)
02/19/18  PREOPERATIVE DIAGNOSIS: Prostate cancer.  POSTOPERATIVE DIAGNOSIS: Prostate cancer.  OPERATION PERFORMED: 1. DaVinci laparoscopic radical prostatectomy (bilateral partial nerve sparing) 2 DaVinci laproscopic bilateral pelvic lymph node dissection.  SURGEON: Hollice Espy, MD  ASSISTANTS: Nickolas Madrid, MD  ANESTHESIA: General.  EBL: 250 cc  SPECIMEN: Prostate with bilateral seminal vesicals, bilateral pelvic lymph nodes, anterior fat pad, posterior bladder neck margin  FINDINGS: Accessory Pudendal Vessel: none  INDICATION: Pt.is a 62 year old male with Gleason  4+4 prostate cancer. Treatment options were discussed with him at length and he chose DaVinci radical prostatectomy.  Poor baseline erections therefore plan for non-nerve sparing. Bilateral pelvic lymph node dissection was planned due to his risk stratification.  PROCEDURE IN DETAIL: Patient was given Ancef preoperatively. He had sequential compression devices applied preoperatively for DVT prophylaxis. He was taken to the operating room where he was induced with general anesthesia. After adequate anesthesia, he was placed in the dorsal lithotomy position. His arms were draped by his side and was appropriately padded and secured to the operating room table. He was placed in the Trendelenburg position.  He was prepped and draped in sterile fashion. An 80 French Foley was placed in the bladder and instilled with 15 cc sterile water. Orogastric tube was placed. The Veress needle was passed just below the umbilicus and the abdomen was insufflated to 15 atmospheres. A 12 mm, blunt-tip trocar was placed just above the umbilicus. The zero-degree camera was passed within this and the following trocars were placed under direct vision; 8 mm robotic trocars were placed 9 cm laterally and inferiorly to the initially placed umbilical trocar. A third one was placed 7 cm lateral to the left-sided trocar. In  the corresponding position on the right side, a 12 mm trocar was placed, and then a 5 mm trocar was placed to the right and well above the umbilicus.  The robot was then docked with the robot trocar. I used the zero-degree camera. I had the hot scissors in the right hand and the left hand with the [place] bipolar and far left hand the Prograsp forceps. Initially I divided the median umbilical ligament bilaterally and the urachus and developed the space of Retzius down to pubic bone. I divided the peritoneum laterally up to the vas deferens on each side. I used the Prograsp forceps to provide cranial traction on the urachus. I cleaned off the Endopelvic fascia on each side and then divided it with the scissors laterally to the perirectal fat and medially to the puboprostatic ligaments which were divided. I then ligated the dorsal vein complex using a 60 mm vascular load stapler.   I then addressed the bladder neck with a 30-degree down lens. I identified the bladder neck by pulling on the Foley catheter. I divided the anterior bladder neck musculature until I then found the anterior bladder neck mucosa which was incised. I identified the Foley catheter within, deflated the balloon, pulled the Foley out through this opening and then using the Carter-Thomason needle with a #0-Vicryl suture, passed through The suprapubic region and pulled the suture through the eye of the Foley and then back out. This allowed me to provide upward traction on the prostate. I then divided the lateral bladder neck mucosa and the posterior bladder neck mucosa. I was well away from ureteral orifices. I divided the posterior bladder neck musculature until I identified the vas deferens.  The bladder neck was somewhat thickened and thus a margin of the posterior bladder neck in 2  different areas was sent as a final pathology specimen to ensure the margin status.  In freeing of the bladder neck, the posterior  bladder neck itself was somewhat tethered to the posterior aspect of the prostate this too small buttonhole incisions were created in the bladder neck mucosa near but not involving the trigone.  Each of these buttonholes were closed using a running 2-0 Vicryl suture.  The seminal vesicles were freed proximally, then divided, doubly had calcified feel. I freed up the seminal vesicals using blunt and sharp dissection. Judicious use of electrocautery was used near the seminal vesicle tips to avoid injury to neurovascular bundle.   I then went back to the 0-degree lens. I divided the Denonvilliers fascia beneath the prostate and developed the prostate off the rectum. I then did a bilateral non nerve sparing by  creating a plane which was extrafascial. I then isolated the pedicles of the prostate and placed weck clips on the pedicles of the prostate and then divided it with cold scissors. I continued to divide the neurovascular bundles off the prostate out to the apex of the prostate. At this point the prostate was freed up except for the urethra. I addressed the prostate anteriorly, divided the dorsal vein , then the anterior urethral wall, pulled the Foley catheter back and then divided the posterior urethral wall. Specimen was completely freed up. I placed the prostate in an Endo catch bag and then placed the bag in the upper abdomen out of the way. I then irrigated the pelvis. The rectal test was negative. There was reasonable hemostasis.  I then did the pelvic lymph node dissection by incising the fascia overlying the right external iliac vein, dissecting distally.  Notably at the very beginning of the case and taken down the bladder neck, a large right-sided pelvic lymph node was encountered which was included with the right pelvic lymph node dissection specimen.  This lymph node was adjacent to the external iliac artery.  I went just distal to the node of [place] where we placed clips and then  divided the lymphatics. The lateral aspect of the dissection was the pelvic side wall, inferior was the obturator nerve and proximal the hypogastric vessels. I placed clips at the proximal aspect and then divided the lymphatics. This was removed with the spoon grasper and sent to pathology.   I then did the left obturator lymph node dissection in the same fashion as the left side.  With good hemostasis, I then did the posterior reconstruction. I used a 3-0 VLoc suture on an RB1 through the cut edge of Denonvilliers fascia beneath the bladder on the right side and through the posterior striated sphincter underneath the urethra. This brought the bladder neck and urethra and closer proximity to help facilitate anastomosis.   I then did the urethral vesicle anastomosis again with two 3-0 VLoc sutures on an RB1 needle interlocked. I passed both ends of the suture from the outside-in through the bladder neck at the 6 o'clock position. I passed both through the urethral stump from the inside-out in the corresponding position. I reapproximated the bladder neck to the urethra.  Notably, the bladder neck was thinned out in a few areas making the anastomosis quite challenging as well as he had a relatively tight pelvis and prominent pubic symphysis.  The anastomosis was technically challenging.  I then ran the Left suture on the left side anastomosis to the 9 o'clock position. Then I went back to the right sided suture  and ran that up the right side to the 12 o'clock position. I then continued the left suture to the 12 o'clock position.The suture was then suspended anteriorly behind the pubic bone.   I then placed a new 3 French Foley into the bladder and filled it with 10 cc sterile water. I irrigated the bladder with 60 cc there was a small leak posteriorly appreciated. There was reasonable hemostasis.  Surgicel was used on either side of the pedicles for an additional hemostatasis as well as  within the LN fossa.   The instruments were then removed. The robot was undocked and all the trocars were removed under direct vision. There was good hemostasis. I then enlarged the umbilical trocar site large enough to remove the prostate and I closed the fascia here with #0-0 Vicryl suture in a running fashion. All the port sites were irrigated. Lidocaine was injected into all the trocar sites. The skin was closed with 4-0 Monocryl in running subcuticular fashion. Dermabond was Applied.  Unfortunately, the needle counts were not correct.  All needles were visually seen leaving the patient's body with each removal.  We did get an x-ray to confirm that there were no needles inside of the body which were not appreciated thus the patient was awakened.   At this point patient was awakened and extubated in the operating room and taken to the recovery room in stable condition  Hollice Espy, MD

## 2018-02-19 NOTE — Anesthesia Procedure Notes (Signed)
Procedure Name: Intubation Date/Time: 02/19/2018 12:02 PM Performed by: Lavone Orn, CRNA Pre-anesthesia Checklist: Patient identified, Emergency Drugs available, Suction available, Patient being monitored and Timeout performed Patient Re-evaluated:Patient Re-evaluated prior to induction Oxygen Delivery Method: Circle system utilized Preoxygenation: Pre-oxygenation with 100% oxygen Induction Type: IV induction Ventilation: Mask ventilation without difficulty Laryngoscope Size: Mac and 4 Grade View: Grade I Tube type: Oral Tube size: 7.5 mm Number of attempts: 1 Airway Equipment and Method: Stylet Placement Confirmation: ETT inserted through vocal cords under direct vision,  positive ETCO2 and breath sounds checked- equal and bilateral Secured at: 22 cm Tube secured with: Tape Dental Injury: Teeth and Oropharynx as per pre-operative assessment

## 2018-02-19 NOTE — OR Nursing (Signed)
Incorrect needle count,md notified and xray taken. MD reviewed xray and verified no retained foreign body present.

## 2018-02-20 ENCOUNTER — Encounter: Payer: Self-pay | Admitting: Urology

## 2018-02-20 ENCOUNTER — Telehealth: Payer: Self-pay | Admitting: Urology

## 2018-02-20 ENCOUNTER — Other Ambulatory Visit: Payer: Self-pay

## 2018-02-20 DIAGNOSIS — C61 Malignant neoplasm of prostate: Secondary | ICD-10-CM | POA: Diagnosis not present

## 2018-02-20 LAB — BASIC METABOLIC PANEL
Anion gap: 7 (ref 5–15)
BUN: 18 mg/dL (ref 8–23)
CO2: 26 mmol/L (ref 22–32)
Calcium: 8 mg/dL — ABNORMAL LOW (ref 8.9–10.3)
Chloride: 105 mmol/L (ref 98–111)
Creatinine, Ser: 1.36 mg/dL — ABNORMAL HIGH (ref 0.61–1.24)
GFR calc Af Amer: >60 mL/min (ref >60)
GFR calc non Af Amer: 56 mL/min — ABNORMAL LOW (ref >60)
Glucose, Bld: 188 mg/dL — ABNORMAL HIGH (ref 70–99)
Potassium: 3.7 mmol/L (ref 3.5–5.1)
Sodium: 138 mmol/L (ref 135–145)

## 2018-02-20 LAB — GLUCOSE, CAPILLARY
GLUCOSE-CAPILLARY: 140 mg/dL — AB (ref 70–99)
Glucose-Capillary: 144 mg/dL — ABNORMAL HIGH (ref 70–99)
Glucose-Capillary: 145 mg/dL — ABNORMAL HIGH (ref 70–99)
Glucose-Capillary: 107 mg/dL — ABNORMAL HIGH (ref 70–99)
Glucose-Capillary: 197 mg/dL — ABNORMAL HIGH (ref 70–99)

## 2018-02-20 LAB — CREATININE, FLUID (PLEURAL, PERITONEAL, JP DRAINAGE): Creat, Fluid: 2.2 mg/dL

## 2018-02-20 LAB — CBC
HCT: 36.4 % — ABNORMAL LOW (ref 39.0–52.0)
Hemoglobin: 11.9 g/dL — ABNORMAL LOW (ref 13.0–17.0)
MCH: 29.7 pg (ref 26.0–34.0)
MCHC: 32.7 g/dL (ref 30.0–36.0)
MCV: 90.8 fL (ref 80.0–100.0)
Platelets: 256 10*3/uL (ref 150–400)
RBC: 4.01 MIL/uL — AB (ref 4.22–5.81)
RDW: 12.6 % (ref 11.5–15.5)
WBC: 13.5 10*3/uL — ABNORMAL HIGH (ref 4.0–10.5)
nRBC: 0 % (ref 0.0–0.2)

## 2018-02-20 MED ORDER — OXYBUTYNIN CHLORIDE 5 MG PO TABS: 5.0000 mg | ORAL_TABLET | Freq: Three times a day (TID) | ORAL | 0 refills | Status: DC | PRN

## 2018-02-20 MED ORDER — CALCIUM CARBONATE ANTACID 500 MG PO CHEW
1.0000 | CHEWABLE_TABLET | Freq: Two times a day (BID) | ORAL | Status: DC
  Administered 2018-02-20 – 2018-02-21 (×3): 200 mg via ORAL
  Filled 2018-02-20 (×2): qty 1

## 2018-02-20 MED ORDER — DOCUSATE SODIUM 100 MG PO CAPS: 100.0000 mg | ORAL_CAPSULE | Freq: Two times a day (BID) | ORAL | 0 refills | Status: DC

## 2018-02-20 MED ORDER — OXYCODONE-ACETAMINOPHEN 5-325 MG PO TABS: 1.0000 | ORAL_TABLET | ORAL | 0 refills | Status: DC | PRN

## 2018-02-20 NOTE — Discharge Summary (Addendum)
Date of admission: 02/19/2018  Date of discharge: 02/21/2018  Admission diagnosis: Prostate cancer  Discharge diagnosis: Prostate cancer  Secondary diagnoses:  Patient Active Problem List   Diagnosis Date Noted  . Prostate cancer ([place]) 02/19/2018  . Lateral epicondylitis of right elbow 03/03/2017  . Idiopathic hematuria 04/26/2016  . Chronic right shoulder pain 11/29/2013  . CKD stage 3 due to type 2 diabetes mellitus ([place]) 11/29/2013  . Diabetes mellitus due to underlying condition with renal complication ([place]) 18/29/9371  . Right knee pain 01/08/2013  . Routine general medical examination at a health care facility 10/13/2010  . PROSTATE SPECIFIC ANTIGEN, ELEVATED 09/25/2009  . HYPERCHOLESTEROLEMIA 09/23/2009  . MIGRAINE, COMMON 09/23/2009  . Benign hypertension 09/23/2009  . GERD 09/23/2009  . ERECTILE DYSFUNCTION, ORGANIC 09/23/2009  . DERMATITIS, ATOPIC 09/23/2009  . Chronic low back pain 09/23/2009    History and Physical: For full details, please see admission history and physical. Briefly, Dennis Hicks is a 62 y.o. year old patient with personal history of high risk prostate cancer status post radical prostatectomy with bilateral pelvic lymph node dissection admitted for postoperative observation.   Hospital Course: Patient tolerated the procedure well.  He was then transferred to the floor after an uneventful PACU stay.  His hospital course was uncomplicated.  On POD#1 he had met discharge criteria: was eating a regular diet, was up and ambulating independently,  pain was well controlled, and was ready to for discharge.  He did have fairly low drain output, however the drain creatinine was mildly elevated to 2.2 this was left in place at the time of discharge.  He underwent Foley catheter teaching prior to discharge as well.  Addendum: Later in the afternoon, the patient was having increased pain with ambulating and changing position.  He elected to stay until postop day  2.  A second drain creatinine was checked on this day and was more elevated than the previous day thus the JP drain was left at the time of discharge.  Physical Exam  Constitutional: He is oriented to person, place, and time. He appears well-developed and well-nourished.  HENT:  Head: Normocephalic and atraumatic.  Cardiovascular: Normal rate.  Pulmonary/Chest: Effort normal. No respiratory distress.  Abdominal: Soft. He exhibits no distension.  Clean dry and intact.  JP in left lower quadrant draining serosanguineous fluid.  Genitourinary: Penis normal.  Genitourinary Comments: Foley catheter in place draining orange-tinged urine.  Neurological: He is alert and oriented to person, place, and time.  Skin: Skin is warm and dry.  Psychiatric: He has a normal mood and affect.  Vitals reviewed.    Laboratory values:  Recent Labs    02/20/18 0602  WBC 13.5*  HGB 11.9*  HCT 36.4*   Recent Labs    02/20/18 0602  NA 138  K 3.7  CL 105  CO2 26  GLUCOSE 188*  BUN 18  CREATININE 1.36*  CALCIUM 8.0*   No results for input(s): LABPT, INR in the last 72 hours. No results for input(s): LABURIN in the last 72 hours. Results for orders placed or performed during the hospital encounter of 02/08/18  Urine culture     Status: None   Collection Time: 02/08/18  9:31 AM  Result Value Ref Range Status   Specimen Description   Final    URINE, CLEAN CATCH Performed at [facility], [address], [place], [place] [address]    Special Requests   Final    NONE Performed at [facility]  Lab, [address] [place], [place] [address]    Culture   Final    NO GROWTH Performed at [facility], [place] [address] [place], [place] [address]    Report Status 02/09/2018 FINAL  Final    Disposition: Home  Discharge instruction: See discharge instructions.  Routine Foley catheter care.  Avoid lifting greater than 5 to 10 pounds.  Please monitor and record drain outputs.   Do not shower until your drain is removed.  Discharge medications:  Allergies as of 02/20/2018      Reactions   Chocolate Other (See Comments)   Trigger Migraine   Shrimp [shellfish Allergy] Other (See Comments)   Trigger Migraines      Medication List    TAKE these medications   acetaminophen 500 MG tablet Commonly known as:  TYLENOL Take 1,000 mg by mouth every 6 (six) hours as needed (headache).   aspirin EC 81 MG tablet Take 324 mg by mouth daily as needed (headache).   benazepril-hydrochlorthiazide 20-25 MG tablet Commonly known as:  LOTENSIN HCT TAKE 1 TABLET DAILY   docusate sodium 100 MG capsule Commonly known as:  COLACE Take 1 capsule (100 mg total) by mouth 2 (two) times daily.   famotidine 10 MG chewable tablet Commonly known as:  PEPCID AC Chew 10 mg by mouth daily as needed for heartburn.   HYDROcodone-acetaminophen 5-325 MG tablet Commonly known as:  NORCO/VICODIN Take 1 tablet by mouth daily as needed for moderate pain.   HYDROcodone-homatropine 5-1.5 MG/5ML syrup Commonly known as:  HYCODAN Take 5 mLs by mouth every 8 (eight) hours as needed for cough.   metFORMIN 500 MG 24 hr tablet Commonly known as:  GLUCOPHAGE-XR Take 1 tablet (500 mg total) by mouth daily with breakfast.   oxybutynin 5 MG tablet Commonly known as:  DITROPAN Take 1 tablet (5 mg total) by mouth every 8 (eight) hours as needed for bladder spasms.   oxyCODONE-acetaminophen 5-325 MG tablet Commonly known as:  PERCOCET/ROXICET Take 1-2 tablets by mouth every 4 (four) hours as needed for moderate pain or severe pain.   sildenafil 100 MG tablet Commonly known as:  VIAGRA TAKE 1 TABLET AS NEEDED FOR ERECTILE DYSFUNCTION   simvastatin 40 MG tablet Commonly known as:  ZOCOR Take 1 tablet (40 mg total) by mouth daily.   SUMAtriptan 100 MG tablet Commonly known as:  IMITREX TAKE 1 TABLET BY MOUTH EVERY 2 HOURS AS NEEDED, MAX OF 2 TABLETS IN 24 HOURS       Followup:    All  follow-ups will be arranged by our office.    He will come in on Monday to [facility] for the lab for drain creatinine. You be scheduled for cystogram next week followed by subsequent Foley removal.

## 2018-02-20 NOTE — Progress Notes (Signed)
Patient holloring out, grimacing, c/o severe, sharp RLQ pain; pulled up in bed, Percocet 2 given and Ditropan also given; patient stated that he thought it was "gas pains.Marland Kitchenit was moving around and is now getting better". Foley with yellow/brown urine, no clots noted; Barbaraann Faster, RN 3:10 AM 02/20/2018

## 2018-02-20 NOTE — Care Management (Signed)
During progression, members of the care team state there are no discharge needs or concerns

## 2018-02-20 NOTE — Discharge Instructions (Signed)
Indwelling Urinary Catheter Care, Adult Take good care of your catheter to keep it working and to prevent problems. How to wear your catheter Attach your catheter to your leg with tape (adhesive tape) or a leg strap. Make sure it is not too tight. If you use tape, remove any bits of tape that are already on the catheter. How to wear a drainage bag You should have:  A large overnight bag.  A small leg bag.  Overnight Bag You may wear the overnight bag at any time. Always keep the bag below the level of your bladder but off the floor. When you sleep, put a clean plastic bag in a wastebasket. Then hang the bag inside the wastebasket. Leg Bag Never wear the leg bag at night. Always wear the leg bag below your knee. Keep the leg bag secure with a leg strap or tape. How to care for your skin  Clean the skin around the catheter at least once every day.  Shower every day. Do not take baths.  Put creams, lotions, or ointments on your genital area only as told by your doctor.  Do not use powders, sprays, or lotions on your genital area. How to clean your catheter and your skin 1. Wash your hands with soap and water. 2. Wet a washcloth in warm water and gentle (mild) soap. 3. Use the washcloth to clean the skin where the catheter enters your body. Clean downward and wipe away from the catheter in small circles. Do not wipe toward the catheter. 4. Pat the area dry with a clean towel. Make sure to clean off all soap. How to care for your drainage bags Empty your drainage bag when it is ?- full or at least 2-3 times a day. Replace your drainage bag once a month or sooner if it starts to smell bad or look dirty. Do not clean your drainage bag unless told by your doctor. Emptying a drainage bag  Supplies Needed  Rubbing alcohol.  Gauze pad or cotton ball.  Tape or a leg strap.  Steps 1. Wash your hands with soap and water. 2. Separate (detach) the bag from your leg. 3. Hold the bag over  the toilet or a clean container. Keep the bag below your hips and bladder. This stops pee (urine) from going back into the tube. 4. Open the pour spout at the bottom of the bag. 5. Empty the pee into the toilet or container. Do not let the pour spout touch any surface. 6. Put rubbing alcohol on a gauze pad or cotton ball. 7. Use the gauze pad or cotton ball to clean the pour spout. 8. Close the pour spout. 9. Attach the bag to your leg with tape or a leg strap. 10. Wash your hands.  Changing a drainage bag Supplies Needed  Alcohol wipes.  A clean drainage bag.  Adhesive tape or a leg strap.  Steps 1. Wash your hands with soap and water. 2. Separate the dirty bag from your leg. 3. Pinch the rubber catheter with your fingers so that pee does not spill out. 4. Separate the catheter tube from the drainage tube where these tubes connect (at the connection valve). Do not let the tubes touch any surface. 5. Clean the end of the catheter tube with an alcohol wipe. Use a different alcohol wipe to clean the end of the drainage tube. 6. Connect the catheter tube to the drainage tube of the clean bag. 7. Attach the new bag to  the leg with adhesive tape or a leg strap. 8. Wash your hands.  How to prevent infection and other problems  Never pull on your catheter or try to remove it. Pulling can damage tissue in your body.  Always wash your hands before and after touching your catheter.  If a leg strap gets wet, replace it with a dry one.  Drink enough fluids to keep your pee clear or pale yellow, or as told by your doctor.  Do not let the drainage bag or tubing touch the floor.  Wear cotton underwear.  If you are male, wipe from front to back after you poop (have a bowel movement).  Check on the catheter often to make sure it works and the tubing is not twisted. Get help if:  Your pee is cloudy.  Your pee smells unusually bad.  Your pee is not draining into the bag.  Your  tube gets clogged.  Your catheter starts to leak.  Your bladder feels full. Get help right away if:  You have redness, swelling, or pain where the catheter enters your body.  You have fluid, pus, or a bad smell coming from the area where the catheter enters your body.  The area where the catheter enters your body feels warm.  You have a fever.  You have pain in your: ? Stomach (abdomen). ? Legs. ? Lower back. ? Bladder.  You see blood fill the catheter.  Your pee is pink or red.  You feel sick to your stomach (nauseous).  You throw up (vomit).  You have chills.  Your catheter gets pulled out. This information is not intended to replace advice given to you by your health care provider. Make sure you discuss any questions you have with your health care provider. Document Released: 07/02/2012 Document Revised: 02/03/2016 Document Reviewed: 08/20/2013 Elsevier Interactive Patient Education  2018 [employer].   Activity:  You are encouraged to ambulate frequently (about every hour during waking hours) to help prevent blood clots from forming in your legs or lungs.  However, you should not engage in any heavy lifting (> 5-10 lbs), strenuous activity, or straining.   Diet: You should advance your diet as instructed by your physician.  It will be normal to have some bloating, nausea, and abdominal discomfort intermittently.   Prescriptions:  You will be provided a prescription for pain medication to take as needed.  If your pain is not severe enough to require the prescription pain medication, you may take extra strength Tylenol instead which will have less side effects.  You should also take a prescribed stool softener to avoid straining with bowel movements as the prescription pain medication may constipate you.   Incisions: You may remove your dressing bandages 48 hours after surgery if not removed in the hospital.  You will either have some small staples or special tissue  glue at each of the incision sites. Once the bandages are removed (if present), the incisions may stay open to air.  You may start showering (but not soaking or bathing in water) the 2nd day after surgery and the incisions simply need to be patted dry after the shower.  No additional care is needed.  What to call us about: You should call the office if you develop fever > 101 or develop persistent vomiting, redness or draining around your incision, or any other concerning symptoms.    [facility] [address] [place], [place] [address] [phone]

## 2018-02-20 NOTE — Telephone Encounter (Signed)
This patient needs to have the following postoperative appointments rescheduled as below:   1.  Follow-up on the lab visit on Thursday for drain creatinine.  Order placed today.  2.  Nurse versus PA visit Friday for drain removal pending the results  3.  Cystogram Wednesday next week with possible Foley removal on same day by nurse depending on results.  Order placed.  4.  Lab visit for PSA needs to be moved out to early January   5.  Post op with me can stay on 03/27/2017

## 2018-02-20 NOTE — Telephone Encounter (Signed)
Appointment changes made per Dr Cherrie Gauze note below & called to the unit secretary on [place] [place]. Notified secretary that these appointments replace the ones previously given to the patient. Appointments were read back for verification.

## 2018-02-21 ENCOUNTER — Other Ambulatory Visit: Payer: Self-pay | Admitting: Radiology

## 2018-02-21 DIAGNOSIS — C61 Malignant neoplasm of prostate: Secondary | ICD-10-CM | POA: Diagnosis not present

## 2018-02-21 LAB — GLUCOSE, CAPILLARY
Glucose-Capillary: 118 mg/dL — ABNORMAL HIGH (ref 70–99)
Glucose-Capillary: 130 mg/dL — ABNORMAL HIGH (ref 70–99)

## 2018-02-21 LAB — CREATININE, FLUID (PLEURAL, PERITONEAL, JP DRAINAGE): Creat, Fluid: 9.1 mg/dL

## 2018-02-21 MED FILL — Calcium Carbonate (Antacid) Chew Tab 500 MG: ORAL | Qty: 1 | Status: AC

## 2018-02-21 NOTE — Telephone Encounter (Signed)
Called patient to explain upcoming appointments requested by Dr Erlene Quan. Patient requests several of the appointments to be done in the [facility] office. Appointments & orders changed accordingly. Questions answered. Patient expresses understanding of conversation.

## 2018-02-21 NOTE — Care Management Obs Status (Signed)
[facility] [place] NOTIFICATION   Patient Details  Name: Dennis Hicks MRN: [id number] Date of Birth: 18-Apr-1955   Medicare Observation Status Notification Given:  Yes    Dustan Hyams A Betha Shadix, RN 02/21/2018, 12:19 AM

## 2018-02-21 NOTE — Telephone Encounter (Signed)
Per Dr Erlene Quan, change lab for creatinine to 02/26/2018 to be collected by our office & given to the lab at [facility] & cancel drain removal appointment until creatinine result is available.  LMOM to return call to discuss this.

## 2018-02-21 NOTE — Progress Notes (Signed)
02/21/2018 2:53 PM  Mclane [name] to be D/C'd home per MD order.  Discussed prescriptions and follow up appointments with the patient. Prescriptions given to patient, medication list explained in detail. Pt verbalized understanding.  Allergies as of 02/21/2018      Reactions   Chocolate Other (See Comments)   Trigger Migraine   Shrimp [shellfish Allergy] Other (See Comments)   Trigger Migraines      Medication List    TAKE these medications   acetaminophen 500 MG tablet Commonly known as:  TYLENOL Take 1,000 mg by mouth every 6 (six) hours as needed (headache).   aspirin EC 81 MG tablet Take 324 mg by mouth daily as needed (headache).   benazepril-hydrochlorthiazide 20-25 MG tablet Commonly known as:  LOTENSIN HCT TAKE 1 TABLET DAILY   docusate sodium 100 MG capsule Commonly known as:  COLACE Take 1 capsule (100 mg total) by mouth 2 (two) times daily. Notes to patient:  Second dose tonight    famotidine 10 MG chewable tablet Commonly known as:  PEPCID AC Chew 10 mg by mouth daily as needed for heartburn.   HYDROcodone-acetaminophen 5-325 MG tablet Commonly known as:  NORCO/VICODIN Take 1 tablet by mouth daily as needed for moderate pain.   HYDROcodone-homatropine 5-1.5 MG/5ML syrup Commonly known as:  HYCODAN Take 5 mLs by mouth every 8 (eight) hours as needed for cough.   metFORMIN 500 MG 24 hr tablet Commonly known as:  GLUCOPHAGE-XR Take 1 tablet (500 mg total) by mouth daily with breakfast.   oxybutynin 5 MG tablet Commonly known as:  DITROPAN Take 1 tablet (5 mg total) by mouth every 8 (eight) hours as needed for bladder spasms.   oxyCODONE-acetaminophen 5-325 MG tablet Commonly known as:  PERCOCET/ROXICET Take 1-2 tablets by mouth every 4 (four) hours as needed for moderate pain or severe pain.   sildenafil 100 MG tablet Commonly known as:  VIAGRA TAKE 1 TABLET AS NEEDED FOR ERECTILE DYSFUNCTION   simvastatin 40 MG tablet Commonly known as:   ZOCOR Take 1 tablet (40 mg total) by mouth daily.   SUMAtriptan 100 MG tablet Commonly known as:  IMITREX TAKE 1 TABLET BY MOUTH EVERY 2 HOURS AS NEEDED, MAX OF 2 TABLETS IN 24 HOURS       Vitals:   02/21/18 0543 02/21/18 1331  BP: 122/72 113/64  Pulse: 90 96  Resp: 18 18  Temp: 98.6 F (37 C) 98.7 F (37.1 C)  SpO2: 91% 94%    Skin clean, dry and intact without evidence of skin break down, no evidence of skin tears noted. IV catheter discontinued intact. Site without signs and symptoms of complications. Dressing and pressure applied. Pt denies pain at this time. No complaints noted.  An After Visit Summary was printed and given to the patient. Patient escorted via [place], and D/C home via private auto.  Fuller Mandril, RN

## 2018-02-22 ENCOUNTER — Encounter: Payer: Self-pay | Admitting: Radiology

## 2018-02-22 ENCOUNTER — Encounter: Payer: Self-pay | Admitting: Urology

## 2018-02-22 ENCOUNTER — Other Ambulatory Visit: Payer: 59

## 2018-02-22 ENCOUNTER — Other Ambulatory Visit: Payer: Self-pay

## 2018-02-22 ENCOUNTER — Other Ambulatory Visit: Payer: Self-pay | Admitting: Pathology

## 2018-02-22 DIAGNOSIS — R310 Gross hematuria: Secondary | ICD-10-CM

## 2018-02-22 LAB — SURGICAL PATHOLOGY

## 2018-02-23 ENCOUNTER — Ambulatory Visit: Payer: 59

## 2018-02-23 ENCOUNTER — Other Ambulatory Visit: Payer: Self-pay | Admitting: Urology

## 2018-02-23 ENCOUNTER — Encounter: Payer: Self-pay | Admitting: Urology

## 2018-02-23 ENCOUNTER — Ambulatory Visit: Payer: 59 | Admitting: Urology

## 2018-02-26 ENCOUNTER — Ambulatory Visit: Payer: 59

## 2018-02-26 ENCOUNTER — Encounter: Payer: Self-pay | Admitting: Urology

## 2018-02-26 ENCOUNTER — Other Ambulatory Visit
Admission: RE | Admit: 2018-02-26 | Discharge: 2018-02-26 | Disposition: A | Discharge status: Home / Self Care | Payer: 59 | Attending: Urology | Admitting: Urology

## 2018-02-26 ENCOUNTER — Ambulatory Visit (INDEPENDENT_AMBULATORY_CARE_PROVIDER_SITE_OTHER): Payer: 59 | Admitting: Urology

## 2018-02-26 DIAGNOSIS — C61 Malignant neoplasm of prostate: Secondary | ICD-10-CM

## 2018-02-26 LAB — CREATININE, FLUID (PLEURAL, PERITONEAL, JP DRAINAGE): Creat, Fluid: 8.5 mg/dL

## 2018-02-26 NOTE — Progress Notes (Signed)
Patient in office today to give a sample.  Sample collected and patient sent to lab for drop off.

## 2018-02-27 ENCOUNTER — Encounter: Payer: Self-pay | Admitting: Urology

## 2018-02-27 ENCOUNTER — Telehealth: Payer: Self-pay | Admitting: Urology

## 2018-02-27 ENCOUNTER — Ambulatory Visit: Payer: 59 | Admitting: Urology

## 2018-02-27 ENCOUNTER — Ambulatory Visit (INDEPENDENT_AMBULATORY_CARE_PROVIDER_SITE_OTHER): Payer: 59 | Admitting: Urology

## 2018-02-27 ENCOUNTER — Other Ambulatory Visit: Payer: Self-pay

## 2018-02-27 VITALS — BP 125/83 | HR 97 | Ht 67.0 in | Wt 209.0 lb

## 2018-02-27 DIAGNOSIS — C61 Malignant neoplasm of prostate: Secondary | ICD-10-CM | POA: Diagnosis not present

## 2018-02-27 MED ORDER — OXYCODONE-ACETAMINOPHEN 5-325 MG PO TABS: 1.0000 | ORAL_TABLET | ORAL | 0 refills | Status: DC | PRN

## 2018-02-27 NOTE — Progress Notes (Signed)
02/27/2018 4:47 PM   Funkley 1955-07-29 [id number]  Referring provider: Jinny Sanders, MD [address] [place], [place] [address]  Chief Complaint  Patient presents with  . Follow-up    HPI: 62 year old male who returns today for postop visit.  He status post radical robotic prostatectomy on 02/19/2018 with bilateral pelvic lymph node dissection.  Notably, the plane between his prostate and bladder was difficult with a small cystotomy's.  Postoperative course was complicated by a small urine leak managed with an indwelling JP drain.  Drain creatinine was rechecked 12 4 and yesterday, 12 9 and remains elevated.  Outputs have been relatively low, less than 100 cc/day.  Foley catheter remains in place.  He is overall doing well.  His pain is improving.  He has most of his pain in the right lateral most incision site.  No drainage or redness around the drain sites.  No issues with his catheter.  Surgical pathology is available today and reviewed with the patient.  Pathology consistent with Gleason 4+5 prostate cancer, pT3a with involvement of his bladder neck, negative margins, positive extracapsular extension, negative seminal vesicles, negative lymph nodes (total of 21 bilaterally).     PMH: Past Medical History:  Diagnosis Date  . Borderline diabetes   . Bronchitis   . Diabetes mellitus without complication ([place])   . Headache   . Heartburn   . HLD (hyperlipidemia)   . Hypertension   . Melanoma ([place]) 2018   Right chest area resected  . Pneumonia    x2 Aug. 2017  . Psoriasis   . Renal insufficiency     Surgical History: Past Surgical History:  Procedure Laterality Date  . FINGER GANGLION CYST EXCISION    . PELVIC LYMPH NODE DISSECTION Bilateral 02/19/2018   Procedure: PELVIC LYMPH NODE DISSECTION;  Surgeon: Hollice Espy, MD;  Location: [facility] ORS;  Service: Urology;  Laterality: Bilateral;  . ROBOT ASSISTED LAPAROSCOPIC RADICAL PROSTATECTOMY N/A  02/19/2018   Procedure: ROBOTIC ASSISTED LAPAROSCOPIC RADICAL PROSTATECTOMY;  Surgeon: Hollice Espy, MD;  Location: [facility] ORS;  Service: Urology;  Laterality: N/A;    Home Medications:  Allergies as of 02/27/2018      Reactions   Chocolate Other (See Comments)   Trigger Migraine   Ibuprofen Other (See Comments)   Kidney issues   Shrimp [shellfish Allergy] Other (See Comments)   Trigger Migraines      Medication List        Accurate as of 02/27/18  4:47 PM. Always use your most recent med list.          acetaminophen 500 MG tablet Commonly known as:  TYLENOL Take 1,000 mg by mouth every 6 (six) hours as needed (headache).   aspirin EC 81 MG tablet Take 324 mg by mouth daily as needed (headache).   benazepril-hydrochlorthiazide 20-25 MG tablet Commonly known as:  LOTENSIN HCT TAKE 1 TABLET DAILY   docusate sodium 100 MG capsule Commonly known as:  COLACE Take 1 capsule (100 mg total) by mouth 2 (two) times daily.   famotidine 10 MG chewable tablet Commonly known as:  PEPCID AC Chew 10 mg by mouth daily as needed for heartburn.   HYDROcodone-acetaminophen 5-325 MG tablet Commonly known as:  NORCO/VICODIN Take 1 tablet by mouth daily as needed for moderate pain.   HYDROcodone-homatropine 5-1.5 MG/5ML syrup Commonly known as:  HYCODAN Take 5 mLs by mouth every 8 (eight) hours as needed for cough.   metFORMIN 500 MG 24 hr  tablet Commonly known as:  GLUCOPHAGE-XR Take 1 tablet (500 mg total) by mouth daily with breakfast.   oxybutynin 5 MG tablet Commonly known as:  DITROPAN Take 1 tablet (5 mg total) by mouth every 8 (eight) hours as needed for bladder spasms.   oxyCODONE-acetaminophen 5-325 MG tablet Commonly known as:  PERCOCET/ROXICET Take 1-2 tablets by mouth every 4 (four) hours as needed for moderate pain or severe pain.   sildenafil 100 MG tablet Commonly known as:  VIAGRA TAKE 1 TABLET AS NEEDED FOR ERECTILE DYSFUNCTION   simvastatin 40 MG  tablet Commonly known as:  ZOCOR Take 1 tablet (40 mg total) by mouth daily.   SUMAtriptan 100 MG tablet Commonly known as:  IMITREX TAKE 1 TABLET BY MOUTH EVERY 2 HOURS AS NEEDED, MAX OF 2 TABLETS IN 24 HOURS       Allergies:  Allergies  Allergen Reactions  . Chocolate Other (See Comments)    Trigger Migraine  . Ibuprofen Other (See Comments)    Kidney issues  . Shrimp [Shellfish Allergy] Other (See Comments)    Trigger Migraines    Family History: Family History  Problem Relation Age of Onset  . Hypertension Mother   . Coronary artery disease Father   . Hypertension Father   . Hyperlipidemia Father   . Diabetes Father   . Prostate cancer Neg Hx   . Kidney cancer Neg Hx   . Bladder Cancer Neg Hx     Social History:  reports that he has never smoked. He has never used smokeless tobacco. He reports that he drinks alcohol. He reports that he has current or past drug history.  ROS: UROLOGY Frequent Urination?: No Hard to postpone urination?: No Burning/pain with urination?: No Get up at night to urinate?: No Leakage of urine?: No Urine stream starts and stops?: No Trouble starting stream?: No Do you have to strain to urinate?: No Blood in urine?: No Urinary tract infection?: No Sexually transmitted disease?: No Injury to kidneys or bladder?: No Painful intercourse?: No Weak stream?: No Erection problems?: No Penile pain?: No  Gastrointestinal Nausea?: No Vomiting?: No Indigestion/heartburn?: No Diarrhea?: No Constipation?: Yes  Constitutional Fever: No Night sweats?: No Weight loss?: No Fatigue?: No  Skin Skin rash/lesions?: Yes Itching?: No  Eyes Blurred vision?: No Double vision?: No  Ears/Nose/Throat Sore throat?: No Sinus problems?: No  Hematologic/Lymphatic Swollen glands?: No Easy bruising?: No  Cardiovascular Leg swelling?: No Chest pain?: No  Respiratory Cough?: Yes Shortness of breath?: No  Endocrine Excessive  thirst?: No  Musculoskeletal Back pain?: No Joint pain?: No  Neurological Headaches?: No Dizziness?: No  Psychologic Depression?: No Anxiety?: No  Physical Exam: BP 125/83   Pulse 97   Ht 5\' 7"  (1.702 m)   Wt 209 lb (94.8 kg)   BMI 32.73 kg/m   Constitutional:  Alert and oriented, No acute distress.  Completed by wife today. HEENT: [place] AT, moist mucus membranes.  Trachea midline, no masses. Cardiovascular: No clubbing, cyanosis, or edema. Respiratory: Normal respiratory effort, no increased work of breathing. GI: Abdomen is soft, wounds clean dry and intact with a small bruising around each of the incision sites.  JP draining scant serosanguineous fluid. GU: Foley catheter in place draining clear yellow urine Skin: No rashes, bruises or suspicious lesions. Neurologic: Grossly intact, no focal deficits, moving all 4 extremities. Psychiatric: Normal mood and affect.  Laboratory Data: Lab Results  Component Value Date   WBC 13.5 (H) 02/20/2018   HGB 11.9 (L) 02/20/2018  HCT 36.4 (L) 02/20/2018   MCV 90.8 02/20/2018   PLT 256 02/20/2018    Lab Results  Component Value Date   CREATININE 1.36 (H) 02/20/2018    Lab Results  Component Value Date   PSA 6.29 (H) 11/26/2015   PSA 5.34 (H) 03/10/2015   PSA 3.76 03/03/2014    Lab Results  Component Value Date   HGBA1C 6.9 (H) 12/07/2017    Urinalysis NA  Pertinent Imaging: NA   Assessment & Plan:    1. Prostate cancer [facility]) Pathology reviewed with patient, high risk with bladder neck involvement, negative margins We discussed the role of adjuvant radiation after he is healed postoperatively as long as his PSA remains undetectable We will plan for PSA 1 month as previously planned Ongoing small urine leak, will maintain JP drain but place it off of suction in order not to potentiate ongoing leak, increase amount in Foley balloon to 30 cc to help promote healing BMP today to treat his serum creatinine is at  baseline We will plan for drain creatinine Friday, cystogram prior to Foley removal - Basic metabolic panel   Return for come back Friday to [place] .  Hollice Espy, MD  [facility] [address], [place], [place] [address] [phone]

## 2018-02-27 NOTE — Telephone Encounter (Signed)
I would like to have him come in today this afternoon to adjust his drain and Foley and for wound check.  Please arrange.    Hollice Espy, MD

## 2018-02-27 NOTE — Telephone Encounter (Signed)
LMOM to notify patient of appointment with Dr Erlene Quan today at 1:30. Also called wife, Claiborne Billings, who states she is unable to bring patient until 4:00 today & patient is unable to drive. Appointment time changed to 4:00, ok per Dr Erlene Quan.

## 2018-02-28 ENCOUNTER — Encounter: Payer: Self-pay | Admitting: Urology

## 2018-02-28 ENCOUNTER — Ambulatory Visit: Admit: 2018-02-28 | Payer: 59

## 2018-03-01 ENCOUNTER — Other Ambulatory Visit: Payer: 59

## 2018-03-01 ENCOUNTER — Ambulatory Visit: Payer: 59

## 2018-03-01 LAB — BASIC METABOLIC PANEL WITH GFR
BUN/Creatinine Ratio: 11 (ref 10–24)
BUN: 14 mg/dL (ref 8–27)
CO2: 19 mmol/L — ABNORMAL LOW (ref 20–29)
Calcium: 9.2 mg/dL (ref 8.6–10.2)
Chloride: 94 mmol/L — ABNORMAL LOW (ref 96–106)
Creatinine, Ser: 1.24 mg/dL (ref 0.76–1.27)
GFR calc Af Amer: 72 mL/min/1.73
GFR calc non Af Amer: 62 mL/min/1.73
Glucose: 104 mg/dL — ABNORMAL HIGH (ref 65–99)
Potassium: 3.9 mmol/L (ref 3.5–5.2)
Sodium: 133 mmol/L — ABNORMAL LOW (ref 134–144)

## 2018-03-01 NOTE — Progress Notes (Signed)
Tumor Board Documentation  Dennis Hicks was presented by Dr Erlene Quan at our Tumor Board on 03/01/2018, which included representatives from medical oncology, radiation oncology, surgical oncology, internal medicine, navigation, radiology, surgical, research, pulmonology.  Dennis Hicks currently presents as a current patient with history of the following treatments: surgical intervention(s).  Additionally, we reviewed previous medical and familial history, history of present illness, and recent lab results along with all available histopathologic and imaging studies. The tumor board considered available treatment options and made the following recommendations: Radiation therapy (primary modality), Refer to Radiation Oncology    The following procedures/referrals were also placed: No orders of the defined types were placed in this encounter.   Clinical Trial Status: not discussed   Staging used: AJCC Stage Group pT3  pN0  National site-specific guidelines NCCN were discussed with respect to the case.  Tumor board is a meeting of clinicians from various specialty areas who evaluate and discuss patients for whom a multidisciplinary approach is being considered. Final determinations in the plan of care are those of the provider(s). The responsibility for follow up of recommendations given during tumor board is that of the provider.   Today's extended care, comprehensive team conference, Dennis Hicks was not present for the discussion and was not examined.   Multidisciplinary Tumor Board is a multidisciplinary case peer review process.  Decisions discussed in the Multidisciplinary Tumor Board reflect the opinions of the specialists present at the conference without having examined the patient.  Ultimately, treatment and diagnostic decisions rest with the primary provider(s) and the patient.

## 2018-03-02 ENCOUNTER — Ambulatory Visit: Payer: 59

## 2018-03-02 ENCOUNTER — Other Ambulatory Visit: Payer: Self-pay

## 2018-03-02 ENCOUNTER — Other Ambulatory Visit
Admission: RE | Admit: 2018-03-02 | Discharge: 2018-03-02 | Disposition: A | Discharge status: Home / Self Care | Payer: 59 | Attending: Urology | Admitting: Urology

## 2018-03-02 ENCOUNTER — Ambulatory Visit (INDEPENDENT_AMBULATORY_CARE_PROVIDER_SITE_OTHER): Payer: 59 | Admitting: Urology

## 2018-03-02 ENCOUNTER — Ambulatory Visit: Payer: 59 | Admitting: Physical Therapy

## 2018-03-02 ENCOUNTER — Encounter: Payer: Self-pay | Admitting: Urology

## 2018-03-02 VITALS — BP 128/69 | HR 100 | Wt 210.0 lb

## 2018-03-02 DIAGNOSIS — C61 Malignant neoplasm of prostate: Secondary | ICD-10-CM | POA: Insufficient documentation

## 2018-03-02 DIAGNOSIS — R310 Gross hematuria: Secondary | ICD-10-CM

## 2018-03-02 MED ORDER — HYDROCODONE-ACETAMINOPHEN 5-325 MG PO TABS: 1.0000 | ORAL_TABLET | Freq: Every day | ORAL | 0 refills | Status: DC | PRN

## 2018-03-02 NOTE — Progress Notes (Addendum)
03/02/2018 12:51 PM   Dennis Hicks Dennis Hicks [birth year] [id number]  Referring provider: Jinny Sanders, MD [address] [place], [place] [address]  Chief Complaint  Patient presents with  . Follow-up    HPI: 62 year old male status post prostatectomy who returns today again for follow-up.  He was last seen in the office on Tuesday.  At that time, additional fluid was added to his Foley balloon in his drain was taken off suction.  He continues to have low drain output, approximately 30 cc/day on gravity only.  He accidentally pulled his drain yesterday and had some discomfort at the drain site but this has resolved.  Blood work was checked earlier this week, creatinine at his baseline 1.2.  Overall, he continues to improve.  His pain is improving.  He primarily complains today of hoarseness which is worse in the afternoon as well as coughing in the afternoon and evenings.  He is taking Mucinex.  He is requesting a cough suppressant.  He also had an episode of left anterior thigh numbness after sleeping on his left side.  This lasted less than a day and has completely resolved.  No lower extremity weakness or further numbness.   PMH: Past Medical History:  Diagnosis Date  . Borderline diabetes   . Bronchitis   . Diabetes mellitus without complication ([place])   . Headache   . Heartburn   . HLD (hyperlipidemia)   . Hypertension   . Melanoma ([place]) 2018   Right chest area resected  . Pneumonia    x2 Aug. 2017  . Psoriasis   . Renal insufficiency     Surgical History: Past Surgical History:  Procedure Laterality Date  . FINGER GANGLION CYST EXCISION    . PELVIC LYMPH NODE DISSECTION Bilateral 02/19/2018   Procedure: PELVIC LYMPH NODE DISSECTION;  Surgeon: Hollice Espy, MD;  Location: [facility] ORS;  Service: Urology;  Laterality: Bilateral;  . ROBOT ASSISTED LAPAROSCOPIC RADICAL PROSTATECTOMY N/A 02/19/2018   Procedure: ROBOTIC ASSISTED LAPAROSCOPIC RADICAL PROSTATECTOMY;   Surgeon: Hollice Espy, MD;  Location: [facility] ORS;  Service: Urology;  Laterality: N/A;    Home Medications:  Allergies as of 03/02/2018      Reactions   Chocolate Other (See Comments)   Trigger Migraine   Ibuprofen Other (See Comments)   Kidney issues   Shrimp [shellfish Allergy] Other (See Comments)   Trigger Migraines      Medication List       Accurate as of March 02, 2018 12:51 PM. Always use your most recent med list.        acetaminophen 500 MG tablet Commonly known as:  TYLENOL Take 1,000 mg by mouth every 6 (six) hours as needed (headache).   aspirin EC 81 MG tablet Take 324 mg by mouth daily as needed (headache).   benazepril-hydrochlorthiazide 20-25 MG tablet Commonly known as:  LOTENSIN HCT TAKE 1 TABLET DAILY   docusate sodium 100 MG capsule Commonly known as:  COLACE Take 1 capsule (100 mg total) by mouth 2 (two) times daily.   famotidine 10 MG chewable tablet Commonly known as:  PEPCID AC Chew 10 mg by mouth daily as needed for heartburn.   HYDROcodone-acetaminophen 5-325 MG tablet Commonly known as:  NORCO/VICODIN Take 1 tablet by mouth daily as needed for moderate pain.   HYDROcodone-homatropine 5-1.5 MG/5ML syrup Commonly known as:  HYCODAN Take 5 mLs by mouth every 8 (eight) hours as needed for cough.   metFORMIN 500 MG 24 hr tablet Commonly  known as:  GLUCOPHAGE-XR Take 1 tablet (500 mg total) by mouth daily with breakfast.   oxybutynin 5 MG tablet Commonly known as:  DITROPAN Take 1 tablet (5 mg total) by mouth every 8 (eight) hours as needed for bladder spasms.   oxyCODONE-acetaminophen 5-325 MG tablet Commonly known as:  PERCOCET Take 1-2 tablets by mouth every 4 (four) hours as needed for moderate pain or severe pain.   sildenafil 100 MG tablet Commonly known as:  VIAGRA TAKE 1 TABLET AS NEEDED FOR ERECTILE DYSFUNCTION   simvastatin 40 MG tablet Commonly known as:  ZOCOR Take 1 tablet (40 mg total) by mouth daily.     SUMAtriptan 100 MG tablet Commonly known as:  IMITREX TAKE 1 TABLET BY MOUTH EVERY 2 HOURS AS NEEDED, MAX OF 2 TABLETS IN 24 HOURS       Allergies:  Allergies  Allergen Reactions  . Chocolate Other (See Comments)    Trigger Migraine  . Ibuprofen Other (See Comments)    Kidney issues  . Shrimp [Shellfish Allergy] Other (See Comments)    Trigger Migraines    Family History: Family History  Problem Relation Age of Onset  . Hypertension Mother   . Coronary artery disease Father   . Hypertension Father   . Hyperlipidemia Father   . Diabetes Father   . Prostate cancer Neg Hx   . Kidney cancer Neg Hx   . Bladder Cancer Neg Hx     Social History:  reports that he has never smoked. He has never used smokeless tobacco. He reports current alcohol use. He reports previous drug use.  ROS: UROLOGY Frequent Urination?: No Hard to postpone urination?: No Burning/pain with urination?: No Get up at night to urinate?: No Leakage of urine?: No Urine stream starts and stops?: No Trouble starting stream?: No Do you have to strain to urinate?: No Blood in urine?: No Urinary tract infection?: No Sexually transmitted disease?: No Injury to kidneys or bladder?: No Painful intercourse?: No Weak stream?: No Erection problems?: No Penile pain?: No  Gastrointestinal Nausea?: No Vomiting?: No Indigestion/heartburn?: No Diarrhea?: No Constipation?: No  Constitutional Fever: No Night sweats?: No Weight loss?: No Fatigue?: No  Skin Skin rash/lesions?: No Itching?: No  Eyes Blurred vision?: No Double vision?: No  Ears/Nose/Throat Sore throat?: No Sinus problems?: No  Hematologic/Lymphatic Swollen glands?: No Easy bruising?: No  Cardiovascular Leg swelling?: No Chest pain?: No  Respiratory Cough?: No Shortness of breath?: No  Endocrine Excessive thirst?: No  Musculoskeletal Back pain?: No Joint pain?: No  Neurological Headaches?: No Dizziness?:  No  Psychologic Depression?: No Anxiety?: No  Physical Exam: BP 128/69   Pulse 100   Wt 210 lb (95.3 kg)   BMI 32.89 kg/m   Constitutional:  Alert and oriented, No acute distress.  Accompanied by wife today. HEENT: [place] AT, moist mucus membranes.  Trachea midline, no masses. Cardiovascular: No clubbing, cyanosis, or edema. Respiratory: Normal respiratory effort, no increased work of breathing. GI: Abdomen is soft, nontender, nondistended, no abdominal masses, wounds healing well.  JP drain in place draining scant serosanguineous fluid. GU: Foley draining clear yellow urine Skin: No rashes, bruises or suspicious lesions. Neurologic: Grossly intact, no focal deficits, moving all 4 extremities. Psychiatric: Normal mood and affect.  Laboratory Data: Lab Results  Component Value Date   WBC 13.5 (H) 02/20/2018   HGB 11.9 (L) 02/20/2018   HCT 36.4 (L) 02/20/2018   MCV 90.8 02/20/2018   PLT 256 02/20/2018    Lab Results  Component Value Date   CREATININE 1.24 02/27/2018   Assessment & Plan:    1. Prostate cancer [facility]) Status post prostatectomy with persistent bladder neck leak Drain creatinine sent again today Once his drain creatinine normalizes, will remove the drain followed by cystogram with catheter removal Warning symptoms reviewed again with the patient in detail He was given 4 additional tablets of narcotic today but declined to fill cough suppressant as I would like him to continue to cough to get up any production - Creatinine, fluid (pleural, peritoneal, JP Drainage); Future  Hollice Espy, MD  [facility] [address], [place], [place] [address] [phone]

## 2018-03-03 ENCOUNTER — Encounter: Payer: Self-pay | Admitting: Urology

## 2018-03-05 ENCOUNTER — Encounter: Payer: Self-pay | Admitting: Urology

## 2018-03-05 LAB — CREATININE, FLUID (PLEURAL, PERITONEAL, JP DRAINAGE): Creat, Fluid: >50 mg/dL

## 2018-03-06 ENCOUNTER — Other Ambulatory Visit: Payer: Self-pay | Admitting: Urology

## 2018-03-06 ENCOUNTER — Encounter: Payer: Self-pay | Admitting: Urology

## 2018-03-06 DIAGNOSIS — C61 Malignant neoplasm of prostate: Secondary | ICD-10-CM

## 2018-03-07 ENCOUNTER — Encounter: Payer: Self-pay | Admitting: Urology

## 2018-03-08 ENCOUNTER — Other Ambulatory Visit
Admission: RE | Admit: 2018-03-08 | Discharge: 2018-03-08 | Disposition: A | Discharge status: Home / Self Care | Payer: 59 | Attending: Urology | Admitting: Urology

## 2018-03-08 ENCOUNTER — Telehealth: Payer: Self-pay

## 2018-03-08 DIAGNOSIS — C61 Malignant neoplasm of prostate: Secondary | ICD-10-CM | POA: Diagnosis present

## 2018-03-08 LAB — CREATININE, FLUID (PLEURAL, PERITONEAL, JP DRAINAGE): Creat, Fluid: >50 mg/dL

## 2018-03-08 MED ORDER — CEPHALEXIN 500 MG PO CAPS: 500.0000 mg | ORAL_CAPSULE | Freq: Four times a day (QID) | ORAL | 0 refills | Status: DC

## 2018-03-08 NOTE — Telephone Encounter (Signed)
Spoke with patient in regards to Dennis Hicks and issues with collecting JP drain fluid at [facility] lab. Patient was able to collect the fluid himself for lab to send off. Patient states that he felt much discomfort last night and had to squeeze the bulb and 154ml of urine like fluid came out.  He states that the drain sight is also causing discomfort. Per Dr. Erlene Quan patient to start Keflex to prevent infection and follow up tomorrow at the [facility] office for further evaluation and discuss fluid creatinine results. Patient verbalized agreement with this plan

## 2018-03-09 ENCOUNTER — Ambulatory Visit (INDEPENDENT_AMBULATORY_CARE_PROVIDER_SITE_OTHER): Payer: 59 | Admitting: Urology

## 2018-03-09 ENCOUNTER — Encounter: Payer: Self-pay | Admitting: Urology

## 2018-03-09 VITALS — BP 116/78 | HR 115 | Ht 68.0 in | Wt 210.0 lb

## 2018-03-09 DIAGNOSIS — C61 Malignant neoplasm of prostate: Secondary | ICD-10-CM

## 2018-03-09 NOTE — Progress Notes (Signed)
03/09/2018 10:43 AM   Kasandra Knudsen [name] 1955/09/02 [id number]  Referring provider: Jinny Sanders, MD [address] [place], [place] [address]  Chief Complaint  Patient presents with  . Post-op Problem    follow up on drain and foley    HPI: 62 year old male status post prostatectomy returns again for routine follow-up with ongoing anastomotic leak.  Please see previous notes for details.  Patient had a repeat drain creatinine earlier this week which remains elevated.  He reports that he kept his drain off of suction with less than 30 cc/day.  On Tuesday, he started having increasing pelvic discomfort in place his drain back on suction.  On Tuesday, his total drain output was disease on suction, on Wednesday 120 cc, and 90 on Thursday.,  He is kept his drain off suction and only has had 12 cc so far.  He also reports that several days ago he had episode of lower abdominal pain with associated leaking around the catheter and a slight greenish tinge discharge.  The pain resolved thereafter.  He also noted some redness and induration around his left drain site.  He was placed on Keflex for presumed possible cellulitis around the drain and his pain, redness, and swelling around the drain has resolved.  He continues on Keflex currently.  He continues to be anxious about having the drain and catheter removed.  Overall, his pain is improving.  He is having regular daily bowel movements.  He is no longer taking narcotics.  No nausea or vomiting.  No fevers or chills.  PMH: Past Medical History:  Diagnosis Date  . Borderline diabetes   . Bronchitis   . Diabetes mellitus without complication ([place])   . Headache   . Heartburn   . HLD (hyperlipidemia)   . Hypertension   . Melanoma ([place]) 2018   Right chest area resected  . Pneumonia    x2 Aug. 2017  . Psoriasis   . Renal insufficiency     Surgical History: Past Surgical History:  Procedure Laterality Date  . FINGER  GANGLION CYST EXCISION    . PELVIC LYMPH NODE DISSECTION Bilateral 02/19/2018   Procedure: PELVIC LYMPH NODE DISSECTION;  Surgeon: Hollice Espy, MD;  Location: [facility] ORS;  Service: Urology;  Laterality: Bilateral;  . ROBOT ASSISTED LAPAROSCOPIC RADICAL PROSTATECTOMY N/A 02/19/2018   Procedure: ROBOTIC ASSISTED LAPAROSCOPIC RADICAL PROSTATECTOMY;  Surgeon: Hollice Espy, MD;  Location: [facility] ORS;  Service: Urology;  Laterality: N/A;    Home Medications:  Allergies as of 03/09/2018      Reactions   Chocolate Other (See Comments)   Trigger Migraine   Ibuprofen Other (See Comments)   Kidney issues   Shrimp [shellfish Allergy] Other (See Comments)   Trigger Migraines      Medication List       Accurate as of March 09, 2018 10:43 AM. Always use your most recent med list.        acetaminophen 500 MG tablet Commonly known as:  TYLENOL Take 1,000 mg by mouth every 6 (six) hours as needed (headache).   aspirin EC 81 MG tablet Take 324 mg by mouth daily as needed (headache).   benazepril-hydrochlorthiazide 20-25 MG tablet Commonly known as:  LOTENSIN HCT TAKE 1 TABLET DAILY   cephALEXin 500 MG capsule Commonly known as:  KEFLEX Take 1 capsule (500 mg total) by mouth 4 (four) times daily.   docusate sodium 100 MG capsule Commonly known as:  COLACE Take 1 capsule (100 mg  total) by mouth 2 (two) times daily.   famotidine 10 MG chewable tablet Commonly known as:  PEPCID AC Chew 10 mg by mouth daily as needed for heartburn.   HYDROcodone-acetaminophen 5-325 MG tablet Commonly known as:  NORCO/VICODIN Take 1 tablet by mouth daily as needed for moderate pain.   HYDROcodone-homatropine 5-1.5 MG/5ML syrup Commonly known as:  HYCODAN Take 5 mLs by mouth every 8 (eight) hours as needed for cough.   metFORMIN 500 MG 24 hr tablet Commonly known as:  GLUCOPHAGE-XR Take 1 tablet (500 mg total) by mouth daily with breakfast.   oxybutynin 5 MG tablet Commonly known as:   DITROPAN Take 1 tablet (5 mg total) by mouth every 8 (eight) hours as needed for bladder spasms.   oxyCODONE-acetaminophen 5-325 MG tablet Commonly known as:  PERCOCET Take 1-2 tablets by mouth every 4 (four) hours as needed for moderate pain or severe pain.   sildenafil 100 MG tablet Commonly known as:  VIAGRA TAKE 1 TABLET AS NEEDED FOR ERECTILE DYSFUNCTION   simvastatin 40 MG tablet Commonly known as:  ZOCOR Take 1 tablet (40 mg total) by mouth daily.   SUMAtriptan 100 MG tablet Commonly known as:  IMITREX TAKE 1 TABLET BY MOUTH EVERY 2 HOURS AS NEEDED, MAX OF 2 TABLETS IN 24 HOURS       Allergies:  Allergies  Allergen Reactions  . Chocolate Other (See Comments)    Trigger Migraine  . Ibuprofen Other (See Comments)    Kidney issues  . Shrimp [Shellfish Allergy] Other (See Comments)    Trigger Migraines    Family History: Family History  Problem Relation Age of Onset  . Hypertension Mother   . Coronary artery disease Father   . Hypertension Father   . Hyperlipidemia Father   . Diabetes Father   . Prostate cancer Neg Hx   . Kidney cancer Neg Hx   . Bladder Cancer Neg Hx     Social History:  reports that he has never smoked. He has never used smokeless tobacco. He reports current alcohol use. He reports previous drug use.  ROS: UROLOGY Frequent Urination?: No Hard to postpone urination?: No Burning/pain with urination?: No Get up at night to urinate?: No Leakage of urine?: No Urine stream starts and stops?: No Trouble starting stream?: No Do you have to strain to urinate?: No Blood in urine?: No Urinary tract infection?: No Sexually transmitted disease?: No Injury to kidneys or bladder?: No Painful intercourse?: No Weak stream?: No Erection problems?: No Penile pain?: No  Gastrointestinal Nausea?: No Vomiting?: No Indigestion/heartburn?: No Diarrhea?: No Constipation?: No  Constitutional Fever: No Night sweats?: No Weight loss?:  No Fatigue?: No  Skin Skin rash/lesions?: No Itching?: No  Eyes Blurred vision?: No Double vision?: No  Ears/Nose/Throat Sore throat?: No Sinus problems?: No  Hematologic/Lymphatic Swollen glands?: No Easy bruising?: No  Cardiovascular Leg swelling?: No Chest pain?: No  Respiratory Cough?: No Shortness of breath?: No  Endocrine Excessive thirst?: No  Musculoskeletal Back pain?: No Joint pain?: No  Neurological Headaches?: No Dizziness?: No  Psychologic Depression?: No Anxiety?: No  Physical Exam: BP 116/78   Pulse (!) 115   Ht 5\' 8"  (1.727 m)   Wt 210 lb (95.3 kg)   BMI 31.93 kg/m   Constitutional:  Alert and oriented, No acute distress.  Company by wife today. HEENT: [place] AT, moist mucus membranes.  Trachea midline, no masses. Cardiovascular: No clubbing, cyanosis, or edema. Respiratory: Normal respiratory effort, no increased work of  breathing. GI: Abdomen is soft, nontender, nondistended, no abdominal masses, wounds healing well.  JP drain in place draining sanguinous fluid.  No significant induration or purulence around the drain site. GU: Foley in place secured to left thigh Skin: No rashes, bruises or suspicious lesions. Neurologic: Grossly intact, no focal deficits, moving all 4 extremities. Psychiatric: Normal mood and affect.  Laboratory Data: Lab Results  Component Value Date   CREATININE 1.24 02/27/2018    Assessment & Plan:    1.  Prostate cancer High risk with bladder neck invasion Will be due for PSA in January Will need adjuvant radiation down the road once his anastomosis is healed as long as his PSA remains undetectable or low  2.  Anastomotic leak Ongoing bladder leak with elevated drain creatinine Relatively low drain outputs which is reassuring (see scanned media) Continue to encourage high-protein diet to encourage healing We will leave the drain for additional 2 weeks, repeat drain creatinine at that point (Jan 2nd) I  recommended intermittent suction to drain excessive fluid but not continuously potentiate the leak Patient was reassured, leak will eventually heal with bladder drainage Patient is agreeable this plan Warning symptoms reviewed   Hollice Espy, MD  [facility] [address], [place], [place] [address] [phone]

## 2018-03-12 ENCOUNTER — Encounter: Payer: Self-pay | Admitting: Urology

## 2018-03-19 ENCOUNTER — Other Ambulatory Visit: Payer: 59

## 2018-03-22 ENCOUNTER — Other Ambulatory Visit
Admission: RE | Admit: 2018-03-22 | Discharge: 2018-03-22 | Disposition: A | Discharge status: Home / Self Care | Payer: 59 | Attending: Urology | Admitting: Urology

## 2018-03-22 DIAGNOSIS — C61 Malignant neoplasm of prostate: Secondary | ICD-10-CM | POA: Diagnosis present

## 2018-03-22 LAB — CREATININE, FLUID (PLEURAL, PERITONEAL, JP DRAINAGE): Creat, Fluid: 1.6 mg/dL

## 2018-03-23 ENCOUNTER — Other Ambulatory Visit: Payer: Self-pay

## 2018-03-23 ENCOUNTER — Encounter: Payer: Self-pay | Admitting: Urology

## 2018-03-23 ENCOUNTER — Other Ambulatory Visit: Payer: 59

## 2018-03-23 DIAGNOSIS — S3720XD Unspecified injury of bladder, subsequent encounter: Secondary | ICD-10-CM

## 2018-03-23 DIAGNOSIS — C61 Malignant neoplasm of prostate: Secondary | ICD-10-CM

## 2018-03-26 ENCOUNTER — Encounter: Payer: Self-pay | Admitting: Urology

## 2018-03-26 ENCOUNTER — Ambulatory Visit (INDEPENDENT_AMBULATORY_CARE_PROVIDER_SITE_OTHER): Payer: 59 | Admitting: Urology

## 2018-03-26 ENCOUNTER — Ambulatory Visit
Admission: RE | Admit: 2018-03-26 | Discharge: 2018-03-26 | Disposition: A | Discharge status: Home / Self Care | Payer: 59 | Source: Ambulatory Visit | Attending: Urology | Admitting: Urology

## 2018-03-26 VITALS — BP 138/81 | HR 112 | Ht 67.0 in | Wt 209.0 lb

## 2018-03-26 DIAGNOSIS — X58XXXD Exposure to other specified factors, subsequent encounter: Secondary | ICD-10-CM | POA: Insufficient documentation

## 2018-03-26 DIAGNOSIS — N323 Diverticulum of bladder: Secondary | ICD-10-CM | POA: Diagnosis not present

## 2018-03-26 DIAGNOSIS — C61 Malignant neoplasm of prostate: Secondary | ICD-10-CM

## 2018-03-26 DIAGNOSIS — S3720XD Unspecified injury of bladder, subsequent encounter: Secondary | ICD-10-CM

## 2018-03-26 NOTE — Progress Notes (Signed)
03/26/2018 4:36 PM   [place] 02/09/1956 [id number]  Referring provider: Jinny Sanders, MD [address] [place], [place] [address]  Chief Complaint  Patient presents with  . Routine Post Op    4 week postop    HPI: 63 year old male status post radical prostatectomy on 02/19/2018 who returns the office today for Foley catheter removal/drain removal.   He reports that over the past 2 weeks, he has had little to no drainage output from his JP.  He has been doing otherwise well.  No complaints today.  He has been working remotely and is anxious to get back to work.  His postoperative course has been prolonged secondary to a small leak from the anastomosis with elevated drain creatinine.  As of 3 days ago, his drain creatinine has normalized.  Cystogram today showed no evidence of anastomotic leak.  He is anxious to have both his Foley and drain removed today.  Surgical pathology consistent with high risk Gleason 4+5 prostate cancer with invasion of the bladder neck, extraprostatic extension, negative margins, negative lymph nodes, no seminal vesicle involvement.  pT3a N0 MX.   PMH: Past Medical History:  Diagnosis Date  . Borderline diabetes   . Bronchitis   . Diabetes mellitus without complication ([place])   . Headache   . Heartburn   . HLD (hyperlipidemia)   . Hypertension   . Melanoma ([place]) 2018   Right chest area resected  . Pneumonia    x2 Aug. 2017  . Psoriasis   . Renal insufficiency     Surgical History: Past Surgical History:  Procedure Laterality Date  . FINGER GANGLION CYST EXCISION    . PELVIC LYMPH NODE DISSECTION Bilateral 02/19/2018   Procedure: PELVIC LYMPH NODE DISSECTION;  Surgeon: Hollice Espy, MD;  Location: [facility] ORS;  Service: Urology;  Laterality: Bilateral;  . ROBOT ASSISTED LAPAROSCOPIC RADICAL PROSTATECTOMY N/A 02/19/2018   Procedure: ROBOTIC ASSISTED LAPAROSCOPIC RADICAL PROSTATECTOMY;  Surgeon: Hollice Espy, MD;  Location:  [facility] ORS;  Service: Urology;  Laterality: N/A;    Home Medications:  Allergies as of 03/26/2018      Reactions   Chocolate Other (See Comments)   Trigger Migraine   Ibuprofen Other (See Comments)   Kidney issues   Shrimp [shellfish Allergy] Other (See Comments)   Trigger Migraines      Medication List       Accurate as of March 26, 2018  4:36 PM. Always use your most recent med list.        acetaminophen 500 MG tablet Commonly known as:  TYLENOL Take 1,000 mg by mouth every 6 (six) hours as needed (headache).   aspirin EC 81 MG tablet Take 324 mg by mouth daily as needed (headache).   benazepril-hydrochlorthiazide 20-25 MG tablet Commonly known as:  LOTENSIN HCT TAKE 1 TABLET DAILY   cephALEXin 500 MG capsule Commonly known as:  KEFLEX Take 1 capsule (500 mg total) by mouth 4 (four) times daily.   docusate sodium 100 MG capsule Commonly known as:  COLACE Take 1 capsule (100 mg total) by mouth 2 (two) times daily.   famotidine 10 MG chewable tablet Commonly known as:  PEPCID AC Chew 10 mg by mouth daily as needed for heartburn.   HYDROcodone-acetaminophen 5-325 MG tablet Commonly known as:  NORCO/VICODIN Take 1 tablet by mouth daily as needed for moderate pain.   HYDROcodone-homatropine 5-1.5 MG/5ML syrup Commonly known as:  HYCODAN Take 5 mLs by mouth every 8 (eight) hours  as needed for cough.   metFORMIN 500 MG 24 hr tablet Commonly known as:  GLUCOPHAGE-XR Take 1 tablet (500 mg total) by mouth daily with breakfast.   oxybutynin 5 MG tablet Commonly known as:  DITROPAN Take 1 tablet (5 mg total) by mouth every 8 (eight) hours as needed for bladder spasms.   oxyCODONE-acetaminophen 5-325 MG tablet Commonly known as:  PERCOCET Take 1-2 tablets by mouth every 4 (four) hours as needed for moderate pain or severe pain.   sildenafil 100 MG tablet Commonly known as:  VIAGRA TAKE 1 TABLET AS NEEDED FOR ERECTILE DYSFUNCTION   simvastatin 40 MG  tablet Commonly known as:  ZOCOR Take 1 tablet (40 mg total) by mouth daily.   SUMAtriptan 100 MG tablet Commonly known as:  IMITREX TAKE 1 TABLET BY MOUTH EVERY 2 HOURS AS NEEDED, MAX OF 2 TABLETS IN 24 HOURS       Allergies:  Allergies  Allergen Reactions  . Chocolate Other (See Comments)    Trigger Migraine  . Ibuprofen Other (See Comments)    Kidney issues  . Shrimp [Shellfish Allergy] Other (See Comments)    Trigger Migraines    Family History: Family History  Problem Relation Age of Onset  . Hypertension Mother   . Coronary artery disease Father   . Hypertension Father   . Hyperlipidemia Father   . Diabetes Father   . Prostate cancer Neg Hx   . Kidney cancer Neg Hx   . Bladder Cancer Neg Hx     Social History:  reports that he has never smoked. He has never used smokeless tobacco. He reports current alcohol use. He reports previous drug use.  ROS: UROLOGY Frequent Urination?: No Hard to postpone urination?: No Burning/pain with urination?: No Get up at night to urinate?: No Leakage of urine?: No Urine stream starts and stops?: No Trouble starting stream?: No Do you have to strain to urinate?: No Blood in urine?: No Urinary tract infection?: No Sexually transmitted disease?: No Injury to kidneys or bladder?: No Painful intercourse?: No Weak stream?: No Erection problems?: No Penile pain?: No  Gastrointestinal Nausea?: No Vomiting?: No Indigestion/heartburn?: No Diarrhea?: No Constipation?: No  Constitutional Fever: No Night sweats?: No Weight loss?: No Fatigue?: No  Skin Skin rash/lesions?: No Itching?: No  Eyes Blurred vision?: No Double vision?: No  Ears/Nose/Throat Sore throat?: No Sinus problems?: No  Hematologic/Lymphatic Swollen glands?: No Easy bruising?: No  Cardiovascular Leg swelling?: No Chest pain?: No  Respiratory Cough?: No Shortness of breath?: No  Endocrine Excessive thirst?: No  Musculoskeletal Back  pain?: No Joint pain?: No  Neurological Headaches?: No Dizziness?: No  Psychologic Depression?: No Anxiety?: No  Physical Exam: BP 138/81 (BP Location: Left Arm, Patient Position: Standing)   Pulse (!) 112   Ht 5\' 7"  (1.702 m)   Wt 209 lb (94.8 kg)   BMI 32.73 kg/m   Constitutional:  Alert and oriented, No acute distress. HEENT: [place] AT, moist mucus membranes.  Trachea midline, no masses. Cardiovascular: No clubbing, cyanosis, or edema. Respiratory: Normal respiratory effort, no increased work of breathing. GI: Abdomen is soft, nontender, wounds clean dry and intact.  Drain with scant serous fluid. GU: Normal phallus with demonstrable stress incontinence Skin: No rashes, bruises or suspicious lesions. Neurologic: Grossly intact, no focal deficits, moving all 4 extremities. Psychiatric: Normal mood and affect.  Laboratory Data: Drain creatinine 1.6 on 03/22/2018  Pertinent Imaging: CLINICAL DATA:  History prostate cancer, posterior bladder leak.  EXAM: CYSTOGRAM  TECHNIQUE: After catheterization of the urinary bladder following sterile technique the bladder was filled with 50 mL Cysto-Conray by drip infusion. Serial spot images were obtained during bladder filling and post draining.  FLUOROSCOPY TIME:  Fluoroscopy Time:  37 seconds  Radiation Exposure Index (if provided by the fluoroscopic device): 11.7 mGy  Number of Acquired Spot Images: 0  COMPARISON:  None.  FINDINGS: Initial scout image demonstrates surgical drain in the pelvis. The overlying bowel gas pattern is normal. The regional skeleton is unremarkable.  A Foley catheter was inserted into the bladder and 50 ml of Cysto-Conray was instilled via gravity. The bladder is normal in capacity. No vesicoureteral reflux is identified. There are no filling defects within the bladder. There is no extraluminal contrast. There is no post void residual on post void images. There is a small right lateral  bladder diverticulum.  IMPRESSION: No extraluminal contrast to suggest a bladder leak.   Electronically Signed   By: Kathreen Devoid   On: 03/26/2018 11:30  Assessment & Plan:    1. Prostate cancer ([place]) Leak has resolved, cystogram negative today Drain creatinine is normalized Foley catheter and drain removed today without difficulty Discussed anticipation of severe stress incontinence which will improve Okay to resume pelvic floor exercises PSA drawn today We will plan for follow-up with PSA in 3 months Patient will need adjuvant XRT, will hold off to allow for anastomotic healing - PSA; Future - PSA  Return in about 3 months (around 06/25/2018) for PSA prior to visit.  Hollice Espy, MD  [facility] [address], [place],  [address] [phone]

## 2018-03-27 ENCOUNTER — Other Ambulatory Visit: Payer: 59

## 2018-03-27 ENCOUNTER — Telehealth: Payer: Self-pay | Admitting: Family Medicine

## 2018-03-27 ENCOUNTER — Ambulatory Visit: Payer: 59 | Admitting: Urology

## 2018-03-27 ENCOUNTER — Telehealth: Payer: Self-pay

## 2018-03-27 ENCOUNTER — Encounter: Payer: Self-pay | Admitting: Urology

## 2018-03-27 DIAGNOSIS — N183 Chronic kidney disease, stage 3 unspecified: Secondary | ICD-10-CM

## 2018-03-27 DIAGNOSIS — E0822 Diabetes mellitus due to underlying condition with diabetic chronic kidney disease: Secondary | ICD-10-CM

## 2018-03-27 LAB — PSA: Prostate Specific Ag, Serum: <0.1 ng/mL (ref 0.0–4.0)

## 2018-03-27 NOTE — Telephone Encounter (Signed)
-----   Message from Ellamae Sia sent at 03/19/2018 10:50 AM EST ----- Regarding: Lab orders for Tuesday, 1.7.20 Lab orders for a 3 month follow up appt.

## 2018-03-27 NOTE — Telephone Encounter (Signed)
Dennis Hicks,  Can you please call and schedule [name] a follow up on diabetes with fasting labs prior for Dr. Diona Browner in 2 to 4 weeks.

## 2018-03-27 NOTE — Telephone Encounter (Signed)
Pt put in following message via mychart. Please advise:  Dennis Hicks would like to cancel the following appointments:    Dennis Sanders, MD in [place] [phone]), 03/30/2018 2:30 PM    Comments:  Dr Diona Browner,  I have canceled my appointment for Jan-10th. I think I   should probably give this at least two more weeks and then   I should be able to have this somewhat under control    lease advise a good date for you    Thanks  Dennis Hicks

## 2018-03-27 NOTE — Telephone Encounter (Signed)
I told him okay to cancel.  Please have him schedule fasting labs  Followed by Dm follow up appt with me in the next 2-4 weeks.

## 2018-03-27 NOTE — Telephone Encounter (Signed)
-----   Message from Hollice Espy, MD sent at 03/27/2018  9:21 AM EST ----- PSA is undetectable!!!!!!!  Great news.    Hollice Espy, MD

## 2018-03-29 NOTE — Telephone Encounter (Signed)
Labs 1/28 Follow appointment 1/31 Pt aware

## 2018-03-30 ENCOUNTER — Ambulatory Visit: Payer: 59 | Admitting: Family Medicine

## 2018-04-12 ENCOUNTER — Encounter: Payer: Self-pay | Admitting: Urology

## 2018-04-13 ENCOUNTER — Other Ambulatory Visit: Payer: Self-pay | Admitting: Urology

## 2018-04-13 DIAGNOSIS — C61 Malignant neoplasm of prostate: Secondary | ICD-10-CM

## 2018-04-17 ENCOUNTER — Other Ambulatory Visit (INDEPENDENT_AMBULATORY_CARE_PROVIDER_SITE_OTHER): Payer: 59

## 2018-04-17 DIAGNOSIS — N183 Chronic kidney disease, stage 3 unspecified: Secondary | ICD-10-CM

## 2018-04-17 DIAGNOSIS — E1122 Type 2 diabetes mellitus with diabetic chronic kidney disease: Secondary | ICD-10-CM

## 2018-04-17 DIAGNOSIS — E0822 Diabetes mellitus due to underlying condition with diabetic chronic kidney disease: Secondary | ICD-10-CM | POA: Diagnosis not present

## 2018-04-17 NOTE — Addendum Note (Signed)
Addended by: Ellamae Sia on: 04/17/2018 09:18 AM   Modules accepted: Orders

## 2018-04-17 NOTE — Addendum Note (Signed)
Addended by: Ellamae Sia on: 04/17/2018 09:19 AM   Modules accepted: Orders

## 2018-04-18 LAB — HEMOGLOBIN A1C
Est. average glucose Bld gHb Est-mCnc: 131 mg/dL
HEMOGLOBIN A1C: 6.2 % — AB (ref 4.8–5.6)

## 2018-04-18 LAB — COMPREHENSIVE METABOLIC PANEL
ALT: 29 IU/L (ref 0–44)
AST: 19 IU/L (ref 0–40)
Albumin/Globulin Ratio: 1.6 (ref 1.2–2.2)
Albumin: 4.3 g/dL (ref 3.8–4.8)
Alkaline Phosphatase: 74 IU/L (ref 39–117)
BUN/Creatinine Ratio: 13 (ref 10–24)
BUN: 18 mg/dL (ref 8–27)
Bilirubin Total: 0.3 mg/dL (ref 0.0–1.2)
CHLORIDE: 98 mmol/L (ref 96–106)
CO2: 26 mmol/L (ref 20–29)
Calcium: 11.3 mg/dL — ABNORMAL HIGH (ref 8.6–10.2)
Creatinine, Ser: 1.41 mg/dL — ABNORMAL HIGH (ref 0.76–1.27)
GFR calc Af Amer: 62 mL/min/{1.73_m2} (ref >59)
GFR calc non Af Amer: 53 mL/min/{1.73_m2} — ABNORMAL LOW (ref >59)
Globulin, Total: 2.7 g/dL (ref 1.5–4.5)
Glucose: 134 mg/dL — ABNORMAL HIGH (ref 65–99)
Potassium: 4.3 mmol/L (ref 3.5–5.2)
Sodium: 140 mmol/L (ref 134–144)
Total Protein: 7 g/dL (ref 6.0–8.5)

## 2018-04-18 LAB — LIPID PANEL
Chol/HDL Ratio: 4.9 ratio (ref 0.0–5.0)
Cholesterol, Total: 165 mg/dL (ref 100–199)
HDL: 34 mg/dL — ABNORMAL LOW (ref >39)
LDL Calculated: 99 mg/dL (ref 0–99)
Triglycerides: 161 mg/dL — ABNORMAL HIGH (ref 0–149)
VLDL Cholesterol Cal: 32 mg/dL (ref 5–40)

## 2018-04-19 ENCOUNTER — Ambulatory Visit
Admission: RE | Admit: 2018-04-19 | Discharge: 2018-04-19 | Disposition: A | Discharge status: Home / Self Care | Payer: 59 | Source: Ambulatory Visit | Attending: Urology | Admitting: Urology

## 2018-04-19 DIAGNOSIS — C61 Malignant neoplasm of prostate: Secondary | ICD-10-CM | POA: Diagnosis not present

## 2018-04-19 DIAGNOSIS — R200 Anesthesia of skin: Secondary | ICD-10-CM | POA: Diagnosis not present

## 2018-04-19 MED ORDER — IOPAMIDOL (ISOVUE-300) INJECTION 61%
50.0000 mL | Freq: Once | INTRAVENOUS | Status: AC | PRN
  Administered 2018-04-19: 85 mL via INTRAVENOUS

## 2018-04-20 ENCOUNTER — Ambulatory Visit: Payer: 59 | Admitting: Family Medicine

## 2018-04-20 ENCOUNTER — Encounter: Payer: Self-pay | Admitting: Urology

## 2018-04-20 ENCOUNTER — Encounter: Payer: Self-pay | Admitting: Family Medicine

## 2018-04-20 VITALS — BP 90/60 | HR 96 | Temp 98.4°F | Ht 66.5 in | Wt 210.8 lb

## 2018-04-20 DIAGNOSIS — E0822 Diabetes mellitus due to underlying condition with diabetic chronic kidney disease: Secondary | ICD-10-CM | POA: Diagnosis not present

## 2018-04-20 DIAGNOSIS — N183 Chronic kidney disease, stage 3 (moderate): Secondary | ICD-10-CM

## 2018-04-20 DIAGNOSIS — G8929 Other chronic pain: Secondary | ICD-10-CM

## 2018-04-20 DIAGNOSIS — M545 Low back pain, unspecified: Secondary | ICD-10-CM

## 2018-04-20 DIAGNOSIS — I10 Essential (primary) hypertension: Secondary | ICD-10-CM

## 2018-04-20 DIAGNOSIS — E1122 Type 2 diabetes mellitus with diabetic chronic kidney disease: Secondary | ICD-10-CM | POA: Diagnosis not present

## 2018-04-20 DIAGNOSIS — E7800 Pure hypercholesterolemia, unspecified: Secondary | ICD-10-CM | POA: Diagnosis not present

## 2018-04-20 LAB — HM DIABETES FOOT EXAM

## 2018-04-20 MED ORDER — HYDROCODONE-ACETAMINOPHEN 5-325 MG PO TABS: 1.0000 | ORAL_TABLET | Freq: Every day | ORAL | 0 refills | Status: DC | PRN

## 2018-04-20 NOTE — Assessment & Plan Note (Signed)
Diet controlled.  

## 2018-04-20 NOTE — Assessment & Plan Note (Signed)
Well controlled. Continue current medication.  

## 2018-04-20 NOTE — Assessment & Plan Note (Signed)
Worsened since surgery.. ? Decreased fluid intake, increased protein.  Re-eval in 1-2 months.

## 2018-04-20 NOTE — Assessment & Plan Note (Addendum)
Indication for chronic opioid: chronic low back pain Medication and dose: hydrocodone 5/325 # pills per month:  1 tab po daily prn..  Once a month for 3-4 day for flare Last UDS date: due Opioid Treatment Agreement signed (Y/N):Y Opioid Treatment Agreement last reviewed with patient:  Y NCCSRS reviewed this encounter (include red flags):   no red flags, reviewed 04/20/2018.  #30 lasts 12 months.

## 2018-04-20 NOTE — Patient Instructions (Addendum)
Work on getting back to exercise.  Keep working on healthy eating. Increase fluids.  Return for calcium and kidney function check labs in 1-2 months. Stop Tums.  Get yearly eye exam when able.

## 2018-04-20 NOTE — Progress Notes (Signed)
   Subjective:    Patient ID: Dennis Hicks, male    DOB: 02/07/1956, 63 y.o.   MRN: [id number]  HPI    63 year old  Male presents for follow up.  S/P recent prostate cancer.. radical prostatectomy  Diabetes:   Good control on no medication. Lab Results  Component Value Date   HGBA1C 6.2 (H) 04/17/2018  Using medications without difficulties: Hypoglycemic episodes:? Hyperglycemic episodes:? Feet problems: no ulcers Blood Sugars averaging: eye exam within last year: due   Elevated Cholesterol:  LDL at goal < 100 Lab Results  Component Value Date   CHOL 165 04/17/2018   HDL 34 (L) 04/17/2018   LDLCALC 99 04/17/2018   LDLDIRECT 158.2 01/03/2013   TRIG 161 (H) 04/17/2018   CHOLHDL 4.9 04/17/2018  Using medications without problems: Muscle aches:  Diet compliance: moderate Exercise: getting exercsie Other complaints:   CKD: Slight decrease in GFR and Cr... post op compared to 1 month ago. He feels that he has decreased fluids.  He has not been taking ibuprofen.   Hypercalcemia.. he has been using a lot of TUms.  Indication for chronic opioid: chronic low back pain Medication and dose: hydrocodone 5/325 # pills per month:  1 tab po daily prn..  Once a month for 3-4 day for flare Last UDS date: due Opioid Treatment Agreement signed (Y/N):Y Opioid Treatment Agreement last reviewed with patient:  Y NCCSRS reviewed this encounter (include red flags):   no red flags, reviewed 04/20/2018.  Social History /Family History/Past Medical History reviewed in detail and updated in EMR if needed. Blood pressure 90/60, pulse 96, temperature 98.4 F (36.9 C), temperature source Oral, height 5' 6.5" (1.689 m), weight 210 lb 12 oz (95.6 kg), SpO2 97 %.   Review of Systems  Constitutional: Negative for fatigue and fever.  HENT: Negative for ear pain.   Eyes: Negative for pain.  Respiratory: Negative for cough and shortness of breath.   Cardiovascular: Negative for chest pain,  palpitations and leg swelling.  Gastrointestinal: Negative for abdominal pain.  Genitourinary: Negative for dysuria.  Musculoskeletal: Negative for arthralgias.  Neurological: Negative for syncope, light-headedness and headaches.  Psychiatric/Behavioral: Negative for dysphoric mood.       Objective:   Physical Exam Constitutional:      Appearance: He is well-developed.  HENT:     Head: Normocephalic.     Right Ear: Hearing normal.     Left Ear: Hearing normal.     Nose: Nose normal.  Neck:     Thyroid: No thyroid mass or thyromegaly.     Vascular: No carotid bruit.     Trachea: Trachea normal.  Cardiovascular:     Rate and Rhythm: Normal rate and regular rhythm.     Pulses: Normal pulses.     Heart sounds: Heart sounds not distant. No murmur. No friction rub. No gallop.      Comments: No peripheral edema Pulmonary:     Effort: Pulmonary effort is normal. No respiratory distress.     Breath sounds: Normal breath sounds.  Skin:    General: Skin is warm and dry.     Findings: No rash.  Psychiatric:        Speech: Speech normal.        Behavior: Behavior normal.        Thought Content: Thought content normal.           Assessment & Plan:

## 2018-04-20 NOTE — Assessment & Plan Note (Signed)
At goal on no medication. 

## 2018-04-21 LAB — PAIN MGMT, PROFILE 8 W/CONF, U
6 Acetylmorphine: NEGATIVE ng/mL (ref <10)
Alcohol Metabolites: NEGATIVE ng/mL (ref <500)
Amphetamines: NEGATIVE ng/mL (ref <500)
Benzodiazepines: NEGATIVE ng/mL (ref <100)
Buprenorphine, Urine: NEGATIVE ng/mL (ref <5)
Cocaine Metabolite: NEGATIVE ng/mL (ref <150)
Creatinine: 108.4 mg/dL
MDMA: NEGATIVE ng/mL (ref <500)
Marijuana Metabolite: NEGATIVE ng/mL (ref <20)
OXYCODONE: NEGATIVE ng/mL (ref <100)
Opiates: NEGATIVE ng/mL (ref <100)
Oxidant: NEGATIVE ug/mL (ref <200)
pH: 6.66 (ref 4.5–9.0)

## 2018-05-05 ENCOUNTER — Other Ambulatory Visit: Payer: Self-pay | Admitting: Family Medicine

## 2018-06-19 DIAGNOSIS — C61 Malignant neoplasm of prostate: Secondary | ICD-10-CM

## 2018-06-19 DIAGNOSIS — N183 Chronic kidney disease, stage 3 (moderate): Principal | ICD-10-CM

## 2018-06-19 DIAGNOSIS — E1122 Type 2 diabetes mellitus with diabetic chronic kidney disease: Secondary | ICD-10-CM

## 2018-06-19 DIAGNOSIS — E0822 Diabetes mellitus due to underlying condition with diabetic chronic kidney disease: Secondary | ICD-10-CM

## 2018-06-19 NOTE — Telephone Encounter (Signed)
Please call pt to set up [facility] follow up and labs prior (labs this week).

## 2018-06-20 NOTE — Telephone Encounter (Signed)
Labs 4/2 Web ex 4/3 Pt aware

## 2018-06-21 ENCOUNTER — Other Ambulatory Visit: Payer: Self-pay

## 2018-06-21 ENCOUNTER — Other Ambulatory Visit (INDEPENDENT_AMBULATORY_CARE_PROVIDER_SITE_OTHER): Payer: 59

## 2018-06-21 DIAGNOSIS — N183 Chronic kidney disease, stage 3 unspecified: Secondary | ICD-10-CM

## 2018-06-21 DIAGNOSIS — C61 Malignant neoplasm of prostate: Secondary | ICD-10-CM | POA: Diagnosis not present

## 2018-06-21 DIAGNOSIS — E0822 Diabetes mellitus due to underlying condition with diabetic chronic kidney disease: Secondary | ICD-10-CM

## 2018-06-21 NOTE — Addendum Note (Signed)
Addended by: Ellamae Sia on: 06/21/2018 08:32 AM   Modules accepted: Orders

## 2018-06-22 ENCOUNTER — Encounter: Payer: Self-pay | Admitting: Family Medicine

## 2018-06-22 ENCOUNTER — Other Ambulatory Visit: Payer: Self-pay

## 2018-06-22 ENCOUNTER — Ambulatory Visit (INDEPENDENT_AMBULATORY_CARE_PROVIDER_SITE_OTHER): Payer: 59 | Admitting: Family Medicine

## 2018-06-22 DIAGNOSIS — M545 Low back pain, unspecified: Secondary | ICD-10-CM

## 2018-06-22 DIAGNOSIS — E0822 Diabetes mellitus due to underlying condition with diabetic chronic kidney disease: Secondary | ICD-10-CM

## 2018-06-22 DIAGNOSIS — G8929 Other chronic pain: Secondary | ICD-10-CM

## 2018-06-22 DIAGNOSIS — I10 Essential (primary) hypertension: Secondary | ICD-10-CM

## 2018-06-22 DIAGNOSIS — E7800 Pure hypercholesterolemia, unspecified: Secondary | ICD-10-CM

## 2018-06-22 DIAGNOSIS — C61 Malignant neoplasm of prostate: Secondary | ICD-10-CM | POA: Diagnosis not present

## 2018-06-22 DIAGNOSIS — N183 Chronic kidney disease, stage 3 unspecified: Secondary | ICD-10-CM

## 2018-06-22 DIAGNOSIS — E1122 Type 2 diabetes mellitus with diabetic chronic kidney disease: Secondary | ICD-10-CM

## 2018-06-22 LAB — COMPREHENSIVE METABOLIC PANEL
ALT: 24 IU/L (ref 0–44)
AST: 20 IU/L (ref 0–40)
Albumin/Globulin Ratio: 1.9 (ref 1.2–2.2)
Albumin: 4.3 g/dL (ref 3.8–4.8)
Alkaline Phosphatase: 69 IU/L (ref 39–117)
BUN/Creatinine Ratio: 11 (ref 10–24)
BUN: 15 mg/dL (ref 8–27)
Bilirubin Total: 0.3 mg/dL (ref 0.0–1.2)
CO2: 25 mmol/L (ref 20–29)
Calcium: 9.3 mg/dL (ref 8.6–10.2)
Chloride: 100 mmol/L (ref 96–106)
Creatinine, Ser: 1.31 mg/dL — ABNORMAL HIGH (ref 0.76–1.27)
GFR calc Af Amer: 67 mL/min/{1.73_m2} (ref >59)
GFR calc non Af Amer: 58 mL/min/{1.73_m2} — ABNORMAL LOW (ref >59)
Globulin, Total: 2.3 g/dL (ref 1.5–4.5)
Glucose: 132 mg/dL — ABNORMAL HIGH (ref 65–99)
Potassium: 4.8 mmol/L (ref 3.5–5.2)
Sodium: 141 mmol/L (ref 134–144)
Total Protein: 6.6 g/dL (ref 6.0–8.5)

## 2018-06-22 LAB — LIPID PANEL
Chol/HDL Ratio: 5.7 ratio — ABNORMAL HIGH (ref 0.0–5.0)
Cholesterol, Total: 166 mg/dL (ref 100–199)
HDL: 29 mg/dL — ABNORMAL LOW (ref >39)
LDL Calculated: 109 mg/dL — ABNORMAL HIGH (ref 0–99)
Triglycerides: 139 mg/dL (ref 0–149)
VLDL Cholesterol Cal: 28 mg/dL (ref 5–40)

## 2018-06-22 LAB — PSA: Prostate Specific Ag, Serum: <0.1 ng/mL (ref 0.0–4.0)

## 2018-06-22 LAB — HEMOGLOBIN A1C
Est. average glucose Bld gHb Est-mCnc: 157 mg/dL
Hgb A1c MFr Bld: 7.1 % — ABNORMAL HIGH (ref 4.8–5.6)

## 2018-06-22 NOTE — Assessment & Plan Note (Signed)
Well controlled. Continue current medication.  

## 2018-06-22 NOTE — Assessment & Plan Note (Signed)
Minimal use of narcotic. No red flags. No refills needed.

## 2018-06-22 NOTE — Patient Instructions (Addendum)
It was nice speaking to you today! Work on increasing exercise and decreasing carbohydrate. Schedule  yearly eye xam.

## 2018-06-22 NOTE — Assessment & Plan Note (Signed)
Slightly worsened control in last 3 months given decreased activity and inacessibility of noncarbs for diet.  Encouraged exercise, weight loss, healthy eating habits.   Re-eval in 3 months if not at goal .. start med to treat.

## 2018-06-22 NOTE — Progress Notes (Signed)
VIRTUAL VISIT Due to national recommendations of social distancing due to [place] 19, a virtual visit is felt to be most appropriate for this patient at this time.   I connected with@ on 06/22/18 at  2:30 PM EDT by [facility] and verified that I am speaking with the correct person using two identifiers.   I discussed the limitations, risks, security and privacy concerns of performing an evaluation and management service by [facility] and the availability of in person appointments. I also discussed with the patient that there may be a patient responsible charge related to this service. The patient expressed understanding and agreed to proceed.  Patient location: Home Provider Location: [facility] Participants: Eliezer Lofts and Charlynne Pander   Chief Complaint  Patient presents with  . Follow-up    Labs    History of Present Illness: S/P recent prostate cancer.. radical prostatectomy... function decrease but gradually getting better  Diabetes:   Good control on no medication but worse from 3 months ago.  he less able to be active. Diet slightly higher in carbs Lab Results  Component Value Date   HGBA1C 7.1 (H) 06/21/2018       Using medications without difficulties: Hypoglycemic episodes:? Hyperglycemic episodes:? Feet problems: no ulcers Blood Sugars averaging: ? eye exam within last year: due   Elevated Cholesterol:  LDL no longer at goal on simvastatin 40 mg. Lab Results  Component Value Date   CHOL 166 06/21/2018   HDL 29 (L) 06/21/2018   LDLCALC 109 (H) 06/21/2018   LDLDIRECT 158.2 01/03/2013   TRIG 139 06/21/2018   CHOLHDL 5.7 (H) 06/21/2018       Using medications without problems: Muscle aches:  Diet compliance: moderate Exercise: minimal Other complaints:  Hypertension:  Bp well controlled at last check.  BP Readings from Last 3 Encounters:  04/20/18 90/60  03/26/18 138/81  03/09/18 116/78    Using medication without problems or lightheadedness:  none Chest pain with exertion:none Edema:none Short of breath:none Average home BPs: ?    CKD: Slight decrease in GFR and Cr... post op compared to 1 month ago. He feels that he has decreased fluids.  He has not been taking ibuprofen.   Hypercalcemia.. he had been using a lot of TUms... resolved now  Indication for chronic opioid: chronic low back pain Medication and dose: hydrocodone 5/325 # pills per month:  1 tab po daily prn..  Once a month for 3-4 day for flare Last UDS date: 04/20/2018 Opioid Treatment Agreement signed (Y/N):Y Opioid Treatment Agreement last reviewed with patient:  Y NCCSRS reviewed this encounter (include red flags):   no red flags, reviewed 06/22/2018.   COVID 19 screen No recent travel or known exposure to COVID19 The patient denies respiratory symptoms of COVID 19 at this time.  The importance of social distancing was discussed today.   Review of Systems  Constitutional: Negative for fever.  HENT: Negative for ear pain.   Eyes: Negative for pain.  Respiratory: Negative for cough.   Cardiovascular: Negative for chest pain.  Gastrointestinal: Negative for vomiting.  Genitourinary: Negative for dysuria.  Musculoskeletal: Negative for myalgias.  Skin: Negative for rash.  Psychiatric/Behavioral: Negative for depression.      Past Medical History:  Diagnosis Date  . Borderline diabetes   . Bronchitis   . Diabetes mellitus without complication ([place])   . Headache   . Heartburn   . HLD (hyperlipidemia)   . Hypertension   . Melanoma ([place]) 2018   Right  chest area resected  . Pneumonia    x2 Aug. 2017  . Psoriasis   . Renal insufficiency     reports that he has never smoked. He has never used smokeless tobacco. He reports current alcohol use. He reports previous drug use.   Current Outpatient Medications:  .  acetaminophen (TYLENOL) 500 MG tablet, Take 1,000 mg by mouth every 6 (six) hours as needed (headache)., Disp: , Rfl:  .  aspirin EC 81 MG  tablet, Take 324 mg by mouth daily as needed (headache)., Disp: , Rfl:  .  benazepril-hydrochlorthiazide (LOTENSIN HCT) 20-25 MG tablet, TAKE 1 TABLET DAILY, Disp: 90 tablet, Rfl: 3 .  famotidine (PEPCID AC) 10 MG chewable tablet, Chew 10 mg by mouth daily as needed for heartburn. , Disp: , Rfl:  .  HYDROcodone-acetaminophen (NORCO/VICODIN) 5-325 MG tablet, Take 1 tablet by mouth daily as needed for moderate pain., Disp: 30 tablet, Rfl: 0 .  metFORMIN (GLUCOPHAGE-XR) 500 MG 24 hr tablet, Take 1 tablet (500 mg total) by mouth daily with breakfast., Disp: 90 tablet, Rfl: 3 .  oxybutynin (DITROPAN) 5 MG tablet, Take 1 tablet (5 mg total) by mouth every 8 (eight) hours as needed for bladder spasms., Disp: 30 tablet, Rfl: 0 .  sildenafil (VIAGRA) 100 MG tablet, TAKE 1 TABLET AS NEEDED FOR ERECTILE DYSFUNCTION, Disp: 36 tablet, Rfl: 0 .  simvastatin (ZOCOR) 40 MG tablet, Take 1 tablet (40 mg total) by mouth daily., Disp: 90 tablet, Rfl: 3 .  SUMAtriptan (IMITREX) 100 MG tablet, TAKE 1 TABLET BY MOUTH EVERY 2 HOURS AS NEEDED, MAX OF 2 TABLETS IN 24 HOURS, Disp: 10 tablet, Rfl: 0   Observations/Objective: Pulse 75, height 5' 6.5" (1.689 m), SpO2 97 %.  Physical Exam  Physical Exam Constitutional:      General: She is not in acute distress. Pulmonary:     Effort: Pulmonary effort is normal. No respiratory distress.  Neurological:     Mental Status: She is alert and oriented to person, place, and time.  Psychiatric:        Mood and Affect: Mood normal.        Behavior: Behavior normal.   Assessment and Plan   ,Diabetes mellitus due to underlying condition with renal complication (HCC)  Slightly worsened control in last 3 months given decreased activity and inacessibility of noncarbs for diet.  Encouraged exercise, weight loss, healthy eating habits.   Re-eval in 3 months if not at goal .. start med to treat.  Benign hypertension Well controlled. Continue current medication.   Chronic low  back pain Minimal use of narcotic. No red flags. No refills needed.  Prostate cancer [facility]) S/p resection.  PSA undetectable.  CKD stage 3 due to type 2 diabetes mellitus ([place]) At least in part due to DM. Stable.  HYPERCHOLESTEROLEMIA Well controlled. Continue current medication.  ON statin   I discussed the assessment and treatment plan with the patient. The patient was provided an opportunity to ask questions and all were answered. The patient agreed with the plan and demonstrated an understanding of the instructions.   The patient was advised to call back or seek an in-person evaluation if the symptoms worsen or if the condition fails to improve as anticipated.     Eliezer Lofts, MD

## 2018-06-22 NOTE — Assessment & Plan Note (Signed)
At least in part due to DM. Stable.

## 2018-06-22 NOTE — Assessment & Plan Note (Signed)
S/p resection.  PSA undetectable.

## 2018-06-22 NOTE — Assessment & Plan Note (Signed)
Well controlled. Continue current medication. ON statin. 

## 2018-06-25 ENCOUNTER — Telehealth (INDEPENDENT_AMBULATORY_CARE_PROVIDER_SITE_OTHER): Payer: 59 | Admitting: Urology

## 2018-06-25 ENCOUNTER — Other Ambulatory Visit: Payer: Self-pay

## 2018-06-25 DIAGNOSIS — N393 Stress incontinence (female) (male): Secondary | ICD-10-CM

## 2018-06-25 DIAGNOSIS — N5231 Erectile dysfunction following radical prostatectomy: Secondary | ICD-10-CM

## 2018-06-25 DIAGNOSIS — C61 Malignant neoplasm of prostate: Secondary | ICD-10-CM | POA: Diagnosis not present

## 2018-06-25 MED ORDER — SILDENAFIL CITRATE 20 MG PO TABS: 20.0000 mg | ORAL_TABLET | ORAL | 11 refills | Status: DC | PRN

## 2018-06-25 NOTE — Progress Notes (Signed)
Virtual Visit via Video Note  I connected with Dennis Hicks on 06/25/18 at 10:00 AM EDT by a video enabled telemedicine application and verified that I am speaking with the correct person using two identifiers.   I discussed the limitations of evaluation and management by telemedicine and the availability of in person appointments. The patient expressed understanding and agreed to proceed.  History of Present Illness: 63 year old male with history of high risk prostate cancer status post prostatectomy 02/2018 he returns today via telemedicine visit with video chat to discuss his ongoing recovery and progress.  Labs drawn by his PCP last week indicate that his PSA remains undetectable.   His postoperative course was complicated by prolonged anastomotic leak requiring prolonged drain and Foley which ultimately healed.  Surgical pathology consistent with high risk Gleason 4+5 prostate cancer with invasion of the bladder neck, extraprostatic extension, negative margins, negative lymph nodes, no seminal vesicle involvement.  pT3a N0 MX.  In terms of continence, he did see some fairly marked improvement which is leveled off.  Because he is active during the day at work, he wears a Cunningham type clamp which keeps him dry.  At nighttime, he is wearing a pouch/Velcro device.  Between these 2, he is difficult time assessing his degree of leakage but he suspect it would be fairly significant if you are not to wear these.  He does not like wearing diapers/pads.  At nighttime, he does have some moisture in his pouch but no overt or significant leakage.  In terms of sexual function, he is not had any erections postop.  He is interested in pursuing further intervention at this time.  All wounds are healing well.  The numbness in his thigh has completely resolved.  He has no other post operative issues at this time.  Observations/Objective: Present today via video chat, appears visibly  well.  Assessment and Plan:  1. Prostate cancer ([place]) High risk prostate cancer with bladder neck involvement status post prostatectomy with undetectable PSA  We discussed again today that I would recommend adjuvant radiation following prostatectomy given the presence of primarily bladder neck involvement.  That being said, he continues to have what sounds ongoing issues with incontiencne .  At this time,  we will hold off for on radiation for the time being to allow  For more recovery of his continence in the setting of undetectable PSA.  Plan for repeat PSA in 3 months and reassess at this time. - PSA; Future  2. Stress incontinence of urine Ongoing stress incontinence managed with clamp during day incontinence device during night Continue to encourage pelvic floor exercises which he has not been doing regularly as well as weight loss Allow for more time for recovery as above  3. Erectile dysfunction following radical prostatectomy We discussed trial of sildenafil, discussed possible side effects and how best to administer the medication with regard to timing and dosage We also discussed that there is a high likelihood that this may not be effective, at which time we would offer him injection teaching.  He will let us know if he like to pursue this.   Follow Up Instructions: Follow-up in 3 months with PSA prior   I discussed the assessment and treatment plan with the patient. The patient was provided an opportunity to ask questions and all were answered. The patient agreed with the plan and demonstrated an understanding of the instructions.   The patient was advised to call back or seek an in-person evaluation  if the symptoms worsen or if the condition fails to improve as anticipated.  I provided 15 minutes of non-face-to-face time during this encounter.   Hollice Espy, MD

## 2018-06-27 ENCOUNTER — Other Ambulatory Visit: Payer: 59

## 2018-06-27 ENCOUNTER — Ambulatory Visit: Payer: 59 | Admitting: Urology

## 2018-07-10 DIAGNOSIS — L578 Other skin changes due to chronic exposure to nonionizing radiation: Secondary | ICD-10-CM | POA: Diagnosis not present

## 2018-07-10 DIAGNOSIS — Z1283 Encounter for screening for malignant neoplasm of skin: Secondary | ICD-10-CM | POA: Diagnosis not present

## 2018-07-10 DIAGNOSIS — Z85820 Personal history of malignant melanoma of skin: Secondary | ICD-10-CM | POA: Diagnosis not present

## 2018-07-10 DIAGNOSIS — L820 Inflamed seborrheic keratosis: Secondary | ICD-10-CM | POA: Diagnosis not present

## 2018-07-30 ENCOUNTER — Other Ambulatory Visit: Payer: Self-pay

## 2018-07-30 DIAGNOSIS — G8929 Other chronic pain: Secondary | ICD-10-CM

## 2018-07-30 DIAGNOSIS — M545 Low back pain, unspecified: Secondary | ICD-10-CM

## 2018-07-30 NOTE — Telephone Encounter (Signed)
Last office visit 06/22/2018 for DM.  Last refilled 04/20/2018 for #30 with no refills.  CPE scheduled for 10/09/2018.  UDS/Contract 04/20/2018.

## 2018-07-31 MED ORDER — HYDROCODONE-ACETAMINOPHEN 5-325 MG PO TABS: 1.0000 | ORAL_TABLET | Freq: Every day | ORAL | 0 refills | Status: DC | PRN

## 2018-09-26 ENCOUNTER — Other Ambulatory Visit: Payer: 59

## 2018-09-26 ENCOUNTER — Other Ambulatory Visit: Payer: Self-pay

## 2018-09-26 DIAGNOSIS — C61 Malignant neoplasm of prostate: Secondary | ICD-10-CM

## 2018-09-27 LAB — PSA: Prostate Specific Ag, Serum: <0.1 ng/mL (ref 0.0–4.0)

## 2018-10-03 ENCOUNTER — Telehealth: Payer: Self-pay | Admitting: Family Medicine

## 2018-10-03 ENCOUNTER — Ambulatory Visit: Payer: 59 | Admitting: Urology

## 2018-10-03 DIAGNOSIS — E0822 Diabetes mellitus due to underlying condition with diabetic chronic kidney disease: Secondary | ICD-10-CM

## 2018-10-03 DIAGNOSIS — N183 Chronic kidney disease, stage 3 unspecified: Secondary | ICD-10-CM

## 2018-10-03 DIAGNOSIS — E7800 Pure hypercholesterolemia, unspecified: Secondary | ICD-10-CM

## 2018-10-03 NOTE — Telephone Encounter (Signed)
-----   Message from Cloyd Stagers, RT sent at 09/27/2018  8:59 AM EDT ----- Regarding: Lab Orders for Thursday 7.16.2020 Please place lab orders for Thursday 7.16.2020, office visit for physical on Tuesday 7.21.2020 Thank you, Dyke Maes RT(R)

## 2018-10-04 ENCOUNTER — Other Ambulatory Visit: Payer: Self-pay

## 2018-10-04 ENCOUNTER — Other Ambulatory Visit (INDEPENDENT_AMBULATORY_CARE_PROVIDER_SITE_OTHER): Payer: 59

## 2018-10-04 DIAGNOSIS — E1122 Type 2 diabetes mellitus with diabetic chronic kidney disease: Secondary | ICD-10-CM | POA: Diagnosis not present

## 2018-10-04 DIAGNOSIS — N183 Chronic kidney disease, stage 3 unspecified: Secondary | ICD-10-CM

## 2018-10-04 DIAGNOSIS — E0822 Diabetes mellitus due to underlying condition with diabetic chronic kidney disease: Secondary | ICD-10-CM

## 2018-10-04 DIAGNOSIS — E7800 Pure hypercholesterolemia, unspecified: Secondary | ICD-10-CM

## 2018-10-04 NOTE — Addendum Note (Signed)
Addended by: Ellamae Sia on: 10/04/2018 08:12 AM   Modules accepted: Orders

## 2018-10-05 LAB — HEMOGLOBIN A1C
Est. average glucose Bld gHb Est-mCnc: 146 mg/dL
Hgb A1c MFr Bld: 6.7 % — ABNORMAL HIGH (ref 4.8–5.6)

## 2018-10-05 LAB — COMPREHENSIVE METABOLIC PANEL
ALT: 29 IU/L (ref 0–44)
AST: 20 IU/L (ref 0–40)
Albumin/Globulin Ratio: 1.8 (ref 1.2–2.2)
Albumin: 4.4 g/dL (ref 3.8–4.8)
Alkaline Phosphatase: 69 IU/L (ref 39–117)
BUN/Creatinine Ratio: 13 (ref 10–24)
BUN: 17 mg/dL (ref 8–27)
Bilirubin Total: 0.3 mg/dL (ref 0.0–1.2)
CO2: 24 mmol/L (ref 20–29)
Calcium: 9.4 mg/dL (ref 8.6–10.2)
Chloride: 97 mmol/L (ref 96–106)
Creatinine, Ser: 1.31 mg/dL — ABNORMAL HIGH (ref 0.76–1.27)
GFR calc Af Amer: 67 mL/min/{1.73_m2} (ref >59)
GFR calc non Af Amer: 58 mL/min/{1.73_m2} — ABNORMAL LOW (ref >59)
Globulin, Total: 2.4 g/dL (ref 1.5–4.5)
Glucose: 129 mg/dL — ABNORMAL HIGH (ref 65–99)
Potassium: 4.5 mmol/L (ref 3.5–5.2)
Sodium: 137 mmol/L (ref 134–144)
Total Protein: 6.8 g/dL (ref 6.0–8.5)

## 2018-10-05 LAB — LIPID PANEL
Chol/HDL Ratio: 5.1 ratio — ABNORMAL HIGH (ref 0.0–5.0)
Cholesterol, Total: 164 mg/dL (ref 100–199)
HDL: 32 mg/dL — ABNORMAL LOW (ref >39)
LDL Calculated: 106 mg/dL — ABNORMAL HIGH (ref 0–99)
Triglycerides: 130 mg/dL (ref 0–149)
VLDL Cholesterol Cal: 26 mg/dL (ref 5–40)

## 2018-10-09 ENCOUNTER — Other Ambulatory Visit: Payer: Self-pay

## 2018-10-09 ENCOUNTER — Ambulatory Visit: Payer: 59 | Admitting: Urology

## 2018-10-09 ENCOUNTER — Encounter: Payer: Self-pay | Admitting: Urology

## 2018-10-09 ENCOUNTER — Encounter: Payer: Self-pay | Admitting: Family Medicine

## 2018-10-09 ENCOUNTER — Ambulatory Visit (INDEPENDENT_AMBULATORY_CARE_PROVIDER_SITE_OTHER): Payer: 59 | Admitting: Family Medicine

## 2018-10-09 VITALS — BP 106/72 | HR 97 | Ht 66.5 in | Wt 213.0 lb

## 2018-10-09 VITALS — BP 128/66 | HR 97 | Temp 98.1°F | Ht 66.5 in | Wt 213.2 lb

## 2018-10-09 DIAGNOSIS — Z0000 Encounter for general adult medical examination without abnormal findings: Secondary | ICD-10-CM

## 2018-10-09 DIAGNOSIS — N183 Chronic kidney disease, stage 3 (moderate): Secondary | ICD-10-CM

## 2018-10-09 DIAGNOSIS — M25552 Pain in left hip: Secondary | ICD-10-CM

## 2018-10-09 DIAGNOSIS — N393 Stress incontinence (female) (male): Secondary | ICD-10-CM

## 2018-10-09 DIAGNOSIS — I10 Essential (primary) hypertension: Secondary | ICD-10-CM

## 2018-10-09 DIAGNOSIS — C61 Malignant neoplasm of prostate: Secondary | ICD-10-CM | POA: Diagnosis not present

## 2018-10-09 DIAGNOSIS — N5231 Erectile dysfunction following radical prostatectomy: Secondary | ICD-10-CM | POA: Diagnosis not present

## 2018-10-09 DIAGNOSIS — E1122 Type 2 diabetes mellitus with diabetic chronic kidney disease: Secondary | ICD-10-CM

## 2018-10-09 DIAGNOSIS — E7800 Pure hypercholesterolemia, unspecified: Secondary | ICD-10-CM | POA: Diagnosis not present

## 2018-10-09 DIAGNOSIS — M545 Low back pain, unspecified: Secondary | ICD-10-CM

## 2018-10-09 DIAGNOSIS — E0822 Diabetes mellitus due to underlying condition with diabetic chronic kidney disease: Secondary | ICD-10-CM

## 2018-10-09 DIAGNOSIS — G8929 Other chronic pain: Secondary | ICD-10-CM

## 2018-10-09 DIAGNOSIS — H6993 Unspecified Eustachian tube disorder, bilateral: Secondary | ICD-10-CM

## 2018-10-09 DIAGNOSIS — Z1211 Encounter for screening for malignant neoplasm of colon: Secondary | ICD-10-CM

## 2018-10-09 DIAGNOSIS — H6983 Other specified disorders of Eustachian tube, bilateral: Secondary | ICD-10-CM

## 2018-10-09 LAB — HM DIABETES FOOT EXAM

## 2018-10-09 MED ORDER — NONFORMULARY OR COMPOUNDED ITEM: 0 refills | Status: DC

## 2018-10-09 MED ORDER — HYDROCODONE-ACETAMINOPHEN 5-325 MG PO TABS: 1.0000 | ORAL_TABLET | Freq: Every day | ORAL | 0 refills | Status: DC | PRN

## 2018-10-09 NOTE — Patient Instructions (Addendum)
Stay active and get back on track with low cholesterol  Diet and low carb diet.  Get yearly eye exam. Stop at lab to get stool cards.  Start home PT stretches.  Complete a course of prednisone.  If not improving after 2 weeks.. call.    Eustachian Tube Dysfunction  Eustachian tube dysfunction refers to a condition in which a blockage develops in the narrow passage that connects the middle ear to the back of the nose (eustachian tube). The eustachian tube regulates air pressure in the middle ear by letting air move between the ear and nose. It also helps to drain fluid from the middle ear space. Eustachian tube dysfunction can affect one or both ears. When the eustachian tube does not function properly, air pressure, fluid, or both can build up in the middle ear. What are the causes? This condition occurs when the eustachian tube becomes blocked or cannot open normally. Common causes of this condition include:  Ear infections.  Colds and other infections that affect the nose, mouth, and throat (upper respiratory tract).  Allergies.  Irritation from cigarette smoke.  Irritation from stomach acid coming up into the esophagus (gastroesophageal reflux). The esophagus is the tube that carries food from the mouth to the stomach.  Sudden changes in air pressure, such as from descending in an airplane or scuba diving.  Abnormal growths in the nose or throat, such as: ? Growths that line the nose (nasal polyps). ? Abnormal growth of cells (tumors). ? Enlarged tissue at the back of the throat (adenoids). What increases the risk? You are more likely to develop this condition if:  You smoke.  You are overweight.  You are a child who has: ? Certain birth defects of the mouth, such as cleft palate. ? Large tonsils or adenoids. What are the signs or symptoms? Common symptoms of this condition include:  A feeling of fullness in the ear.  Ear pain.  Clicking or popping noises in the  ear.  Ringing in the ear.  Hearing loss.  Loss of balance.  Dizziness. Symptoms may get worse when the air pressure around you changes, such as when you travel to an area of high elevation, fly on an airplane, or go scuba diving. How is this diagnosed? This condition may be diagnosed based on:  Your symptoms.  A physical exam of your ears, nose, and throat.  Tests, such as those that measure: ? The movement of your eardrum (tympanogram). ? Your hearing (audiometry). How is this treated? Treatment depends on the cause and severity of your condition.  In mild cases, you may relieve your symptoms by moving air into your ears. This is called "popping the ears."  In more severe cases, or if you have symptoms of fluid in your ears, treatment may include: ? Medicines to relieve congestion (decongestants). ? Medicines that treat allergies (antihistamines). ? Nasal sprays or ear drops that contain medicines that reduce swelling (steroids). ? A procedure to drain the fluid in your eardrum (myringotomy). In this procedure, a small tube is placed in the eardrum to:  Drain the fluid.  Restore the air in the middle ear space. ? A procedure to insert a balloon device through the nose to inflate the opening of the eustachian tube (balloon dilation). Follow these instructions at home: Lifestyle  Do not do any of the following until your health care provider approves: ? Travel to high altitudes. ? Fly in airplanes. ? Work in a Pension scheme manager or room. ?  Scuba dive.  Do not use any products that contain nicotine or tobacco, such as cigarettes and e-cigarettes. If you need help quitting, ask your health care provider.  Keep your ears dry. Wear fitted earplugs during showering and bathing. Dry your ears completely after. General instructions  Take over-the-counter and prescription medicines only as told by your health care provider.  Use techniques to help pop your ears as recommended  by your health care provider. These may include: ? Chewing gum. ? Yawning. ? Frequent, forceful swallowing. ? Closing your mouth, holding your nose closed, and gently blowing as if you are trying to blow air out of your nose.  Keep all follow-up visits as told by your health care provider. This is important. Contact a health care provider if:  Your symptoms do not go away after treatment.  Your symptoms come back after treatment.  You are unable to pop your ears.  You have: ? A fever. ? Pain in your ear. ? Pain in your head or neck. ? Fluid draining from your ear.  Your hearing suddenly changes.  You become very dizzy.  You lose your balance. Summary  Eustachian tube dysfunction refers to a condition in which a blockage develops in the eustachian tube.  It can be caused by ear infections, allergies, inhaled irritants, or abnormal growths in the nose or throat.  Symptoms include ear pain, hearing loss, or ringing in the ears.  Mild cases are treated with maneuvers to unblock the ears, such as yawning or ear popping.  Severe cases are treated with medicines. Surgery may also be done (rare). This information is not intended to replace advice given to you by your health care provider. Make sure you discuss any questions you have with your health care provider. Document Released: 04/03/2015 Document Revised: 06/27/2017 Document Reviewed: 06/27/2017 Elsevier Patient Education  2020 [employer].

## 2018-10-09 NOTE — Assessment & Plan Note (Signed)
Continue statin.Marland Kitchen get back on low chol diet.

## 2018-10-09 NOTE — Progress Notes (Signed)
10/09/2018 12:41 PM   Dennis Hicks [birth year] [id number]  Referring provider: Jinny Sanders, MD [address] [place],  [place] [address]  Chief Complaint  Patient presents with  . Prostate Cancer    HPI: 63 year old male with history of high risk prostate cancer status post prostatectomy 02/2018 who returns today for routine follow-up.  Most recent PSA 09/26/2018 remains undetectable.  His postoperative course was complicated by prolonged anastomotic leak requiring prolonged drain and Foley which ultimately healed.  Surgical pathology consistent with high risk Gleason 4+5 prostate cancer with invasion of the bladder neck, extraprostatic extension,negative margins, negative lymph nodes, no seminal vesicle involvement.pT3a N0 MX.  In terms of continence, he is primarily wearing a sleevelike device or a Cunningham clamp at work.  If he wears the sleeve, he has to change it once over an 8-hour shift.  He is not wearing a pad.  He feels like his improvement is leveled off over the past month.  He leaks with sneezing, and physical activity.  He is overall satisfied with his continence at this point.  In terms of sexual function, he is not had any erections postop.  He is interested in pursuing further intervention at this time.  He tried Viagra 100 mg without any response.   PMH: Past Medical History:  Diagnosis Date  . Borderline diabetes   . Bronchitis   . Diabetes mellitus without complication ([place])   . Headache   . Heartburn   . HLD (hyperlipidemia)   . Hypertension   . Melanoma ([place]) 2018   Right chest area resected  . Pneumonia    x2 Aug. 2017  . Psoriasis   . Renal insufficiency     Surgical History: Past Surgical History:  Procedure Laterality Date  . FINGER GANGLION CYST EXCISION    . PELVIC LYMPH NODE DISSECTION Bilateral 02/19/2018   Procedure: PELVIC LYMPH NODE DISSECTION;  Surgeon: Hollice Espy, MD;  Location: [facility] ORS;  Service:  Urology;  Laterality: Bilateral;  . ROBOT ASSISTED LAPAROSCOPIC RADICAL PROSTATECTOMY N/A 02/19/2018   Procedure: ROBOTIC ASSISTED LAPAROSCOPIC RADICAL PROSTATECTOMY;  Surgeon: Hollice Espy, MD;  Location: [facility] ORS;  Service: Urology;  Laterality: N/A;    Home Medications:  Allergies as of 10/09/2018      Reactions   Chocolate Other (See Comments)   Trigger Migraine   Ibuprofen Other (See Comments)   Kidney issues   Shrimp [shellfish Allergy] Other (See Comments)   Trigger Migraines      Medication List       Accurate as of October 09, 2018 12:41 PM. If you have any questions, ask your nurse or doctor.        acetaminophen 500 MG tablet Commonly known as: TYLENOL Take 1,000 mg by mouth every 6 (six) hours as needed (headache).   aspirin EC 81 MG tablet Take 324 mg by mouth daily as needed (headache).   benazepril-hydrochlorthiazide 20-25 MG tablet Commonly known as: LOTENSIN HCT TAKE 1 TABLET DAILY   famotidine 10 MG chewable tablet Commonly known as: PEPCID AC Chew 10 mg by mouth daily as needed for heartburn.   HYDROcodone-acetaminophen 5-325 MG tablet Commonly known as: NORCO/VICODIN Take 1 tablet by mouth daily as needed for moderate pain.   metFORMIN 500 MG 24 hr tablet Commonly known as: GLUCOPHAGE-XR Take 1 tablet (500 mg total) by mouth daily with breakfast.   oxybutynin 5 MG tablet Commonly known as: DITROPAN Take 1 tablet (5 mg total) by mouth every 8 (eight)  hours as needed for bladder spasms.   sildenafil 100 MG tablet Commonly known as: Viagra TAKE 1 TABLET AS NEEDED FOR ERECTILE DYSFUNCTION   sildenafil 20 MG tablet Commonly known as: Revatio Take 1 tablet (20 mg total) by mouth as needed. Take 1-5 tabs as needed prior to intercourse   simvastatin 40 MG tablet Commonly known as: ZOCOR Take 1 tablet (40 mg total) by mouth daily.   SUMAtriptan 100 MG tablet Commonly known as: IMITREX TAKE 1 TABLET BY MOUTH EVERY 2 HOURS AS NEEDED, MAX OF 2  TABLETS IN 24 HOURS       Allergies:  Allergies  Allergen Reactions  . Chocolate Other (See Comments)    Trigger Migraine  . Ibuprofen Other (See Comments)    Kidney issues  . Shrimp [Shellfish Allergy] Other (See Comments)    Trigger Migraines    Family History: Family History  Problem Relation Age of Onset  . Hypertension Mother   . Coronary artery disease Father   . Hypertension Father   . Hyperlipidemia Father   . Diabetes Father   . Prostate cancer Neg Hx   . Kidney cancer Neg Hx   . Bladder Cancer Neg Hx     Social History:  reports that he has never smoked. He has never used smokeless tobacco. He reports current alcohol use. He reports previous drug use.  ROS: UROLOGY Frequent Urination?: No Hard to postpone urination?: No Burning/pain with urination?: No Get up at night to urinate?: No Leakage of urine?: No Urine stream starts and stops?: No Trouble starting stream?: No Do you have to strain to urinate?: No Blood in urine?: No Urinary tract infection?: No Sexually transmitted disease?: No Injury to kidneys or bladder?: No Painful intercourse?: No Weak stream?: No Erection problems?: No Penile pain?: No  Gastrointestinal Nausea?: No Vomiting?: No Indigestion/heartburn?: No Diarrhea?: No Constipation?: No  Constitutional Fever: No Night sweats?: No Weight loss?: No Fatigue?: No  Skin Skin rash/lesions?: No Itching?: No  Eyes Blurred vision?: No Double vision?: No  Ears/Nose/Throat Sore throat?: No Sinus problems?: No  Hematologic/Lymphatic Swollen glands?: No Easy bruising?: No  Cardiovascular Leg swelling?: No Chest pain?: No  Respiratory Cough?: No Shortness of breath?: No  Endocrine Excessive thirst?: No  Musculoskeletal Back pain?: No Joint pain?: No  Neurological Headaches?: No Dizziness?: No  Psychologic Depression?: No Anxiety?: No  Physical Exam: BP 106/72   Pulse 97   Ht 5' 6.5" (1.689 m)   Wt 213  lb (96.6 kg)   BMI 33.86 kg/m   Constitutional:  Alert and oriented, No acute distress. HEENT: [place] AT, moist mucus membranes.  Trachea midline, no masses. Cardiovascular: No clubbing, cyanosis, or edema. Respiratory: Normal respiratory effort, no increased work of breathing. Skin: No rashes, bruises or suspicious lesions. Neurologic: Grossly intact, no focal deficits, moving all 4 extremities. Psychiatric: Normal mood and affect.   Assessment & Plan:    1. Prostate cancer ([place]) High risk prostate cancer with bladder neck involvement status post prostatectomy 02/2018  PSA remains undetectable  Continue to recommend adjuvant radiation.  Now that his urinary symptoms have stabilized, recommend pursuing this.  He is going on vacation next month and like to start thereafter.  Risk and benefits discussed.  He is agreeable this plan. - Ambulatory referral to Radiation Oncology  2. Stress incontinence of urine Stabilized, satisfied with using Cunningham clamp/continence device during physical activity  He understands that radiation could impact his continence and urinary symptoms.    Continue to  encourage weight loss and pelvic floor exercises.  3. Erectile dysfunction following radical prostatectomy Lengthy conversation today about treatment of erectile dysfunction, not currently responsive to PDE 5 inhibitors  We discussed vacuum erection device at length with his penile injection.  Not a candidate for penile prosthesis at the time but may be down the road when he is recovered from radiation.  He would like to pursue injection teaching.  We discussed our office protocol involving picking up test dose the day before his appointment and presenting for injection teaching/titration.  Risks including priapism were also discussed.  He is agreeable this plan.  He will return for this teaching.   Return for injection teaching with shannon, MD in 6 months, referral to rad onc.  Hollice Espy,  MD  [facility] [address] [place], [place] [address] [phone]  I spent 25 min with this patient of which greater than 50% was spent in counseling and coordination of care with the patient.

## 2018-10-09 NOTE — Progress Notes (Signed)
Chief Complaint  Patient presents with  . Annual Exam  . Ear Problem    C/o feeling fluid moving in right ear since beach trip.     History of Present Illness: HPI   The patient is here for annual wellness exam and preventative care.    He has also been having worsened hip pain  He has used hydrocodone more often given flare.. he feels this is due to lying differentt at night in bed due to prostate surgery.. has started improving with thinner pillow. Also has been planting in garden, digging a lot. Pain is lateral, decrease ROM in left hip.  Has less pain now.. only at night 6/10 on pain scale at times. NSADIS contraindicated.  Ear pressure  In right ear.. noted after got water in ear at beach. Sometimes on left as well.. popping. No pain, no hearing loss.  Prostate cancer: followed by urology.. s/p radical prostatectomy early 2020.  Diabetes:  Good control on metfomrin. Lab Results  Component Value Date   HGBA1C 6.7 (H) 10/04/2018  Using medications without difficulties: Hypoglycemic episodes: Hyperglycemic episodes: Feet problems: no ulcers Blood Sugars averaging: eye exam within last year: due  Wt Readings from Last 3 Encounters:  10/09/18 213 lb 4 oz (96.7 kg)  10/09/18 213 lb (96.6 kg)  04/20/18 210 lb 12 oz (95.6 kg)     Hypertension:    At goal on current regimen. ACEI BP Readings from Last 3 Encounters:  10/09/18 128/66  10/09/18 106/72  04/20/18 90/60  Using medication without problems or lightheadedness: none Chest pain with exertion:none Edema:none Short of breath:none Average home BPs: Other issues:  Elevated Cholesterol:  Almost at goal  LDL < 100 on simvastatin 40 mg daily Lab Results  Component Value Date   CHOL 164 10/04/2018   HDL 32 (L) 10/04/2018   LDLCALC 106 (H) 10/04/2018   LDLDIRECT 158.2 01/03/2013   TRIG 130 10/04/2018   CHOLHDL 5.1 (H) 10/04/2018  Using medications without problems: Muscle aches:  Diet compliance: poor  lately. Exercise: increased activity Other complaints: CKD Estimated Creatinine Clearance: 65.1 mL/min (A) (by C-G formula based on SCr of 1.31 mg/dL (H)).  Indication for chronic opioid:chronic low back pain Medication and dose:hydrocodone 5/325 # pills per month: 1 tab po daily prn.. Once a month for 3-4 day for flare usually but in last  Month he has had to use more for hip pain. Last UDS date:04/20/2018 Opioid Treatment Agreement signed (Y/N):Y Opioid Treatment Agreement last reviewed with patient:Y NCCSRS reviewed this encounter (include red flags):no red flags, reviewed 10/09/2018  COVID 19 screen No recent travel or known exposure to [place] The patient denies respiratory symptoms of COVID 19 at this time.  The importance of social distancing was discussed today.   Review of Systems  Constitutional: Negative for chills and fever.  HENT: Negative for congestion and ear pain.   Eyes: Negative for pain and redness.  Respiratory: Negative for cough and shortness of breath.   Cardiovascular: Negative for chest pain, palpitations and leg swelling.  Gastrointestinal: Negative for abdominal pain, blood in stool, constipation, diarrhea, nausea and vomiting.  Genitourinary: Negative for dysuria.       Urinary incontinence.   Musculoskeletal: Negative for falls and myalgias.  Skin: Negative for rash.  Neurological: Negative for dizziness.  Psychiatric/Behavioral: Negative for depression. The patient is not nervous/anxious.       Past Medical History:  Diagnosis Date  . Borderline diabetes   . Bronchitis   . Diabetes  mellitus without complication ([place])   . Headache   . Heartburn   . HLD (hyperlipidemia)   . Hypertension   . Melanoma ([place]) 2018   Right chest area resected  . Pneumonia    x2 Aug. 2017  . Psoriasis   . Renal insufficiency     reports that he has never smoked. He has never used smokeless tobacco. He reports current alcohol use. He reports previous drug  use.   Current Outpatient Medications:  .  acetaminophen (TYLENOL) 500 MG tablet, Take 1,000 mg by mouth every 6 (six) hours as needed (headache)., Disp: , Rfl:  .  aspirin EC 81 MG tablet, Take 324 mg by mouth daily as needed (headache)., Disp: , Rfl:  .  benazepril-hydrochlorthiazide (LOTENSIN HCT) 20-25 MG tablet, TAKE 1 TABLET DAILY, Disp: 90 tablet, Rfl: 3 .  famotidine (PEPCID AC) 10 MG chewable tablet, Chew 10 mg by mouth daily as needed for heartburn. , Disp: , Rfl:  .  HYDROcodone-acetaminophen (NORCO/VICODIN) 5-325 MG tablet, Take 1 tablet by mouth daily as needed for moderate pain., Disp: 30 tablet, Rfl: 0 .  metFORMIN (GLUCOPHAGE-XR) 500 MG 24 hr tablet, Take 1 tablet (500 mg total) by mouth daily with breakfast., Disp: 90 tablet, Rfl: 3 .  sildenafil (VIAGRA) 100 MG tablet, TAKE 1 TABLET AS NEEDED FOR ERECTILE DYSFUNCTION, Disp: 36 tablet, Rfl: 0 .  simvastatin (ZOCOR) 40 MG tablet, Take 1 tablet (40 mg total) by mouth daily., Disp: 90 tablet, Rfl: 3 .  SUMAtriptan (IMITREX) 100 MG tablet, TAKE 1 TABLET BY MOUTH EVERY 2 HOURS AS NEEDED, MAX OF 2 TABLETS IN 24 HOURS, Disp: 10 tablet, Rfl: 0 .  NONFORMULARY OR COMPOUNDED ITEM, Trimix (30/1/10)-(Pap/Phent/PGE)  Test Dose 51ml vial  Qty #1 Refills 0  [place] [phone] Fax [phone], Disp: 1 each, Rfl: 0   Observations/Objective: Blood pressure 128/66, pulse 97, temperature 98.1 F (36.7 C), temperature source Temporal, height 5' 6.5" (1.689 m), weight 213 lb 4 oz (96.7 kg), SpO2 97 %.  Physical Exam Constitutional:      General: He is not in acute distress.    Appearance: Normal appearance. He is well-developed. He is not ill-appearing or toxic-appearing.  HENT:     Head: Normocephalic and atraumatic.     Right Ear: Hearing, ear canal and external ear normal. A middle ear effusion is present.     Left Ear: Hearing, ear canal and external ear normal. A middle ear effusion is present.     Nose: Nose normal.      Mouth/Throat:     Pharynx: Uvula midline.  Eyes:     General: Lids are normal. Lids are everted, no foreign bodies appreciated.     Conjunctiva/sclera: Conjunctivae normal.     Pupils: Pupils are equal, round, and reactive to light.  Neck:     Musculoskeletal: Normal range of motion and neck supple.     Thyroid: No thyroid mass or thyromegaly.     Vascular: No carotid bruit.     Trachea: Trachea and phonation normal.  Cardiovascular:     Rate and Rhythm: Normal rate and regular rhythm.     Pulses: Normal pulses.     Heart sounds: S1 normal and S2 normal. No murmur. No gallop.   Pulmonary:     Breath sounds: Normal breath sounds. No wheezing, rhonchi or rales.  Abdominal:     General: Bowel sounds are normal.     Palpations: Abdomen is soft.     Tenderness:  There is no abdominal tenderness. There is no guarding or rebound.     Hernia: No hernia is present.  Musculoskeletal:     Left hip: He exhibits decreased range of motion and tenderness.     Comments: ttp lateral hip.Marland Kitchen of bursa  Lymphadenopathy:     Cervical: No cervical adenopathy.  Skin:    General: Skin is warm and dry.     Findings: No rash.  Neurological:     Mental Status: He is alert.     Cranial Nerves: No cranial nerve deficit.     Sensory: No sensory deficit.     Gait: Gait normal.     Deep Tendon Reflexes: Reflexes are normal and symmetric.  Psychiatric:        Speech: Speech normal.        Behavior: Behavior normal.        Judgment: Judgment normal.      Diabetic foot exam: Normal inspection No skin breakdown No calluses  Normal DP pulses Normal sensation to light touch and monofilament Nails normal  Assessment and Plan   The patient's preventative maintenance and recommended screening tests for an annual wellness exam were reviewed in full today. Brought up to date unless services declined.  Counselled on the importance of diet, exercise, and its role in overall health and mortality. The  patient's FH and SH was reviewed, including their home life, tobacco status, and drug and alcohol status.   Colon: great aunt and uncle with colon cancer, no first degree relative. Plans to do iFOB yearly. Vaccines Uptodate Tdap, feel had on in last 10 years and given flu. XFG:[id number] cancer, S/P robotic prostatectomy, followed by URO HIV: Refused. Nonsmoker Hep C: Done.      Eliezer Lofts, MD

## 2018-10-09 NOTE — Assessment & Plan Note (Signed)
Followed by URO.. will need radiation given grade.

## 2018-10-09 NOTE — Assessment & Plan Note (Signed)
Well controlled. Continue current medication. Encouraged exercise, weight loss, healthy eating habits.  

## 2018-10-09 NOTE — Assessment & Plan Note (Signed)
NSAIDs contraindicated.  At least in part due to diabetes.  Stable at baseline.

## 2018-10-09 NOTE — Assessment & Plan Note (Signed)
Well controlled. Continue current medication.  

## 2018-10-09 NOTE — Assessment & Plan Note (Signed)
No red flags 

## 2018-10-09 NOTE — Addendum Note (Signed)
Addended by: Verlene Mayer A on: 10/09/2018 02:05 PM   Modules accepted: Orders

## 2018-10-09 NOTE — Assessment & Plan Note (Signed)
Most consistent  With troch busitis. Treat with pred taper and home PT. If not improving refer to [facility] or ORTHO.

## 2018-10-09 NOTE — Assessment & Plan Note (Signed)
Steroid should help with this issue asn well as antihistamine.

## 2018-10-11 MED ORDER — PREDNISONE 20 MG PO TABS: ORAL_TABLET | ORAL | 0 refills | Status: DC

## 2018-10-17 ENCOUNTER — Other Ambulatory Visit: Payer: Self-pay

## 2018-10-18 ENCOUNTER — Ambulatory Visit
Admission: RE | Admit: 2018-10-18 | Discharge: 2018-10-18 | Disposition: A | Discharge status: Home / Self Care | Payer: 59 | Source: Ambulatory Visit | Attending: Radiation Oncology | Admitting: Radiation Oncology

## 2018-10-18 ENCOUNTER — Other Ambulatory Visit: Payer: Self-pay

## 2018-10-18 ENCOUNTER — Encounter: Payer: Self-pay | Admitting: Radiation Oncology

## 2018-10-18 VITALS — BP 140/81 | HR 63 | Temp 97.5°F | Resp 18 | Wt 214.2 lb

## 2018-10-18 DIAGNOSIS — C61 Malignant neoplasm of prostate: Secondary | ICD-10-CM | POA: Diagnosis not present

## 2018-10-18 DIAGNOSIS — N529 Male erectile dysfunction, unspecified: Secondary | ICD-10-CM | POA: Insufficient documentation

## 2018-10-18 NOTE — Progress Notes (Signed)
Radiation Oncology Follow up Note old patient new area salvage radiation therapy status post prostatectomy  Name: Dennis Hicks   Date:   10/18/2018 MRN:  [id number] DOB: November 28, 1955    This 63 y.o. male presents to the clinic today for evaluation for salvage radiation therapy status post robotic assisted prostatectomy for.  Stage IIIa (T3a N0 M0 Gleason 9 (4+5) adenocarcinoma the prostate status post robotic assisted prostatectomy with extraprostatic extension and invasion of the bladder neck  REFERRING PROVIDER: Jinny Sanders, MD  HPI: Patient is a 63 year old male originally consulted back in.  October 2019 when he presented with stage at least 2B Gleason 8 (4+4) adenocarcinoma the prostate presenting with a PSA of 6.8.  He went on to have a robotic assisted prostatectomy showed Gleason 9 (4+5) with invasion of the bladder neck extraprostatic extension.  There were multiple lymph nodes 21 are all all negative for malignancy.  No lymph vascular invasion perineural invasion was present.  Patient has had erectile dysfunction unresponsive to Viagra therapy since his surgery.  Patient also has urinary incontinence is wearing a sleeve like device with good result.  His PSAs postoperatively have remained undetectable.  He is seen today for consideration of salvage radiation therapy.  COMPLICATIONS OF TREATMENT: none  FOLLOW UP COMPLIANCE: keeps appointments   PHYSICAL EXAM:  BP 140/81   Pulse 63   Temp (!) 97.5 F (36.4 C)   Resp 18   Wt 214 lb 3.2 oz (97.2 kg)   BMI 34.05 kg/m  Well-developed well-nourished patient in NAD. HEENT reveals PERLA, EOMI, discs not visualized.  Oral cavity is clear. No oral mucosal lesions are identified. Neck is clear without evidence of cervical or supraclavicular adenopathy. Lungs are clear to A&P. Cardiac examination is essentially unremarkable with regular rate and rhythm without murmur rub or thrill. Abdomen is benign with no organomegaly or masses noted.  Motor sensory and DTR levels are equal and symmetric in the upper and lower extremities. Cranial nerves II through XII are grossly intact. Proprioception is intact. No peripheral adenopathy or edema is identified. No motor or sensory levels are noted. Crude visual fields are within normal range.  RADIOLOGY RESULTS: CT scans of the pelvis and MRI scan of the prostate are again reviewed  PLAN: Present time I have recommended salvage radiation therapy.  I would plan on delivering 7600 cGy to his prostatic fossa and local regional nodes over 7 weeks of treatment.  I would use IMRT treatment planning and delivery to spare critical structures such as his bladder and rectum.  I do not believe based on the 21- lymph nodes he needs pelvic lymph node radiation.  I would also like to have him suppressed with Lupron for 6 months at the time of his radiation therapy.  Patient has some trips planned over the next several weeks I have tentatively set up simulation in early September.  Risks and benefits of treatment including possible worsening of his erectile dysfunction and urinary incontinence fatigue alteration of blood counts skin reaction all were discussed in detail.  Patient comprehends my treatment plan well.  I have personally set up and scheduled CT simulation.  Patient knows to call in the meantime with any concerns.  I would like to take this opportunity to thank you for allowing me to participate in the care of your patient.Noreene Filbert, MD

## 2018-10-26 NOTE — Progress Notes (Signed)
Dennis Hicks denies any history of lymphoma/leukemia, sickle cell disease, prior priapism or taking the medication trazodone.  He states he is not longer having spontaneous erections and the PDE5i's were ineffective.   His BP today is 113/68.    Patient's left corpus cavernosum is identified.  An area near the base of the penis is cleansed with rubbing alcohol.  Careful to avoid the dorsal vein, 2 mcg of Trimix (papaverine 30 mg, phentolamine 1 mg and prostaglandin E1 10 mcg, Lot # [id number]  exp # 12/09/2018) is injected at a 90 degree angle into the left corpus cavernosum near the base of the penis.  Patient experienced a firm erection in 15 minutes.    Advised patient of the condition of priapism, painful erection lasting for more than four hours, and to contact the office immediately or seek treatment in the ED

## 2018-10-29 ENCOUNTER — Encounter: Payer: Self-pay | Admitting: Urology

## 2018-10-29 ENCOUNTER — Other Ambulatory Visit: Payer: Self-pay

## 2018-10-29 ENCOUNTER — Ambulatory Visit (INDEPENDENT_AMBULATORY_CARE_PROVIDER_SITE_OTHER): Payer: 59 | Admitting: Urology

## 2018-10-29 VITALS — BP 113/68 | HR 86 | Ht 66.0 in | Wt 213.0 lb

## 2018-10-29 DIAGNOSIS — N529 Male erectile dysfunction, unspecified: Secondary | ICD-10-CM

## 2018-10-29 DIAGNOSIS — C61 Malignant neoplasm of prostate: Secondary | ICD-10-CM

## 2018-11-16 ENCOUNTER — Other Ambulatory Visit: Payer: Self-pay | Admitting: Family Medicine

## 2018-11-23 ENCOUNTER — Other Ambulatory Visit: Payer: Self-pay

## 2018-11-27 ENCOUNTER — Ambulatory Visit
Admission: RE | Admit: 2018-11-27 | Discharge: 2018-11-27 | Disposition: A | Discharge status: Home / Self Care | Payer: 59 | Source: Ambulatory Visit | Attending: Radiation Oncology | Admitting: Radiation Oncology

## 2018-11-27 ENCOUNTER — Other Ambulatory Visit: Payer: Self-pay

## 2018-11-27 DIAGNOSIS — Z510 Encounter for antineoplastic radiation therapy: Secondary | ICD-10-CM | POA: Diagnosis not present

## 2018-11-27 DIAGNOSIS — C61 Malignant neoplasm of prostate: Secondary | ICD-10-CM | POA: Diagnosis not present

## 2018-11-29 DIAGNOSIS — Z510 Encounter for antineoplastic radiation therapy: Secondary | ICD-10-CM | POA: Diagnosis not present

## 2018-11-30 ENCOUNTER — Other Ambulatory Visit: Payer: Self-pay | Admitting: *Deleted

## 2018-11-30 DIAGNOSIS — C61 Malignant neoplasm of prostate: Secondary | ICD-10-CM

## 2018-12-05 LAB — HM DIABETES EYE EXAM

## 2018-12-06 ENCOUNTER — Other Ambulatory Visit: Payer: Self-pay | Admitting: Urology

## 2018-12-06 ENCOUNTER — Ambulatory Visit
Admission: RE | Admit: 2018-12-06 | Discharge: 2018-12-06 | Disposition: A | Discharge status: Home / Self Care | Payer: 59 | Source: Ambulatory Visit | Attending: Radiation Oncology | Admitting: Radiation Oncology

## 2018-12-06 ENCOUNTER — Encounter: Payer: Self-pay | Admitting: Family Medicine

## 2018-12-06 ENCOUNTER — Other Ambulatory Visit: Payer: Self-pay

## 2018-12-06 MED ORDER — CAVERJECT IMPULSE 10 MCG IC KIT: 10.0000 ug | PACK | INTRACAVERNOUS | 12 refills | Status: DC | PRN

## 2018-12-10 ENCOUNTER — Ambulatory Visit
Admission: RE | Admit: 2018-12-10 | Discharge: 2018-12-10 | Disposition: A | Discharge status: Home / Self Care | Payer: 59 | Source: Ambulatory Visit | Attending: Radiation Oncology | Admitting: Radiation Oncology

## 2018-12-10 ENCOUNTER — Other Ambulatory Visit: Payer: Self-pay

## 2018-12-10 DIAGNOSIS — Z510 Encounter for antineoplastic radiation therapy: Secondary | ICD-10-CM | POA: Diagnosis not present

## 2018-12-11 ENCOUNTER — Other Ambulatory Visit: Payer: Self-pay

## 2018-12-11 ENCOUNTER — Ambulatory Visit
Admission: RE | Admit: 2018-12-11 | Discharge: 2018-12-11 | Disposition: A | Discharge status: Home / Self Care | Payer: 59 | Source: Ambulatory Visit | Attending: Radiation Oncology | Admitting: Radiation Oncology

## 2018-12-11 DIAGNOSIS — Z510 Encounter for antineoplastic radiation therapy: Secondary | ICD-10-CM | POA: Diagnosis not present

## 2018-12-12 ENCOUNTER — Ambulatory Visit
Admission: RE | Admit: 2018-12-12 | Discharge: 2018-12-12 | Disposition: A | Discharge status: Home / Self Care | Payer: 59 | Source: Ambulatory Visit | Attending: Radiation Oncology | Admitting: Radiation Oncology

## 2018-12-12 ENCOUNTER — Other Ambulatory Visit: Payer: Self-pay

## 2018-12-12 ENCOUNTER — Telehealth: Payer: Self-pay | Admitting: Family Medicine

## 2018-12-12 ENCOUNTER — Other Ambulatory Visit: Payer: Self-pay | Admitting: Urology

## 2018-12-12 DIAGNOSIS — Z510 Encounter for antineoplastic radiation therapy: Secondary | ICD-10-CM | POA: Diagnosis not present

## 2018-12-12 MED ORDER — EDEX 10 MCG IC KIT: 10.0000 ug | PACK | INTRACAVERNOUS | 12 refills | Status: DC | PRN

## 2018-12-12 NOTE — Telephone Encounter (Signed)
He states he will try Edex

## 2018-12-12 NOTE — Telephone Encounter (Signed)
I have sent in the script for [place].

## 2018-12-12 NOTE — Telephone Encounter (Signed)
I can send in a script for EDEX and see if his insurance will cover that injection.  Would he like for me to do that?

## 2018-12-12 NOTE — Telephone Encounter (Signed)
[employer] was denied. I spoke to patient and informed him that it was denied. He states he does not want to try the trimix because it has to be frozen and it was 80.00 for 3 vials. He is wanting to know if there is anything else we can do?

## 2018-12-13 ENCOUNTER — Other Ambulatory Visit: Payer: Self-pay | Admitting: Urology

## 2018-12-13 ENCOUNTER — Other Ambulatory Visit: Payer: Self-pay

## 2018-12-13 ENCOUNTER — Ambulatory Visit
Admission: RE | Admit: 2018-12-13 | Discharge: 2018-12-13 | Disposition: A | Discharge status: Home / Self Care | Payer: 59 | Source: Ambulatory Visit | Attending: Radiation Oncology | Admitting: Radiation Oncology

## 2018-12-13 ENCOUNTER — Telehealth: Payer: Self-pay | Admitting: Family Medicine

## 2018-12-13 ENCOUNTER — Encounter: Payer: Self-pay | Admitting: Family Medicine

## 2018-12-13 DIAGNOSIS — Z510 Encounter for antineoplastic radiation therapy: Secondary | ICD-10-CM | POA: Diagnosis not present

## 2018-12-13 MED ORDER — CAVERJECT IMPULSE 10 MCG IC KIT: 10.0000 ug | PACK | INTRACAVERNOUS | 12 refills | Status: DC | PRN

## 2018-12-13 NOTE — Telephone Encounter (Signed)
Caverject has been approved Approval ID: [id number] Dates: 11/13/2018-12/13/2019

## 2018-12-14 ENCOUNTER — Other Ambulatory Visit: Payer: Self-pay

## 2018-12-14 ENCOUNTER — Ambulatory Visit
Admission: RE | Admit: 2018-12-14 | Discharge: 2018-12-14 | Disposition: A | Discharge status: Home / Self Care | Payer: 59 | Source: Ambulatory Visit | Attending: Radiation Oncology | Admitting: Radiation Oncology

## 2018-12-14 DIAGNOSIS — Z510 Encounter for antineoplastic radiation therapy: Secondary | ICD-10-CM | POA: Diagnosis not present

## 2018-12-17 ENCOUNTER — Other Ambulatory Visit: Payer: Self-pay

## 2018-12-17 ENCOUNTER — Ambulatory Visit
Admission: RE | Admit: 2018-12-17 | Discharge: 2018-12-17 | Disposition: A | Discharge status: Home / Self Care | Payer: 59 | Source: Ambulatory Visit | Attending: Radiation Oncology | Admitting: Radiation Oncology

## 2018-12-17 DIAGNOSIS — Z510 Encounter for antineoplastic radiation therapy: Secondary | ICD-10-CM | POA: Diagnosis not present

## 2018-12-18 ENCOUNTER — Ambulatory Visit
Admission: RE | Admit: 2018-12-18 | Discharge: 2018-12-18 | Disposition: A | Discharge status: Home / Self Care | Payer: 59 | Source: Ambulatory Visit | Attending: Radiation Oncology | Admitting: Radiation Oncology

## 2018-12-18 ENCOUNTER — Other Ambulatory Visit: Payer: Self-pay

## 2018-12-18 DIAGNOSIS — Z510 Encounter for antineoplastic radiation therapy: Secondary | ICD-10-CM | POA: Diagnosis not present

## 2018-12-19 ENCOUNTER — Other Ambulatory Visit: Payer: Self-pay

## 2018-12-19 ENCOUNTER — Ambulatory Visit
Admission: RE | Admit: 2018-12-19 | Discharge: 2018-12-19 | Disposition: A | Discharge status: Home / Self Care | Payer: 59 | Source: Ambulatory Visit | Attending: Radiation Oncology | Admitting: Radiation Oncology

## 2018-12-19 DIAGNOSIS — Z510 Encounter for antineoplastic radiation therapy: Secondary | ICD-10-CM | POA: Diagnosis not present

## 2018-12-20 ENCOUNTER — Ambulatory Visit
Admission: RE | Admit: 2018-12-20 | Discharge: 2018-12-20 | Disposition: A | Discharge status: Home / Self Care | Payer: 59 | Source: Ambulatory Visit | Attending: Radiation Oncology | Admitting: Radiation Oncology

## 2018-12-20 DIAGNOSIS — Z510 Encounter for antineoplastic radiation therapy: Secondary | ICD-10-CM | POA: Diagnosis not present

## 2018-12-20 DIAGNOSIS — C61 Malignant neoplasm of prostate: Secondary | ICD-10-CM | POA: Insufficient documentation

## 2018-12-21 ENCOUNTER — Ambulatory Visit: Payer: 59

## 2018-12-24 ENCOUNTER — Ambulatory Visit
Admission: RE | Admit: 2018-12-24 | Discharge: 2018-12-24 | Disposition: A | Discharge status: Home / Self Care | Payer: 59 | Source: Ambulatory Visit | Attending: Radiation Oncology | Admitting: Radiation Oncology

## 2018-12-24 ENCOUNTER — Inpatient Hospital Stay: Payer: 59 | Attending: Radiation Oncology

## 2018-12-24 ENCOUNTER — Other Ambulatory Visit: Payer: Self-pay

## 2018-12-24 DIAGNOSIS — Z510 Encounter for antineoplastic radiation therapy: Secondary | ICD-10-CM | POA: Diagnosis not present

## 2018-12-24 DIAGNOSIS — C61 Malignant neoplasm of prostate: Secondary | ICD-10-CM | POA: Diagnosis not present

## 2018-12-24 LAB — CBC
HCT: 45.4 % (ref 39.0–52.0)
Hemoglobin: 15.5 g/dL (ref 13.0–17.0)
MCH: 29.4 pg (ref 26.0–34.0)
MCHC: 34.1 g/dL (ref 30.0–36.0)
MCV: 86.1 fL (ref 80.0–100.0)
Platelets: 283 10*3/uL (ref 150–400)
RBC: 5.27 MIL/uL (ref 4.22–5.81)
RDW: 12.5 % (ref 11.5–15.5)
WBC: 8.5 10*3/uL (ref 4.0–10.5)
nRBC: 0 % (ref 0.0–0.2)

## 2018-12-25 ENCOUNTER — Other Ambulatory Visit: Payer: Self-pay

## 2018-12-25 ENCOUNTER — Ambulatory Visit
Admission: RE | Admit: 2018-12-25 | Discharge: 2018-12-25 | Disposition: A | Discharge status: Home / Self Care | Payer: 59 | Source: Ambulatory Visit | Attending: Radiation Oncology | Admitting: Radiation Oncology

## 2018-12-25 DIAGNOSIS — Z510 Encounter for antineoplastic radiation therapy: Secondary | ICD-10-CM | POA: Diagnosis not present

## 2018-12-26 ENCOUNTER — Ambulatory Visit
Admission: RE | Admit: 2018-12-26 | Discharge: 2018-12-26 | Disposition: A | Discharge status: Home / Self Care | Payer: 59 | Source: Ambulatory Visit | Attending: Radiation Oncology | Admitting: Radiation Oncology

## 2018-12-26 ENCOUNTER — Other Ambulatory Visit: Payer: Self-pay

## 2018-12-26 DIAGNOSIS — Z510 Encounter for antineoplastic radiation therapy: Secondary | ICD-10-CM | POA: Diagnosis not present

## 2018-12-27 ENCOUNTER — Other Ambulatory Visit: Payer: Self-pay

## 2018-12-27 ENCOUNTER — Ambulatory Visit
Admission: RE | Admit: 2018-12-27 | Discharge: 2018-12-27 | Disposition: A | Discharge status: Home / Self Care | Payer: 59 | Source: Ambulatory Visit | Attending: Radiation Oncology | Admitting: Radiation Oncology

## 2018-12-27 ENCOUNTER — Other Ambulatory Visit: Payer: Self-pay | Admitting: Family Medicine

## 2018-12-27 DIAGNOSIS — Z510 Encounter for antineoplastic radiation therapy: Secondary | ICD-10-CM | POA: Diagnosis not present

## 2018-12-28 ENCOUNTER — Other Ambulatory Visit: Payer: Self-pay

## 2018-12-28 ENCOUNTER — Ambulatory Visit
Admission: RE | Admit: 2018-12-28 | Discharge: 2018-12-28 | Disposition: A | Discharge status: Home / Self Care | Payer: 59 | Source: Ambulatory Visit | Attending: Radiation Oncology | Admitting: Radiation Oncology

## 2018-12-28 DIAGNOSIS — Z510 Encounter for antineoplastic radiation therapy: Secondary | ICD-10-CM | POA: Diagnosis not present

## 2018-12-31 ENCOUNTER — Other Ambulatory Visit: Payer: Self-pay

## 2018-12-31 ENCOUNTER — Telehealth: Payer: Self-pay | Admitting: *Deleted

## 2018-12-31 ENCOUNTER — Ambulatory Visit
Admission: RE | Admit: 2018-12-31 | Discharge: 2018-12-31 | Disposition: A | Discharge status: Home / Self Care | Payer: 59 | Source: Ambulatory Visit | Attending: Radiation Oncology | Admitting: Radiation Oncology

## 2018-12-31 DIAGNOSIS — Z510 Encounter for antineoplastic radiation therapy: Secondary | ICD-10-CM | POA: Diagnosis not present

## 2018-12-31 NOTE — Telephone Encounter (Signed)
We will call the patient and schedule them a appt to come in and see [facility]

## 2019-01-01 ENCOUNTER — Other Ambulatory Visit: Payer: Self-pay

## 2019-01-01 ENCOUNTER — Ambulatory Visit
Admission: RE | Admit: 2019-01-01 | Discharge: 2019-01-01 | Disposition: A | Discharge status: Home / Self Care | Payer: 59 | Source: Ambulatory Visit | Attending: Radiation Oncology | Admitting: Radiation Oncology

## 2019-01-01 DIAGNOSIS — Z510 Encounter for antineoplastic radiation therapy: Secondary | ICD-10-CM | POA: Diagnosis not present

## 2019-01-02 ENCOUNTER — Ambulatory Visit
Admission: RE | Admit: 2019-01-02 | Discharge: 2019-01-02 | Disposition: A | Discharge status: Home / Self Care | Payer: 59 | Source: Ambulatory Visit | Attending: Radiation Oncology | Admitting: Radiation Oncology

## 2019-01-02 ENCOUNTER — Other Ambulatory Visit: Payer: Self-pay

## 2019-01-02 DIAGNOSIS — Z510 Encounter for antineoplastic radiation therapy: Secondary | ICD-10-CM | POA: Diagnosis not present

## 2019-01-02 NOTE — Telephone Encounter (Signed)
I LMOM for pt to call back to schedule an appt with Larene [facility].

## 2019-01-03 ENCOUNTER — Other Ambulatory Visit: Payer: Self-pay

## 2019-01-03 ENCOUNTER — Ambulatory Visit
Admission: RE | Admit: 2019-01-03 | Discharge: 2019-01-03 | Disposition: A | Discharge status: Home / Self Care | Payer: 59 | Source: Ambulatory Visit | Attending: Radiation Oncology | Admitting: Radiation Oncology

## 2019-01-03 DIAGNOSIS — Z510 Encounter for antineoplastic radiation therapy: Secondary | ICD-10-CM | POA: Diagnosis not present

## 2019-01-04 ENCOUNTER — Ambulatory Visit
Admission: RE | Admit: 2019-01-04 | Discharge: 2019-01-04 | Disposition: A | Discharge status: Home / Self Care | Payer: 59 | Source: Ambulatory Visit | Attending: Radiation Oncology | Admitting: Radiation Oncology

## 2019-01-04 ENCOUNTER — Other Ambulatory Visit: Payer: Self-pay

## 2019-01-04 DIAGNOSIS — Z510 Encounter for antineoplastic radiation therapy: Secondary | ICD-10-CM | POA: Diagnosis not present

## 2019-01-07 ENCOUNTER — Inpatient Hospital Stay: Payer: 59

## 2019-01-07 ENCOUNTER — Ambulatory Visit
Admission: RE | Admit: 2019-01-07 | Discharge: 2019-01-07 | Disposition: A | Discharge status: Home / Self Care | Payer: 59 | Source: Ambulatory Visit | Attending: Radiation Oncology | Admitting: Radiation Oncology

## 2019-01-07 ENCOUNTER — Other Ambulatory Visit: Payer: Self-pay

## 2019-01-07 DIAGNOSIS — C61 Malignant neoplasm of prostate: Secondary | ICD-10-CM

## 2019-01-07 DIAGNOSIS — Z510 Encounter for antineoplastic radiation therapy: Secondary | ICD-10-CM | POA: Diagnosis not present

## 2019-01-07 LAB — CBC
HCT: 44.8 % (ref 39.0–52.0)
Hemoglobin: 15 g/dL (ref 13.0–17.0)
MCH: 29.6 pg (ref 26.0–34.0)
MCHC: 33.5 g/dL (ref 30.0–36.0)
MCV: 88.5 fL (ref 80.0–100.0)
Platelets: 252 10*3/uL (ref 150–400)
RBC: 5.06 MIL/uL (ref 4.22–5.81)
RDW: 12.7 % (ref 11.5–15.5)
WBC: 8.4 10*3/uL (ref 4.0–10.5)
nRBC: 0 % (ref 0.0–0.2)

## 2019-01-08 ENCOUNTER — Ambulatory Visit
Admission: RE | Admit: 2019-01-08 | Discharge: 2019-01-08 | Disposition: A | Discharge status: Home / Self Care | Payer: 59 | Source: Ambulatory Visit | Attending: Radiation Oncology | Admitting: Radiation Oncology

## 2019-01-08 ENCOUNTER — Other Ambulatory Visit: Payer: Self-pay

## 2019-01-08 DIAGNOSIS — Z510 Encounter for antineoplastic radiation therapy: Secondary | ICD-10-CM | POA: Diagnosis not present

## 2019-01-09 ENCOUNTER — Ambulatory Visit
Admission: RE | Admit: 2019-01-09 | Discharge: 2019-01-09 | Disposition: A | Discharge status: Home / Self Care | Payer: 59 | Source: Ambulatory Visit | Attending: Radiation Oncology | Admitting: Radiation Oncology

## 2019-01-09 ENCOUNTER — Other Ambulatory Visit: Payer: Self-pay

## 2019-01-09 DIAGNOSIS — Z510 Encounter for antineoplastic radiation therapy: Secondary | ICD-10-CM | POA: Diagnosis not present

## 2019-01-10 ENCOUNTER — Ambulatory Visit
Admission: RE | Admit: 2019-01-10 | Discharge: 2019-01-10 | Disposition: A | Discharge status: Home / Self Care | Payer: 59 | Source: Ambulatory Visit | Attending: Radiation Oncology | Admitting: Radiation Oncology

## 2019-01-10 ENCOUNTER — Other Ambulatory Visit: Payer: Self-pay

## 2019-01-10 DIAGNOSIS — Z510 Encounter for antineoplastic radiation therapy: Secondary | ICD-10-CM | POA: Diagnosis not present

## 2019-01-11 ENCOUNTER — Ambulatory Visit
Admission: RE | Admit: 2019-01-11 | Discharge: 2019-01-11 | Disposition: A | Discharge status: Home / Self Care | Payer: 59 | Source: Ambulatory Visit | Attending: Radiation Oncology | Admitting: Radiation Oncology

## 2019-01-11 ENCOUNTER — Other Ambulatory Visit: Payer: Self-pay

## 2019-01-11 DIAGNOSIS — Z510 Encounter for antineoplastic radiation therapy: Secondary | ICD-10-CM | POA: Diagnosis not present

## 2019-01-12 ENCOUNTER — Ambulatory Visit: Payer: 59

## 2019-01-14 ENCOUNTER — Other Ambulatory Visit: Payer: Self-pay

## 2019-01-14 ENCOUNTER — Ambulatory Visit
Admission: RE | Admit: 2019-01-14 | Discharge: 2019-01-14 | Disposition: A | Discharge status: Home / Self Care | Payer: 59 | Source: Ambulatory Visit | Attending: Radiation Oncology | Admitting: Radiation Oncology

## 2019-01-14 DIAGNOSIS — Z510 Encounter for antineoplastic radiation therapy: Secondary | ICD-10-CM | POA: Diagnosis not present

## 2019-01-15 ENCOUNTER — Ambulatory Visit
Admission: RE | Admit: 2019-01-15 | Discharge: 2019-01-15 | Disposition: A | Discharge status: Home / Self Care | Payer: 59 | Source: Ambulatory Visit | Attending: Radiation Oncology | Admitting: Radiation Oncology

## 2019-01-15 ENCOUNTER — Other Ambulatory Visit: Payer: Self-pay

## 2019-01-15 DIAGNOSIS — Z510 Encounter for antineoplastic radiation therapy: Secondary | ICD-10-CM | POA: Diagnosis not present

## 2019-01-16 ENCOUNTER — Other Ambulatory Visit: Payer: Self-pay

## 2019-01-16 ENCOUNTER — Ambulatory Visit
Admission: RE | Admit: 2019-01-16 | Discharge: 2019-01-16 | Disposition: A | Discharge status: Home / Self Care | Payer: 59 | Source: Ambulatory Visit | Attending: Radiation Oncology | Admitting: Radiation Oncology

## 2019-01-16 DIAGNOSIS — Z510 Encounter for antineoplastic radiation therapy: Secondary | ICD-10-CM | POA: Diagnosis not present

## 2019-01-17 ENCOUNTER — Other Ambulatory Visit: Payer: Self-pay

## 2019-01-17 ENCOUNTER — Ambulatory Visit
Admission: RE | Admit: 2019-01-17 | Discharge: 2019-01-17 | Disposition: A | Discharge status: Home / Self Care | Payer: 59 | Source: Ambulatory Visit | Attending: Radiation Oncology | Admitting: Radiation Oncology

## 2019-01-17 DIAGNOSIS — Z510 Encounter for antineoplastic radiation therapy: Secondary | ICD-10-CM | POA: Diagnosis not present

## 2019-01-18 ENCOUNTER — Other Ambulatory Visit: Payer: Self-pay

## 2019-01-18 ENCOUNTER — Ambulatory Visit
Admission: RE | Admit: 2019-01-18 | Discharge: 2019-01-18 | Disposition: A | Discharge status: Home / Self Care | Payer: 59 | Source: Ambulatory Visit | Attending: Radiation Oncology | Admitting: Radiation Oncology

## 2019-01-18 DIAGNOSIS — Z510 Encounter for antineoplastic radiation therapy: Secondary | ICD-10-CM | POA: Diagnosis not present

## 2019-01-21 ENCOUNTER — Ambulatory Visit
Admission: RE | Admit: 2019-01-21 | Discharge: 2019-01-21 | Disposition: A | Discharge status: Home / Self Care | Payer: 59 | Source: Ambulatory Visit | Attending: Radiation Oncology | Admitting: Radiation Oncology

## 2019-01-21 ENCOUNTER — Other Ambulatory Visit: Payer: Self-pay

## 2019-01-21 ENCOUNTER — Inpatient Hospital Stay: Payer: 59 | Attending: Radiation Oncology

## 2019-01-21 DIAGNOSIS — C61 Malignant neoplasm of prostate: Secondary | ICD-10-CM | POA: Insufficient documentation

## 2019-01-21 DIAGNOSIS — Z510 Encounter for antineoplastic radiation therapy: Secondary | ICD-10-CM | POA: Diagnosis not present

## 2019-01-21 LAB — CBC
HCT: 46.7 % (ref 39.0–52.0)
Hemoglobin: 15.6 g/dL (ref 13.0–17.0)
MCH: 29.7 pg (ref 26.0–34.0)
MCHC: 33.4 g/dL (ref 30.0–36.0)
MCV: 88.8 fL (ref 80.0–100.0)
Platelets: 292 10*3/uL (ref 150–400)
RBC: 5.26 MIL/uL (ref 4.22–5.81)
RDW: 12.7 % (ref 11.5–15.5)
WBC: 8 10*3/uL (ref 4.0–10.5)
nRBC: 0 % (ref 0.0–0.2)

## 2019-01-22 ENCOUNTER — Ambulatory Visit
Admission: RE | Admit: 2019-01-22 | Discharge: 2019-01-22 | Disposition: A | Discharge status: Home / Self Care | Payer: 59 | Source: Ambulatory Visit | Attending: Radiation Oncology | Admitting: Radiation Oncology

## 2019-01-22 ENCOUNTER — Other Ambulatory Visit: Payer: Self-pay

## 2019-01-22 DIAGNOSIS — Z510 Encounter for antineoplastic radiation therapy: Secondary | ICD-10-CM | POA: Diagnosis not present

## 2019-01-23 ENCOUNTER — Ambulatory Visit
Admission: RE | Admit: 2019-01-23 | Discharge: 2019-01-23 | Disposition: A | Discharge status: Home / Self Care | Payer: 59 | Source: Ambulatory Visit | Attending: Radiation Oncology | Admitting: Radiation Oncology

## 2019-01-23 ENCOUNTER — Other Ambulatory Visit: Payer: Self-pay

## 2019-01-23 DIAGNOSIS — Z510 Encounter for antineoplastic radiation therapy: Secondary | ICD-10-CM | POA: Diagnosis not present

## 2019-01-24 ENCOUNTER — Ambulatory Visit
Admission: RE | Admit: 2019-01-24 | Discharge: 2019-01-24 | Disposition: A | Discharge status: Home / Self Care | Payer: 59 | Source: Ambulatory Visit | Attending: Radiation Oncology | Admitting: Radiation Oncology

## 2019-01-24 ENCOUNTER — Other Ambulatory Visit: Payer: Self-pay

## 2019-01-24 DIAGNOSIS — Z510 Encounter for antineoplastic radiation therapy: Secondary | ICD-10-CM | POA: Diagnosis not present

## 2019-01-25 ENCOUNTER — Other Ambulatory Visit: Payer: Self-pay

## 2019-01-25 ENCOUNTER — Ambulatory Visit
Admission: RE | Admit: 2019-01-25 | Discharge: 2019-01-25 | Disposition: A | Discharge status: Home / Self Care | Payer: 59 | Source: Ambulatory Visit | Attending: Radiation Oncology | Admitting: Radiation Oncology

## 2019-01-25 DIAGNOSIS — Z510 Encounter for antineoplastic radiation therapy: Secondary | ICD-10-CM | POA: Diagnosis not present

## 2019-01-28 ENCOUNTER — Other Ambulatory Visit: Payer: Self-pay

## 2019-01-28 ENCOUNTER — Ambulatory Visit
Admission: RE | Admit: 2019-01-28 | Discharge: 2019-01-28 | Disposition: A | Discharge status: Home / Self Care | Payer: 59 | Source: Ambulatory Visit | Attending: Radiation Oncology | Admitting: Radiation Oncology

## 2019-01-28 DIAGNOSIS — C61 Malignant neoplasm of prostate: Secondary | ICD-10-CM | POA: Diagnosis present

## 2019-01-28 DIAGNOSIS — Z510 Encounter for antineoplastic radiation therapy: Secondary | ICD-10-CM | POA: Diagnosis not present

## 2019-01-29 ENCOUNTER — Ambulatory Visit
Admission: RE | Admit: 2019-01-29 | Discharge: 2019-01-29 | Disposition: A | Discharge status: Home / Self Care | Payer: 59 | Source: Ambulatory Visit | Attending: Radiation Oncology | Admitting: Radiation Oncology

## 2019-01-29 ENCOUNTER — Other Ambulatory Visit: Payer: Self-pay

## 2019-01-29 DIAGNOSIS — Z510 Encounter for antineoplastic radiation therapy: Secondary | ICD-10-CM | POA: Diagnosis not present

## 2019-01-30 ENCOUNTER — Other Ambulatory Visit: Payer: Self-pay

## 2019-01-30 ENCOUNTER — Ambulatory Visit
Admission: RE | Admit: 2019-01-30 | Discharge: 2019-01-30 | Disposition: A | Discharge status: Home / Self Care | Payer: 59 | Source: Ambulatory Visit | Attending: Radiation Oncology | Admitting: Radiation Oncology

## 2019-01-30 ENCOUNTER — Ambulatory Visit: Payer: 59

## 2019-01-30 DIAGNOSIS — Z510 Encounter for antineoplastic radiation therapy: Secondary | ICD-10-CM | POA: Diagnosis not present

## 2019-01-31 ENCOUNTER — Ambulatory Visit
Admission: RE | Admit: 2019-01-31 | Discharge: 2019-01-31 | Disposition: A | Discharge status: Home / Self Care | Payer: 59 | Source: Ambulatory Visit | Attending: Radiation Oncology | Admitting: Radiation Oncology

## 2019-01-31 ENCOUNTER — Other Ambulatory Visit: Payer: Self-pay

## 2019-01-31 DIAGNOSIS — Z510 Encounter for antineoplastic radiation therapy: Secondary | ICD-10-CM | POA: Diagnosis not present

## 2019-02-26 DIAGNOSIS — M545 Low back pain, unspecified: Secondary | ICD-10-CM

## 2019-02-26 DIAGNOSIS — G8929 Other chronic pain: Secondary | ICD-10-CM

## 2019-02-26 NOTE — Telephone Encounter (Signed)
Last office visit 10/09/2018 for CPE.  Last refilled 10/09/2018 for #30 with no refills.  UDS/Contract 04/20/2018.  No future appointments with PCP.

## 2019-02-28 NOTE — Telephone Encounter (Signed)
Please call pt.. he will need a virtual OV for refill of  Narcotic and order for MRI. If prefers simply referral to [facility] instead I can simply do that witout and appt.

## 2019-03-04 ENCOUNTER — Encounter: Payer: Self-pay | Admitting: Radiation Oncology

## 2019-03-04 ENCOUNTER — Other Ambulatory Visit: Payer: Self-pay

## 2019-03-04 ENCOUNTER — Other Ambulatory Visit: Payer: Self-pay | Admitting: *Deleted

## 2019-03-04 ENCOUNTER — Ambulatory Visit
Admission: RE | Admit: 2019-03-04 | Discharge: 2019-03-04 | Disposition: A | Discharge status: Home / Self Care | Payer: 59 | Source: Ambulatory Visit | Attending: Radiation Oncology | Admitting: Radiation Oncology

## 2019-03-04 VITALS — BP 128/82 | HR 75 | Temp 97.4°F | Resp 18 | Wt 217.6 lb

## 2019-03-04 DIAGNOSIS — Z923 Personal history of irradiation: Secondary | ICD-10-CM | POA: Diagnosis not present

## 2019-03-04 DIAGNOSIS — K5900 Constipation, unspecified: Secondary | ICD-10-CM | POA: Diagnosis not present

## 2019-03-04 DIAGNOSIS — C61 Malignant neoplasm of prostate: Secondary | ICD-10-CM | POA: Diagnosis not present

## 2019-03-04 NOTE — Progress Notes (Signed)
Radiation Oncology Follow up Note  Name: Dennis Hicks   Date:   03/04/2019 MRN:  [id number] DOB: 09/24/1955    This 63 y.o. male presents to the clinic today for 1 month follow-up status post rad radiation therapy and salvage mode status post robotic assisted prostatectomy for stage IIIa (T3 aN0 M0) Gleason 9 (4+5) adenocarcinoma.  REFERRING PROVIDER: Jinny Sanders, MD  HPI: Patient is a 63 year old male now at 1 month having completed salvage radiation therapy status post robotic prostatectomy with extraprostatic extension to the time of surgery and invasion of the bladder neck seen today in routine follow-up he is doing well.  He specifically denies diarrhea or any increased lower urinary tract symptoms.  He states states his bowels tends to be slightly constipated.  He is having no bone pain..  COMPLICATIONS OF TREATMENT: none  FOLLOW UP COMPLIANCE: keeps appointments   PHYSICAL EXAM:  BP 128/82   Pulse 75   Temp (!) 97.4 F (36.3 C)   Resp 18   Wt 217 lb 9.6 oz (98.7 kg)   BMI 35.12 kg/m  Well-developed well-nourished patient in NAD. HEENT reveals PERLA, EOMI, discs not visualized.  Oral cavity is clear. No oral mucosal lesions are identified. Neck is clear without evidence of cervical or supraclavicular adenopathy. Lungs are clear to A&P. Cardiac examination is essentially unremarkable with regular rate and rhythm without murmur rub or thrill. Abdomen is benign with no organomegaly or masses noted. Motor sensory and DTR levels are equal and symmetric in the upper and lower extremities. Cranial nerves II through XII are grossly intact. Proprioception is intact. No peripheral adenopathy or edema is identified. No motor or sensory levels are noted. Crude visual fields are within normal range.  RADIOLOGY RESULTS: No current films to review  PLAN: Present time patient 1 month out very low side effect profile from salvage radiation therapy to his prostate.  And pleased with his  overall progress.  I have asked to see him back in 3 months for follow-up.  He already has a PSA ordered by urology.  Patient knows to call with any concerns.  I would like to take this opportunity to thank you for allowing me to participate in the care of your patient.Noreene Filbert, MD

## 2019-03-04 NOTE — Telephone Encounter (Signed)
Virtual appointment scheduled for 03/08/2019 at 8:20 am.

## 2019-03-06 ENCOUNTER — Ambulatory Visit: Payer: 59 | Admitting: Radiation Oncology

## 2019-03-08 ENCOUNTER — Encounter: Payer: Self-pay | Admitting: Family Medicine

## 2019-03-08 ENCOUNTER — Other Ambulatory Visit: Payer: Self-pay

## 2019-03-08 ENCOUNTER — Ambulatory Visit (INDEPENDENT_AMBULATORY_CARE_PROVIDER_SITE_OTHER): Payer: 59 | Admitting: Family Medicine

## 2019-03-08 ENCOUNTER — Ambulatory Visit (INDEPENDENT_AMBULATORY_CARE_PROVIDER_SITE_OTHER)
Admission: RE | Admit: 2019-03-08 | Discharge: 2019-03-08 | Disposition: A | Discharge status: Home / Self Care | Payer: 59 | Source: Ambulatory Visit | Attending: Family Medicine | Admitting: Family Medicine

## 2019-03-08 VITALS — BP 100/60 | HR 112 | Temp 96.8°F | Ht 66.5 in | Wt 215.5 lb

## 2019-03-08 DIAGNOSIS — G8929 Other chronic pain: Secondary | ICD-10-CM | POA: Diagnosis not present

## 2019-03-08 DIAGNOSIS — M545 Low back pain, unspecified: Secondary | ICD-10-CM

## 2019-03-08 DIAGNOSIS — M25511 Pain in right shoulder: Secondary | ICD-10-CM | POA: Diagnosis not present

## 2019-03-08 MED ORDER — HYDROCODONE-ACETAMINOPHEN 5-325 MG PO TABS: 1.0000 | ORAL_TABLET | Freq: Every day | ORAL | 0 refills | Status: DC | PRN

## 2019-03-08 MED ORDER — PREDNISONE 20 MG PO TABS: ORAL_TABLET | ORAL | 0 refills | Status: DC

## 2019-03-08 NOTE — Progress Notes (Signed)
Chief Complaint  Patient presents with  . Shoulder Pain    Right  . Medication Refill    Norco    History of Present Illness: HPI  63 year old male presents for acute worsening of  chronic shoulder pain, pain management.  He reports that in last his shoulder pain is  Much worse than usual. 2 months ago he was throwing/lifting bags of grass..  Felt a pop at that time. since then pain in anterior subacromial pain as well as posterior shoulder pain in sharp  Stabs.. feels like catches at time.  Initially pain was so bad it radiated up neck and cause headache.. never down arm. Now no neck pain. At that time was using hydrocodone daily x 5 days. He feels like something loose in shoulder.  Decreased range of motion int ext rotation.  Normal strength and no numbness. Has not used the Tens unit.  NSAIDs avoided  Given CKD.   Hx of rotator cuff tear 1978... no repair. Wore a sling. MRI shoulder pain 2005. Torn rotator cuff.   He is using  lidocaine patch.  Waking him up at night.  Indication for chronic opioid: chronic right shoulder pain and chronic low back pain Medication and dose: hydrocodone 5/325 # pills per month: 3-10 times per month... using  About 30 in 5 months, but has increased  In last 2 months Last UDS date: 04/20/2018 Opioid Treatment Agreement signed (Y/N): Y Opioid Treatment Agreement last reviewed with patient:  Y NCCSRS reviewed this encounter (include red flags):   no red flags 03/08/19     This visit occurred during the SARS-CoV-2 public health emergency.  Safety protocols were in place, including screening questions prior to the visit, additional usage of staff PPE, and extensive cleaning of exam room while observing appropriate contact time as indicated for disinfecting solutions.   COVID 19 screen:  No recent travel or known exposure to COVID19 The patient denies respiratory symptoms of COVID 19 at this time. The importance of social distancing was  discussed today.     Review of Systems  Constitutional: Negative for chills and fever.  HENT: Negative for congestion and ear pain.   Eyes: Negative for pain and redness.  Respiratory: Negative for cough and shortness of breath.   Cardiovascular: Negative for chest pain, palpitations and leg swelling.  Gastrointestinal: Negative for abdominal pain, blood in stool, constipation, diarrhea, nausea and vomiting.  Genitourinary: Negative for dysuria.  Musculoskeletal: Negative for falls and myalgias.  Skin: Negative for rash.  Neurological: Negative for dizziness.  Psychiatric/Behavioral: Negative for depression. The patient is not nervous/anxious.       Past Medical History:  Diagnosis Date  . Borderline diabetes   . Bronchitis   . Diabetes mellitus without complication ([place])   . Headache   . Heartburn   . HLD (hyperlipidemia)   . Hypertension   . Melanoma ([place]) 2018   Right chest area resected  . Pneumonia    x2 Aug. 2017  . Psoriasis   . Renal insufficiency     reports that he has never smoked. He has never used smokeless tobacco. He reports current alcohol use. He reports previous drug use.   Current Outpatient Medications:  .  acetaminophen (TYLENOL) 500 MG tablet, Take 1,000 mg by mouth every 6 (six) hours as needed (headache)., Disp: , Rfl:  .  alprostadil (CAVERJECT IMPULSE) 10 MCG injection, 10 mcg by Intracavitary route as needed for erectile dysfunction. use no more than 3 times  per week, Disp: 1 each, Rfl: 12 .  aspirin EC 81 MG tablet, Take 324 mg by mouth daily as needed (headache)., Disp: , Rfl:  .  benazepril-hydrochlorthiazide (LOTENSIN HCT) 20-25 MG tablet, TAKE 1 TABLET DAILY, Disp: 90 tablet, Rfl: 3 .  famotidine (PEPCID AC) 10 MG chewable tablet, Chew 10 mg by mouth daily as needed for heartburn. , Disp: , Rfl:  .  HYDROcodone-acetaminophen (NORCO/VICODIN) 5-325 MG tablet, Take 1 tablet by mouth daily as needed for moderate pain., Disp: 30 tablet, Rfl: 0 .   metFORMIN (GLUCOPHAGE-XR) 500 MG 24 hr tablet, TAKE 1 TABLET DAILY WITH BREAKFAST, Disp: 90 tablet, Rfl: 3 .  NONFORMULARY OR COMPOUNDED ITEM, Trimix (30/1/10)-(Pap/Phent/PGE)  Test Dose 39ml vial  Qty #1 [place] [phone] Fax [phone], Disp: 1 each, Rfl: 0 .  predniSONE (DELTASONE) 20 MG tablet, 3 tabs by mouth daily x 3 days, then 2 tabs by mouth daily x 2 days then 1 tab by mouth daily x 2 days, Disp: 15 tablet, Rfl: 0 .  sildenafil (VIAGRA) 100 MG tablet, TAKE 1 TABLET AS NEEDED FOR ERECTILE DYSFUNCTION, Disp: 36 tablet, Rfl: 0 .  simvastatin (ZOCOR) 40 MG tablet, TAKE 1 TABLET DAILY, Disp: 90 tablet, Rfl: 3 .  SUMAtriptan (IMITREX) 100 MG tablet, TAKE 1 TABLET BY MOUTH EVERY 2 HOURS AS NEEDED, MAX OF 2 TABLETS IN 24 HOURS, Disp: 10 tablet, Rfl: 0   Observations/Objective: Blood pressure 100/60, pulse (!) 112, temperature (!) 96.8 F (36 C), temperature source Temporal, height 5' 6.5" (1.689 m), weight 215 lb 8 oz (97.8 kg), SpO2 97 %.  Physical Exam Constitutional:      Appearance: He is well-developed.  HENT:     Head: Normocephalic.     Right Ear: Hearing normal.     Left Ear: Hearing normal.     Nose: Nose normal.  Neck:     Thyroid: No thyroid mass or thyromegaly.     Vascular: No carotid bruit.     Trachea: Trachea normal.     Comments: Neg spurling's Cardiovascular:     Rate and Rhythm: Normal rate and regular rhythm.     Pulses: Normal pulses.     Heart sounds: Heart sounds not distant. No murmur. No friction rub. No gallop.      Comments: No peripheral edema Pulmonary:     Effort: Pulmonary effort is normal. No respiratory distress.     Breath sounds: Normal breath sounds.  Musculoskeletal:     Right shoulder: Tenderness present. No swelling, deformity or bony tenderness. Decreased range of motion. Normal strength.     Left shoulder: Normal. No swelling, tenderness or bony tenderness. Normal strength.     Cervical back: Full passive range  of motion without pain and normal range of motion. No torticollis. No pain with movement, spinous process tenderness or muscular tenderness. Normal range of motion.     Comments:  Positive neer's, negative drop arm test.  Skin:    General: Skin is warm and dry.     Findings: No rash.  Psychiatric:        Speech: Speech normal.        Behavior: Behavior normal.        Thought Content: Thought content normal.      Assessment and Plan    Acute on chronic shoulder pain: Likely re-injury or rotator cuff.  Eval with X-ray but may need MRI eval.  NSAIDS contraindicated.. use pred taper and hydrocodone for pain.  Start home PT.Marland Kitchen exercises given. Start heat.  Refer to [facility] for further evaluation.  Chronic pain management: no red flags. PDMP reviewed. UDS and controlled sub contact u[ptodate. Given 45 tablets  Which when pain back to baseline should last 6 months. Pt to follow up chronic pain in 6 months.   Eliezer Lofts, MD

## 2019-03-08 NOTE — Patient Instructions (Signed)
Complete prednisone taper for pain and inflammation.  Can use hydrocodone for breakthrough pain.  Start home physical therapy.  We will call with X-ray results. We will call to set up referral to Ortho for further assessment.

## 2019-04-08 ENCOUNTER — Encounter: Payer: Self-pay | Admitting: Urology

## 2019-04-10 ENCOUNTER — Other Ambulatory Visit: Payer: Self-pay

## 2019-04-10 ENCOUNTER — Other Ambulatory Visit
Admission: RE | Admit: 2019-04-10 | Discharge: 2019-04-10 | Disposition: A | Discharge status: Home / Self Care | Payer: 59 | Source: Ambulatory Visit | Attending: Radiation Oncology | Admitting: Radiation Oncology

## 2019-04-10 ENCOUNTER — Encounter: Payer: Self-pay | Admitting: Urology

## 2019-04-10 DIAGNOSIS — C61 Malignant neoplasm of prostate: Secondary | ICD-10-CM | POA: Diagnosis not present

## 2019-04-11 LAB — PSA: Prostatic Specific Antigen: <0.01 ng/mL (ref 0.00–4.00)

## 2019-04-12 ENCOUNTER — Encounter: Payer: Self-pay | Admitting: Urology

## 2019-04-12 ENCOUNTER — Ambulatory Visit: Payer: 59 | Admitting: Urology

## 2019-04-12 ENCOUNTER — Other Ambulatory Visit: Payer: Self-pay

## 2019-04-12 VITALS — BP 153/74 | HR 102 | Ht 66.0 in | Wt 215.0 lb

## 2019-04-12 DIAGNOSIS — N393 Stress incontinence (female) (male): Secondary | ICD-10-CM

## 2019-04-12 DIAGNOSIS — N5231 Erectile dysfunction following radical prostatectomy: Secondary | ICD-10-CM | POA: Diagnosis not present

## 2019-04-12 DIAGNOSIS — C61 Malignant neoplasm of prostate: Secondary | ICD-10-CM

## 2019-04-12 NOTE — Progress Notes (Signed)
04/12/2019 3:29 PM   [place] [birth year] [id number]  Referring provider: Jinny Sanders, MD [id number] [place],  [place] [address]  Chief Complaint  Patient presents with  . Prostate Cancer    HPI: 64 year old male with a personal history of high risk prostate cancer bladder neck invasion status post prostatectomy and now most recently adjuvant XRT completed 01/2019.  Surgical pathology consistent with high risk Gleason 4+5 prostate cancer with invasion of the bladder neck, extraprostatic extension,negative margins, negative lymph nodes, no seminal vesicle involvement.pT3a N0 MX.    His PSA remains undetectable as of April 10, 2019.  In terms of urinary leakage, he is doing extremely well.  He did not have any issues with radiation has not changed his urinary symptoms whatsoever.  He was wearing a protective a sleevelike device over his penis for stress incontinence however about a week ago stopped wearing this.  He has been essentially dry.  He is very pleased.  He also has severe erectile dysfunction.  He has been using Trimix which was effective but is somewhat cost prohibitive as well as cumbersome to keep in the freezer.  He was given Caverject by Larene Beach but is anxious about using this on his own.  He would like another injection teaching with her.  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  Has been very busy during the stressful time.  He does have a new position within the same company will be doing a lot more travel.   PMH: Past Medical History:  Diagnosis Date  . Borderline diabetes   . Bronchitis   . Diabetes mellitus without complication ([place])   . Headache   . Heartburn   . HLD (hyperlipidemia)   . Hypertension   . Melanoma ([place]) 2018   Right chest area resected  . Pneumonia    x2 Aug. 2017  . Psoriasis   . Renal insufficiency     Surgical History: Past Surgical History:  Procedure  Laterality Date  . FINGER GANGLION CYST EXCISION    . PELVIC LYMPH NODE DISSECTION Bilateral 02/19/2018   Procedure: PELVIC LYMPH NODE DISSECTION;  Surgeon: Hollice Espy, MD;  Location: [facility] ORS;  Service: Urology;  Laterality: Bilateral;  . ROBOT ASSISTED LAPAROSCOPIC RADICAL PROSTATECTOMY N/A 02/19/2018   Procedure: ROBOTIC ASSISTED LAPAROSCOPIC RADICAL PROSTATECTOMY;  Surgeon: Hollice Espy, MD;  Location: [facility] ORS;  Service: Urology;  Laterality: N/A;    Home Medications:  Allergies as of 04/12/2019      Reactions   Chocolate Other (See Comments)   Trigger Migraine   Ibuprofen Other (See Comments)   Kidney issues   Shrimp [shellfish Allergy] Other (See Comments)   Trigger Migraines      Medication List       Accurate as of April 12, 2019  3:29 PM. If you have any questions, ask your nurse or doctor.        acetaminophen 500 MG tablet Commonly known as: TYLENOL Take 1,000 mg by mouth every 6 (six) hours as needed (headache).   aspirin EC 81 MG tablet Take 324 mg by mouth daily as needed (headache).   benazepril-hydrochlorthiazide 20-25 MG tablet Commonly known as: LOTENSIN HCT TAKE 1 TABLET DAILY   Caverject Impulse 10 MCG injection Generic drug: alprostadil 10 mcg by Intracavitary route as needed for erectile dysfunction. use no more than 3 times per week   famotidine 10 MG chewable tablet Commonly known as: PEPCID AC  Chew 10 mg by mouth daily as needed for heartburn.   HYDROcodone-acetaminophen 5-325 MG tablet Commonly known as: NORCO/VICODIN Take 1 tablet by mouth daily as needed for moderate pain.   metFORMIN 500 MG 24 hr tablet Commonly known as: GLUCOPHAGE-XR TAKE 1 TABLET DAILY WITH BREAKFAST   NONFORMULARY OR COMPOUNDED ITEM Trimix (30/1/10)-(Pap/Phent/PGE)  Test Dose 57ml vial  Qty #1 Refills 0  [place] [phone] Fax [phone]   predniSONE 20 MG tablet Commonly known as: DELTASONE 3 tabs by mouth daily x 3 days, then  2 tabs by mouth daily x 2 days then 1 tab by mouth daily x 2 days   sildenafil 100 MG tablet Commonly known as: Viagra TAKE 1 TABLET AS NEEDED FOR ERECTILE DYSFUNCTION   simvastatin 40 MG tablet Commonly known as: ZOCOR TAKE 1 TABLET DAILY   SUMAtriptan 100 MG tablet Commonly known as: IMITREX TAKE 1 TABLET BY MOUTH EVERY 2 HOURS AS NEEDED, MAX OF 2 TABLETS IN 24 HOURS       Allergies:  Allergies  Allergen Reactions  . Chocolate Other (See Comments)    Trigger Migraine  . Ibuprofen Other (See Comments)    Kidney issues  . Shrimp [Shellfish Allergy] Other (See Comments)    Trigger Migraines    Family History: Family History  Problem Relation Age of Onset  . Hypertension Mother   . Coronary artery disease Father   . Hypertension Father   . Hyperlipidemia Father   . Diabetes Father   . Prostate cancer Neg Hx   . Kidney cancer Neg Hx   . Bladder Cancer Neg Hx     Social History:  reports that he has never smoked. He has never used smokeless tobacco. He reports current alcohol use. He reports previous drug use.  ROS: UROLOGY Frequent Urination?: No Hard to postpone urination?: No Burning/pain with urination?: No Get up at night to urinate?: No Leakage of urine?: No Urine stream starts and stops?: No Trouble starting stream?: No Do you have to strain to urinate?: No Blood in urine?: No Urinary tract infection?: No Sexually transmitted disease?: No Injury to kidneys or bladder?: No Painful intercourse?: No Weak stream?: No Erection problems?: No Penile pain?: No  Gastrointestinal Nausea?: No Vomiting?: No Indigestion/heartburn?: No Diarrhea?: No Constipation?: No  Constitutional Fever: No Night sweats?: No Weight loss?: No Fatigue?: No  Skin Skin rash/lesions?: No Itching?: No  Eyes Blurred vision?: No Double vision?: No  Ears/Nose/Throat Sore throat?: No Sinus problems?: No  Hematologic/Lymphatic Swollen glands?: No Easy bruising?:  No  Cardiovascular Leg swelling?: No Chest pain?: No  Respiratory Cough?: No Shortness of breath?: No  Endocrine Excessive thirst?: No  Musculoskeletal Back pain?: No Joint pain?: No  Neurological Headaches?: No Dizziness?: No  Psychologic Depression?: No Anxiety?: No  Physical Exam: BP (!) 153/74   Pulse (!) 102   Ht 5\' 6"  (1.676 m)   Wt 215 lb (97.5 kg)   BMI 34.70 kg/m   Constitutional:  Alert and oriented, No acute distress. HEENT: [place] AT, moist mucus membranes.  Trachea midline, no masses. Cardiovascular: No clubbing, cyanosis, or edema. Respiratory: Normal respiratory effort, no increased work of breathing. Skin: No rashes, bruises or suspicious lesions. Neurologic: Grossly intact, no focal deficits, moving all 4 extremities. Psychiatric: Normal mood and affect.  Laboratory Data: PSA as above   Assessment & Plan:    1. Prostate cancer ([place]) High risk prostate cancer status post prostatectomy and adjuvant radiation given presence of bladder neck invasion  PSA remains undetectable  We will continue to check PSA on at least a every 6 month basis for the time being given his high risk disease He is agreeable this plan  2. Erectile dysfunction following radical prostatectomy Anxious about Caverject but this more conducive to lifestyle  Request another appointment with Larene Beach for teaching  3. Stress incontinence of urine Minimal, encouraged continued pelvic floor exercises   6 months with PSA prior (choses [place] [facility])   Hollice Espy, MD  [facility] [address] [place], [place] [address] [phone]

## 2019-04-22 ENCOUNTER — Ambulatory Visit: Payer: 59 | Attending: Internal Medicine

## 2019-04-22 DIAGNOSIS — Z20822 Contact with and (suspected) exposure to covid-19: Secondary | ICD-10-CM

## 2019-04-23 LAB — NOVEL CORONAVIRUS, NAA: SARS-CoV-2, NAA: NOT DETECTED

## 2019-04-30 ENCOUNTER — Other Ambulatory Visit: Payer: Self-pay | Admitting: Family Medicine

## 2019-05-06 ENCOUNTER — Other Ambulatory Visit: Payer: Self-pay

## 2019-05-06 ENCOUNTER — Encounter: Payer: Self-pay | Admitting: Urology

## 2019-05-06 ENCOUNTER — Ambulatory Visit: Payer: 59 | Admitting: Urology

## 2019-05-06 VITALS — BP 115/65 | HR 114 | Ht 66.0 in | Wt 210.0 lb

## 2019-05-06 DIAGNOSIS — N5231 Erectile dysfunction following radical prostatectomy: Secondary | ICD-10-CM | POA: Diagnosis not present

## 2019-05-06 DIAGNOSIS — N393 Stress incontinence (female) (male): Secondary | ICD-10-CM

## 2019-05-06 DIAGNOSIS — C61 Malignant neoplasm of prostate: Secondary | ICD-10-CM | POA: Diagnosis not present

## 2019-05-06 MED ORDER — ALPROSTADIL (VASODILATOR) 1000 MCG UR PLLT: 1000.0000 ug | PELLET | URETHRAL | 3 refills | Status: DC | PRN

## 2019-05-06 MED ORDER — SILDENAFIL CITRATE 100 MG PO TABS: 100.0000 mg | ORAL_TABLET | Freq: Every day | ORAL | 3 refills | Status: DC | PRN

## 2019-05-06 NOTE — Patient Instructions (Signed)

## 2019-05-06 NOTE — Progress Notes (Signed)
05/06/2019 9:52 AM   Dennis Hicks Pion [birth year] [id number]  Referring provider: Jinny Sanders, MD [address] [place],  [place] [address]  Chief Complaint  Patient presents with  . Erectile Dysfunction    HPI: Dennis Hicks is a 64 year old male with prostate cancer, stress incontinence and erectile dysfunction who presents today to discuss Caverject injections.  Prostate cancer Surgical pathology consistent with high risk Gleason 4+5 prostate cancer with invasion of the bladder neck, extraprostatic extension,negative margins, negative lymph nodes, no seminal vesicle involvement.pT3a N0 MX.  Adjuvant XRT completed 01/2019.  His PSA remains undetectable as of April 10, 2019.  Upcoming lab appointment on 05/29/2019 with the cancer center and appointment with Dr. Donella Stade on 06/05/2019.  Follow up appointment with Dr. Erlene Quan 10/11/2019.    SUI Mostly resolved.  Occurs only with strenuous coughing or laughing.  Erectile dysfunction Failed PDE 5 inhibitors.  Be concerned with lack of spontaneity with the use of the injections.  Curious about other treatment options.  Denies any spontaneous erections.  He has noted a curvature in his penis since the use of the incontinent clip.  He is able to achieve orgasm without erections.   PMH: Past Medical History:  Diagnosis Date  . Borderline diabetes   . Bronchitis   . Diabetes mellitus without complication ([facility])   . Headache   . Heartburn   . HLD (hyperlipidemia)   . Hypertension   . Melanoma ([facility]) 2018   Right chest area resected  . Pneumonia    x2 Aug. 2017  . Psoriasis   . Renal insufficiency     Surgical History: Past Surgical History:  Procedure Laterality Date  . FINGER GANGLION CYST EXCISION    . PELVIC LYMPH NODE DISSECTION Bilateral 02/19/2018   Procedure: PELVIC LYMPH NODE DISSECTION;  Surgeon: Hollice Espy, MD;  Location: [facility] ORS;  Service: Urology;  Laterality: Bilateral;  . ROBOT ASSISTED  LAPAROSCOPIC RADICAL PROSTATECTOMY N/A 02/19/2018   Procedure: ROBOTIC ASSISTED LAPAROSCOPIC RADICAL PROSTATECTOMY;  Surgeon: Hollice Espy, MD;  Location: [facility] ORS;  Service: Urology;  Laterality: N/A;    Home Medications:  Allergies as of 05/06/2019      Reactions   Chocolate Other (See Comments)   Trigger Migraine   Ibuprofen Other (See Comments)   Kidney issues   Shrimp [shellfish Allergy] Other (See Comments)   Trigger Migraines      Medication List       Accurate as of May 06, 2019  9:52 AM. If you have any questions, ask your nurse or doctor.        acetaminophen 500 MG tablet Commonly known as: TYLENOL Take 1,000 mg by mouth every 6 (six) hours as needed (headache).   aspirin EC 81 MG tablet Take 324 mg by mouth daily as needed (headache).   benazepril-hydrochlorthiazide 20-25 MG tablet Commonly known as: LOTENSIN HCT TAKE 1 TABLET DAILY   Caverject Impulse 10 MCG injection Generic drug: alprostadil 10 mcg by Intracavitary route as needed for erectile dysfunction. use no more than 3 times per week What changed: Another medication with the same name was added. Make sure you understand how and when to take each. Changed by: Zara Council, PA-C   alprostadil 1000 MCG pellet Commonly known as: Muse 1 each (1,000 mcg total) by Transurethral route as needed for erectile dysfunction. use no more than 3 times per week What changed: You were already taking a medication with the same name, and this prescription was added. Make  sure you understand how and when to take each. Changed by: Zara Council, PA-C   famotidine 10 MG chewable tablet Commonly known as: PEPCID AC Chew 10 mg by mouth daily as needed for heartburn.   HYDROcodone-acetaminophen 5-325 MG tablet Commonly known as: NORCO/VICODIN Take 1 tablet by mouth daily as needed for moderate pain.   metFORMIN 500 MG 24 hr tablet Commonly known as: GLUCOPHAGE-XR TAKE 1 TABLET DAILY WITH BREAKFAST     NONFORMULARY OR COMPOUNDED ITEM Trimix (30/1/10)-(Pap/Phent/PGE)  Test Dose 9ml vial  Qty #1 Refills 0  [place] [phone] Fax [phone]   predniSONE 20 MG tablet Commonly known as: DELTASONE 3 tabs by mouth daily x 3 days, then 2 tabs by mouth daily x 2 days then 1 tab by mouth daily x 2 days   sildenafil 100 MG tablet Commonly known as: VIAGRA Take 1 tablet (100 mg total) by mouth daily as needed for erectile dysfunction. What changed:   how much to take  how to take this  when to take this  reasons to take this  additional instructions Changed by: Zayde Stroupe, PA-C   simvastatin 40 MG tablet Commonly known as: ZOCOR TAKE 1 TABLET DAILY   SUMAtriptan 100 MG tablet Commonly known as: IMITREX TAKE 1 TABLET BY MOUTH EVERY 2 HOURS AS NEEDED, MAX OF 2 TABLETS IN 24 HOURS       Allergies:  Allergies  Allergen Reactions  . Chocolate Other (See Comments)    Trigger Migraine  . Ibuprofen Other (See Comments)    Kidney issues  . Shrimp [Shellfish Allergy] Other (See Comments)    Trigger Migraines    Family History: Family History  Problem Relation Age of Onset  . Hypertension Mother   . Coronary artery disease Father   . Hypertension Father   . Hyperlipidemia Father   . Diabetes Father   . Prostate cancer Neg Hx   . Kidney cancer Neg Hx   . Bladder Cancer Neg Hx     Social History:  reports that he has never smoked. He has never used smokeless tobacco. He reports current alcohol use. He reports previous drug use.  ROS: UROLOGY Frequent Urination?: No Hard to postpone urination?: No Burning/pain with urination?: No Get up at night to urinate?: No Leakage of urine?: No Urine stream starts and stops?: No Trouble starting stream?: No Do you have to strain to urinate?: No Blood in urine?: No Urinary tract infection?: No Sexually transmitted disease?: No Injury to kidneys or bladder?: No Painful intercourse?: No Weak  stream?: No Erection problems?: No Penile pain?: No  Gastrointestinal Nausea?: No Vomiting?: No Indigestion/heartburn?: No Diarrhea?: No Constipation?: No  Constitutional Fever: No Night sweats?: No Weight loss?: No Fatigue?: No  Skin Skin rash/lesions?: No Itching?: No  Eyes Blurred vision?: No Double vision?: No  Ears/Nose/Throat Sore throat?: No Sinus problems?: No  Hematologic/Lymphatic Swollen glands?: No Easy bruising?: No  Cardiovascular Leg swelling?: No Chest pain?: No  Respiratory Cough?: No Shortness of breath?: No  Endocrine Excessive thirst?: No  Musculoskeletal Back pain?: No Joint pain?: No  Neurological Headaches?: No Dizziness?: No  Psychologic Depression?: No Anxiety?: No  Physical Exam: BP 115/65   Pulse (!) 114   Ht 5\' 6"  (1.676 m)   Wt 210 lb (95.3 kg)   BMI 33.89 kg/m   Constitutional:  Well nourished. Alert and oriented, No acute distress. HEENT: [place] AT, mask in place.  Trachea midline, no masses. Cardiovascular: No clubbing, cyanosis, or edema. Respiratory:  Normal respiratory effort, no increased work of breathing. Neurologic: Grossly intact, no focal deficits, moving all 4 extremities. Psychiatric: Normal mood and affect.  Laboratory Data: Lab Results  Component Value Date   WBC 8.0 01/21/2019   HGB 15.6 01/21/2019   HCT 46.7 01/21/2019   MCV 88.8 01/21/2019   PLT 292 01/21/2019    Lab Results  Component Value Date   CREATININE 1.31 (H) 10/04/2018    Lab Results  Component Value Date   PSA 6.29 (H) 11/26/2015   PSA 5.34 (H) 03/10/2015   PSA 3.76 03/03/2014    Lab Results  Component Value Date   HGBA1C 6.7 (H) 10/04/2018       Component Value Date/Time   CHOL 164 10/04/2018 0815   HDL 32 (L) 10/04/2018 0815   CHOLHDL 5.1 (H) 10/04/2018 0815   CHOLHDL 4 09/07/2017 0810   VLDL 22.8 09/07/2017 0810   LDLCALC 106 (H) 10/04/2018 0815    Lab Results  Component Value Date   AST 20 10/04/2018    Lab Results  Component Value Date   ALT 29 10/04/2018    Urinalysis    Component Value Date/Time   COLORURINE STRAW (A) 02/08/2018 0931   APPEARANCEUR CLEAR (A) 02/08/2018 0931   APPEARANCEUR Clear 07/11/2016 1503   LABSPEC 1.005 02/08/2018 0931   PHURINE 8.0 02/08/2018 0931   GLUCOSEU 50 (A) 02/08/2018 0931   HGBUR NEGATIVE 02/08/2018 0931   BILIRUBINUR NEGATIVE 02/08/2018 0931   BILIRUBINUR Negative 07/11/2016 1503   KETONESUR NEGATIVE 02/08/2018 0931   PROTEINUR NEGATIVE 02/08/2018 0931   UROBILINOGEN 0.2 04/26/2016 1110   NITRITE NEGATIVE 02/08/2018 0931   LEUKOCYTESUR TRACE (A) 02/08/2018 0931   LEUKOCYTESUR Negative 07/11/2016 1503  I have reviewed the labs.   Assessment & Plan:    1. Erectile dysfunction Did not have success with PDE 5 inhibitors, but he states his been several months since he has tried those medications and would like to retry at this time. Sent a prescription for sildenafil 100 mg, 1 tablet 2 hours prior to intercourse on empty stomach, #30 with 3 refills to [facility] so he can use the good Rx price. He does not admit to using nitrates and I advised him against using nitrates while on Viagra and also not to take the Viagra while using the Caverject We discussed the MUSE (alprostradil pellets) and discussed how the medication is administered and that a common side effect is urethral burning and complaints of partner experiencing a burning sensation as well.  I also discussed that most patients find it cost prohibitive, so it is not used very often. We also discussed the vacuum erectile device and I explained that I do have some men use the device, but they do not find it very satisfactory for sexual intercourse is a finding cumbersome and disruptive to spontaneity, but he may want to use the device for a " penile rehabilitation" to encourage blood flow into the penis and to correct the penile curvature. In order to accomplish the goal of increased  spontaneity to his sexual intercourse, he may want to consider a placement of the penile prosthesis as it would allow him to produce an erection on demand.  I explained it would have to be placed surgically and we would refer him to either [facility] or [facility] for further consideration for this procedure.  He would like to check with his insurance at this time concerning the cost of this surgery. I demonstrated how to mix the  Caverject injection.  According to the package insert the medication is good for 24 hours and as long as is kept in the refrigerator after mixing.  He states he will inject 5 mcg of the Caverject later today to see its effectiveness. Advised patient of the condition of priapism, painful erection lasting for more than four hours, and to contact the office immediately or seek treatment in the ED  2. Prostate cancer Recent PSA undetectable Patient encouraged to keep follow-up appointments as listed above  3. SUI Mostly resolved at this time   Return for Keep follow up appointments .  These notes generated with voice recognition software. I apologize for typographical errors.  Zara Council, PA-C  [facility] [address]  [place], [place] [address] [phone]

## 2019-05-29 ENCOUNTER — Inpatient Hospital Stay: Payer: 59 | Attending: Radiation Oncology

## 2019-05-29 ENCOUNTER — Other Ambulatory Visit: Payer: Self-pay

## 2019-05-29 DIAGNOSIS — C61 Malignant neoplasm of prostate: Secondary | ICD-10-CM | POA: Diagnosis not present

## 2019-05-29 LAB — PSA: Prostatic Specific Antigen: <0.01 ng/mL (ref 0.00–4.00)

## 2019-06-04 ENCOUNTER — Other Ambulatory Visit: Payer: Self-pay

## 2019-06-04 ENCOUNTER — Encounter: Payer: Self-pay | Admitting: Radiation Oncology

## 2019-06-05 ENCOUNTER — Encounter: Payer: Self-pay | Admitting: Radiation Oncology

## 2019-06-05 ENCOUNTER — Ambulatory Visit
Admission: RE | Admit: 2019-06-05 | Discharge: 2019-06-05 | Disposition: A | Discharge status: Home / Self Care | Payer: 59 | Source: Ambulatory Visit | Attending: Radiation Oncology | Admitting: Radiation Oncology

## 2019-06-05 VITALS — BP 129/83 | HR 90 | Temp 97.8°F | Resp 20 | Wt 216.4 lb

## 2019-06-05 DIAGNOSIS — C61 Malignant neoplasm of prostate: Secondary | ICD-10-CM | POA: Diagnosis not present

## 2019-06-05 DIAGNOSIS — Z923 Personal history of irradiation: Secondary | ICD-10-CM | POA: Insufficient documentation

## 2019-06-05 NOTE — Progress Notes (Signed)
Radiation Oncology Follow up Note  Name: Dennis Hicks   Date:   06/05/2019 MRN:  [id number] DOB: [birth year]    This 64 y.o. male presents to the clinic today for 76-month follow-up status post radiation therapy to salvage mode status post robotic assisted prostatectomy for stage IIIa (T3a N0 M0) Gleason 9 (4+5) adenocarcinoma the prostate.  REFERRING PROVIDER: Jinny Sanders, MD  HPI: Patient is a 64 year old male now out 4 months having completed salvage radiation therapy for stage IIIa Gleason 9 adenocarcinoma the prostate seen today in routine follow-up he is doing well.  He specifically denies any increased lower urinary tract symptoms diarrhea or fatigue.Marland Kitchen  His PSA 1 year prior was 6.4 it is this week less than [id number].  COMPLICATIONS OF TREATMENT: none  FOLLOW UP COMPLIANCE: keeps appointments   PHYSICAL EXAM:  BP 129/83   Pulse 90   Temp 97.8 F (36.6 C) (Tympanic)   Resp 20   Wt 216 lb 6.4 oz (98.2 kg)   BMI 34.93 kg/m  Well-developed well-nourished patient in NAD. HEENT reveals PERLA, EOMI, discs not visualized.  Oral cavity is clear. No oral mucosal lesions are identified. Neck is clear without evidence of cervical or supraclavicular adenopathy. Lungs are clear to A&P. Cardiac examination is essentially unremarkable with regular rate and rhythm without murmur rub or thrill. Abdomen is benign with no organomegaly or masses noted. Motor sensory and DTR levels are equal and symmetric in the upper and lower extremities. Cranial nerves II through XII are grossly intact. Proprioception is intact. No peripheral adenopathy or edema is identified. No motor or sensory levels are noted. Crude visual fields are within normal range.  RADIOLOGY RESULTS: No current films to review  PLAN: Present time is under excellent biochemical control of his prostate cancer status post salvage radiation.  I am pleased with his overall progress.  I have asked to see him back in 6 months for follow-up.   He continues close follow-up care with urology.  Patient knows to call with any concerns at any time.  I would like to take this opportunity to thank you for allowing me to participate in the care of your patient.Noreene Filbert, MD

## 2019-09-27 ENCOUNTER — Encounter: Payer: Self-pay | Admitting: Urology

## 2019-09-27 DIAGNOSIS — C61 Malignant neoplasm of prostate: Secondary | ICD-10-CM

## 2019-10-11 ENCOUNTER — Ambulatory Visit: Payer: 59 | Admitting: Urology

## 2019-10-19 ENCOUNTER — Telehealth: Payer: Self-pay | Admitting: Family Medicine

## 2019-10-21 LAB — HM DIABETES FOOT EXAM

## 2019-10-21 NOTE — Telephone Encounter (Signed)
Please schedule CPE with fasting labs prior with Dr. Bedsole.  

## 2019-10-22 ENCOUNTER — Other Ambulatory Visit
Admission: RE | Admit: 2019-10-22 | Discharge: 2019-10-22 | Disposition: A | Discharge status: Home / Self Care | Payer: 59 | Attending: Urology | Admitting: Urology

## 2019-10-22 DIAGNOSIS — C61 Malignant neoplasm of prostate: Secondary | ICD-10-CM | POA: Diagnosis not present

## 2019-10-23 LAB — PSA: Prostatic Specific Antigen: <0.01 ng/mL (ref 0.00–4.00)

## 2019-10-24 ENCOUNTER — Encounter: Payer: Self-pay | Admitting: Family Medicine

## 2019-10-24 ENCOUNTER — Ambulatory Visit (INDEPENDENT_AMBULATORY_CARE_PROVIDER_SITE_OTHER)
Admission: RE | Admit: 2019-10-24 | Discharge: 2019-10-24 | Disposition: A | Discharge status: Home / Self Care | Payer: 59 | Source: Ambulatory Visit | Attending: Family Medicine | Admitting: Family Medicine

## 2019-10-24 ENCOUNTER — Other Ambulatory Visit: Payer: Self-pay

## 2019-10-24 ENCOUNTER — Ambulatory Visit: Payer: 59 | Admitting: Family Medicine

## 2019-10-24 VITALS — BP 100/60 | HR 70 | Temp 96.7°F | Ht 66.5 in | Wt 207.5 lb

## 2019-10-24 DIAGNOSIS — M25531 Pain in right wrist: Secondary | ICD-10-CM

## 2019-10-24 DIAGNOSIS — E7800 Pure hypercholesterolemia, unspecified: Secondary | ICD-10-CM

## 2019-10-24 DIAGNOSIS — M25511 Pain in right shoulder: Secondary | ICD-10-CM | POA: Diagnosis not present

## 2019-10-24 DIAGNOSIS — M25571 Pain in right ankle and joints of right foot: Secondary | ICD-10-CM | POA: Insufficient documentation

## 2019-10-24 DIAGNOSIS — G8929 Other chronic pain: Secondary | ICD-10-CM

## 2019-10-24 DIAGNOSIS — M545 Low back pain, unspecified: Secondary | ICD-10-CM

## 2019-10-24 DIAGNOSIS — N1831 Chronic kidney disease, stage 3a: Secondary | ICD-10-CM | POA: Diagnosis not present

## 2019-10-24 DIAGNOSIS — E0821 Diabetes mellitus due to underlying condition with diabetic nephropathy: Secondary | ICD-10-CM

## 2019-10-24 DIAGNOSIS — E1122 Type 2 diabetes mellitus with diabetic chronic kidney disease: Secondary | ICD-10-CM

## 2019-10-24 DIAGNOSIS — E1121 Type 2 diabetes mellitus with diabetic nephropathy: Secondary | ICD-10-CM

## 2019-10-24 LAB — LIPID PANEL
Cholesterol: 164 mg/dL (ref 0–200)
HDL: 34.8 mg/dL — ABNORMAL LOW (ref >39.00)
LDL Cholesterol: 97 mg/dL (ref 0–99)
NonHDL: 129.01
Total CHOL/HDL Ratio: 5
Triglycerides: 161 mg/dL — ABNORMAL HIGH (ref 0.0–149.0)
VLDL: 32.2 mg/dL (ref 0.0–40.0)

## 2019-10-24 LAB — COMPREHENSIVE METABOLIC PANEL
ALT: 29 U/L (ref 0–53)
AST: 22 U/L (ref 0–37)
Albumin: 4.5 g/dL (ref 3.5–5.2)
Alkaline Phosphatase: 64 U/L (ref 39–117)
BUN: 19 mg/dL (ref 6–23)
CO2: 30 mEq/L (ref 19–32)
Calcium: 10.4 mg/dL (ref 8.4–10.5)
Chloride: 100 mEq/L (ref 96–112)
Creatinine, Ser: 1.32 mg/dL (ref 0.40–1.50)
GFR: 54.83 mL/min — ABNORMAL LOW (ref >60.00)
Glucose, Bld: 143 mg/dL — ABNORMAL HIGH (ref 70–99)
Potassium: 4.8 mEq/L (ref 3.5–5.1)
Sodium: 135 mEq/L (ref 135–145)
Total Bilirubin: 0.5 mg/dL (ref 0.2–1.2)
Total Protein: 7.1 g/dL (ref 6.0–8.3)

## 2019-10-24 LAB — HEMOGLOBIN A1C: Hgb A1c MFr Bld: 8.2 % — ABNORMAL HIGH (ref 4.6–6.5)

## 2019-10-24 MED ORDER — BETAMETHASONE DIPROPIONATE 0.05 % EX CREA: TOPICAL_CREAM | Freq: Two times a day (BID) | CUTANEOUS | 0 refills | Status: DC

## 2019-10-24 MED ORDER — HYDROCODONE-ACETAMINOPHEN 5-325 MG PO TABS: 1.0000 | ORAL_TABLET | Freq: Every day | ORAL | 0 refills | Status: DC | PRN

## 2019-10-24 NOTE — Patient Instructions (Addendum)
Please stop at the lab to have labs drawn. Voltaren cream on ankle.  We will call with X-ray results.

## 2019-10-24 NOTE — Progress Notes (Signed)
10/25/2019 4:04 PM   Dennis Hicks 1955/09/03 [id number]  Referring provider: Jinny Sanders, MD [place],  [place] [address] Chief Complaint  Patient presents with   Follow-up    62mth follow-up with PSA    HPI: Dennis Hicks is a 64 y.o. male with prostate cancer, stress incontinence and erectile dysfunction presents today for a 6 month follow up.   Prostate cancer Surgical pathology consistent with high risk Gleason 4+5 prostate cancer with invasion of the bladder neck, extraprostatic extension,negative margins, negative lymph nodes, no seminal vesicle involvement.pT3a N0 MX.Adjuvant XRT completed 01/2019. Most recent PSA remains undetectable at <0.01 as of 10/22/19.  PSA trend: Component     Latest Ref Rng & Units 06/23/2017 04/10/2019 05/29/2019 10/22/2019  Prostatic Specific Antigen     0.00 - 4.00 ng/mL 6.41 (H) <0.01 <0.01 <0.01    SUI Symptoms are increasing. He is wearing a incontinence device during the day. He has more leakage during the day.  He reports that he was doing well 6 months ago with almost no leakage.  Is been more active and also not is involved in doing his Kegel exercises previous.  He is turned, bothered by this.  Erectile dysfunction Failed PDE 5 inhibitors.  He is concerned with lack of spontaneity with the use of the injections.  Curious about other treatment options.  Denies any spontaneous erections.  He has noted a curvature in his penis since the use of the incontinent clip.  He is able to achieve orgasm without erections.   PMH: Past Medical History:  Diagnosis Date   Actinic keratosis    Borderline diabetes    Bronchitis    Diabetes mellitus without complication ([facility])    Headache    Heartburn    HLD (hyperlipidemia)    Hypertension    Melanoma ([name]) 2018   Right chest area resected   Pneumonia    x2 Aug. 2017   Psoriasis    Renal insufficiency     Surgical History: Past Surgical  History:  Procedure Laterality Date   FINGER GANGLION CYST EXCISION     PELVIC LYMPH NODE DISSECTION Bilateral 02/19/2018   Procedure: PELVIC LYMPH NODE DISSECTION;  Surgeon: Hollice Espy, MD;  Location: [facility] ORS;  Service: Urology;  Laterality: Bilateral;   ROBOT ASSISTED LAPAROSCOPIC RADICAL PROSTATECTOMY N/A 02/19/2018   Procedure: ROBOTIC ASSISTED LAPAROSCOPIC RADICAL PROSTATECTOMY;  Surgeon: Hollice Espy, MD;  Location: [facility] ORS;  Service: Urology;  Laterality: N/A;    Home Medications:  Allergies as of 10/25/2019      Reactions   Chocolate Other (See Comments)   Trigger Migraine   Ibuprofen Other (See Comments)   Kidney issues   Shrimp [shellfish Allergy] Other (See Comments)   Trigger Migraines      Medication List       Accurate as of October 25, 2019  4:04 PM. If you have any questions, ask your nurse or doctor.        acetaminophen 500 MG tablet Commonly known as: TYLENOL Take 1,000 mg by mouth every 6 (six) hours as needed (headache).   aspirin EC 81 MG tablet Take 324 mg by mouth daily as needed (headache).   benazepril-hydrochlorthiazide 20-25 MG tablet Commonly known as: LOTENSIN HCT TAKE 1 TABLET DAILY   betamethasone dipropionate 0.05 % cream Apply topically 2 (two) times daily.   Caverject Impulse 10 MCG injection Generic drug: alprostadil 10 mcg by Intracavitary route as needed for erectile dysfunction. use  no more than 3 times per week   alprostadil 1000 MCG pellet Commonly known as: Muse 1 each (1,000 mcg total) by Transurethral route as needed for erectile dysfunction. use no more than 3 times per week   famotidine 10 MG chewable tablet Commonly known as: PEPCID AC Chew 10 mg by mouth daily as needed for heartburn.   HYDROcodone-acetaminophen 5-325 MG tablet Commonly known as: NORCO/VICODIN Take 1 tablet by mouth daily as needed for moderate pain.   metFORMIN 500 MG 24 hr tablet Commonly known as: GLUCOPHAGE-XR TAKE 1 TABLET DAILY  WITH BREAKFAST   NONFORMULARY OR COMPOUNDED ITEM Trimix (30/1/10)-(Pap/Phent/PGE)  Test Dose 5ml vial  Qty #1 Refills 0  [place] [phone] Fax [phone]   sildenafil 100 MG tablet Commonly known as: VIAGRA Take 1 tablet (100 mg total) by mouth daily as needed for erectile dysfunction.   simvastatin 40 MG tablet Commonly known as: ZOCOR TAKE 1 TABLET DAILY   SUMAtriptan 100 MG tablet Commonly known as: IMITREX TAKE 1 TABLET BY MOUTH EVERY 2 HOURS AS NEEDED, MAX OF 2 TABLETS IN 24 HOURS       Allergies:  Allergies  Allergen Reactions   Chocolate Other (See Comments)    Trigger Migraine   Ibuprofen Other (See Comments)    Kidney issues   Shrimp [Shellfish Allergy] Other (See Comments)    Trigger Migraines    Family History: Family History  Problem Relation Age of Onset   Hypertension Mother    Coronary artery disease Father    Hypertension Father    Hyperlipidemia Father    Diabetes Father    Prostate cancer Neg Hx    Kidney cancer Neg Hx    Bladder Cancer Neg Hx     Social History:  reports that he has never smoked. He has never used smokeless tobacco. He reports current alcohol use. He reports previous drug use.   Physical Exam: BP 136/82    Pulse 91    Ht 5\' 7"  (1.702 m)    Wt 206 lb (93.4 kg)    BMI 32.26 kg/m   Constitutional:  Alert and oriented, No acute distress. HEENT: [place] AT, moist mucus membranes.  Trachea midline, no masses. Cardiovascular: No clubbing, cyanosis, or edema. Respiratory: Normal respiratory effort, no increased work of breathing. Skin: No rashes, bruises or suspicious lesions. Neurologic: Grossly intact, no focal deficits, moving all 4 extremities. Psychiatric: Normal mood and affect.  Laboratory Data:  Lab Results  Component Value Date   CREATININE 1.32 10/24/2019    Lab Results  Component Value Date   HGBA1C 8.2 (H) 10/24/2019     Assessment & Plan:    1. ED Patient is considering  penile implant. I discussed the risk and benefits of procedure. Patient understood. I will refer patient to Dr. Francesca Jewett at [facility] for further discussion regarding sling vs AUS vs penile implant.  Patient agreed.   2. Prostate cancer No evidence of disease. PSA remains undetectable. Will continue to monitor PSA every 6 months.  3. SUI Increased bothersome symptoms.  Wearing a incontinence device during the day.  Encouraged to resume pelvic floor exercises Advised to address this with Dr. Francesca Jewett as well, may be considered for a dual treatment of both his erectile dysfunction and incontinence concomitantly  F/u 6 months with [facility] [address], [place], [place] [address] [phone]  I, Selena Batten, am acting as a scribe for Dr. Hollice Espy.  I have reviewed  the above documentation for accuracy and completeness, and I agree with the above.   Hollice Espy, MD  I spent 30 total minutes on the day of the encounter including pre-visit review of the medical record, face-to-face time with the patient, and post visit ordering of labs/imaging/tests.

## 2019-10-24 NOTE — Assessment & Plan Note (Signed)
Focally ttp ove tendon on lateral ankle, lower leg. Very little fat cushoion. No pain unless leans on it. Treat with topical NSAID and time.

## 2019-10-24 NOTE — Assessment & Plan Note (Signed)
Eval with X-ray given snuff box ttp, likely tendonitis. Treat with topical diclofenac and home PT. Wear thumb spica brace.

## 2019-10-24 NOTE — Assessment & Plan Note (Signed)
Due for re-eval. Encouraged exercise, weight loss, healthy eating habits.  

## 2019-10-24 NOTE — Assessment & Plan Note (Signed)
Stable control on current regimen. No red flags for med misuse. UDS and contact renewed today.

## 2019-10-24 NOTE — Progress Notes (Addendum)
Chief Complaint  Patient presents with  . Chronic Pain Management    History of Present Illness: HPI   64 year old male presents for follow up chronic pain.  He is having 3 weeks of right wrist pain,  Started after using drill, external rotation agressive, initial swelling, better now.  Pain at  [facility] of thumb. Pain greatest with rotation, mainly right gradaully improving. Not wearing brace, using tylenol prn. occ waking him at night.  Indication for chronic opioid:  Chronic low back pain, shoulder pain and  Medication and dose: hydrocodone 3235 mg # pills per month:3-10 times per month... using  About 45 in 6 months Last UDS date: DUE Opioid Treatment Agreement signed (Y/N): Due Opioid Treatment Agreement last reviewed with patient:   Yes NCCSRS reviewed this encounter (include red flags):  no red flags  Diabetes: Due for lab re-eval. Lab Results  Component Value Date   HGBA1C 6.7 (H) 10/04/2018     This visit occurred during the SARS-CoV-2 public health emergency.  Safety protocols were in place, including screening questions prior to the visit, additional usage of staff PPE, and extensive cleaning of exam room while observing appropriate contact time as indicated for disinfecting solutions.   COVID 19 screen:  No recent travel or known exposure to COVID19 The patient denies respiratory symptoms of COVID 19 at this time. The importance of social distancing was discussed today.     Review of Systems  Constitutional: Negative for chills and fever.  HENT: Negative for congestion and ear pain.   Eyes: Negative for pain and redness.  Respiratory: Negative for cough and shortness of breath.   Cardiovascular: Negative for chest pain, palpitations and leg swelling.  Gastrointestinal: Negative for abdominal pain, blood in stool, constipation, diarrhea, nausea and vomiting.  Genitourinary: Negative for dysuria.  Musculoskeletal: Negative for falls and myalgias.  Skin: Negative  for rash.  Neurological: Negative for dizziness.  Psychiatric/Behavioral: Negative for depression. The patient is not nervous/anxious.       Past Medical History:  Diagnosis Date  . Actinic keratosis   . Borderline diabetes   . Bronchitis   . Diabetes mellitus without complication ([place])   . Headache   . Heartburn   . HLD (hyperlipidemia)   . Hypertension   . Melanoma ([place]) 2018   Right chest area resected  . Pneumonia    x2 Aug. 2017  . Psoriasis   . Renal insufficiency     reports that he has never smoked. He has never used smokeless tobacco. He reports current alcohol use. He reports previous drug use.   Current Outpatient Medications:  .  acetaminophen (TYLENOL) 500 MG tablet, Take 1,000 mg by mouth every 6 (six) hours as needed (headache)., Disp: , Rfl:  .  alprostadil (CAVERJECT IMPULSE) 10 MCG injection, 10 mcg by Intracavitary route as needed for erectile dysfunction. use no more than 3 times per week, Disp: 1 each, Rfl: 12 .  alprostadil (MUSE) 1000 MCG pellet, 1 each (1,000 mcg total) by Transurethral route as needed for erectile dysfunction. use no more than 3 times per week, Disp: 6 each, Rfl: 3 .  aspirin EC 81 MG tablet, Take 324 mg by mouth daily as needed (headache)., Disp: , Rfl:  .  benazepril-hydrochlorthiazide (LOTENSIN HCT) 20-25 MG tablet, TAKE 1 TABLET DAILY, Disp: 90 tablet, Rfl: 3 .  famotidine (PEPCID AC) 10 MG chewable tablet, Chew 10 mg by mouth daily as needed for heartburn. , Disp: , Rfl:  .  HYDROcodone-acetaminophen (NORCO/VICODIN) 5-325 MG tablet, Take 1 tablet by mouth daily as needed for moderate pain., Disp: 45 tablet, Rfl: 0 .  metFORMIN (GLUCOPHAGE-XR) 500 MG 24 hr tablet, TAKE 1 TABLET DAILY WITH BREAKFAST, Disp: 90 tablet, Rfl: 3 .  NONFORMULARY OR COMPOUNDED ITEM, Trimix (30/1/10)-(Pap/Phent/PGE)  Test Dose 18ml vial  Qty #1 [place] [phone] Fax [phone], Disp: 1 each, Rfl: 0 .  sildenafil (VIAGRA) 100 MG tablet,  Take 1 tablet (100 mg total) by mouth daily as needed for erectile dysfunction., Disp: 30 tablet, Rfl: 3 .  simvastatin (ZOCOR) 40 MG tablet, TAKE 1 TABLET DAILY, Disp: 90 tablet, Rfl: 0 .  SUMAtriptan (IMITREX) 100 MG tablet, TAKE 1 TABLET BY MOUTH EVERY 2 HOURS AS NEEDED, MAX OF 2 TABLETS IN 24 HOURS, Disp: 10 tablet, Rfl: 0   Observations/Objective: Blood pressure 100/60, pulse 70, temperature (!) 96.7 F (35.9 C), temperature source Temporal, height 5' 6.5" (1.689 m), weight 207 lb 8 oz (94.1 kg), SpO2 96 %.  Physical Exam Constitutional:      Appearance: He is well-developed.  HENT:     Head: Normocephalic.     Right Ear: Hearing normal.     Left Ear: Hearing normal.     Nose: Nose normal.  Neck:     Thyroid: No thyroid mass or thyromegaly.     Vascular: No carotid bruit.     Trachea: Trachea normal.  Cardiovascular:     Rate and Rhythm: Normal rate and regular rhythm.     Pulses: Normal pulses.     Heart sounds: Heart sounds not distant. No murmur heard.  No friction rub. No gallop.      Comments: No peripheral edema Pulmonary:     Effort: Pulmonary effort is normal. No respiratory distress.     Breath sounds: Normal breath sounds.  Musculoskeletal:     Right wrist: Tenderness and snuff box tenderness present. No swelling, deformity or crepitus. Normal range of motion.     Left wrist: Normal.     Right lower leg: No swelling. No edema.     Left lower leg: Tenderness present. No swelling or bony tenderness. No edema.     Comments: Diabetic foot exam: Normal inspection No skin breakdown No calluses  Normal DP pulses Normal sensation to light touch and monofilament Nails normal   negative Finkelstein on right   Skin:    General: Skin is warm and dry.     Findings: No rash.  Psychiatric:        Speech: Speech normal.        Behavior: Behavior normal.        Thought Content: Thought content normal.      Assessment and Plan Chronic low back pain Stable control  on current regimen. No red flags for med misuse. UDS and contact renewed today.  Encounter for chronic pain management No red flags for med misuse. UDS and contact renewed today.   Type 2 diabetes mellitus with renal complication (HCC) Due for re-eval Encouraged exercise, weight loss, healthy eating habits.   HYPERCHOLESTEROLEMIA Due for re-eval.  Acute pain of right wrist Eval with X-ray given snuff box ttp, likely tendonitis. Treat with topical diclofenac and home PT. Wear thumb spica brace.  Acute right ankle pain Focally ttp ove tendon on lateral ankle, lower leg. Very little fat cushoion. No pain unless leans on it. Treat with topical NSAID and time.     Eliezer Lofts, MD

## 2019-10-24 NOTE — Assessment & Plan Note (Signed)
No red flags for med misuse. UDS and contact renewed today.

## 2019-10-24 NOTE — Assessment & Plan Note (Signed)
Due for re-eval. 

## 2019-10-25 ENCOUNTER — Encounter: Payer: Self-pay | Admitting: Urology

## 2019-10-25 ENCOUNTER — Ambulatory Visit: Payer: 59 | Admitting: Urology

## 2019-10-25 VITALS — BP 136/82 | HR 91 | Ht 67.0 in | Wt 206.0 lb

## 2019-10-25 DIAGNOSIS — C61 Malignant neoplasm of prostate: Secondary | ICD-10-CM | POA: Diagnosis not present

## 2019-10-25 DIAGNOSIS — N393 Stress incontinence (female) (male): Secondary | ICD-10-CM

## 2019-10-25 DIAGNOSIS — N529 Male erectile dysfunction, unspecified: Secondary | ICD-10-CM

## 2019-10-25 LAB — DRUG MONITORING, PANEL 8 WITH CONFIRMATION, URINE
6 Acetylmorphine: NEGATIVE ng/mL (ref <10)
Alcohol Metabolites: NEGATIVE ng/mL
Amphetamines: NEGATIVE ng/mL (ref <500)
Benzodiazepines: NEGATIVE ng/mL (ref <100)
Buprenorphine, Urine: NEGATIVE ng/mL (ref <5)
Cocaine Metabolite: NEGATIVE ng/mL (ref <150)
Creatinine: 97.6 mg/dL
MDMA: NEGATIVE ng/mL (ref <500)
Marijuana Metabolite: NEGATIVE ng/mL (ref <20)
Opiates: NEGATIVE ng/mL (ref <100)
Oxidant: NEGATIVE ug/mL
Oxycodone: NEGATIVE ng/mL (ref <100)
pH: 5.6 (ref 4.5–9.0)

## 2019-10-25 LAB — DM TEMPLATE

## 2019-10-25 NOTE — Progress Notes (Signed)
Change made.

## 2019-10-28 NOTE — Telephone Encounter (Signed)
Appointment 9/24

## 2019-11-26 ENCOUNTER — Encounter: Payer: Self-pay | Admitting: Radiation Oncology

## 2019-11-26 MED ORDER — METFORMIN HCL ER 500 MG PO TB24: 1000.0000 mg | ORAL_TABLET | Freq: Every day | ORAL | 0 refills | Status: DC

## 2019-12-06 ENCOUNTER — Other Ambulatory Visit: Payer: 59

## 2019-12-13 ENCOUNTER — Encounter: Payer: Self-pay | Admitting: *Deleted

## 2019-12-13 ENCOUNTER — Encounter: Payer: Self-pay | Admitting: Urology

## 2019-12-13 ENCOUNTER — Encounter: Payer: 59 | Admitting: Family Medicine

## 2019-12-13 ENCOUNTER — Encounter: Payer: Self-pay | Admitting: Radiation Oncology

## 2019-12-13 ENCOUNTER — Ambulatory Visit
Admission: RE | Admit: 2019-12-13 | Discharge: 2019-12-13 | Disposition: A | Discharge status: Home / Self Care | Payer: 59 | Source: Ambulatory Visit | Attending: Radiation Oncology | Admitting: Radiation Oncology

## 2019-12-13 ENCOUNTER — Other Ambulatory Visit: Payer: Self-pay

## 2019-12-13 VITALS — BP 129/69 | HR 80 | Temp 96.6°F | Resp 16 | Wt 210.3 lb

## 2019-12-13 DIAGNOSIS — N529 Male erectile dysfunction, unspecified: Secondary | ICD-10-CM | POA: Diagnosis not present

## 2019-12-13 DIAGNOSIS — R32 Unspecified urinary incontinence: Secondary | ICD-10-CM | POA: Diagnosis not present

## 2019-12-13 DIAGNOSIS — C61 Malignant neoplasm of prostate: Secondary | ICD-10-CM | POA: Diagnosis not present

## 2019-12-13 DIAGNOSIS — Z923 Personal history of irradiation: Secondary | ICD-10-CM | POA: Diagnosis not present

## 2019-12-13 NOTE — Progress Notes (Signed)
Radiation Oncology Follow up Note  Name: Dennis Hicks   Date:   12/13/2019 MRN:  [id number] DOB: 1955/11/06    This 64 y.o. male presents to the clinic today for 18-month follow-up status post radiation therapy in a salvage mode status post robotic assisted prostatectomy for stage IIIa (T3a N0 M0) Gleason 9 (4+5) adenocarcinoma prostate.  REFERRING PROVIDER: Jinny Sanders, MD  HPI: Patient is a 64 year old male now out 10 months having completed salvage radiation therapy status post robotic assisted prostatectomy.  Seen today in routine follow-up doing fairly well his PSA is less than 0.01.  He does have problems with incontinence and is seeing a specialist in [place] about treatment options for that.  Also has significant erectile dysfunction nonresponsive to medication.  He is having no bowel problems..  COMPLICATIONS OF TREATMENT: none  FOLLOW UP COMPLIANCE: keeps appointments   PHYSICAL EXAM:  BP 129/69 (BP Location: Left Arm, Patient Position: Sitting)   Pulse 80   Temp (!) 96.6 F (35.9 C) (Tympanic)   Resp 16   Wt 210 lb 4.8 oz (95.4 kg)   BMI 32.94 kg/m  Well-developed well-nourished patient in NAD. HEENT reveals PERLA, EOMI, discs not visualized.  Oral cavity is clear. No oral mucosal lesions are identified. Neck is clear without evidence of cervical or supraclavicular adenopathy. Lungs are clear to A&P. Cardiac examination is essentially unremarkable with regular rate and rhythm without murmur rub or thrill. Abdomen is benign with no organomegaly or masses noted. Motor sensory and DTR levels are equal and symmetric in the upper and lower extremities. Cranial nerves II through XII are grossly intact. Proprioception is intact. No peripheral adenopathy or edema is identified. No motor or sensory levels are noted. Crude visual fields are within normal range.  RADIOLOGY RESULTS: No current films to review  PLAN: Present time patient is seeing specialist for his urinary  incontinence.  Also is considering a penile implant for his erectile dysfunction.  Both of this will be handled by urology.  He is under excellent biochemical control of his prostate cancer 10 months out from salvage radiation and pleased with his overall progress.  I have asked to see him back in 6 months for follow-up and will then start once year follow-up appointments.  I would like to take this opportunity to thank you for allowing me to participate in the care of your patient.Noreene Filbert, MD

## 2019-12-19 ENCOUNTER — Encounter: Payer: Self-pay | Admitting: Family Medicine

## 2019-12-19 ENCOUNTER — Other Ambulatory Visit: Payer: Self-pay

## 2019-12-19 ENCOUNTER — Ambulatory Visit (INDEPENDENT_AMBULATORY_CARE_PROVIDER_SITE_OTHER): Payer: 59 | Admitting: Family Medicine

## 2019-12-19 VITALS — BP 100/60 | HR 86 | Temp 97.3°F | Ht 66.25 in | Wt 207.8 lb

## 2019-12-19 DIAGNOSIS — E1122 Type 2 diabetes mellitus with diabetic chronic kidney disease: Secondary | ICD-10-CM

## 2019-12-19 DIAGNOSIS — I10 Essential (primary) hypertension: Secondary | ICD-10-CM | POA: Diagnosis not present

## 2019-12-19 DIAGNOSIS — E7800 Pure hypercholesterolemia, unspecified: Secondary | ICD-10-CM

## 2019-12-19 DIAGNOSIS — Z0000 Encounter for general adult medical examination without abnormal findings: Secondary | ICD-10-CM | POA: Diagnosis not present

## 2019-12-19 DIAGNOSIS — Z23 Encounter for immunization: Secondary | ICD-10-CM | POA: Diagnosis not present

## 2019-12-19 DIAGNOSIS — E1121 Type 2 diabetes mellitus with diabetic nephropathy: Secondary | ICD-10-CM

## 2019-12-19 DIAGNOSIS — N183 Chronic kidney disease, stage 3 unspecified: Secondary | ICD-10-CM

## 2019-12-19 DIAGNOSIS — N1831 Chronic kidney disease, stage 3a: Secondary | ICD-10-CM

## 2019-12-19 DIAGNOSIS — Z8546 Personal history of malignant neoplasm of prostate: Secondary | ICD-10-CM

## 2019-12-19 NOTE — Progress Notes (Signed)
Chief Complaint  Patient presents with  . Annual Exam    History of Present Illness: HPI  The patient is here for annual wellness exam and preventative care.     Currently treated for prostate cancer. Followed by urology.. s/p radical prostatectomy early 2020. S/P radiation. PSA at 0.  Diabetes:   Poor control on  Metformin low dose.. now on  2 tabs ( 1000 mg daily ) He is carb counting and following his diet. Lab Results  Component Value Date   HGBA1C 8.2 (H) 10/24/2019  Using medications without difficulties: Hypoglycemic episodes: Hyperglycemic episodes: Feet problems: no ulcers Blood Sugars averaging: not checking eye exam within last year:  Hypertension:   Low normal on ACEI. BP Readings from Last 3 Encounters:  12/19/19 100/60  12/13/19 129/69  10/25/19 136/82  Using medication without problems or lightheadedness: none Chest pain with exertion:none Edema:none Short of breath:none Average home BPs: Other issues:  Elevated Cholesterol: LDL at goal on simvastatin Lab Results  Component Value Date   CHOL 164 10/24/2019   HDL 34.80 (L) 10/24/2019   LDLCALC 97 10/24/2019   LDLDIRECT 158.2 01/03/2013   TRIG 161.0 (H) 10/24/2019   CHOLHDL 5 10/24/2019  Using medications without problems: none Muscle aches:  Diet compliance: moderate Exercise:  Off and on. Other complaints: Wt Readings from Last 3 Encounters:  12/19/19 207 lb 12 oz (94.2 kg)  12/13/19 210 lb 4.8 oz (95.4 kg)  10/25/19 206 lb (93.4 kg)     This visit occurred during the SARS-CoV-2 public health emergency.  Safety protocols were in place, including screening questions prior to the visit, additional usage of staff PPE, and extensive cleaning of exam room while observing appropriate contact time as indicated for disinfecting solutions.   COVID 19 screen:  No recent travel or known exposure to COVID19 The patient denies respiratory symptoms of COVID 19 at this time. The importance of social  distancing was discussed today.     Review of Systems  Constitutional: Negative for chills and fever.  HENT: Negative for congestion and ear pain.   Eyes: Negative for pain and redness.  Respiratory: Negative for cough and shortness of breath.   Cardiovascular: Negative for chest pain, palpitations and leg swelling.  Gastrointestinal: Negative for abdominal pain, blood in stool, constipation, diarrhea, nausea and vomiting.  Genitourinary: Negative for dysuria.  Musculoskeletal: Negative for falls and myalgias.  Skin: Negative for rash.  Neurological: Negative for dizziness.  Psychiatric/Behavioral: Negative for depression. The patient is not nervous/anxious.       Past Medical History:  Diagnosis Date  . Actinic keratosis   . Borderline diabetes   . Bronchitis   . Diabetes mellitus without complication ([name])   . Headache   . Heartburn   . HLD (hyperlipidemia)   . Hypertension   . Melanoma ([place]) 2018   Right chest area resected  . Pneumonia    x2 Aug. 2017  . Psoriasis   . Renal insufficiency     reports that he has never smoked. He has never used smokeless tobacco. He reports current alcohol use. He reports previous drug use.   Current Outpatient Medications:  .  acetaminophen (TYLENOL) 500 MG tablet, Take 1,000 mg by mouth every 6 (six) hours as needed (headache)., Disp: , Rfl:  .  alprostadil (CAVERJECT IMPULSE) 10 MCG injection, 10 mcg by Intracavitary route as needed for erectile dysfunction. use no more than 3 times per week, Disp: 1 each, Rfl: 12 .  alprostadil (MUSE) 1000  MCG pellet, 1 each (1,000 mcg total) by Transurethral route as needed for erectile dysfunction. use no more than 3 times per week, Disp: 6 each, Rfl: 3 .  aspirin EC 81 MG tablet, Take 324 mg by mouth daily as needed (headache)., Disp: , Rfl:  .  benazepril-hydrochlorthiazide (LOTENSIN HCT) 20-25 MG tablet, TAKE 1 TABLET DAILY, Disp: 90 tablet, Rfl: 3 .  betamethasone dipropionate 0.05 % cream,  Apply topically 2 (two) times daily., Disp: 30 g, Rfl: 0 .  famotidine (PEPCID AC) 10 MG chewable tablet, Chew 10 mg by mouth daily as needed for heartburn. , Disp: , Rfl:  .  HYDROcodone-acetaminophen (NORCO/VICODIN) 5-325 MG tablet, Take 1 tablet by mouth daily as needed for moderate pain., Disp: 45 tablet, Rfl: 0 .  metFORMIN (GLUCOPHAGE-XR) 500 MG 24 hr tablet, Take 2 tablets (1,000 mg total) by mouth daily with breakfast., Disp: 60 tablet, Rfl: 0 .  NONFORMULARY OR COMPOUNDED ITEM, Trimix (30/1/10)-(Pap/Phent/PGE)  Test Dose 45ml vial  Qty #1 Refills 0  [place] [phone] Fax [phone], Disp: 1 each, Rfl: 0 .  sildenafil (VIAGRA) 100 MG tablet, Take 1 tablet (100 mg total) by mouth daily as needed for erectile dysfunction., Disp: 30 tablet, Rfl: 3 .  simvastatin (ZOCOR) 40 MG tablet, TAKE 1 TABLET DAILY, Disp: 90 tablet, Rfl: 0 .  SUMAtriptan (IMITREX) 100 MG tablet, TAKE 1 TABLET BY MOUTH EVERY 2 HOURS AS NEEDED, MAX OF 2 TABLETS IN 24 HOURS, Disp: 10 tablet, Rfl: 0   Observations/Objective: Blood pressure 100/60, pulse 86, temperature (!) 97.3 F (36.3 C), temperature source Temporal, height 5' 6.25" (1.683 m), weight 207 lb 12 oz (94.2 kg), SpO2 97 %.  Physical Exam Constitutional:      Appearance: He is well-developed. He is obese.  HENT:     Head: Normocephalic.     Right Ear: Hearing normal.     Left Ear: Hearing normal.     Nose: Nose normal.  Neck:     Thyroid: No thyroid mass or thyromegaly.     Vascular: No carotid bruit.     Trachea: Trachea normal.  Cardiovascular:     Rate and Rhythm: Normal rate and regular rhythm.     Pulses: Normal pulses.     Heart sounds: Heart sounds not distant. No murmur heard.  No friction rub. No gallop.      Comments: No peripheral edema Pulmonary:     Effort: Pulmonary effort is normal. No respiratory distress.     Breath sounds: Normal breath sounds.  Skin:    General: Skin is warm and dry.     Findings: No rash.   Psychiatric:        Speech: Speech normal.        Behavior: Behavior normal.        Thought Content: Thought content normal.      Assessment and Plan   The patient's preventative maintenance and recommended screening tests for an annual wellness exam were reviewed in full today. Brought up to date unless services declined.  Counselled on the importance of diet, exercise, and its role in overall health and mortality. The patient's FH and SH was reviewed, including their home life, tobacco status, and drug and alcohol status.   Colon: great aunt and uncle with colon cancer, no first degree relative. Has decided to do Cologuard. Vaccines Tdap 2017, S/P COVID,  Flu vaccine, Uptodate with pneumonia, consider shingles XTG:GYIRSWNI cancer, S/P robotic prostatectomy, followed by URO HIV: Refused. Nonsmoker Hep  C: Done.  Type 2 diabetes mellitus with renal complication (HCC)  Follow CBGs on higher dose metfomrin.  History of prostate cancer  Followed by urology.  CKD stage 3 due to type 2 diabetes mellitus (HCC) Stable  HYPERCHOLESTEROLEMIA At goal on statin.  Benign hypertension  Follow BP.. low normal.. may need to lower med if  Any symptoms.   Eliezer Lofts, MD

## 2019-12-19 NOTE — Assessment & Plan Note (Signed)
Stable

## 2019-12-19 NOTE — Assessment & Plan Note (Signed)
Followed by urology.   

## 2019-12-19 NOTE — Assessment & Plan Note (Signed)
Follow CBGs on higher dose metfomrin.

## 2019-12-19 NOTE — Assessment & Plan Note (Signed)
Follow BP.. low normal.. may need to lower med if  Any symptoms.

## 2019-12-19 NOTE — Patient Instructions (Addendum)
Goal fasting blood sugar < 120, 2 hour after meal goal < 180.  If blood sugar not at goal on 2 tabs of metformin daily.. call.  Increase exercise as able.  Call if BP > 90/60 or 140/90.  Consider shingles vaccine

## 2019-12-19 NOTE — Assessment & Plan Note (Signed)
At goal on statin 

## 2019-12-19 NOTE — Addendum Note (Signed)
Addended by: Carter Kitten on: 12/19/2019 10:14 AM   Modules accepted: Orders

## 2019-12-22 ENCOUNTER — Other Ambulatory Visit: Payer: Self-pay | Admitting: Family Medicine

## 2020-01-20 ENCOUNTER — Other Ambulatory Visit: Payer: Self-pay | Admitting: Family Medicine

## 2020-04-01 ENCOUNTER — Other Ambulatory Visit: Payer: Self-pay | Admitting: Family Medicine

## 2020-04-01 DIAGNOSIS — N1831 Chronic kidney disease, stage 3a: Secondary | ICD-10-CM

## 2020-04-01 DIAGNOSIS — E1122 Type 2 diabetes mellitus with diabetic chronic kidney disease: Secondary | ICD-10-CM

## 2020-04-02 ENCOUNTER — Other Ambulatory Visit: Payer: Self-pay

## 2020-04-02 ENCOUNTER — Other Ambulatory Visit (INDEPENDENT_AMBULATORY_CARE_PROVIDER_SITE_OTHER): Payer: 59

## 2020-04-02 DIAGNOSIS — E1122 Type 2 diabetes mellitus with diabetic chronic kidney disease: Secondary | ICD-10-CM | POA: Diagnosis not present

## 2020-04-02 DIAGNOSIS — N1831 Chronic kidney disease, stage 3a: Secondary | ICD-10-CM | POA: Diagnosis not present

## 2020-04-02 LAB — COMPREHENSIVE METABOLIC PANEL
ALT: 22 U/L (ref 0–53)
AST: 19 U/L (ref 0–37)
Albumin: 4.5 g/dL (ref 3.5–5.2)
Alkaline Phosphatase: 47 U/L (ref 39–117)
BUN: 21 mg/dL (ref 6–23)
CO2: 29 mEq/L (ref 19–32)
Calcium: 9 mg/dL (ref 8.4–10.5)
Chloride: 101 mEq/L (ref 96–112)
Creatinine, Ser: 1.2 mg/dL (ref 0.40–1.50)
GFR: 64.52 mL/min (ref >60.00)
Glucose, Bld: 120 mg/dL — ABNORMAL HIGH (ref 70–99)
Potassium: 4 mEq/L (ref 3.5–5.1)
Sodium: 135 mEq/L (ref 135–145)
Total Bilirubin: 0.4 mg/dL (ref 0.2–1.2)
Total Protein: 7.2 g/dL (ref 6.0–8.3)

## 2020-04-02 LAB — HEMOGLOBIN A1C: Hgb A1c MFr Bld: 7.1 % — ABNORMAL HIGH (ref 4.6–6.5)

## 2020-04-03 ENCOUNTER — Encounter: Payer: Self-pay | Admitting: Family Medicine

## 2020-04-03 ENCOUNTER — Ambulatory Visit: Payer: 59 | Admitting: Family Medicine

## 2020-04-03 VITALS — BP 120/70 | HR 66 | Temp 97.1°F | Ht 66.25 in | Wt 204.0 lb

## 2020-04-03 DIAGNOSIS — K219 Gastro-esophageal reflux disease without esophagitis: Secondary | ICD-10-CM

## 2020-04-03 DIAGNOSIS — I10 Essential (primary) hypertension: Secondary | ICD-10-CM

## 2020-04-03 DIAGNOSIS — G8929 Other chronic pain: Secondary | ICD-10-CM

## 2020-04-03 DIAGNOSIS — N1831 Chronic kidney disease, stage 3a: Secondary | ICD-10-CM

## 2020-04-03 DIAGNOSIS — M545 Low back pain, unspecified: Secondary | ICD-10-CM | POA: Diagnosis not present

## 2020-04-03 DIAGNOSIS — E1122 Type 2 diabetes mellitus with diabetic chronic kidney disease: Secondary | ICD-10-CM | POA: Diagnosis not present

## 2020-04-03 MED ORDER — HYDROCODONE-ACETAMINOPHEN 5-325 MG PO TABS: 1.0000 | ORAL_TABLET | Freq: Every day | ORAL | 0 refills | Status: DC | PRN

## 2020-04-03 NOTE — Assessment & Plan Note (Signed)
Stable, chronic.  Continue current medication.    

## 2020-04-03 NOTE — Assessment & Plan Note (Signed)
Slight increase in use but not daily. Using for flares. PDMP reviewed during this encounter. No red flags.

## 2020-04-03 NOTE — Assessment & Plan Note (Signed)
Improving, chronic.  Continue current medication. Encouraged exercise, weight loss, healthy eating habits.

## 2020-04-03 NOTE — Progress Notes (Signed)
Patient ID: Dennis Hicks, male    DOB: July 07, 1955, 65 y.o.   MRN: [id number]  This visit was conducted in person.  BP 120/70   Pulse 66   Temp (!) 97.1 F (36.2 C) (Temporal)   Ht 5' 6.25" (1.683 m)   Wt 204 lb (92.5 kg)   SpO2 97%   BMI 32.68 kg/m    CC:  DM follow up, chronic pain management Subjective:   HPI: Dennis Hicks is a 65 y.o. male presenting on 04/03/2020 for Diabetes  Diabetes:  Improved control on current regimen A1C down from 8.2 to 7.1  he has made significant change with carbs in diet.  Metformin 1000 mg daily. Using medications without difficulties: Hypoglycemic episodes: Hyperglycemic episodes: Feet problems: Blood Sugars averaging: eye exam within last year:   Work out 35 min 3-6 days a week.  Has noted no CP, no SOB.. has noted occ spells of irregular heart rate off and on.. no dizziness. Has history of palpitations.  4 glasses of caffeine a day.  Wt Readings from Last 3 Encounters:  04/03/20 204 lb (92.5 kg)  12/19/19 207 lb 12 oz (94.2 kg)  12/13/19 210 lb 4.8 oz (95.4 kg)    Indication for chronic opioid:  Chronic back pain, right shoulder pain Medication and dose: hydrocodone  apap 5/325 daily pr # pills per month: 10-12 times per month... using about 45 in 4 months Last UDS date: 10/24/2019 Opioid Treatment Agreement signed (Y/N): Y Opioid Treatment Agreement last reviewed with patient:   04/03/20 NCCSRS reviewed this encounter (include red flags):   none 04/03/20     Elevated calcium at last check: 15.. repeat eval today Ca 9  Currently treated for prostate cancer. Followed by urology.. s/p radical prostatectomy early 2020. S/P radiation. PSA at 0.  Using Tums.  Relevant past medical, surgical, family and social history reviewed and updated as indicated. Interim medical history since our last visit reviewed. Allergies and medications reviewed and updated. Outpatient Medications Prior to Visit  Medication Sig Dispense Refill   . acetaminophen (TYLENOL) 500 MG tablet Take 1,000 mg by mouth every 6 (six) hours as needed (headache).    . alprostadil (CAVERJECT IMPULSE) 10 MCG injection 10 mcg by Intracavitary route as needed for erectile dysfunction. use no more than 3 times per week 1 each 12  . alprostadil (MUSE) 1000 MCG pellet 1 each (1,000 mcg total) by Transurethral route as needed for erectile dysfunction. use no more than 3 times per week 6 each 3  . aspirin EC 81 MG tablet Take 324 mg by mouth daily as needed (headache).    . benazepril-hydrochlorthiazide (LOTENSIN HCT) 20-25 MG tablet TAKE 1 TABLET DAILY 90 tablet 3  . betamethasone dipropionate 0.05 % cream Apply topically 2 (two) times daily. 30 g 0  . famotidine (PEPCID AC) 10 MG chewable tablet Chew 10 mg by mouth daily as needed for heartburn.     Marland Kitchen HYDROcodone-acetaminophen (NORCO/VICODIN) 5-325 MG tablet Take 1 tablet by mouth daily as needed for moderate pain. 45 tablet 0  . metFORMIN (GLUCOPHAGE-XR) 500 MG 24 hr tablet Take 2 tablets (1,000 mg total) by mouth daily with breakfast. 180 tablet 1  . NONFORMULARY OR COMPOUNDED ITEM Trimix (30/1/10)-(Pap/Phent/PGE)  Test Dose 9ml vial  Qty #1 Refills 0  [facility] [phone] Fax [phone] 1 each 0  . sildenafil (VIAGRA) 100 MG tablet Take 1 tablet (100 mg total) by mouth daily as needed for erectile dysfunction. 30  tablet 3  . simvastatin (ZOCOR) 40 MG tablet TAKE 1 TABLET DAILY (NEEDS OFFICE VISIT) 90 tablet 3  . SUMAtriptan (IMITREX) 100 MG tablet TAKE 1 TABLET BY MOUTH EVERY 2 HOURS AS NEEDED, MAX OF 2 TABLETS IN 24 HOURS 10 tablet 0   No facility-administered medications prior to visit.     Per HPI unless specifically indicated in ROS section below Review of Systems  Constitutional: Negative for fatigue and fever.  HENT: Negative for ear pain.   Eyes: Negative for pain.  Respiratory: Negative for cough and shortness of breath.   Cardiovascular: Negative for chest pain,  palpitations and leg swelling.  Gastrointestinal: Negative for abdominal pain.  Genitourinary: Negative for dysuria.  Musculoskeletal: Negative for arthralgias.  Neurological: Negative for syncope, light-headedness and headaches.  Psychiatric/Behavioral: Negative for dysphoric mood.   Objective:  BP 120/70   Pulse 66   Temp (!) 97.1 F (36.2 C) (Temporal)   Ht 5' 6.25" (1.683 m)   Wt 204 lb (92.5 kg)   SpO2 97%   BMI 32.68 kg/m   Wt Readings from Last 3 Encounters:  04/03/20 204 lb (92.5 kg)  12/19/19 207 lb 12 oz (94.2 kg)  12/13/19 210 lb 4.8 oz (95.4 kg)      Physical Exam Constitutional:      General: Vital signs are normal.     Appearance: He is well-developed and well-nourished. He is obese.  HENT:     Head: Normocephalic.     Right Ear: Hearing normal.     Left Ear: Hearing normal.     Nose: Nose normal.     Mouth/Throat:     Mouth: Oropharynx is clear and moist and mucous membranes are normal.  Neck:     Thyroid: No thyroid mass or thyromegaly.     Vascular: No carotid bruit.     Trachea: Trachea normal.  Cardiovascular:     Rate and Rhythm: Normal rate and regular rhythm.     Pulses: Normal pulses.     Heart sounds: Heart sounds not distant. No murmur heard. No friction rub. No gallop.      Comments: No peripheral edema Pulmonary:     Effort: Pulmonary effort is normal. No respiratory distress.     Breath sounds: Normal breath sounds.  Skin:    General: Skin is warm, dry and intact.     Findings: No rash.  Psychiatric:        Mood and Affect: Mood and affect normal.        Speech: Speech normal.        Behavior: Behavior normal.        Thought Content: Thought content normal.       Results for orders placed or performed in visit on 04/02/20  Comprehensive metabolic panel  Result Value Ref Range   Sodium 135 135 - 145 mEq/L   Potassium 4.0 3.5 - 5.1 mEq/L   Chloride 101 96 - 112 mEq/L   CO2 29 19 - 32 mEq/L   Glucose, Bld 120 (H) 70 - 99 mg/dL    BUN 21 6 - 23 mg/dL   Creatinine, Ser 1.20 0.40 - 1.50 mg/dL   Total Bilirubin 0.4 0.2 - 1.2 mg/dL   Alkaline Phosphatase 47 39 - 117 U/L   AST 19 0 - 37 U/L   ALT 22 0 - 53 U/L   Total Protein 7.2 6.0 - 8.3 g/dL   Albumin 4.5 3.5 - 5.2 g/dL   GFR 64.52 >60.00 mL/min  Calcium 9.0 8.4 - 10.5 mg/dL  Hemoglobin A1c  Result Value Ref Range   Hgb A1c MFr Bld 7.1 (H) 4.6 - 6.5 %    This visit occurred during the SARS-CoV-2 public health emergency.  Safety protocols were in place, including screening questions prior to the visit, additional usage of staff PPE, and extensive cleaning of exam room while observing appropriate contact time as indicated for disinfecting solutions.   COVID 19 screen:  No recent travel or known exposure to COVID19 The patient denies respiratory symptoms of COVID 19 at this time. The importance of social distancing was discussed today.   Assessment and Plan    Problem List Items Addressed This Visit    Benign hypertension (Chronic)    Stable, chronic.  Continue current medication.         Chronic low back pain (Chronic)     Stable, chronic.  Continue current medication.         Relevant Medications   HYDROcodone-acetaminophen (NORCO/VICODIN) 5-325 MG tablet   Encounter for chronic pain management     Slight increase in use but not daily. Using for flares. PDMP reviewed during this encounter. No red flags.      GERD (Chronic)     Decrease Tums as likely contributing to elevation of calcium. Now resolved.      Type 2 diabetes mellitus with renal complication (HCC) - Primary (Chronic)    Improving, chronic.  Continue current medication. Encouraged exercise, weight loss, healthy eating habits.            Meds ordered this encounter  Medications  . HYDROcodone-acetaminophen (NORCO/VICODIN) 5-325 MG tablet    Sig: Take 1 tablet by mouth daily as needed for moderate pain.    Dispense:  45 tablet    Refill:  0     Eliezer Lofts, MD

## 2020-04-03 NOTE — Assessment & Plan Note (Signed)
Decrease Tums as likely contributing to elevation of calcium. Now resolved.

## 2020-04-03 NOTE — Patient Instructions (Addendum)
Cut back on caffeine and increase water.  Follow up if palpitations are not improving for consideration of heart monitor. Keep working on healthy eating, exercise and weight loss.

## 2020-04-13 NOTE — Telephone Encounter (Signed)
Patient called stating that he sent Dr. Diona Browner a mychart message today because he tested positive for covid yesterday. Patient stated that his cough has gotten worse today and he would like medication called in. Patient was advised that we would need to at least schedule a virtual visit to determine what medications he would need. Patient stated that he has had a cough and low grade fever. Patient denies any SOB or difficulty breathing. Patient was scheduled for a virtual visit with Dr. Diona Browner 04/14/20 at 12:40 pm. Patient was given ER precautions and he verbalized understanding. Patient was also given information on the [facility] in [place] if he felt like he needed to be seen before the appointment but did not feel that he needed to go to the ER.

## 2020-04-14 ENCOUNTER — Encounter: Payer: Self-pay | Admitting: Family Medicine

## 2020-04-14 ENCOUNTER — Other Ambulatory Visit (INDEPENDENT_AMBULATORY_CARE_PROVIDER_SITE_OTHER): Payer: 59

## 2020-04-14 ENCOUNTER — Telehealth (INDEPENDENT_AMBULATORY_CARE_PROVIDER_SITE_OTHER): Payer: 59 | Admitting: Family Medicine

## 2020-04-14 VITALS — BP 120/69 | HR 86 | Temp 99.8°F | Ht 66.25 in | Wt 198.4 lb

## 2020-04-14 DIAGNOSIS — U071 COVID-19: Secondary | ICD-10-CM

## 2020-04-14 DIAGNOSIS — R059 Cough, unspecified: Secondary | ICD-10-CM | POA: Diagnosis not present

## 2020-04-14 LAB — POCT INFLUENZA A/B
Influenza A, POC: NEGATIVE
Influenza B, POC: NEGATIVE

## 2020-04-14 MED ORDER — PROMETHAZINE-DM 6.25-15 MG/5ML PO SYRP: 5.0000 mL | ORAL_SOLUTION | Freq: Every evening | ORAL | 0 refills | Status: DC | PRN

## 2020-04-14 NOTE — Progress Notes (Signed)
Lab Appointment scheduled today at 2:30 pm with Covid & Flu Testing.  Back door information provided.

## 2020-04-14 NOTE — Progress Notes (Signed)
VIRTUAL VISIT Due to national recommendations of social distancing due to [place] 19, a virtual visit is felt to be most appropriate for this patient at this time.   I connected with the patient on 04/14/20 at 12:40 PM EST by virtual telehealth platform and verified that I am speaking with the correct person using two identifiers.   I discussed the limitations, risks, security and privacy concerns of performing an evaluation and management service by  virtual telehealth platform and the availability of in person appointments. I also discussed with the patient that there may be a patient responsible charge related to this service. The patient expressed understanding and agreed to proceed.  Patient location: Home Provider Location: [facility] Participants: Eliezer Lofts and Charlynne Pander   Chief Complaint  Patient presents with  . Covid Positive    History of Present Illness: 65 year old male with history of  DM,  CKD, HTN and new COVID infection.   Started with sore throat, congestion 04/10/20   Low grade fever 1/22... 99.55F  Started cough, dry.Marland Kitchen keeping him up at night. Hoarse voice.  No SOB, no wheeze. Myalgia and body ache... chest down to abdomen sore. Pushing fluids. Normal UOP.  Lightheaded when has fever.   This feels like bronchitis he has had in past.  Using OTC cough meds.  COVID 19 screen COVID testing:postitive home test.. but very faint line.. he is doubting test. COVID vaccine:01/01/2020 , 06/30/2019 , 06/09/2019 COVID exposure: No recent travel or known exposure to [place]  The importance of social distancing was discussed today.     Review of Systems  Constitutional: Negative for chills and fever.  HENT: Positive for congestion and sore throat. Negative for ear pain.   Eyes: Negative for pain and redness.  Respiratory: Positive for cough. Negative for shortness of breath.   Cardiovascular: Negative for chest pain, palpitations and leg swelling.   Gastrointestinal: Negative for abdominal pain, blood in stool, constipation, diarrhea, nausea and vomiting.  Genitourinary: Negative for dysuria.  Musculoskeletal: Negative for falls and myalgias.  Skin: Negative for rash.  Neurological: Negative for dizziness.  Psychiatric/Behavioral: Negative for depression. The patient is not nervous/anxious.       Past Medical History:  Diagnosis Date  . Actinic keratosis   . Borderline diabetes   . Bronchitis   . Diabetes mellitus without complication ([place])   . Headache   . Heartburn   . HLD (hyperlipidemia)   . Hypertension   . Melanoma ([place]) 2018   Right chest area resected  . Pneumonia    x2 Aug. 2017  . Psoriasis   . Renal insufficiency     reports that he has never smoked. He has never used smokeless tobacco. He reports current alcohol use. He reports previous drug use.   Current Outpatient Medications:  .  alprostadil (CAVERJECT IMPULSE) 10 MCG injection, 10 mcg by Intracavitary route as needed for erectile dysfunction. use no more than 3 times per week, Disp: 1 each, Rfl: 12 .  alprostadil (MUSE) 1000 MCG pellet, 1 each (1,000 mcg total) by Transurethral route as needed for erectile dysfunction. use no more than 3 times per week, Disp: 6 each, Rfl: 3 .  benazepril-hydrochlorthiazide (LOTENSIN HCT) 20-25 MG tablet, TAKE 1 TABLET DAILY, Disp: 90 tablet, Rfl: 3 .  betamethasone dipropionate 0.05 % cream, Apply topically 2 (two) times daily., Disp: 30 g, Rfl: 0 .  famotidine (PEPCID AC) 10 MG chewable tablet, Chew 10 mg by mouth daily as needed  for heartburn. , Disp: , Rfl:  .  HYDROcodone-acetaminophen (NORCO/VICODIN) 5-325 MG tablet, Take 1 tablet by mouth daily as needed for moderate pain., Disp: 45 tablet, Rfl: 0 .  metFORMIN (GLUCOPHAGE-XR) 500 MG 24 hr tablet, Take 2 tablets (1,000 mg total) by mouth daily with breakfast., Disp: 180 tablet, Rfl: 1 .  NONFORMULARY OR COMPOUNDED ITEM, Trimix (30/1/10)-(Pap/Phent/PGE)  Test Dose 78ml  vial  Qty #1 [place] [phone] Fax [phone], Disp: 1 each, Rfl: 0 .  sildenafil (VIAGRA) 100 MG tablet, Take 1 tablet (100 mg total) by mouth daily as needed for erectile dysfunction., Disp: 30 tablet, Rfl: 3 .  simvastatin (ZOCOR) 40 MG tablet, TAKE 1 TABLET DAILY (NEEDS OFFICE VISIT), Disp: 90 tablet, Rfl: 3 .  SUMAtriptan (IMITREX) 100 MG tablet, TAKE 1 TABLET BY MOUTH EVERY 2 HOURS AS NEEDED, MAX OF 2 TABLETS IN 24 HOURS, Disp: 10 tablet, Rfl: 0   Observations/Objective: Blood pressure 120/69, pulse 86, temperature 99.8 F (37.7 C), temperature source Oral, height 5' 6.25" (1.683 m), weight 198 lb 6 oz (90 kg), SpO2 96 %.  Physical Exam  Physical Exam Constitutional:      General: The patient is not in acute distress. Pulmonary:     Effort: Pulmonary effort is normal. No respiratory distress.  Neurological:     Mental Status: The patient is alert and oriented to person, place, and time.  Psychiatric:        Mood and Affect: Mood normal.        Behavior: Behavior normal.  Assessment and Plan Problem List Items Addressed This Visit    COVID-19 virus infection - Primary     Likely COVID19  infection vs other viral infection. No clear sign of bacterial infection at this time.  Recommend testing .. info on how to obtain testing provided through [place]. No SOB.  No red flags/need for ER visit or in-person exam at respiratory clinic at this time..    Pt moderate/high risk for COVID complications given HTN, DM, obesity and age. If SOB begins symptoms worsening.. have low threshold for in-person exam, if severe shortness of breath ER visit recommended.  Can monitor Oxygen saturation at home with home monitor if able to obtain.  Go to ER if O2 sat < 90% on room air.  Reviewed home care and provided information through [facility].  Recommended quarantine until test returns. If returns positive 5 days isolation recommended. Return to work day 6 and wear mask for  4 more days to complete 10 days. Provided info about prevention of spread of COVID 19.       Relevant Orders   POC Influenza A&B(BINAX/QUICKVUE)   Novel Coronavirus, NAA ([facility]) (Completed)     Addendum.Marland Kitchen COVID re-test positive.. referred to [facility] outpatient care team given high risk for complications.   I discussed the assessment and treatment plan with the patient. The patient was provided an opportunity to ask questions and all were answered. The patient agreed with the plan and demonstrated an understanding of the instructions.   The patient was advised to call back or seek an in-person evaluation if the symptoms worsen or if the condition fails to improve as anticipated.     Eliezer Lofts, MD

## 2020-04-14 NOTE — Patient Instructions (Signed)
Call in 24 hours if interested in referral for MAB infusion or antivirals.  Can use cough suppressant at night.  We will try to set you up for drive through testing this afternoon. If we cannot get get you in this afternoon.. call a testing.   COVID19 Testing:   1. [facility]:  [facility] [place], [place], [place], [address] [email or web address]   2. [place]: [email or web address] or call [phone]   3.[facility]:  [email or web address] or [phone]

## 2020-04-16 ENCOUNTER — Telehealth: Payer: Self-pay

## 2020-04-16 DIAGNOSIS — U071 COVID-19: Secondary | ICD-10-CM

## 2020-04-16 HISTORY — DX: COVID-19: U07.1

## 2020-04-16 LAB — SARS-COV-2, NAA 2 DAY TAT

## 2020-04-16 LAB — NOVEL CORONAVIRUS, NAA: SARS-CoV-2, NAA: DETECTED — AB

## 2020-04-16 NOTE — Assessment & Plan Note (Signed)
Likely COVID19  infection vs other viral infection. No clear sign of bacterial infection at this time.  Recommend testing .. info on how to obtain testing provided through [place]. No SOB.  No red flags/need for ER visit or in-person exam at respiratory clinic at this time..    Pt moderate/high risk for COVID complications given HTN, DM, obesity and age. If SOB begins symptoms worsening.. have low threshold for in-person exam, if severe shortness of breath ER visit recommended.  Can monitor Oxygen saturation at home with home monitor if able to obtain.  Go to ER if O2 sat < 90% on room air.  Reviewed home care and provided information through [place].  Recommended quarantine until test returns. If returns positive 5 days isolation recommended. Return to work day 6 and wear mask for 4 more days to complete 10 days. Provided info about prevention of spread of COVID 19.

## 2020-04-16 NOTE — Telephone Encounter (Signed)
Zackarey notified that referral has been placed for outpt Covid treatment.

## 2020-04-16 NOTE — Telephone Encounter (Signed)
Pt left v/m; pt had video visit on 04/14/20 and pt tested + covid and pt was to cb in 24 hrs to see if Dr Diona Browner would recommend a MAB infusion or not. Pt does want to get the MAB infusion. Pt said he feels better but still has low grade fever of 99.1 and not taking fever reducing med. Pt said he does have prod cough with yellowish brown phlegm. Throat is no longer sore. Pt had a mild h/a last night but no h/a today. Pt is sore in chest and back due to so much coughing but coughing is better now. Pt has hx of pneumonia.Pt continues with head congestion and pt is taking mucinex. UC & ED precautions given and pt voiced understanding. Pt request cb after reviewed by Dr Diona Browner. Sending note to DR Diona Browner and Butch [name] CMA.

## 2020-04-16 NOTE — Telephone Encounter (Signed)
Pt requested call when referral sent... referral for outpt COVID treatment sent.

## 2020-04-17 NOTE — Telephone Encounter (Signed)
Replied to pt via my chart.

## 2020-04-24 ENCOUNTER — Other Ambulatory Visit: Payer: Self-pay | Admitting: Family Medicine

## 2020-04-27 ENCOUNTER — Other Ambulatory Visit: Payer: Self-pay

## 2020-04-27 ENCOUNTER — Encounter: Payer: Self-pay | Admitting: Urology

## 2020-04-27 DIAGNOSIS — C61 Malignant neoplasm of prostate: Secondary | ICD-10-CM

## 2020-04-30 ENCOUNTER — Other Ambulatory Visit
Admission: RE | Admit: 2020-04-30 | Discharge: 2020-04-30 | Disposition: A | Discharge status: Home / Self Care | Payer: 59 | Attending: Urology | Admitting: Urology

## 2020-04-30 ENCOUNTER — Other Ambulatory Visit: Payer: Self-pay

## 2020-04-30 DIAGNOSIS — C61 Malignant neoplasm of prostate: Secondary | ICD-10-CM | POA: Diagnosis present

## 2020-04-30 LAB — PSA: Prostatic Specific Antigen: <0.01 ng/mL (ref 0.00–4.00)

## 2020-05-01 ENCOUNTER — Ambulatory Visit: Payer: 59 | Admitting: Urology

## 2020-05-01 ENCOUNTER — Telehealth: Payer: Self-pay | Admitting: *Deleted

## 2020-05-01 DIAGNOSIS — C61 Malignant neoplasm of prostate: Secondary | ICD-10-CM

## 2020-05-01 NOTE — Telephone Encounter (Addendum)
Patient informed, voiced understanding. Rescheduled follow up appointment for 6 months. Orders placed.  ----- Message from Hollice Espy, MD sent at 05/01/2020 10:00 AM EST ----- PSA remains undetectable.  If he is doing okay, we can reschedule his visit with me in 6 months with another PSA prior.  If he has questions or concerns, he can follow-up as scheduled.  Hollice Espy, MD

## 2020-05-08 ENCOUNTER — Ambulatory Visit: Payer: 59 | Admitting: Urology

## 2020-05-11 ENCOUNTER — Encounter: Payer: Self-pay | Admitting: Dermatology

## 2020-05-11 ENCOUNTER — Other Ambulatory Visit: Payer: Self-pay

## 2020-05-11 ENCOUNTER — Ambulatory Visit: Payer: 59 | Admitting: Dermatology

## 2020-05-11 DIAGNOSIS — L570 Actinic keratosis: Secondary | ICD-10-CM | POA: Diagnosis not present

## 2020-05-11 DIAGNOSIS — Z85820 Personal history of malignant melanoma of skin: Secondary | ICD-10-CM | POA: Diagnosis not present

## 2020-05-11 DIAGNOSIS — L814 Other melanin hyperpigmentation: Secondary | ICD-10-CM

## 2020-05-11 DIAGNOSIS — L918 Other hypertrophic disorders of the skin: Secondary | ICD-10-CM

## 2020-05-11 DIAGNOSIS — L821 Other seborrheic keratosis: Secondary | ICD-10-CM

## 2020-05-11 DIAGNOSIS — D229 Melanocytic nevi, unspecified: Secondary | ICD-10-CM

## 2020-05-11 DIAGNOSIS — Z1283 Encounter for screening for malignant neoplasm of skin: Secondary | ICD-10-CM

## 2020-05-11 DIAGNOSIS — D1800 Hemangioma unspecified site: Secondary | ICD-10-CM

## 2020-05-11 DIAGNOSIS — I781 Nevus, non-neoplastic: Secondary | ICD-10-CM | POA: Diagnosis not present

## 2020-05-11 DIAGNOSIS — D225 Melanocytic nevi of trunk: Secondary | ICD-10-CM | POA: Diagnosis not present

## 2020-05-11 DIAGNOSIS — L578 Other skin changes due to chronic exposure to nonionizing radiation: Secondary | ICD-10-CM

## 2020-05-11 NOTE — Progress Notes (Signed)
Follow-Up Visit   Subjective  Dennis Hicks is a 65 y.o. male who presents for the following: Total body skin exam (Hx of Melanoma R lat chest, hx of AKs).  He has had AKs frozen off scalp in past.  No new or changing lesions.   The following portions of the chart were reviewed this encounter and updated as appropriate:       Review of Systems:  No other skin or systemic complaints except as noted in HPI or Assessment and Plan.  Objective  Well appearing patient in no apparent distress; mood and affect are within normal limits.  A full examination was performed including scalp, head, eyes, ears, nose, lips, neck, chest, axillae, abdomen, back, buttocks, bilateral upper extremities, bilateral lower extremities, hands, feet, fingers, toes, fingernails, and toenails. All findings within normal limits unless otherwise noted below.  Right medial chest  Objective  Right medial chest: 0.5cm med brown macule  Objective  Right lat chest: Well healed scar with no evidence of recurrence   Objective  frontal scalp x 3, R upper arm x 1, R temple x 1, R lat eyebrow x 1, L ear helix x 1, occipital scalp x 6 (13): Pink scaly macules   Objective  mid face: Dilated blood vessels   Assessment & Plan    Lentigines - Scattered tan macules - Discussed due to sun exposure - Benign, observe - Call for any changes  Seborrheic Keratoses - Stuck-on, waxy, tan-brown papules and plaques  - Discussed benign etiology and prognosis. - Observe - Call for any changes  Melanocytic Nevi - Tan-brown and/or pink-flesh-colored symmetric macules and papules - Benign appearing on exam today - Observation - Call clinic for new or changing moles - Recommend daily use of broad spectrum spf 30+ sunscreen to sun-exposed areas.   Hemangiomas - Red papules - Discussed benign nature - Observe - Call for any changes  Actinic Damage - Chronic, secondary to cumulative UV/sun exposure - diffuse  scaly erythematous macules with underlying dyspigmentation - Recommend daily broad spectrum sunscreen SPF 30+ to sun-exposed areas, reapply every 2 hours as needed.  - Call for new or changing lesions.  Skin cancer screening performed today.  Acrochordons (Skin Tags) - Fleshy, skin-colored pedunculated papules - Benign appearing.  - Observe. - If desired, they can be removed with an in office procedure that is not covered by insurance. - Please call the clinic if you notice any new or changing lesions.  Nevus Right medial chest  Benign-appearing.  Observation.  Call clinic for new or changing moles.  Recommend daily use of broad spectrum spf 30+ sunscreen to sun-exposed areas.    History of melanoma Right lat chest  Clark's level III/early IV, Breslow's 0.66mm Excised 02/20/2012  Clear. Observe for recurrence. Call clinic for new or changing lesions.  Recommend regular skin exams, daily broad-spectrum spf 30+ sunscreen use, and photoprotection.     AK (actinic keratosis) (13) frontal scalp x 3, R upper arm x 1, R temple x 1, R lat eyebrow x 1, L ear helix x 1, occipital scalp x 6  Destruction of lesion - frontal scalp x 3, R upper arm x 1, R temple x 1, R lat eyebrow x 1, L ear helix x 1, occipital scalp x 6  Destruction method: cryotherapy   Informed consent: discussed and consent obtained   Lesion destroyed using liquid nitrogen: Yes   Region frozen until ice ball extended beyond lesion: Yes   Outcome: patient tolerated  procedure well with no complications   Post-procedure details: wound care instructions given    Telangiectasia mid face  Benign, observe  Return in about 6 months (around 11/08/2020) for TBSE, Hx of Melanoma, Hx of AKs.  I, Othelia Pulling, RMA, am acting as scribe for Brendolyn Patty, MD . Documentation: I have reviewed the above documentation for accuracy and completeness, and I agree with the above.  Brendolyn Patty MD

## 2020-05-12 ENCOUNTER — Encounter: Payer: Self-pay | Admitting: Dermatology

## 2020-05-22 ENCOUNTER — Telehealth: Payer: Self-pay | Admitting: Radiation Oncology

## 2020-05-22 NOTE — Telephone Encounter (Signed)
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 Baruch Gouty.

## 2020-06-11 ENCOUNTER — Telehealth: Payer: Self-pay | Admitting: Family Medicine

## 2020-06-11 DIAGNOSIS — N1831 Chronic kidney disease, stage 3a: Secondary | ICD-10-CM

## 2020-06-11 DIAGNOSIS — E7800 Pure hypercholesterolemia, unspecified: Secondary | ICD-10-CM

## 2020-06-11 NOTE — Telephone Encounter (Signed)
-----   Message from Cloyd Stagers, RT sent at 06/08/2020 11:36 AM EDT ----- Regarding: Lab Orders for Monday 4.4.2022 Please place lab orders for Monday 4.4.2022, office visit for physical on Friday 4.8.2022 Thank you, Dyke Maes RT(R)

## 2020-06-12 ENCOUNTER — Ambulatory Visit: Payer: 59 | Admitting: Radiation Oncology

## 2020-06-22 ENCOUNTER — Other Ambulatory Visit: Payer: Self-pay

## 2020-06-22 ENCOUNTER — Other Ambulatory Visit: Payer: Self-pay | Admitting: Family Medicine

## 2020-06-22 ENCOUNTER — Other Ambulatory Visit (INDEPENDENT_AMBULATORY_CARE_PROVIDER_SITE_OTHER): Payer: 59

## 2020-06-22 DIAGNOSIS — N1831 Chronic kidney disease, stage 3a: Secondary | ICD-10-CM

## 2020-06-22 DIAGNOSIS — E1122 Type 2 diabetes mellitus with diabetic chronic kidney disease: Secondary | ICD-10-CM

## 2020-06-22 NOTE — Addendum Note (Signed)
Addended by: Ellamae Sia on: 06/22/2020 08:12 AM   Modules accepted: Orders

## 2020-06-23 LAB — LIPID PANEL
Chol/HDL Ratio: 5.5 ratio — ABNORMAL HIGH (ref 0.0–5.0)
Cholesterol, Total: 175 mg/dL (ref 100–199)
HDL: 32 mg/dL — ABNORMAL LOW (ref >39)
LDL Chol Calc (NIH): 116 mg/dL — ABNORMAL HIGH (ref 0–99)
Triglycerides: 152 mg/dL — ABNORMAL HIGH (ref 0–149)
VLDL Cholesterol Cal: 27 mg/dL (ref 5–40)

## 2020-06-23 LAB — COMPREHENSIVE METABOLIC PANEL
ALT: 31 IU/L (ref 0–44)
AST: 30 IU/L (ref 0–40)
Albumin/Globulin Ratio: 1.8 (ref 1.2–2.2)
Albumin: 4.6 g/dL (ref 3.8–4.8)
Alkaline Phosphatase: 63 IU/L (ref 44–121)
BUN/Creatinine Ratio: 14 (ref 10–24)
BUN: 20 mg/dL (ref 8–27)
Bilirubin Total: 0.4 mg/dL (ref 0.0–1.2)
CO2: 20 mmol/L (ref 20–29)
Calcium: 9.7 mg/dL (ref 8.6–10.2)
Chloride: 97 mmol/L (ref 96–106)
Creatinine, Ser: 1.42 mg/dL — ABNORMAL HIGH (ref 0.76–1.27)
Globulin, Total: 2.6 g/dL (ref 1.5–4.5)
Glucose: 138 mg/dL — ABNORMAL HIGH (ref 65–99)
Potassium: 4.1 mmol/L (ref 3.5–5.2)
Sodium: 136 mmol/L (ref 134–144)
Total Protein: 7.2 g/dL (ref 6.0–8.5)
eGFR: 56 mL/min/{1.73_m2} — ABNORMAL LOW (ref >59)

## 2020-06-23 LAB — HEMOGLOBIN A1C
Est. average glucose Bld gHb Est-mCnc: 151 mg/dL
Hgb A1c MFr Bld: 6.9 % — ABNORMAL HIGH (ref 4.8–5.6)

## 2020-06-23 NOTE — Progress Notes (Signed)
No critical labs need to be addressed urgently. We will discuss labs in detail at upcoming office visit.   

## 2020-06-26 ENCOUNTER — Other Ambulatory Visit: Payer: Self-pay

## 2020-06-26 ENCOUNTER — Ambulatory Visit (INDEPENDENT_AMBULATORY_CARE_PROVIDER_SITE_OTHER): Payer: 59 | Admitting: Family Medicine

## 2020-06-26 ENCOUNTER — Encounter: Payer: Self-pay | Admitting: Family Medicine

## 2020-06-26 VITALS — BP 122/78 | HR 85 | Temp 97.1°F | Ht 66.25 in | Wt 201.0 lb

## 2020-06-26 DIAGNOSIS — N1831 Chronic kidney disease, stage 3a: Secondary | ICD-10-CM

## 2020-06-26 DIAGNOSIS — E1159 Type 2 diabetes mellitus with other circulatory complications: Secondary | ICD-10-CM

## 2020-06-26 DIAGNOSIS — E1122 Type 2 diabetes mellitus with diabetic chronic kidney disease: Secondary | ICD-10-CM

## 2020-06-26 DIAGNOSIS — E1169 Type 2 diabetes mellitus with other specified complication: Secondary | ICD-10-CM | POA: Diagnosis not present

## 2020-06-26 DIAGNOSIS — G8929 Other chronic pain: Secondary | ICD-10-CM

## 2020-06-26 DIAGNOSIS — N029 Recurrent and persistent hematuria with unspecified morphologic changes: Secondary | ICD-10-CM

## 2020-06-26 DIAGNOSIS — N183 Chronic kidney disease, stage 3 unspecified: Secondary | ICD-10-CM

## 2020-06-26 DIAGNOSIS — Z0000 Encounter for general adult medical examination without abnormal findings: Secondary | ICD-10-CM

## 2020-06-26 DIAGNOSIS — I152 Hypertension secondary to endocrine disorders: Secondary | ICD-10-CM

## 2020-06-26 DIAGNOSIS — E785 Hyperlipidemia, unspecified: Secondary | ICD-10-CM

## 2020-06-26 NOTE — Patient Instructions (Addendum)
Keep up with water intake. Keep up the great work on lifestyle changes... try to incorporate low fat diet.  Call to set up heart monitor if palpitations recur.  Decrease caffeine at night. Set up yearly eye exam for diabetes and have the opthalmologist send [place] a copy of the evaluation for the chart.  Look into weekly Trulicity and decrease to one metfomrin an day.  Return Cologuard as soon as able.

## 2020-06-26 NOTE — Assessment & Plan Note (Signed)
Improved control with  Lifestyle changes.  Given decreased renal function metformin use was brought up... will consider change to Trulicity and only 1 metformin daily at next OV.

## 2020-06-26 NOTE — Progress Notes (Signed)
Patient ID: Dennis Hicks, male    DOB: [birth year], 65 y.o.   MRN: [id number]  This visit was conducted in person.  Temp (!) 97.1 F (36.2 C) (Temporal)   Ht 5' 6.25" (1.683 m)   Wt 201 lb (91.2 kg)   BMI 32.20 kg/m    CC:  Chief Complaint  Patient presents with  . Annual Exam    Subjective:   HPI: Dennis Hicks is a 65 y.o. male presenting on 06/26/2020 for Annual Exam  Hematuria: noted once after strenuous exercise. None since. Has seen urology for similar in 2018.   Palpitations.. he followed BP and rate.. it seemed to improve after traveling and eating more carb.  Hypertension:   good control  BP Readings from Last 3 Encounters:  06/26/20 122/78  04/14/20 120/69  04/03/20 120/70  Using medication without problems or lightheadedness: none Chest pain with exertion:none Edema:none Short of breath:  none Average home BPs: Other issues:   CKD due to DM: Cr slightly increased at 1.42... baseline more 1.2-1.3   Elevated Cholesterol:  Not at goal on simvastatin... has been ating more meats given trying low carb diet. Lab Results  Component Value Date   CHOL 175 06/22/2020   HDL 32 (L) 06/22/2020   LDLCALC 116 (H) 06/22/2020   LDLDIRECT 158.2 01/03/2013   TRIG 152 (H) 06/22/2020   CHOLHDL 5.5 (H) 06/22/2020  Using medications without problems: Muscle aches:  Diet compliance: Exercise: Other complaints:  Diabetes:  At goal on metformin 2 tabs daily Lab Results  Component Value Date   HGBA1C 6.9 (H) 06/22/2020  Using medications without difficulties: Hypoglycemic episodes: Hyperglycemic episodes: Feet problems: none Blood Sugars averaging: at goal eye exam within last year: due  Prostate cancer: followed by oncology,  Indication for chronic opioid:  Low back pain and neck pain Medication and dose:  Hydrocodone 5/325 mg daily prn. # pills per  3 month: 45 Has not needed to use very often has plenty. Last UDS date: 10/24/2019 Opioid Treatment  Agreement signed (Y/N): 10/24/2019 Opioid Treatment Agreement last reviewed with patient:   06/26/20 N[facility] reviewed this encounter (include red flags):        Wt Readings from Last 3 Encounters:  06/26/20 201 lb (91.2 kg)  04/14/20 198 lb 6 oz (90 kg)  04/03/20 204 lb (92.5 kg)     Relevant past medical, surgical, family and social history reviewed and updated as indicated. Interim medical history since our last visit reviewed. Allergies and medications reviewed and updated. Outpatient Medications Prior to Visit  Medication Sig Dispense Refill  . alprostadil (CAVERJECT IMPULSE) 10 MCG injection 10 mcg by Intracavitary route as needed for erectile dysfunction. use no more than 3 times per week 1 each 12  . alprostadil (MUSE) 1000 MCG pellet 1 each (1,000 mcg total) by Transurethral route as needed for erectile dysfunction. use no more than 3 times per week 6 each 3  . benazepril-hydrochlorthiazide (LOTENSIN HCT) 20-25 MG tablet TAKE 1 TABLET DAILY 90 tablet 3  . betamethasone dipropionate 0.05 % cream Apply topically 2 (two) times daily. 30 g 0  . famotidine (PEPCID AC) 10 MG chewable tablet Chew 10 mg by mouth daily as needed for heartburn.     Marland Kitchen HYDROcodone-acetaminophen (NORCO/VICODIN) 5-325 MG tablet Take 1 tablet by mouth daily as needed for moderate pain. 45 tablet 0  . metFORMIN (GLUCOPHAGE-XR) 500 MG 24 hr tablet TAKE 2 TABLETS DAILY WITH BREAKFAST 180 tablet 3  . NONFORMULARY  OR COMPOUNDED ITEM Trimix (30/1/10)-(Pap/Phent/PGE)  Test Dose 59m vial  Qty #1 Refills 0  [facility] [phone] [phone] each 0  . sildenafil (VIAGRA) 100 MG tablet Take 1 tablet (100 mg total) by mouth daily as needed for erectile dysfunction. 30 tablet 3  . simvastatin (ZOCOR) 40 MG tablet TAKE 1 TABLET DAILY (NEEDS OFFICE VISIT) 90 tablet 3  . SUMAtriptan (IMITREX) 100 MG tablet TAKE 1 TABLET BY MOUTH EVERY 2 HOURS AS NEEDED, MAX OF 2 TABLETS IN 24 HOURS 10 tablet 0  .  promethazine-dextromethorphan (PROMETHAZINE-DM) 6.25-15 MG/5ML syrup Take 5 mLs by mouth at bedtime as needed for cough. 118 mL 0   No facility-administered medications prior to visit.     Per HPI unless specifically indicated in ROS section below Review of Systems  Constitutional: Negative for fatigue and fever.  HENT: Negative for ear pain.   Eyes: Negative for pain.  Respiratory: Negative for cough and shortness of breath.   Cardiovascular: Negative for chest pain, palpitations and leg swelling.  Gastrointestinal: Negative for abdominal pain.  Genitourinary: Negative for dysuria.  Musculoskeletal: Negative for arthralgias.  Neurological: Negative for syncope, light-headedness and headaches.  Psychiatric/Behavioral: Negative for dysphoric mood.   Objective:  Temp (!) 97.1 F (36.2 C) (Temporal)   Ht 5' 6.25" (1.683 m)   Wt 201 lb (91.2 kg)   BMI 32.20 kg/m   Wt Readings from Last 3 Encounters:  06/26/20 201 lb (91.2 kg)  04/14/20 198 lb 6 oz (90 kg)  04/03/20 204 lb (92.5 kg)      Physical Exam Constitutional:      General: He is not in acute distress.    Appearance: Normal appearance. He is well-developed. He is not ill-appearing or toxic-appearing.  HENT:     Head: Normocephalic and atraumatic.     Right Ear: Hearing, tympanic membrane, ear canal and external ear normal. No tenderness. No foreign body. Tympanic membrane is not retracted or bulging.     Left Ear: Hearing, tympanic membrane, ear canal and external ear normal. No tenderness. No foreign body. Tympanic membrane is not retracted or bulging.     Nose: Nose normal. No mucosal edema or rhinorrhea.     Right Sinus: No maxillary sinus tenderness or frontal sinus tenderness.     Left Sinus: No maxillary sinus tenderness or frontal sinus tenderness.     Mouth/Throat:     Dentition: Normal dentition. No dental caries.     Pharynx: Uvula midline. No oropharyngeal exudate.     Tonsils: No tonsillar abscesses.  Eyes:      General: Lids are normal. Lids are everted, no foreign bodies appreciated.     Conjunctiva/sclera: Conjunctivae normal.     Pupils: Pupils are equal, round, and reactive to light.  Neck:     Thyroid: No thyroid mass or thyromegaly.     Vascular: No carotid bruit.     Trachea: Trachea and phonation normal.  Cardiovascular:     Rate and Rhythm: Normal rate and regular rhythm.     Pulses: Normal pulses.     Heart sounds: Normal heart sounds, S1 normal and S2 normal. No murmur heard. No gallop.   Pulmonary:     Effort: Pulmonary effort is normal. No respiratory distress.     Breath sounds: Normal breath sounds. No wheezing, rhonchi or rales.  Abdominal:     General: Bowel sounds are normal.     Palpations: Abdomen is soft.     Tenderness: There is  no abdominal tenderness. There is no guarding or rebound.     Hernia: No hernia is present.  Musculoskeletal:     Cervical back: Normal range of motion and neck supple.  Lymphadenopathy:     Cervical: No cervical adenopathy.  Skin:    General: Skin is warm and dry.     Findings: No rash.  Neurological:     Mental Status: He is alert.     Cranial Nerves: No cranial nerve deficit.     Sensory: No sensory deficit.     Gait: Gait normal.     Deep Tendon Reflexes: Reflexes are normal and symmetric.  Psychiatric:        Speech: Speech normal.        Behavior: Behavior normal.        Judgment: Judgment normal.       Results for orders placed or performed in visit on 06/22/20  Hemoglobin A1c  Result Value Ref Range   Hgb A1c MFr Bld 6.9 (H) 4.8 - 5.6 %   Est. average glucose Bld gHb Est-mCnc 151 mg/dL  Lipid panel  Result Value Ref Range   Cholesterol, Total 175 100 - 199 mg/dL   Triglycerides 152 (H) 0 - 149 mg/dL   HDL 32 (L) >39 mg/dL   VLDL Cholesterol Cal 27 5 - 40 mg/dL   LDL Chol Calc (NIH) 116 (H) 0 - 99 mg/dL   Chol/HDL Ratio 5.5 (H) 0.0 - 5.0 ratio  Comprehensive metabolic panel  Result Value Ref Range   Glucose 138  (H) 65 - 99 mg/dL   BUN 20 8 - 27 mg/dL   Creatinine, Ser 1.42 (H) 0.76 - 1.27 mg/dL   eGFR 56 (L) >59 mL/min/1.73   BUN/Creatinine Ratio 14 10 - 24   Sodium 136 134 - 144 mmol/L   Potassium 4.1 3.5 - 5.2 mmol/L   Chloride 97 96 - 106 mmol/L   CO2 20 20 - 29 mmol/L   Calcium 9.7 8.6 - 10.2 mg/dL   Total Protein 7.2 6.0 - 8.5 g/dL   Albumin 4.6 3.8 - 4.8 g/dL   Globulin, Total 2.6 1.5 - 4.5 g/dL   Albumin/Globulin Ratio 1.8 1.2 - 2.2   Bilirubin Total 0.4 0.0 - 1.2 mg/dL   Alkaline Phosphatase 63 44 - 121 IU/L   AST 30 0 - 40 IU/L   ALT 31 0 - 44 IU/L    This visit occurred during the SARS-CoV-2 public health emergency.  Safety protocols were in place, including screening questions prior to the visit, additional usage of staff PPE, and extensive cleaning of exam room while observing appropriate contact time as indicated for disinfecting solutions.   COVID 19 screen:  No recent travel or known exposure to COVID19 The patient denies respiratory symptoms of COVID 19 at this time. The importance of social distancing was discussed today.   Assessment and Plan The patient's preventative maintenance and recommended screening tests for an annual wellness exam were reviewed in full today. Brought up to date unless services declined.  Counselled on the importance of diet, exercise, and its role in overall health and mortality. The patient's FH and SH was reviewed, including their home life, tobacco status, and drug and alcohol status.   Colon: great aunt and uncle with colon cancer, no first degree relative. Has decided to do Cologuard... has not returned yet. VaccinesTdap 2017, S/P COVID,  Flu vaccine, Uptodate with pneumonia, consider shingles ZJQ:BHALPFXT cancer, S/P robotic prostatectomy, followed by URO HIV: Refused.  Nonsmoker Hep C: Done.   Due for yearly eye exam.  Problem List Items Addressed This Visit    CKD stage 3 due to type 2 diabetes mellitus ([place]) (Chronic)    Due  to Dm... keep u[p with water intake and avoid NSAIDs      Encounter for chronic pain management    Using minimally. No refills needed.       Hyperlipidemia associated with type 2 diabetes mellitus (HCC) (Chronic)    Stable, chronic.  Continue current medication. Slightly worse given higher proteins in low carb diet.Marland Kitchen discussed heart healthy diet.  Simvastatin 40 mg daily      Hypertension associated with diabetes (HCC) (Chronic)    Stable, chronic.  Continue current medication.   benazapril HCTZ 20/25 mg daily      Idiopathic hematuria    Resolved.      Type 2 diabetes mellitus with renal complication (HCC) (Chronic)    Improved control with  Lifestyle changes.  Given decreased renal function metformin use was brought up... will consider change to Trulicity and only 1 metformin daily at next OV.       Other Visit Diagnoses    Routine general medical examination at a health care facility    -  Primary       Eliezer Lofts, MD

## 2020-06-26 NOTE — Assessment & Plan Note (Signed)
Using minimally. No refills needed.

## 2020-06-26 NOTE — Assessment & Plan Note (Signed)
Stable, chronic.  Continue current medication.   benazapril HCTZ 20/25 mg daily

## 2020-06-26 NOTE — Assessment & Plan Note (Addendum)
Stable, chronic.  Continue current medication. Slightly worse given higher proteins in low carb diet.Marland Kitchen discussed heart healthy diet.  Simvastatin 40 mg daily

## 2020-07-29 NOTE — Assessment & Plan Note (Signed)
Due to Dm... keep u[p with water intake and avoid NSAIDs

## 2020-07-29 NOTE — Assessment & Plan Note (Signed)
Resolved

## 2020-08-21 ENCOUNTER — Ambulatory Visit (INDEPENDENT_AMBULATORY_CARE_PROVIDER_SITE_OTHER): Payer: 59

## 2020-08-21 ENCOUNTER — Other Ambulatory Visit: Payer: Self-pay

## 2020-08-21 ENCOUNTER — Encounter: Payer: Self-pay | Admitting: Emergency Medicine

## 2020-08-21 ENCOUNTER — Ambulatory Visit
Admission: EM | Admit: 2020-08-21 | Discharge: 2020-08-21 | Disposition: A | Discharge status: Home / Self Care | Payer: 59 | Attending: Emergency Medicine | Admitting: Emergency Medicine

## 2020-08-21 ENCOUNTER — Telehealth: Payer: Self-pay

## 2020-08-21 DIAGNOSIS — J40 Bronchitis, not specified as acute or chronic: Secondary | ICD-10-CM

## 2020-08-21 DIAGNOSIS — R059 Cough, unspecified: Secondary | ICD-10-CM | POA: Diagnosis not present

## 2020-08-21 MED ORDER — PREDNISONE 20 MG PO TABS: 40.0000 mg | ORAL_TABLET | Freq: Every day | ORAL | 0 refills | Status: AC

## 2020-08-21 MED ORDER — AZITHROMYCIN 250 MG PO TABS: ORAL_TABLET | ORAL | 0 refills | Status: AC

## 2020-08-21 NOTE — Telephone Encounter (Signed)
Pt left VM and I called pt back. He states that he went to UC in [place] today and they diagnosed him with bronchitis. They prescribed azithromycin and prednisone. Pt was unaware that they didn't send in any cough medicine. I instructed pt to call the UC and tell them he needs something for cough as they were they ones who treated and diagnosed him. Pt got upset and said he wasn't gonna call them because it took him 2 hours to get through there today. I told pt it would be faster as a phone call. Forwarding to PCP as an [place].

## 2020-08-21 NOTE — ED Provider Notes (Signed)
[facility]    CSN: [id number] Arrival date & time: 08/21/20  1059      History   Chief Complaint Chief Complaint  Patient presents with  . Cough    HPI Dennis Hicks is a 65 y.o. male.   Dennis Hicks presents with complaints of cough which has been ongoing now for approximately 10 days. Originally was more productive, now has a harder time bringing up mucus. No nasal drainage, sore throat or ear pain. Low grade temps in the 99's. No shortness of breath or difficulty breathing. No leg swelling or any peripheral edema. Took his mother's left over ciprofloxacin for approximately 4 days and last night took one of his wife's left over amoxicillin. Soft stool this morning. No other gi symptoms. No history of asthma or copd, doesn't smoke. Has been taking mucinex d which hasn't helped. History of pneumonia. He had covid-19 in January of 2022   ROS per HPI, negative if not otherwise mentioned.      Past Medical History:  Diagnosis Date  . Actinic keratosis   . Borderline diabetes   . Bronchitis   . COVID-19 virus infection 04/16/2020  . Diabetes mellitus without complication ([place])   . Headache   . Heartburn   . HLD (hyperlipidemia)   . Hypertension   . Melanoma ([place]) 01/24/2012   R lat chest, Clark's level III/early IV, Breslow's 0.36mm, excised 02/20/2012  . Pneumonia    x2 Aug. 2017  . Psoriasis   . Renal insufficiency     Patient Active Problem List   Diagnosis Date Noted  . Acute right ankle pain 10/24/2019  . Acute pain of right wrist 10/24/2019  . Eustachian tube dysfunction, bilateral 10/09/2018  . Left hip pain 10/09/2018  . History of prostate cancer 02/19/2018  . Idiopathic hematuria 04/26/2016  . Chronic right shoulder pain 11/29/2013  . CKD stage 3 due to type 2 diabetes mellitus ([place]) 11/29/2013  . Type 2 diabetes mellitus with renal complication ([place]) 79/89/2119  . Encounter for chronic pain management 10/13/2010  . Hyperlipidemia  associated with type 2 diabetes mellitus ([place]) 09/23/2009  . MIGRAINE, COMMON 09/23/2009  . Hypertension associated with diabetes ([place]) 09/23/2009  . GERD 09/23/2009  . ERECTILE DYSFUNCTION, ORGANIC 09/23/2009  . DERMATITIS, ATOPIC 09/23/2009  . Chronic low back pain 09/23/2009    Past Surgical History:  Procedure Laterality Date  . FINGER GANGLION CYST EXCISION    . PELVIC LYMPH NODE DISSECTION Bilateral 02/19/2018   Procedure: PELVIC LYMPH NODE DISSECTION;  Surgeon: Hollice Espy, MD;  Location: [facility] ORS;  Service: Urology;  Laterality: Bilateral;  . ROBOT ASSISTED LAPAROSCOPIC RADICAL PROSTATECTOMY N/A 02/19/2018   Procedure: ROBOTIC ASSISTED LAPAROSCOPIC RADICAL PROSTATECTOMY;  Surgeon: Hollice Espy, MD;  Location: [facility] ORS;  Service: Urology;  Laterality: N/A;       Home Medications    Prior to Admission medications   Medication Sig Start Date End Date Taking? Authorizing Provider  azithromycin (ZITHROMAX) 250 MG tablet Take 2 tablets (500 mg total) by mouth daily for 1 day, THEN 1 tablet (250 mg total) daily for 4 days. 08/21/20 08/26/20 Yes Anise Harbin, Lanelle Bal B, NP  benazepril-hydrochlorthiazide (LOTENSIN HCT) 20-25 MG tablet TAKE 1 TABLET DAILY 04/24/20  Yes Bedsole, Amy E, MD  metFORMIN (GLUCOPHAGE-XR) 500 MG 24 hr tablet TAKE 2 TABLETS DAILY WITH BREAKFAST 06/22/20  Yes Bedsole, Amy E, MD  predniSONE (DELTASONE) 20 MG tablet Take 2 tablets (40 mg total) by mouth daily with breakfast for 5 days.  08/21/20 08/26/20 Yes Shatina Streets, Lanelle Bal B, NP  simvastatin (ZOCOR) 40 MG tablet TAKE 1 TABLET DAILY (NEEDS OFFICE VISIT) 01/20/20  Yes Bedsole, Amy E, MD  alprostadil (CAVERJECT IMPULSE) 10 MCG injection 10 mcg by Intracavitary route as needed for erectile dysfunction. use no more than 3 times per week 12/13/18   Zara Council A, PA-C  alprostadil (MUSE) 1000 MCG pellet 1 each (1,000 mcg total) by Transurethral route as needed for erectile dysfunction. use no more than 3 times per week 05/06/19    Zara Council A, PA-C  betamethasone dipropionate 0.05 % cream Apply topically 2 (two) times daily. 10/24/19   Bedsole, Amy E, MD  famotidine (PEPCID AC) 10 MG chewable tablet Chew 10 mg by mouth daily as needed for heartburn.     [provider]  HYDROcodone-acetaminophen (NORCO/VICODIN) 5-325 MG tablet Take 1 tablet by mouth daily as needed for moderate pain. 04/03/20   Jinny Sanders, MD  NONFORMULARY OR COMPOUNDED ITEM Trimix (30/1/10)-(Pap/Phent/PGE)  Test Dose 72ml vial  Qty #1 [place] [phone] Fax [phone] 10/09/18   Hollice Espy, MD  sildenafil (VIAGRA) 100 MG tablet Take 1 tablet (100 mg total) by mouth daily as needed for erectile dysfunction. 05/06/19   McGowan, Larene Beach A, PA-C  SUMAtriptan (IMITREX) 100 MG tablet TAKE 1 TABLET BY MOUTH EVERY 2 HOURS AS NEEDED, MAX OF 2 TABLETS IN 24 HOURS 08/03/17   Jinny Sanders, MD    Family History Family History  Problem Relation Age of Onset  . Hypertension Mother   . Coronary artery disease Father   . Hypertension Father   . Hyperlipidemia Father   . Diabetes Father   . Prostate cancer Neg Hx   . Kidney cancer Neg Hx   . Bladder Cancer Neg Hx     Social History Social History   Tobacco Use  . Smoking status: Never Smoker  . Smokeless tobacco: Never Used  Vaping Use  . Vaping Use: Former  Substance Use Topics  . Alcohol use: Yes    Comment: occ.  . Drug use: Not Currently     Allergies   Chocolate, Ibuprofen, and Shrimp [shellfish allergy]   Review of Systems Review of Systems   Physical Exam Triage Vital Signs ED Triage Vitals  Enc Vitals Group     BP 08/21/20 1122 119/76     Pulse Rate 08/21/20 1122 79     Resp 08/21/20 1122 16     Temp 08/21/20 1122 98.6 F (37 C)     Temp Source 08/21/20 1122 Oral     SpO2 08/21/20 1122 98 %     Weight 08/21/20 1120 200 lb (90.7 kg)     Height 08/21/20 1120 5\' 7"  (1.702 m)     Head Circumference --      Peak Flow --       Pain Score 08/21/20 1120 0     Pain Loc --      Pain Edu? --      Excl. in [place]? --    No data found.  Updated Vital Signs BP 119/76 (BP Location: Right Arm)   Pulse 79   Temp 98.6 F (37 C) (Oral)   Resp 16   Ht 5\' 7"  (1.702 m)   Wt 200 lb (90.7 kg)   SpO2 98%   BMI 31.32 kg/m   Visual Acuity Right Eye Distance:   Left Eye Distance:   Bilateral Distance:    Right Eye Near:  Left Eye Near:    Bilateral Near:     Physical Exam Constitutional:      Appearance: He is well-developed.  HENT:     Right Ear: Tympanic membrane normal.     Left Ear: Tympanic membrane normal.  Cardiovascular:     Rate and Rhythm: Normal rate.  Pulmonary:     Effort: Pulmonary effort is normal.     Breath sounds: Normal breath sounds.  Musculoskeletal:     Right lower leg: No edema.     Left lower leg: No edema.  Skin:    General: Skin is warm and dry.  Neurological:     Mental Status: He is alert and oriented to person, place, and time.      UC Treatments / Results  Labs (all labs ordered are listed, but only abnormal results are displayed) Labs Reviewed - No data to display  EKG   Radiology DG Chest 2 View  Result Date: 08/21/2020 CLINICAL DATA:  Cough and congestion. EXAM: CHEST - 2 VIEW COMPARISON:  10/24/2014 FINDINGS: The heart size and mediastinal contours are within normal limits. Both lungs are clear. The visualized skeletal structures are unremarkable. IMPRESSION: No active cardiopulmonary disease. Electronically Signed   By: Franchot Gallo M.D.   On: 08/21/2020 12:38    Procedures Procedures (including critical care time)  Medications Ordered in UC Medications - No data to display  Initial Impression / Assessment and Plan / UC Course  I have reviewed the triage vital signs and the nursing notes.  Pertinent labs & imaging results that were available during my care of the patient were reviewed by me and considered in my medical decision making (see chart for  details).     No work of breathing, oxygen and heart rate stable here today, cxr without acute findings. Persistent cough, now at 10days, worse at night often. Prednisone and azithromycin provided with precautions in regards to blood sugar provided. Return precautions provided. Patient verbalized understanding and agreeable to plan. Ambulatory out of clinic without difficulty.    Final Clinical Impressions(s) / UC Diagnoses   Final diagnoses:  Cough  Bronchitis     Discharge Instructions     Your chest xray looks well today which is reassuring.  I would like to try bronchitis treatment with steroids and azithromycin to see if this is helpful with your cough. You can stop other antibiotics you have been taking.  Monitor your blood sugar as prednisone will increase your blood sugar. Drink plenty of water.  If symptoms worsen or do not improve in the next week to return to be seen or to follow up with your PCP.      ED Prescriptions    Medication Sig Dispense Auth. Provider   azithromycin (ZITHROMAX) 250 MG tablet Take 2 tablets (500 mg total) by mouth daily for 1 day, THEN 1 tablet (250 mg total) daily for 4 days. 6 tablet Augusto Gamble B, NP   predniSONE (DELTASONE) 20 MG tablet Take 2 tablets (40 mg total) by mouth daily with breakfast for 5 days. 10 tablet Zigmund Gottron, NP     PDMP not reviewed this encounter.   Zigmund Gottron, NP 08/21/20 1257

## 2020-08-21 NOTE — Telephone Encounter (Signed)
Noted.. pt needs in person exam.. Urgent Care visit appropriate.

## 2020-08-21 NOTE — Telephone Encounter (Signed)
[facility] Night - Client Nonclinical Telephone Record AccessNurse Client [place] Night - Client Client Site [place] Physician Eliezer Lofts - MD Contact Type Call Who Is Calling Patient / Member / Family / Caregiver Caller Name [place] Phone Number [phone] Patient Name Dennis Hicks Patient DOB 12/13/1955 Call Type Message Only Information Provided Reason for Call Request to Schedule Office Appointment Initial Comment Caller states he has an upper respiratory infection. He has chest congestion and a cough. Patient request to speak to RN No Additional Comment Declined triage. Office hours provided. Disp. Time Disposition Final User 08/20/2020 5:29:13 PM General Information Provided Yes Uvaldo Rising Call Closed By: Uvaldo Rising Transaction Date/Time: 08/20/2020 5:22:49 PM (ET)

## 2020-08-21 NOTE — Discharge Instructions (Signed)
Your chest xray looks well today which is reassuring.  I would like to try bronchitis treatment with steroids and azithromycin to see if this is helpful with your cough. You can stop other antibiotics you have been taking.  Monitor your blood sugar as prednisone will increase your blood sugar. Drink plenty of water.  If symptoms worsen or do not improve in the next week to return to be seen or to follow up with your PCP.

## 2020-08-21 NOTE — Telephone Encounter (Signed)
I spoke with pt; starting over 1 wk ago; bad S/T, head and chest congestion;pt said his mom and sister are nurses; mucus is yellow and green and started Cipro 500 mg bid on 08/17/20 and last night out of cipro and took amoxicillin 500 mg starting last night when returned home. Pt did not see a provider pt got abx from his mom.Pt has been in [place] due to death of father. pt is taking Mucinex DM for 8 days. Pt hears rattling and wheezing in chest at night. Continuous low grade fever 99.4 - over 100; pt has tested neg for home covid test. Not in distress with breathing now. Oxygen levels have been between low to mid 90s. Pt is having a lot of chest congestion and pt has hx x 2 of pneumonia. Pt has taken tessalon perle from mom and not really helping. Pt does not want to go to UC and pt always feels worse at night and pt said he will go to [facility] in [place]. Sending note to Dr Diona Browner.

## 2020-08-21 NOTE — Telephone Encounter (Signed)
Per chart review tab pt went to [facility] in [place].

## 2020-08-21 NOTE — ED Triage Notes (Signed)
Patient c/o cough and chest congestion for the past 10 days.  Patient states that he took someone else Cipro antibiotic.  Patient states that his congestion is worse at night.  Patient has tired tessalon perles.  Patient denies fevers.

## 2020-10-19 ENCOUNTER — Other Ambulatory Visit: Payer: Self-pay

## 2020-10-19 ENCOUNTER — Ambulatory Visit: Payer: 59 | Admitting: Dermatology

## 2020-10-19 DIAGNOSIS — L814 Other melanin hyperpigmentation: Secondary | ICD-10-CM

## 2020-10-19 DIAGNOSIS — Z85820 Personal history of malignant melanoma of skin: Secondary | ICD-10-CM | POA: Diagnosis not present

## 2020-10-19 DIAGNOSIS — D229 Melanocytic nevi, unspecified: Secondary | ICD-10-CM

## 2020-10-19 DIAGNOSIS — D225 Melanocytic nevi of trunk: Secondary | ICD-10-CM

## 2020-10-19 DIAGNOSIS — L578 Other skin changes due to chronic exposure to nonionizing radiation: Secondary | ICD-10-CM | POA: Diagnosis not present

## 2020-10-19 DIAGNOSIS — Z1283 Encounter for screening for malignant neoplasm of skin: Secondary | ICD-10-CM

## 2020-10-19 DIAGNOSIS — L821 Other seborrheic keratosis: Secondary | ICD-10-CM

## 2020-10-19 DIAGNOSIS — L570 Actinic keratosis: Secondary | ICD-10-CM | POA: Diagnosis not present

## 2020-10-19 DIAGNOSIS — D1800 Hemangioma unspecified site: Secondary | ICD-10-CM

## 2020-10-19 NOTE — Patient Instructions (Signed)

## 2020-10-19 NOTE — Progress Notes (Signed)
Follow-Up Visit   Subjective  Dennis Hicks is a 65 y.o. male who presents for the following: Total body skin exam (Hx of Melanoma R lat chest 2013, hx of AKs) and check spot (R forearm, just noticed). Stays pink and scaly.  He has scaly spots on head.  The patient presents for Total-Body Skin Exam (TBSE) for skin cancer screening and mole check.   The following portions of the chart were reviewed this encounter and updated as appropriate:       Review of Systems:  No other skin or systemic complaints except as noted in HPI or Assessment and Plan.  Objective  Well appearing patient in no apparent distress; mood and affect are within normal limits.  A full examination was performed including scalp, head, eyes, ears, nose, lips, neck, chest, axillae, abdomen, back, buttocks, bilateral upper extremities, bilateral lower extremities, hands, feet, fingers, toes, fingernails, and toenails. All findings within normal limits unless otherwise noted below.  R lat chest Well healed scar with no evidence of recurrence  R med chest  R med chest 0.5 x 0.4cm med light brown macule  R nasal dorsum x 1, R crown x 4, L vertex x 2 (7) Pink scaly macules   R forearm x 1, R hand dorsum x 1, mid forehead x 1,Total = 3 (3) Erythematous keratotic or waxy stuck-on papule    Assessment & Plan   Lentigines - Scattered tan macules - Due to sun exposure - Benign-appering, observe - Recommend daily broad spectrum sunscreen SPF 30+ to sun-exposed areas, reapply every 2 hours as needed. - Call for any changes  Seborrheic Keratoses - Stuck-on, waxy, tan-brown papules and/or plaques  - Benign-appearing - Discussed benign etiology and prognosis. - Observe - Call for any changes  Melanocytic Nevi - Tan-brown and/or pink-flesh-colored symmetric macules and papules - Benign appearing on exam today - Observation - Call clinic for new or changing moles - Recommend daily use of broad spectrum  spf 30+ sunscreen to sun-exposed areas.   Hemangiomas - Red papules - Discussed benign nature - Observe - Call for any changes  Actinic Damage - Chronic condition, secondary to cumulative UV/sun exposure - diffuse scaly erythematous macules with underlying dyspigmentation - Recommend daily broad spectrum sunscreen SPF 30+ to sun-exposed areas, reapply every 2 hours as needed.  - Staying in the shade or wearing long sleeves, sun glasses (UVA+UVB protection) and wide brim hats (4-inch brim around the entire circumference of the hat) are also recommended for sun protection.  - Call for new or changing lesions.  Skin cancer screening performed today.  History of melanoma R lat chest  Clark's level III/early IV, Breslow's 0.75m Excised 02/20/2012  Clear. Observe for recurrence. Call clinic for new or changing lesions.  Recommend regular skin exams, daily broad-spectrum spf 30+ sunscreen use, and photoprotection.    Nevus R med chest  Benign-appearing.  Observation.  Call clinic for new or changing moles.  Recommend daily use of broad spectrum spf 30+ sunscreen to sun-exposed areas.    AK (actinic keratosis) (7) R nasal dorsum x 1, R crown x 4, L vertex x 2  Actinic keratoses are precancerous spots that appear secondary to cumulative UV radiation exposure/sun exposure over time. They are chronic with expected duration over 1 year. A portion of actinic keratoses will progress to squamous cell carcinoma of the skin. It is not possible to reliably predict which spots will progress to skin cancer and so treatment is recommended to prevent  development of skin cancer.  Recommend daily broad spectrum sunscreen SPF 30+ to sun-exposed areas, reapply every 2 hours as needed.  Recommend staying in the shade or wearing long sleeves, sun glasses (UVA+UVB protection) and wide brim hats (4-inch brim around the entire circumference of the hat). Call for new or changing lesions.   Destruction of  lesion - R nasal dorsum x 1, R crown x 4, L vertex x 2  Destruction method: cryotherapy   Informed consent: discussed and consent obtained   Lesion destroyed using liquid nitrogen: Yes   Region frozen until ice ball extended beyond lesion: Yes   Outcome: patient tolerated procedure well with no complications   Post-procedure details: wound care instructions given   Additional details:  Prior to procedure, discussed risks of blister formation, small wound, skin dyspigmentation, or rare scar following cryotherapy. Recommend Vaseline ointment to treated areas while healing.   Hypertrophic actinic keratosis (3) R forearm x 1, R hand dorsum x 1, mid forehead x 1,Total = 3  Vs ISKs  Destruction of lesion - R forearm x 1, R hand dorsum x 1, mid forehead x 1,Total = 3  Destruction method: cryotherapy   Informed consent: discussed and consent obtained   Lesion destroyed using liquid nitrogen: Yes   Region frozen until ice ball extended beyond lesion: Yes   Outcome: patient tolerated procedure well with no complications   Post-procedure details: wound care instructions given   Additional details:  Prior to procedure, discussed risks of blister formation, small wound, skin dyspigmentation, or rare scar following cryotherapy. Recommend Vaseline ointment to treated areas while healing.   Return in about 6 months (around 04/21/2021) for TBSE, Hx of Melanoma, Hx of AKs.  I, Othelia Pulling, RMA, am acting as scribe for Brendolyn Patty, MD .  Documentation: I have reviewed the above documentation for accuracy and completeness, and I agree with the above.  Brendolyn Patty MD

## 2020-11-06 ENCOUNTER — Ambulatory Visit: Payer: 59 | Admitting: Urology

## 2020-11-10 ENCOUNTER — Ambulatory Visit: Payer: 59 | Admitting: Dermatology

## 2020-11-17 ENCOUNTER — Other Ambulatory Visit: Payer: Self-pay

## 2020-11-17 ENCOUNTER — Other Ambulatory Visit
Admission: RE | Admit: 2020-11-17 | Discharge: 2020-11-17 | Disposition: A | Discharge status: Home / Self Care | Payer: 59 | Attending: Urology | Admitting: Urology

## 2020-11-17 DIAGNOSIS — C61 Malignant neoplasm of prostate: Secondary | ICD-10-CM | POA: Diagnosis present

## 2020-11-17 LAB — PSA: Prostatic Specific Antigen: <0.01 ng/mL (ref 0.00–4.00)

## 2020-11-20 ENCOUNTER — Other Ambulatory Visit: Payer: Self-pay

## 2020-11-20 ENCOUNTER — Ambulatory Visit: Payer: 59 | Admitting: Urology

## 2020-11-20 VITALS — BP 120/74 | HR 90 | Ht 67.0 in | Wt 185.0 lb

## 2020-11-20 DIAGNOSIS — N529 Male erectile dysfunction, unspecified: Secondary | ICD-10-CM

## 2020-11-20 DIAGNOSIS — C61 Malignant neoplasm of prostate: Secondary | ICD-10-CM

## 2020-11-20 DIAGNOSIS — N393 Stress incontinence (female) (male): Secondary | ICD-10-CM | POA: Diagnosis not present

## 2020-11-20 DIAGNOSIS — Z189 Retained foreign body fragments, unspecified material: Secondary | ICD-10-CM

## 2020-11-20 NOTE — Patient Instructions (Signed)
PSA only in 6 months. PSA and MD visit one year.

## 2020-11-20 NOTE — Progress Notes (Signed)
11/20/2020 12:16 PM   [place] May 16, 1955 [id number]  Referring provider: Jinny Sanders, MD [place],  [place] [id number]  Chief Complaint  Patient presents with   Prostate Cancer    HPI: 65 year old male with personal history of prostate cancer, erectile dysfunction and stress urinary continence who returns today for routine annual follow-up.  He has been seen and evaluated by Dr. Francesca Jewett at [facility] for which he has elected to undergo both artificial urinary sphincter as well as penile prosthesis placement.  As part of this evaluation, he underwent cystoscopy which revealed a small calcification at the bladder neck at the site of a surgical clip was noted.  He is scheduled for cystolitholapaxy and possible excision of the clip prior to undergoing implant.  He has been asymptomatic from this calcification without infection, bleeding, pelvic pain, or recurrent UTIs.  Review of intraoperative findings did indicate that he had a quite thickened bladder neck and additional margins were excised.  His disease ended up involving his bladder neck.  There is also a small buttonhole which was repaired.  He did have a negative cystogram prior to catheter removal.  Prostate cancer Surgical pathology consistent with high risk Gleason 4+5 prostate cancer with invasion of the bladder neck, extraprostatic extension, negative margins, negative lymph nodes, no seminal vesicle involvement.  pT3a N0 MX.  Adjuvant XRT completed 01/2019.  PSA remains undetectable as of 11/17/2020.    PMH: Past Medical History:  Diagnosis Date   Actinic keratosis    Borderline diabetes    Bronchitis    COVID-19 virus infection 04/16/2020   Diabetes mellitus without complication ([place])    Headache    Heartburn    HLD (hyperlipidemia)    Hypertension    Melanoma ([place]) 01/24/2012   R lat chest, Clark's level III/early IV, Breslow's 0.2m, excised 02/20/2012   Pneumonia    x2 Aug. 2017    Psoriasis    Renal insufficiency     Surgical History: Past Surgical History:  Procedure Laterality Date   FINGER GANGLION CYST EXCISION     PELVIC LYMPH NODE DISSECTION Bilateral 02/19/2018   Procedure: PELVIC LYMPH NODE DISSECTION;  Surgeon: BHollice Espy MD;  Location: [facility] ORS;  Service: Urology;  Laterality: Bilateral;   ROBOT ASSISTED LAPAROSCOPIC RADICAL PROSTATECTOMY N/A 02/19/2018   Procedure: ROBOTIC ASSISTED LAPAROSCOPIC RADICAL PROSTATECTOMY;  Surgeon: BHollice Espy MD;  Location: [facility] ORS;  Service: Urology;  Laterality: N/A;    Home Medications:  Allergies as of 11/20/2020       Reactions   Chocolate Other (See Comments)   Trigger Migraine   Ibuprofen Other (See Comments)   Kidney issues   Shrimp [shellfish Allergy] Other (See Comments)   Trigger Migraines        Medication List        Accurate as of November 20, 2020 11:59 PM. If you have any questions, ask your nurse or doctor.          benazepril-hydrochlorthiazide 20-25 MG tablet Commonly known as: LOTENSIN HCT TAKE 1 TABLET DAILY   betamethasone dipropionate 0.05 % cream Apply topically 2 (two) times daily.   Caverject Impulse 10 MCG injection Generic drug: alprostadil 10 mcg by Intracavitary route as needed for erectile dysfunction. use no more than 3 times per week   alprostadil 1000 MCG pellet Commonly known as: Muse 1 each (1,000 mcg total) by Transurethral route as needed for erectile dysfunction. use no more than 3 times per week  famotidine 10 MG chewable tablet Commonly known as: PEPCID AC Chew 10 mg by mouth daily as needed for heartburn.   HYDROcodone-acetaminophen 5-325 MG tablet Commonly known as: NORCO/VICODIN Take 1 tablet by mouth daily as needed for moderate pain.   metFORMIN 500 MG 24 hr tablet Commonly known as: GLUCOPHAGE-XR TAKE 2 TABLETS DAILY WITH BREAKFAST   NONFORMULARY OR COMPOUNDED ITEM Trimix (30/1/10)-(Pap/Phent/PGE)  Test Dose 34m vial  Qty #1  Refills 0  [phone] [phone]  sildenafil 100 MG tablet Commonly known as: VIAGRA Take 1 tablet (100 mg total) by mouth daily as needed for erectile dysfunction.   simvastatin 40 MG tablet Commonly known as: ZOCOR TAKE 1 TABLET DAILY (NEEDS OFFICE VISIT)   SUMAtriptan 100 MG tablet Commonly known as: IMITREX TAKE 1 TABLET BY MOUTH EVERY 2 HOURS AS NEEDED, MAX OF 2 TABLETS IN 24 HOURS        Allergies:  Allergies  Allergen Reactions   Chocolate Other (See Comments)    Trigger Migraine   Ibuprofen Other (See Comments)    Kidney issues   Shrimp [Shellfish Allergy] Other (See Comments)    Trigger Migraines    Family History: Family History  Problem Relation Age of Onset   Hypertension Mother    Coronary artery disease Father    Hypertension Father    Hyperlipidemia Father    Diabetes Father    Prostate cancer Neg Hx    Kidney cancer Neg Hx    Bladder Cancer Neg Hx     Social History:  reports that he has never smoked. He has never used smokeless tobacco. He reports current alcohol use. He reports that he does not currently use drugs.   Physical Exam: BP 120/74   Pulse 90   Ht '5\' 7"'$  (1.702 m)   Wt 185 lb (83.9 kg)   BMI 28.98 kg/m   Constitutional:  Alert and oriented, No acute distress. HEENT: [place] AT, moist mucus membranes.  Trachea midline, no masses. Cardiovascular: No clubbing, cyanosis, or edema. Respiratory: Normal respiratory effort, no increased work of breathing. Skin: No rashes, bruises or suspicious lesions. Neurologic: Grossly intact, no focal deficits, moving all 4 extremities. Psychiatric: Normal mood and affect.  Assessment & Plan:    1. Prostate cancer ([place] No evidence of disease status post prostatectomy for high risk disease with adjuvant and salvage radiation  Continue to monitor PSA every every 6 months - PSA; Future  2. Erectile dysfunction of organic origin [facility] notes  3. Stress incontinence of urine [facility]  4. Retained foreign body We discussed the intraoperative details and that this is fairly rare complication and somewhat difficult to manage  He is scheduled for cystolitholapaxy to remove the calculus and possibly that the clip although clip removal may or may not be successful.  Likely, he has been asymptomatic from this.  We discussed agree that removing the calcification and ideal clip prior to implant does make the most clinically appropriate since in order to avoid endoscopic manipulation following implant and in the future as well as reduce complications down the road.  He understands and is agreeable this plan.  He had some questions today about all 3 of his upcoming surgeries.  Ideally like to get all this done prior to the new year because of insurance reasons/deductible.   F/u 6 months for PSA only, MD visit with PSA in one year or  sooner as needed  Hollice Espy, MD  [facility] [address] [place], [place] [address] [phone]  I spent 35 total minutes on the day of the encounter including pre-visit review of the medical record, face-to-face time with the patient, and post visit ordering of labs/imaging/tests.

## 2020-12-10 ENCOUNTER — Telehealth: Payer: Self-pay | Admitting: Family Medicine

## 2020-12-10 DIAGNOSIS — E1122 Type 2 diabetes mellitus with diabetic chronic kidney disease: Secondary | ICD-10-CM

## 2020-12-10 DIAGNOSIS — N1831 Chronic kidney disease, stage 3a: Secondary | ICD-10-CM

## 2020-12-10 NOTE — Telephone Encounter (Signed)
-----   Message from Ellamae Sia sent at 12/10/2020  8:07 AM EDT ----- Regarding: Lab orders for Monday, 10.10.22 Lab orders for f/u

## 2020-12-27 DIAGNOSIS — G8929 Other chronic pain: Secondary | ICD-10-CM

## 2020-12-27 DIAGNOSIS — M545 Low back pain, unspecified: Secondary | ICD-10-CM

## 2020-12-28 ENCOUNTER — Other Ambulatory Visit: Payer: Self-pay

## 2020-12-28 ENCOUNTER — Other Ambulatory Visit (INDEPENDENT_AMBULATORY_CARE_PROVIDER_SITE_OTHER): Payer: 59

## 2020-12-28 ENCOUNTER — Other Ambulatory Visit: Payer: 59

## 2020-12-28 DIAGNOSIS — E1122 Type 2 diabetes mellitus with diabetic chronic kidney disease: Secondary | ICD-10-CM

## 2020-12-28 DIAGNOSIS — N1831 Chronic kidney disease, stage 3a: Secondary | ICD-10-CM

## 2020-12-28 DIAGNOSIS — C61 Malignant neoplasm of prostate: Secondary | ICD-10-CM

## 2020-12-28 LAB — COMPREHENSIVE METABOLIC PANEL
ALT: 26 U/L (ref 0–53)
AST: 23 U/L (ref 0–37)
Albumin: 4.5 g/dL (ref 3.5–5.2)
Alkaline Phosphatase: 53 U/L (ref 39–117)
BUN: 16 mg/dL (ref 6–23)
CO2: 27 mEq/L (ref 19–32)
Calcium: 9.4 mg/dL (ref 8.4–10.5)
Chloride: 101 mEq/L (ref 96–112)
Creatinine, Ser: 1.05 mg/dL (ref 0.40–1.50)
GFR: 75.34 mL/min (ref >60.00)
Glucose, Bld: 133 mg/dL — ABNORMAL HIGH (ref 70–99)
Potassium: 4.2 mEq/L (ref 3.5–5.1)
Sodium: 138 mEq/L (ref 135–145)
Total Bilirubin: 0.6 mg/dL (ref 0.2–1.2)
Total Protein: 6.9 g/dL (ref 6.0–8.3)

## 2020-12-28 LAB — LIPID PANEL
Cholesterol: 175 mg/dL (ref 0–200)
HDL: 37.3 mg/dL — ABNORMAL LOW (ref >39.00)
LDL Cholesterol: 105 mg/dL — ABNORMAL HIGH (ref 0–99)
NonHDL: 137.84
Total CHOL/HDL Ratio: 5
Triglycerides: 163 mg/dL — ABNORMAL HIGH (ref 0.0–149.0)
VLDL: 32.6 mg/dL (ref 0.0–40.0)

## 2020-12-28 LAB — HEMOGLOBIN A1C: Hgb A1c MFr Bld: 7 % — ABNORMAL HIGH (ref 4.6–6.5)

## 2020-12-28 NOTE — Progress Notes (Signed)
No critical labs need to be addressed urgently. We will discuss labs in detail at upcoming office visit.   

## 2020-12-28 NOTE — Telephone Encounter (Signed)
Name of Medication: Hydrocodone-acet Name of Pharmacy: [facility] or Written Date and Quantity: 04/03/20 #45 with 0 refill Last Office Visit and Type: 06/26/20 CPE Next Office Visit and Type: 12/31/20 f/u Last Controlled Substance Agreement Date: 10/24/19 Last UDS: 10/24/19

## 2020-12-31 ENCOUNTER — Encounter: Payer: Self-pay | Admitting: Family Medicine

## 2020-12-31 ENCOUNTER — Other Ambulatory Visit: Payer: Self-pay

## 2020-12-31 ENCOUNTER — Ambulatory Visit: Payer: 59 | Admitting: Family Medicine

## 2020-12-31 VITALS — BP 120/64 | HR 75 | Temp 98.6°F | Ht 66.25 in | Wt 193.1 lb

## 2020-12-31 DIAGNOSIS — E1159 Type 2 diabetes mellitus with other circulatory complications: Secondary | ICD-10-CM

## 2020-12-31 DIAGNOSIS — E1169 Type 2 diabetes mellitus with other specified complication: Secondary | ICD-10-CM | POA: Diagnosis not present

## 2020-12-31 DIAGNOSIS — E785 Hyperlipidemia, unspecified: Secondary | ICD-10-CM

## 2020-12-31 DIAGNOSIS — G8929 Other chronic pain: Secondary | ICD-10-CM

## 2020-12-31 DIAGNOSIS — M545 Low back pain, unspecified: Secondary | ICD-10-CM | POA: Diagnosis not present

## 2020-12-31 DIAGNOSIS — E1122 Type 2 diabetes mellitus with diabetic chronic kidney disease: Secondary | ICD-10-CM | POA: Diagnosis not present

## 2020-12-31 DIAGNOSIS — N1831 Chronic kidney disease, stage 3a: Secondary | ICD-10-CM

## 2020-12-31 DIAGNOSIS — I152 Hypertension secondary to endocrine disorders: Secondary | ICD-10-CM

## 2020-12-31 DIAGNOSIS — N183 Chronic kidney disease, stage 3 unspecified: Secondary | ICD-10-CM

## 2020-12-31 MED ORDER — HYDROCODONE-ACETAMINOPHEN 5-325 MG PO TABS: 1.0000 | ORAL_TABLET | Freq: Every day | ORAL | 0 refills | Status: DC | PRN

## 2020-12-31 MED ORDER — ATORVASTATIN CALCIUM 40 MG PO TABS: 40.0000 mg | ORAL_TABLET | Freq: Every day | ORAL | 3 refills | Status: DC

## 2020-12-31 NOTE — Assessment & Plan Note (Signed)
Stable, chronic.  Continue current medication.   Benazapril/HCTZ 20/25 mg daily

## 2020-12-31 NOTE — Assessment & Plan Note (Signed)
Chronic, stable control.   Discussed benefits of kidney or heart protective medication like  GLP vs SGLT2i.  father had ? SE issues with metformin.. mother believed it caused his kidney failure.. ? Lactic acidosis.  Pt's renal function improved today.. so we will continue metformin.  Consider change to lower dose metformin and SGLT2i vs GLP1 at next OV ( he is not keen on a weekly injection)

## 2020-12-31 NOTE — Assessment & Plan Note (Signed)
Due to DM and possibly chronic dehydration   Significant improvement with increased water intake.  Estimated Creatinine Clearance: 75.1 mL/min (by C-G formula based on SCr of 1.05 mg/dL).

## 2020-12-31 NOTE — Progress Notes (Signed)
Patient ID: Dennis Hicks, male    DOB: [birth year], 65 y.o.   MRN: [id number]  This visit was conducted in person.  BP 120/64   Pulse 75   Temp 98.6 F (37 C) (Temporal)   Ht 5' 6.25" (1.683 m)   Wt 193 lb 1 oz (87.6 kg)   SpO2 99%   BMI 30.93 kg/m    CC: Chief Complaint  Patient presents with   Diabetes    Here for 6 mo f/u.    Subjective:   HPI: Dennis Hicks is a 65 y.o. male presenting on 12/31/2020 for Diabetes (Here for 6 mo f/u.)   CKD: improved.Marland Kitchen GFR now 75. He has been increasing water  Family history of kidney issues.  Diabetes:  On metfomrin 2 tabs daily. Lab Results  Component Value Date   HGBA1C 7.0 (H) 12/28/2020  Using medications without difficulties: Hypoglycemic episodes: Hyperglycemic episodes: Feet problems: Blood Sugars averaging: eye exam within last year:  Wt Readings from Last 3 Encounters:  12/31/20 193 lb 1 oz (87.6 kg)  11/20/20 185 lb (83.9 kg)  08/21/20 200 lb (90.7 kg)     Hypertension:   At goal on ACEI/HCTZ BP Readings from Last 3 Encounters:  12/31/20 120/64  11/20/20 120/74  08/21/20 119/76  Using medication without problems or lightheadedness:  none Chest pain with exertion: none Edema:none Short of breath:none Average home BPs: Other issues:  Elevated Cholesterol:  LDL not at goal but better on simvastatin 40 mg daily. He is struggling to do low carb diet but still decrease animal fats. Lab Results  Component Value Date   CHOL 175 12/28/2020   HDL 37.30 (L) 12/28/2020   LDLCALC 105 (H) 12/28/2020   LDLDIRECT 158.2 01/03/2013   TRIG 163.0 (H) 12/28/2020   CHOLHDL 5 12/28/2020  Using medications without problems: Muscle aches:  Diet compliance: good Exercise: regular exercise 60  min 5-7 days a week Other complaints:    Indication for chronic opioid:chronic low back and  shoulder pain, had some left hip pain acutely.. better now Medication and dose: hydrocodone # pills per month:  45 in 6-9  months Last UDS date: 10/24/2019 Opioid Treatment Agreement signed (Y/N): Y Opioid Treatment Agreement last reviewed with patient:   Y NCCSRS reviewed this encounter (include red flags):   Yes, no red flags.    Relevant past medical, surgical, family and social history reviewed and updated as indicated. Interim medical history since our last visit reviewed. Allergies and medications reviewed and updated. Outpatient Medications Prior to Visit  Medication Sig Dispense Refill   benazepril-hydrochlorthiazide (LOTENSIN HCT) 20-25 MG tablet TAKE 1 TABLET DAILY 90 tablet 3   betamethasone dipropionate 0.05 % cream Apply topically 2 (two) times daily. 30 g 0   Famotidine (PEPCID PO) Take by mouth as needed.     HYDROcodone-acetaminophen (NORCO/VICODIN) 5-325 MG tablet Take 1 tablet by mouth daily as needed for moderate pain. 45 tablet 0   metFORMIN (GLUCOPHAGE-XR) 500 MG 24 hr tablet TAKE 2 TABLETS DAILY WITH BREAKFAST 180 tablet 3   simvastatin (ZOCOR) 40 MG tablet TAKE 1 TABLET DAILY (NEEDS OFFICE VISIT) 90 tablet 3   SUMAtriptan (IMITREX) 100 MG tablet TAKE 1 TABLET BY MOUTH EVERY 2 HOURS AS NEEDED, MAX OF 2 TABLETS IN 24 HOURS 10 tablet 0   alprostadil (CAVERJECT IMPULSE) 10 MCG injection 10 mcg by Intracavitary route as needed for erectile dysfunction. use no more than 3 times per week 1 each 12  alprostadil (MUSE) 1000 MCG pellet 1 each (1,000 mcg total) by Transurethral route as needed for erectile dysfunction. use no more than 3 times per week 6 each 3   famotidine (PEPCID AC) 10 MG chewable tablet Chew 10 mg by mouth daily as needed for heartburn.      NONFORMULARY OR COMPOUNDED ITEM Trimix (30/1/10)-(Pap/Phent/PGE)  Test Dose 59ml vial  Qty #1 Refills 0  [facility] [phone] Fax [phone] 1 each 0   sildenafil (VIAGRA) 100 MG tablet Take 1 tablet (100 mg total) by mouth daily as needed for erectile dysfunction. 30 tablet 3   No facility-administered medications prior  to visit.     Per HPI unless specifically indicated in ROS section below Review of Systems Objective:  BP 120/64   Pulse 75   Temp 98.6 F (37 C) (Temporal)   Ht 5' 6.25" (1.683 m)   Wt 193 lb 1 oz (87.6 kg)   SpO2 99%   BMI 30.93 kg/m   Wt Readings from Last 3 Encounters:  12/31/20 193 lb 1 oz (87.6 kg)  11/20/20 185 lb (83.9 kg)  08/21/20 200 lb (90.7 kg)      Physical Exam    Results for orders placed or performed in visit on 12/28/20  Comprehensive metabolic panel  Result Value Ref Range   Sodium 138 135 - 145 mEq/L   Potassium 4.2 3.5 - 5.1 mEq/L   Chloride 101 96 - 112 mEq/L   CO2 27 19 - 32 mEq/L   Glucose, Bld 133 (H) 70 - 99 mg/dL   BUN 16 6 - 23 mg/dL   Creatinine, Ser 1.05 0.40 - 1.50 mg/dL   Total Bilirubin 0.6 0.2 - 1.2 mg/dL   Alkaline Phosphatase 53 39 - 117 U/L   AST 23 0 - 37 U/L   ALT 26 0 - 53 U/L   Total Protein 6.9 6.0 - 8.3 g/dL   Albumin 4.5 3.5 - 5.2 g/dL   GFR 75.34 >60.00 mL/min   Calcium 9.4 8.4 - 10.5 mg/dL  Lipid panel  Result Value Ref Range   Cholesterol 175 0 - 200 mg/dL   Triglycerides 163.0 (H) 0.0 - 149.0 mg/dL   HDL 37.30 (L) >39.00 mg/dL   VLDL 32.6 0.0 - 40.0 mg/dL   LDL Cholesterol 105 (H) 0 - 99 mg/dL   Total CHOL/HDL Ratio 5    NonHDL 137.84   Hemoglobin A1c  Result Value Ref Range   Hgb A1c MFr Bld 7.0 (H) 4.6 - 6.5 %    This visit occurred during the SARS-CoV-2 public health emergency.  Safety protocols were in place, including screening questions prior to the visit, additional usage of staff PPE, and extensive cleaning of exam room while observing appropriate contact time as indicated for disinfecting solutions.   COVID 19 screen:  No recent travel or known exposure to COVID19 The patient denies respiratory symptoms of COVID 19 at this time. The importance of social distancing was discussed today.   Assessment and Plan Problem List Items Addressed This Visit     Chronic low back pain (Chronic)   Relevant  Orders   DRUG MONITORING, PANEL 8 WITH CONFIRMATION, URINE   Hyperlipidemia associated with type 2 diabetes mellitus (HCC) (Chronic)    Chronic, not at goal   Cannot increase simvastatin.. change to atorvastatin 40 mg daily... may need to increase further. Re-eval in 3 months. Encouraged exercise, weight loss, healthy eating habits.       Relevant Medications  atorvastatin (LIPITOR) 40 MG tablet   Hypertension associated with diabetes (HCC) (Chronic)    Stable, chronic.  Continue current medication.   Benazapril/HCTZ 20/25 mg daily      Relevant Medications   atorvastatin (LIPITOR) 40 MG tablet   Type 2 diabetes mellitus with renal complication (HCC) - Primary (Chronic)     Chronic, stable control.   Discussed benefits of kidney or heart protective medication like  GLP vs SGLT2i.  father had ? SE issues with metformin.. mother believed it caused his kidney failure.. ? Lactic acidosis.  Pt's renal function improved today.. so we will continue metformin.  Consider change to lower dose metformin and SGLT2i vs GLP1 at next OV ( he is not keen on a weekly injection)        Relevant Medications   atorvastatin (LIPITOR) 40 MG tablet   CKD stage 1 due to type 2 diabetes mellitus ([place])     Due to DM and possibly chronic dehydration   Significant improvement with increased water intake.  Estimated Creatinine Clearance: 75.1 mL/min (by C-G formula based on SCr of 1.05 mg/dL).       Relevant Medications   atorvastatin (LIPITOR) 40 MG tablet   Meds ordered this encounter  Medications   atorvastatin (LIPITOR) 40 MG tablet    Sig: Take 1 tablet (40 mg total) by mouth daily.    Dispense:  90 tablet    Refill:  3   Orders Placed This Encounter  Procedures   DRUG MONITORING, PANEL 8 WITH CONFIRMATION, URINE    Hydrocodone-acetaminophen       Eliezer Lofts, MD

## 2020-12-31 NOTE — Assessment & Plan Note (Signed)
Chronic, not at goal   Cannot increase simvastatin.. change to atorvastatin 40 mg daily... may need to increase further. Re-eval in 3 months. Encouraged exercise, weight loss, healthy eating habits.

## 2020-12-31 NOTE — Patient Instructions (Addendum)
Stop simvastatin.  Start atorvastatin 40 mg daily.  Keep up great work on low carb diet ( not aggressively low), regular exercise and weight loss. Avoid animal fats as well. Set up yearly eye exam for diabetes and have the opthalmologist send [place] a copy of the evaluation for the chart.

## 2021-01-02 LAB — DRUG MONITORING, PANEL 8 WITH CONFIRMATION, URINE
6 Acetylmorphine: NEGATIVE ng/mL (ref <10)
Alcohol Metabolites: NEGATIVE ng/mL (ref <500)
Amphetamines: NEGATIVE ng/mL (ref <500)
Benzodiazepines: NEGATIVE ng/mL (ref <100)
Buprenorphine, Urine: NEGATIVE ng/mL (ref <5)
Cocaine Metabolite: NEGATIVE ng/mL (ref <150)
Creatinine: 42.5 mg/dL (ref >=20.0)
MDMA: NEGATIVE ng/mL (ref <500)
Marijuana Metabolite: NEGATIVE ng/mL (ref <20)
Opiates: NEGATIVE ng/mL (ref <100)
Oxidant: NEGATIVE ug/mL (ref <200)
Oxycodone: NEGATIVE ng/mL (ref <100)
pH: 7.5 (ref 4.5–9.0)

## 2021-01-02 LAB — DM TEMPLATE

## 2021-02-14 ENCOUNTER — Encounter: Payer: Self-pay | Admitting: Family Medicine

## 2021-02-15 ENCOUNTER — Encounter: Payer: Self-pay | Admitting: Family Medicine

## 2021-02-15 ENCOUNTER — Telehealth (INDEPENDENT_AMBULATORY_CARE_PROVIDER_SITE_OTHER): Payer: 59 | Admitting: Family Medicine

## 2021-02-15 ENCOUNTER — Other Ambulatory Visit: Payer: Self-pay

## 2021-02-15 VITALS — Ht 66.25 in

## 2021-02-15 DIAGNOSIS — U071 COVID-19: Secondary | ICD-10-CM | POA: Diagnosis not present

## 2021-02-15 MED ORDER — MOLNUPIRAVIR EUA 200MG CAPSULE: 4.0000 | ORAL_CAPSULE | Freq: Two times a day (BID) | ORAL | 0 refills | Status: AC

## 2021-02-15 NOTE — Progress Notes (Signed)
      Dennis Hitch T. Kanetra Ho, MD Primary Care and Sports Medicine [facility] at [facility] [place], [address] Phone: [phone]  FAX: [place] - 65 y.o. male  MRN [id number]  Date of Birth: 11-07-1955  Visit Date: 02/15/2021  PCP: Jinny Sanders, MD  Referred by: Jinny Sanders, MD  Virtual Visit via Video Note:  I connected with  Dennis Hicks on 02/15/2021  9:40 AM EST by a video enabled telemedicine application and verified that I am speaking with the correct person using two identifiers.   Location patient: home computer, tablet, or smartphone Location provider: work or home office Consent: Verbal consent directly obtained from [facility]. Persons participating in the virtual visit: patient, provider  I discussed the limitations of evaluation and management by telemedicine and the availability of in person appointments. The patient expressed understanding and agreed to proceed.  Chief Complaint  Patient presents with   Covid Positive    [phone] Positive home test 02/14/2021-Symptoms started Saturday night   Fever   Nasal Congestion   Cough    History of Present Illness:  WIfe got sick on Thursday.  She was a Education officer, museum.  Sat evening, wife lost her sense of taste - +.  Late Sat evening, runny nose.  100.6 temp Congestion, cough.  APAP. Mucinex.  + h/o cancer, dm, htn, lipids. Body mass index is 30.93 kg/m.   Immunization History  Administered Date(s) Administered   Influenza Inj Mdck Quad Pf 04/19/2018   Influenza,inj,Quad PF,6+ Mos 03/10/2015, 12/04/2015, 12/19/2019   PFIZER(Purple Top)SARS-COV-2 Vaccination 06/09/2019, 06/30/2019, 01/01/2020   Pneumococcal Polysaccharide-23 12/04/2015   Tdap 12/04/2015     Review of Systems as above: See pertinent positives and pertinent negatives per HPI No acute distress verbally   Observations/Objective/Exam:  An attempt was made to  discern vital signs over the phone and per patient if applicable and possible.   General:    Alert, Oriented, appears well and in no acute distress  Pulmonary:     On inspection no signs of respiratory distress.  Psych / Neurological:     Pleasant and cooperative.  Assessment and Plan:    ICD-10-CM   1. COVID-19  U07.1      With multiple risk factors, rx with antivirals and continue supportive care.   I discussed the assessment and treatment plan with the patient. The patient was provided an opportunity to ask questions and all were answered. The patient agreed with the plan and demonstrated an understanding of the instructions.   The patient was advised to call back or seek an in-person evaluation if the symptoms worsen or if the condition fails to improve as anticipated.  Follow-up: prn unless noted otherwise below No follow-ups on file.  Meds ordered this encounter  Medications   molnupiravir EUA (LAGEVRIO) 200 mg CAPS capsule    Sig: Take 4 capsules (800 mg total) by mouth 2 (two) times daily for 5 days.    Dispense:  40 capsule    Refill:  0    No orders of the defined types were placed in this encounter.   Signed,  Maud Deed. Fergus Throne, MD

## 2021-03-03 ENCOUNTER — Encounter: Payer: Self-pay | Admitting: Urology

## 2021-04-19 ENCOUNTER — Other Ambulatory Visit: Payer: Self-pay | Admitting: Family Medicine

## 2021-05-04 ENCOUNTER — Other Ambulatory Visit: Payer: Self-pay

## 2021-05-04 ENCOUNTER — Ambulatory Visit: Payer: 59 | Admitting: Dermatology

## 2021-05-04 DIAGNOSIS — Z85820 Personal history of malignant melanoma of skin: Secondary | ICD-10-CM

## 2021-05-04 DIAGNOSIS — L570 Actinic keratosis: Secondary | ICD-10-CM | POA: Diagnosis not present

## 2021-05-04 DIAGNOSIS — L814 Other melanin hyperpigmentation: Secondary | ICD-10-CM

## 2021-05-04 DIAGNOSIS — D2261 Melanocytic nevi of right upper limb, including shoulder: Secondary | ICD-10-CM | POA: Diagnosis not present

## 2021-05-04 DIAGNOSIS — D1800 Hemangioma unspecified site: Secondary | ICD-10-CM

## 2021-05-04 DIAGNOSIS — L578 Other skin changes due to chronic exposure to nonionizing radiation: Secondary | ICD-10-CM | POA: Diagnosis not present

## 2021-05-04 DIAGNOSIS — D225 Melanocytic nevi of trunk: Secondary | ICD-10-CM | POA: Diagnosis not present

## 2021-05-04 DIAGNOSIS — Z1283 Encounter for screening for malignant neoplasm of skin: Secondary | ICD-10-CM | POA: Diagnosis not present

## 2021-05-04 DIAGNOSIS — L821 Other seborrheic keratosis: Secondary | ICD-10-CM

## 2021-05-04 DIAGNOSIS — D229 Melanocytic nevi, unspecified: Secondary | ICD-10-CM

## 2021-05-04 NOTE — Progress Notes (Signed)
Follow-Up Visit   Subjective  Dennis Hicks is a 66 y.o. male who presents for the following: Follow-up (Patient here today for 6 month tbse. Patient history of melanoma and aks. ).  The patient presents for Total-Body Skin Exam (TBSE) for skin cancer screening and mole check.  The patient has spots, moles and lesions to be evaluated, some may be new or changing and the patient has concerns that these could be cancer.   The following portions of the chart were reviewed this encounter and updated as appropriate:      Review of Systems: No other skin or systemic complaints except as noted in HPI or Assessment and Plan.   Objective  Well appearing patient in no apparent distress; mood and affect are within normal limits.  A full examination was performed including scalp, head, eyes, ears, nose, lips, neck, chest, axillae, abdomen, back, buttocks, bilateral upper extremities, bilateral lower extremities, hands, feet, fingers, toes, fingernails, and toenails. All findings within normal limits unless otherwise noted below.  right medial chest 0.5 x 0.4 cm med light brown macule   right shoulder 7 mm tan speckled flesh papule   right temple x 1, left nasal dorsum x 1 (2) Erythematous thin papules/macules with gritty scale.    Assessment & Plan  Nevus (2) right shoulder; right medial chest  Benign-appearing.  Stable. Observation.  Call clinic for new or changing lesions.  Recommend daily use of broad spectrum spf 30+ sunscreen to sun-exposed areas.    Actinic keratosis (2) right temple x 1, left nasal dorsum x 1  Actinic keratoses are precancerous spots that appear secondary to cumulative UV radiation exposure/sun exposure over time. They are chronic with expected duration over 1 year. A portion of actinic keratoses will progress to squamous cell carcinoma of the skin. It is not possible to reliably predict which spots will progress to skin cancer and so treatment is  recommended to prevent development of skin cancer.  Recommend daily broad spectrum sunscreen SPF 30+ to sun-exposed areas, reapply every 2 hours as needed.  Recommend staying in the shade or wearing long sleeves, sun glasses (UVA+UVB protection) and wide brim hats (4-inch brim around the entire circumference of the hat). Call for new or changing lesions.  Destruction of lesion - right temple x 1, left nasal dorsum x 1  Destruction method: cryotherapy   Informed consent: discussed and consent obtained   Lesion destroyed using liquid nitrogen: Yes   Region frozen until ice ball extended beyond lesion: Yes   Outcome: patient tolerated procedure well with no complications   Post-procedure details: wound care instructions given   Additional details:  Prior to procedure, discussed risks of blister formation, small wound, skin dyspigmentation, or rare scar following cryotherapy. Recommend Vaseline ointment to treated areas while healing.   Lentigines - Scattered tan macules - Due to sun exposure - Benign-appearing, observe - Recommend daily broad spectrum sunscreen SPF 30+ to sun-exposed areas, reapply every 2 hours as needed. - Call for any changes  Seborrheic Keratoses - Stuck-on, waxy, tan-brown papules and/or plaques at scalp , back  - Benign-appearing - Discussed benign etiology and prognosis. - Observe - Call for any changes  Melanocytic Nevi - Tan-brown and/or pink-flesh-colored symmetric macules and papules - Benign appearing on exam today - Observation - Call clinic for new or changing moles - Recommend daily use of broad spectrum spf 30+ sunscreen to sun-exposed areas.   Hemangiomas - Red papules - Discussed benign nature - Observe -  Call for any changes  Actinic Damage - Chronic condition, secondary to cumulative UV/sun exposure - diffuse scaly erythematous macules with underlying dyspigmentation - Recommend daily broad spectrum sunscreen SPF 30+ to sun-exposed  areas, reapply every 2 hours as needed.  - Staying in the shade or wearing long sleeves, sun glasses (UVA+UVB protection) and wide brim hats (4-inch brim around the entire circumference of the hat) are also recommended for sun protection.  - Call for new or changing lesions.  History of Melanoma - No evidence of recurrence today right lateral chest Clark's level III early IV, Breslow's 0.6 mm  Excised 02/20/2012 - Recommend regular full body skin exams - Recommend daily broad spectrum sunscreen SPF 30+ to sun-exposed areas, reapply every 2 hours as needed.  - Call if any new or changing lesions are noted between office visits  Skin cancer screening performed today. Return for 7 month tbse h/o melanoma. I, Ruthell Rummage, CMA, am acting as scribe for Brendolyn Patty, MD.  Documentation: I have reviewed the above documentation for accuracy and completeness, and I agree with the above.  Brendolyn Patty MD

## 2021-05-04 NOTE — Patient Instructions (Addendum)
Actinic keratoses are precancerous spots that appear secondary to cumulative UV radiation exposure/sun exposure over time. They are chronic with expected duration over 1 year. A portion of actinic keratoses will progress to squamous cell carcinoma of the skin. It is not possible to reliably predict which spots will progress to skin cancer and so treatment is recommended to prevent development of skin cancer.  Recommend daily broad spectrum sunscreen SPF 30+ to sun-exposed areas, reapply every 2 hours as needed.  Recommend staying in the shade or wearing long sleeves, sun glasses (UVA+UVB protection) and wide brim hats (4-inch brim around the entire circumference of the hat). Call for new or changing lesions.   Cryotherapy Aftercare  Wash gently with soap and water everyday.   Apply Vaseline and Band-Aid daily until healed.    Seborrheic Keratosis  What causes seborrheic keratoses? Seborrheic keratoses are harmless, common skin growths that first appear during adult life.  As time goes by, more growths appear.  Some people may develop a large number of them.  Seborrheic keratoses appear on both covered and uncovered body parts.  They are not caused by sunlight.  The tendency to develop seborrheic keratoses can be inherited.  They vary in color from skin-colored to gray, brown, or even black.  They can be either smooth or have a rough, warty surface.   Seborrheic keratoses are superficial and look as if they were stuck on the skin.  Under the microscope this type of keratosis looks like layers upon layers of skin.  That is why at times the top layer may seem to fall off, but the rest of the growth remains and re-grows.    Treatment Seborrheic keratoses do not need to be treated, but can easily be removed in the office.  Seborrheic keratoses often cause symptoms when they rub on clothing or jewelry.  Lesions can be in the way of shaving.  If they become inflamed, they can cause itching, soreness, or  burning.  Removal of a seborrheic keratosis can be accomplished by freezing, burning, or surgery. If any spot bleeds, scabs, or grows rapidly, please return to have it checked, as these can be an indication of a skin cancer.     Melanoma ABCDEs  Melanoma is the most dangerous type of skin cancer, and is the leading cause of death from skin disease.  You are more likely to develop melanoma if you: Have light-colored skin, light-colored eyes, or red or blond hair Spend a lot of time in the sun Tan regularly, either outdoors or in a tanning bed Have had blistering sunburns, especially during childhood Have a close family member who has had a melanoma Have atypical moles or large birthmarks  Early detection of melanoma is key since treatment is typically straightforward and cure rates are extremely high if we catch it early.   The first sign of melanoma is often a change in a mole or a new dark spot.  The ABCDE system is a way of remembering the signs of melanoma.  A for asymmetry:  The two halves do not match. B for border:  The edges of the growth are irregular. C for color:  A mixture of colors are present instead of an even brown color. D for diameter:  Melanomas are usually (but not always) greater than 72mm - the size of a pencil eraser. E for evolution:  The spot keeps changing in size, shape, and color.  Please check your skin once per month between visits. You can  use a small mirror in front and a large mirror behind you to keep an eye on the back side or your body.   If you see any new or changing lesions before your next follow-up, please call to schedule a visit.  Please continue daily skin protection including broad spectrum sunscreen SPF 30+ to sun-exposed areas, reapplying every 2 hours as needed when you're outdoors.   Staying in the shade or wearing long sleeves, sun glasses (UVA+UVB protection) and wide brim hats (4-inch brim around the entire circumference of the hat)  are also recommended for sun protection.    If You Need Anything After Your Visit  If you have any questions or concerns for your doctor, please call our main line at [phone] and press option 4 to reach your doctor's medical assistant. If no one answers, please leave a voicemail as directed and we will return your call as soon as possible. Messages left after 4 pm will be answered the following business day.   You may also send [place] a message via [place]. We typically respond to MyChart messages within 1-2 business days.  For prescription refills, please ask your pharmacy to contact our office. Our fax number is [phone].  If you have an urgent issue when the clinic is closed that cannot wait until the next business day, you can page your doctor at the number below.    Please note that while we do our best to be available for urgent issues outside of office hours, we are not available 24/7.   If you have an urgent issue and are unable to reach [place], you may choose to seek medical care at your doctor's office, retail clinic, urgent care center, or emergency room.  If you have a medical emergency, please immediately call 911 or go to the emergency department.  Pager Numbers  - Dr. Nehemiah Massed: [phone]  - Dr. Laurence Ferrari: [phone]  - Dr. Nicole Kindred: [phone]  In the event of inclement weather, please call our main line at [phone] for an update on the status of any delays or closures.  Dermatology Medication Tips: Please keep the boxes that topical medications come in in order to help keep track of the instructions about where and how to use these. Pharmacies typically print the medication instructions only on the boxes and not directly on the medication tubes.   If your medication is too expensive, please contact our office at [phone] option 4 or send [place] a message through [place].   We are unable to tell what your co-pay for medications will be in advance as this is  different depending on your insurance coverage. However, we may be able to find a substitute medication at lower cost or fill out paperwork to get insurance to cover a needed medication.   If a prior authorization is required to get your medication covered by your insurance company, please allow [place] 1-2 business days to complete this process.  Drug prices often vary depending on where the prescription is filled and some pharmacies may offer cheaper prices.  The website www.goodrx.com contains coupons for medications through different pharmacies. The prices here do not account for what the cost may be with help from insurance (it may be cheaper with your insurance), but the website can give you the price if you did not use any insurance.  - You can print the associated coupon and take it with your prescription to the pharmacy.  - You may also stop by our office during regular  business hours and pick up a GoodRx coupon card.  - If you need your prescription sent electronically to a different pharmacy, notify our office through [facility] or by phone at [phone] option 4.     Si Usted Necesita Algo Despus de Su Visita  Tambin puede enviarnos un mensaje a travs de Pharmacist, community. Por lo general respondemos a los mensajes de MyChart en el transcurso de 1 a 2 das hbiles.  Para renovar recetas, por favor pida a su farmacia que se ponga en contacto con nuestra oficina. Harland Dingwall de fax es [place] [phone].  Si tiene un asunto urgente cuando la clnica est cerrada y que no puede esperar hasta el siguiente da hbil, puede llamar/localizar a su doctor(a) al nmero que aparece a continuacin.   Por favor, tenga en cuenta que aunque hacemos todo lo posible para estar disponibles para asuntos urgentes fuera del horario de [place], no estamos disponibles las 24 horas del da, los 7 das de la [place].   Si tiene un problema urgente y no puede comunicarse con nosotros, puede optar por buscar  atencin mdica  en el consultorio de su doctor(a), en una clnica privada, en un centro de atencin urgente o en una sala de emergencias.  Si tiene Engineering geologist, por favor llame inmediatamente al 911 o vaya a la sala de emergencias.  Nmeros de bper  - Dr. Nehemiah Massed: [phone]  - Dra. Moye: [phone]  - Dra. Nicole Kindred: [phone]  En caso de inclemencias del [place], por favor llame a Johnsie Kindred principal al [phone] para una actualizacin sobre el [place] de cualquier retraso o cierre.  Consejos para la medicacin en dermatologa: Por favor, guarde las cajas en las que vienen los medicamentos de uso tpico para ayudarle a seguir las instrucciones sobre dnde y cmo usarlos. Las farmacias generalmente imprimen las instrucciones del medicamento slo en las cajas y no directamente en los tubos del [place].   Si su medicamento es muy caro, por favor, pngase en contacto con Zigmund Daniel llamando al [phone] y presione la opcin 4 o envenos un mensaje a travs de Pharmacist, community.   No podemos decirle cul ser su copago por los medicamentos por adelantado ya que esto es diferente dependiendo de la cobertura de su seguro. Sin embargo, es posible que podamos encontrar un medicamento sustituto a Electrical engineer un formulario para que el seguro cubra el medicamento que se considera necesario.   Si se requiere una autorizacin previa para que su compaa de seguros [place] su medicamento, por favor permtanos de 1 a 2 das hbiles para completar este proceso.  Los precios de los medicamentos varan con frecuencia dependiendo del Environmental consultant de dnde se surte la receta y alguna farmacias pueden ofrecer precios ms baratos.  El sitio web www.goodrx.com tiene cupones para medicamentos de Airline pilot. Los precios aqu no tienen en cuenta lo que podra costar con la ayuda del seguro (puede ser ms barato con su seguro), pero el sitio web puede darle el precio si no utiliz  Research scientist (physical sciences).  - Puede imprimir el cupn correspondiente y llevarlo con su receta a la farmacia.  - Tambin puede pasar por nuestra oficina durante el horario de atencin regular y Charity fundraiser una tarjeta de cupones de GoodRx.  - Si necesita que su receta se enve electrnicamente a una farmacia diferente, informe a nuestra oficina a travs de MyChart de [place] o por telfono llamando al [phone] y presione la opcin 4.

## 2021-05-10 ENCOUNTER — Ambulatory Visit: Payer: 59 | Admitting: Dermatology

## 2021-05-10 ENCOUNTER — Encounter: Payer: Self-pay | Admitting: Family Medicine

## 2021-05-11 ENCOUNTER — Other Ambulatory Visit: Payer: Self-pay

## 2021-05-11 ENCOUNTER — Telehealth (INDEPENDENT_AMBULATORY_CARE_PROVIDER_SITE_OTHER): Payer: 59 | Admitting: Family Medicine

## 2021-05-11 DIAGNOSIS — R051 Acute cough: Secondary | ICD-10-CM | POA: Diagnosis not present

## 2021-05-11 MED ORDER — GUAIFENESIN-CODEINE 100-10 MG/5ML PO SYRP: 5.0000 mL | ORAL_SOLUTION | Freq: Every evening | ORAL | 0 refills | Status: DC | PRN

## 2021-05-11 MED ORDER — AZITHROMYCIN 250 MG PO TABS: ORAL_TABLET | ORAL | 0 refills | Status: DC

## 2021-05-11 NOTE — Progress Notes (Signed)
VIRTUAL VISIT Due to national recommendations of social distancing due to [place] 19, a virtual visit is felt to be most appropriate for this patient at this time.   I connected with the patient on 05/11/21 at  4:20 PM EST by virtual telehealth platform and verified that I am speaking with the correct person using two identifiers.   I discussed the limitations, risks, security and privacy concerns of performing an evaluation and management service by  virtual telehealth platform and the availability of in person appointments. I also discussed with the patient that there may be a patient responsible charge related to this service. The patient expressed understanding and agreed to proceed.  Patient location: Home Provider Location: [facility] Participants: Eliezer Lofts and Charlynne Pander   Chief Complaint  Patient presents with   URI    Started about 2 weeks ago with sore throat, nasal congestion. Then dry cough. Was using NettiePot and Mucinex. Started coughing stuff up. Fever on and off.    History of Present Illness: 66 year old male patient with HTN, DM who presents with new onset cough.  Date of onset: 2 weeks ago  Started with ST and nasal congestion.. progressed with cough, dry... moved into productive cough later in illness... green brownish discharge.  Bloody discharge in sinus congestion.   Later in illness he has had fever.Marland Kitchen temp 99-100F sweating   No SOB,  occ rattling feeling with deep breaths, no wheeze   He has been using tylenol, cough drops, Nettipot and Mucinex.  COVID 19 screen COVID testing: COVID  2/8.or 2/9 COVID vaccine: 01/01/2020 , 06/30/2019 , 06/09/2019 COVID exposure: No recent travel or known exposure to [place].. wife is sick as well.  The importance of social distancing was discussed today.    Review of Systems  Constitutional:  Positive for fever. Negative for chills.  HENT:  Positive for congestion. Negative for ear pain.   Eyes:   Negative for pain and redness.  Respiratory:  Positive for cough. Negative for shortness of breath.   Cardiovascular:  Negative for chest pain, palpitations and leg swelling.  Gastrointestinal:  Negative for abdominal pain, blood in stool, constipation, diarrhea, nausea and vomiting.  Genitourinary:  Negative for dysuria.  Musculoskeletal:  Negative for falls and myalgias.  Skin:  Negative for rash.  Neurological:  Negative for dizziness.  Psychiatric/Behavioral:  Negative for depression. The patient is not nervous/anxious.      Past Medical History:  Diagnosis Date   Actinic keratosis    Borderline diabetes    Bronchitis    COVID-19 virus infection 04/16/2020   Diabetes mellitus without complication (HCC)    Headache    Heartburn    HLD (hyperlipidemia)    Hypertension    Melanoma ([place]) 01/24/2012   R lat chest, Clark's level III/early IV, Breslow's 0.30mm, excised 02/20/2012   Pneumonia    x2 Aug. 2017   Psoriasis    Renal insufficiency     reports that he has never smoked. He has never used smokeless tobacco. He reports current alcohol use. He reports that he does not currently use drugs.   Current Outpatient Medications:    atorvastatin (LIPITOR) 40 MG tablet, Take 1 tablet (40 mg total) by mouth daily., Disp: 90 tablet, Rfl: 3   benazepril-hydrochlorthiazide (LOTENSIN HCT) 20-25 MG tablet, TAKE 1 TABLET DAILY, Disp: 90 tablet, Rfl: 1   betamethasone dipropionate 0.05 % cream, Apply topically 2 (two) times daily., Disp: 30 g, Rfl: 0   Famotidine (  PEPCID PO), Take 1 tablet by mouth as needed., Disp: , Rfl:    HYDROcodone-acetaminophen (NORCO/VICODIN) 5-325 MG tablet, Take 1 tablet by mouth daily as needed for moderate pain., Disp: 45 tablet, Rfl: 0   metFORMIN (GLUCOPHAGE-XR) 500 MG 24 hr tablet, TAKE 2 TABLETS DAILY WITH BREAKFAST, Disp: 180 tablet, Rfl: 3   SUMAtriptan (IMITREX) 100 MG tablet, TAKE 1 TABLET BY MOUTH EVERY 2 HOURS AS NEEDED, MAX OF 2 TABLETS IN 24 HOURS, Disp:  10 tablet, Rfl: 0   Observations/Objective: There were no vitals taken for this visit.  Physical Exam  Physical Exam Constitutional:      General: The patient is not in acute distress. Pulmonary:     Effort: Pulmonary effort is normal. No respiratory distress.  Neurological:     Mental Status: The patient is alert and oriented to person, place, and time.  Psychiatric:        Mood and Affect: Mood normal.        Behavior: Behavior normal.    Assessment and Plan    Problem List Items Addressed This Visit     Acute cough - Primary    Acute  Likely initial viral infection now with bacterial superinfection.  Will treat with oral antibiotics as well as cough suppressant.  ER precautions and return precautions provided.        I discussed the assessment and treatment plan with the patient. The patient was provided an opportunity to ask questions and all were answered. The patient agreed with the plan and demonstrated an understanding of the instructions.   The patient was advised to call back or seek an in-person evaluation if the symptoms worsen or if the condition fails to improve as anticipated.     Eliezer Lofts, MD

## 2021-05-18 ENCOUNTER — Telehealth: Payer: 59 | Admitting: Family Medicine

## 2021-05-21 DIAGNOSIS — R051 Acute cough: Secondary | ICD-10-CM | POA: Insufficient documentation

## 2021-05-21 NOTE — Assessment & Plan Note (Signed)
Acute ? ?Likely initial viral infection now with bacterial superinfection.  Will treat with oral antibiotics as well as cough suppressant.  ER precautions and return precautions provided. ?

## 2021-06-06 ENCOUNTER — Encounter: Payer: Self-pay | Admitting: Urology

## 2021-06-06 DIAGNOSIS — C61 Malignant neoplasm of prostate: Secondary | ICD-10-CM

## 2021-06-07 ENCOUNTER — Other Ambulatory Visit
Admission: RE | Admit: 2021-06-07 | Discharge: 2021-06-07 | Disposition: A | Discharge status: Home / Self Care | Payer: 59 | Attending: Urology | Admitting: Urology

## 2021-06-07 ENCOUNTER — Other Ambulatory Visit: Payer: Self-pay

## 2021-06-07 ENCOUNTER — Telehealth: Payer: Self-pay

## 2021-06-07 DIAGNOSIS — C61 Malignant neoplasm of prostate: Secondary | ICD-10-CM | POA: Insufficient documentation

## 2021-06-07 NOTE — Telephone Encounter (Signed)
Spoke with pt. Would like to go to [place] location for labs. Labs ordered. Pt aware.  ?

## 2021-06-08 LAB — PSA: Prostatic Specific Antigen: <0.01 ng/mL (ref 0.00–4.00)

## 2021-06-10 ENCOUNTER — Encounter: Payer: Self-pay | Admitting: Urology

## 2021-06-11 ENCOUNTER — Other Ambulatory Visit: Payer: Self-pay | Admitting: Urology

## 2021-06-11 DIAGNOSIS — R310 Gross hematuria: Secondary | ICD-10-CM

## 2021-06-15 ENCOUNTER — Other Ambulatory Visit: Payer: Self-pay | Admitting: Family Medicine

## 2021-06-15 NOTE — Telephone Encounter (Signed)
Please schedule Diabetes follow up with fasting labs prior with Dr. Diona Browner.  ?

## 2021-06-25 ENCOUNTER — Encounter: Payer: 59 | Attending: Physician Assistant | Admitting: Physician Assistant

## 2021-06-25 DIAGNOSIS — Z8546 Personal history of malignant neoplasm of prostate: Secondary | ICD-10-CM | POA: Diagnosis not present

## 2021-06-25 DIAGNOSIS — N393 Stress incontinence (female) (male): Secondary | ICD-10-CM | POA: Insufficient documentation

## 2021-06-25 DIAGNOSIS — E1169 Type 2 diabetes mellitus with other specified complication: Secondary | ICD-10-CM | POA: Insufficient documentation

## 2021-06-25 DIAGNOSIS — N3041 Irradiation cystitis with hematuria: Secondary | ICD-10-CM | POA: Diagnosis present

## 2021-06-28 ENCOUNTER — Encounter: Payer: Self-pay | Admitting: Family Medicine

## 2021-06-28 NOTE — Telephone Encounter (Signed)
Last office visit 05/11/21 (virtual) for acute cough.  Last refilled 10/24/19 for 30 g with no refills.  No future appointments with PCP.  ?

## 2021-06-29 MED ORDER — BETAMETHASONE DIPROPIONATE 0.05 % EX CREA: TOPICAL_CREAM | Freq: Two times a day (BID) | CUTANEOUS | 0 refills | Status: AC

## 2021-06-29 NOTE — Progress Notes (Signed)
RANDLE, SHATZER ([id number]) ?Visit Report for 06/25/2021 ?Chief Complaint Document Details ?Patient Name: Dennis Hicks, Dennis Hicks ?Date of Service: 06/25/2021 8:45 AM ?Medical Record Number: [id number] ?Patient Account Number: [id number] ?Date of Birth/Sex: 1955/05/02 (66 y.o. M) ?Treating RN: Carlene Coria ?Primary Care Provider: Eliezer Lofts Other Clinician: ?Referring Provider: Dot Lanes ?Treating Provider/Extender: Jeri Cos ?Weeks in Treatment: 0 ?Information Obtained from: Patient ?Chief Complaint ?Radiation cystitis with hematuria ?Electronic Signature(s) ?Signed: 06/25/2021 4:57:15 PM By: Worthy Keeler PA-C ?Entered By: Worthy Keeler on 06/25/2021 16:57:14 ?WADE, ASEBEDO ([id number]) ?-------------------------------------------------------------------------------- ?HPI Details ?Patient Name: Dennis Hicks ?Date of Service: 06/25/2021 8:45 AM ?Medical Record Number: [id number] ?Patient Account Number: [id number] ?Date of Birth/Sex: [birth year] (66 y.o. M) ?Treating RN: Carlene Coria ?Primary Care Provider: Eliezer Lofts Other Clinician: ?Referring Provider: Dot Lanes ?Treating Provider/Extender: Jeri Cos ?Weeks in Treatment: 0 ?History of Present Illness ?HPI Description: 06-25-2021 upon evaluation today patient presents for initial inspection here in our clinic concerning evaluation for hyperbaric ?oxygen therapy. He is actually a gentleman who underwent treatment for prostate cancer. He is a referral from Dr. Alphonzo Severance at [facility] urology. He is ?CaP s/p RALP Dec 2019, EBRT 01/2019 with last PSA <0.01. He does have erectile dysfunction refractory to oral medication and unfortunately ?also has stress urinary incontinence which is quite severe. 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 The patient subsequently had 7600 cGy  to his prostatic fossa and local regional nodes over 7 weeks of treatment which was ?performed in the period of time from October 18, 2018 to completion by Dr. Baruch Gouty. Subsequently the patient has done well since but unfortunately ?the radiation damage has caused further complication for him in general. He is actually set to have a prosthetic sphincter implanted on ?07/02/2021. Subsequently however that is completely separate issue from the radiation cystitis that he is experiencing with hematuria. This should ?not interfere with this HBO therapy if indeed this is something that he proceeds forward with. The patient is a diabetic so he will have to go under ?the diabetic protocol although he does take metformin he actually does not really check his blood sugars on any type of regular schedule. ?Electronic Signature(s) ?Signed: 06/25/2021 4:58:16 PM By: Worthy Keeler PA-C ?Entered By: Worthy Keeler on 06/25/2021 16:58:16 ?HELAMAN, MECCA F. ([id number]) ?-------------------------------------------------------------------------------- ?Physical Exam Details ?Patient Name: Dennis Hicks ?Date of Service: 06/25/2021 8:45 AM ?Medical Record Number: [id number] ?Patient Account Number: [id number] ?Date of Birth/Sex: 10-16-1955 (66 y.o. M) ?Treating RN: Carlene Coria ?Primary Care Provider: Eliezer Lofts Other Clinician: ?Referring Provider: Dot Lanes ?Treating Provider/Extender: Jeri Cos ?Weeks in Treatment: 0 ?Constitutional ?sitting or standing blood pressure is within target range for patient.. pulse regular and within target range for patient.Marland Kitchen respirations regular, non- ?labored and within target range for patient.Marland Kitchen temperature within target range for patient.. Well-nourished and well-hydrated in no acute distress. ?Eyes ?conjunctiva clear no eyelid edema noted. pupils equal round and reactive to light and accommodation. ?Ears, Nose, Mouth, and Throat ?no gross abnormality of ear auricles or external auditory canals.  normal hearing noted during conversation. mucus membranes moist. ?Respiratory ?normal breathing without difficulty. ?Musculoskeletal ?normal gait and posture. no significant deformity or arthritic changes, no loss or range of motion, no clubbing. ?Psychiatric ?this patient is able to make decisions and demonstrates good insight into disease process. Alert  and Oriented x 3. pleasant and cooperative. ?Notes ?Upon evaluation as I discussed this with him he tells me that he still continued to have bleeding and again this is completely unrelated to the need ?for the sphincter implant due to the stress incontinence. His blood pressure seems to be doing well which is great everything else physically seems ?to check out well he is not a smoker he has no lung conditions he has a normal EKG which was available for review today and appears to be doing ?just fine. Subsequently I really see no major complications that would prevent him from hyperbaric oxygen therapy if we can gain approval and ?get everything situated from the standpoint of when we start. ?Electronic Signature(s) ?Signed: 06/25/2021 4:59:20 PM By: Worthy Keeler PA-C ?Entered By: Worthy Keeler on 06/25/2021 16:59:19 ?WHITAKER, HOLDERMAN F. ([id number]) ?-------------------------------------------------------------------------------- ?Physician Orders Details ?Patient Name: Dennis Hicks ?Date of Service: 06/25/2021 8:45 AM ?Medical Record Number: [id number] ?Patient Account Number: [id number] ?Date of Birth/Sex: 06-Jul-1955 (66 y.o. M) ?Treating RN: Carlene Coria ?Primary Care Provider: Eliezer Lofts Other Clinician: ?Referring Provider: Dot Lanes ?Treating Provider/Extender: Jeri Cos ?Weeks in Treatment: 0 ?Verbal / Phone Orders: No ?Diagnosis Coding ?Follow-up Appointments ?o Return Appointment in: - upon approval of HBO ?Hyperbaric Oxygen Therapy ?o Evaluate for HBO Therapy ?o Indication and location: - radiation cystitis ?o If appropriate for treatment,  begin HBOT per protocol: ?o 2.5 ATA for 90 Minutes with 2 Five (5) Minute Air Breaks ?o One treatment per day (delivered Monday through Friday unless otherwise specified in Special Instructions below): ?o Total # of Treatments: - 40 ?o Antihistamine 30 minutes prior to HBO Treatment, difficulty clearing ears. ?o Finger stick Blood Glucose Pre- and Post- HBOT Treatment. ?o Follow Hyperbaric Oxygen Glycemia Protocol ?GLYCEMIA INTERVENTIONS PROTOCOL ?PRE-HBO GLYCEMIA INTERVENTIONS ?ACTION INTERVENTION ?Obtain pre-HBO capillary blood glucose (ensure ?1 ?physician order is in chart). ?A. Notify HBO physician and await physician ?orders. ?2 If result is 70 mg/dl or below: ?B. If the result meets the hospital definition ?of a critical result, follow hospital policy. ?A. Give patient an 8 ounce Glucerna Shake??, ?an 8 ounce Ensure??, or 8 ounces of a ?Glucerna/Ensure equivalent dietary ?supplement*. ?B. Wait 30 minutes. ?If result is 71 mg/dl to 130 mg/dl: C. Retest patientos capillary blood glucose ?(CBG). ?D. If result greater than or equal to 110 ?mg/dl, proceed with HBO. ?If result less than 110 mg/dl, notify HBO ?physician and consider holding HBO. ?If result is 131 mg/dl to 249 mg/dl: A. Proceed with HBO. ?A. Notify HBO physician and await physician ?orders. ?B. It is recommended to hold HBO and do ?If result is 250 mg/dl or greater: ?blood/urine ketone testing. ?C. If the result meets the hospital definition ?of a critical result, follow hospital policy. ?POST-HBO GLYCEMIA INTERVENTIONS ?ACTION INTERVENTION ?Obtain post HBO capillary blood glucose (ensure ?1 ?physician order is in chart). ?A. Notify HBO physician and await physician ?orders. ?2 If result is 70 mg/dl or below: ?B. If the result meets the hospital definition ?of a critical result, follow hospital policy. ?A. Give patient an 8 ounce Glucerna Shake??, ?an 8 ounce Ensure??, or 8 ounces of a ?Glucerna/Ensure equivalent dietary ?supplement*. ?B.  Wait 15 minutes for symptoms of ?hypoglycemia (i.e. nervousness, anxiety, ?If result is 71 mg/dl to 100 mg/dl: sweating, chills, clamminess, irritability, ?confusion, tachycardia or dizziness). ?C. If

## 2021-06-29 NOTE — Progress Notes (Signed)
MAVRIC, CORTRIGHT ([id number]) ?Visit Report for 06/25/2021 ?HBO Patient Questionnaire Details ?Patient Name: Dennis Hicks, Dennis Hicks ?Date of Service: 06/25/2021 8:45 AM ?Medical Record Number: [id number] ?Patient Account Number: [id number] ?Date of Birth/Sex: June 09, 1955 (66 y.o. M) ?Treating RN: Carlene Coria ?Primary Care Cheston Coury: Eliezer Lofts Other Clinician: ?Referring Bow Buntyn: Dot Lanes ?Treating Naeemah Jasmer/Extender: Jeri Cos ?Weeks in Treatment: 0 ?HBO Patient Questionnaire Items ?Answer ?Any "Yes" answers must be brought to the hyperbaric physician's attention. ?Breathing or Lung problemso No ?Currently use tobacco productso No ?Used tobacco products in the pasto No ?Heart problemso No ?Do you take water pills (diuretic)o No ?Diabeteso Yes ?On Diabetes pillo Yes ?On Insulino No ?Dialysiso No ?Eye problems like glaucomao No ?Ear problems or surgeryo No ?Sinus Problemso No ?Cancero Yes ?List the location: prostate ?Surgery(s) for cancero Yes ?Radiation therapy for cancero Yes ?List the location of the radiation therapy: groin ?Last treatment for radiation therapyo nov 2020 ?Chemotherapy for cancero No ?Confinement Anxiety (Claustrophobia- fear of confined places)o No ?Any medical implants/devices that are fully or partially implanted or attached to your bodyo No ?Pregnanto No ?Seizureso No ?Electronic Signature(s) ?Signed: 06/29/2021 8:57:31 AM By: Carlene Coria RN ?Entered By: Carlene Coria on 06/25/2021 09:06:00 ?

## 2021-06-29 NOTE — Progress Notes (Signed)
DONATHAN, BULLER ([id number]) ?Visit Report for 06/25/2021 ?Allergy List Details ?Patient Name: Dennis Hicks, Dennis Hicks ?Date of Service: 06/25/2021 8:45 AM ?Medical Record Number: [id number] ?Patient Account Number: [id number] ?Date of Birth/Sex: Oct 07, 1955 (66 y.o. M) ?Treating RN: Dennis Hicks ?Primary Care Dennis Hicks: Dennis Hicks Other Clinician: ?Referring Dennis Hicks: Dennis Hicks ?Treating Dennis Hicks/Extender: Dennis Hicks ?Weeks in Treatment: 0 ?Allergies ?Active Allergies ?chocolate flavor ?ibuprofen ?shrimp ?Allergy Notes ?Electronic Signature(s) ?Signed: 06/29/2021 8:57:31 AM By: Dennis Coria RN ?Entered By: Dennis Hicks on 06/25/2021 08:57:06 ?Dennis Hicks, Dennis F. ([id number]) ?-------------------------------------------------------------------------------- ?Arrival Information Details ?Patient Name: Dennis Hicks, Dennis Hicks ?Date of Service: 06/25/2021 8:45 AM ?Medical Record Number: [id number] ?Patient Account Number: [id number] ?Date of Birth/Sex: 06-29-1955 (66 y.o. M) ?Treating RN: Dennis Hicks ?Primary Care Dennis Hicks: Dennis Hicks Other Clinician: ?Referring Dennis Hicks: Dennis Hicks ?Treating Dennis Hicks/Extender: Dennis Hicks ?Weeks in Treatment: 0 ?Visit Information ?Patient Arrived: Ambulatory ?Arrival Time: 08:48 ?Accompanied By: self ?Transfer Assistance: None ?Patient Identification Verified: No ?Secondary Verification Process Completed: No ?Patient Requires Transmission-Based Precautions: No ?Patient Has Alerts: No ?Electronic Signature(s) ?Signed: 06/29/2021 8:57:31 AM By: Dennis Coria RN ?Entered By: Dennis Hicks on 06/25/2021 08:54:21 ?[name], [name] ([id number]) ?-------------------------------------------------------------------------------- ?Clinic Level of Care Assessment Details ?Patient Name: Dennis Hicks, Dennis Hicks ?Date of Service: 06/25/2021 8:45 AM ?Medical Record Number: [id number] ?Patient Account Number: [id number] ?Date of Birth/Sex: 03/30/1955 (66 y.o. M) ?Treating RN: Dennis Hicks ?Primary Care Dennis Hicks: Dennis Hicks Other  Clinician: ?Referring Dennis Hicks: Dennis Hicks ?Treating Dennis Hicks/Extender: Dennis Hicks ?Weeks in Treatment: 0 ?Clinic Level of Care Assessment Items ?TOOL 2 Quantity Score ?X - Use when only an EandM is performed on the INITIAL visit 1 0 ?ASSESSMENTS - Nursing Assessment / Reassessment ?X - General Physical Exam (combine w/ comprehensive assessment (listed just below) when performed on new ?1 20 ?pt. evals) ?X- 1 25 ?Comprehensive Assessment (HX, ROS, Risk Assessments, Wounds Hx, etc.) ?ASSESSMENTS - Wound and Skin Assessment / Reassessment ?'[]'$  - Simple Wound Assessment / Reassessment - one wound 0 ?'[]'$  - 0 ?Complex Wound Assessment / Reassessment - multiple wounds ?'[]'$  - 0 ?Dermatologic / Skin Assessment (not related to wound area) ?ASSESSMENTS - Ostomy and/or Continence Assessment and Care ?'[]'$  - Incontinence Assessment and Management 0 ?'[]'$  - 0 ?Ostomy Care Assessment and Management (repouching, etc.) ?PROCESS - Coordination of Care ?'[]'$  - Simple Patient / Family Education for ongoing care 0 ?'[]'$  - 0 ?Complex (extensive) Patient / Family Education for ongoing care ?'[]'$  - 0 ?Staff obtains Consents, Records, Test Results / Process Orders ?'[]'$  - 0 ?Staff telephones HHA, Nursing Homes / Clarify orders / etc ?'[]'$  - 0 ?Routine Transfer to another Facility (non-emergent condition) ?'[]'$  - 0 ?Routine Hospital Admission (non-emergent condition) ?'[]'$  - 0 ?New Admissions / Biomedical engineer / Ordering NPWT, Apligraf, etc. ?'[]'$  - 0 ?Emergency Hospital Admission (emergent condition) ?'[]'$  - 0 ?Simple Discharge Coordination ?'[]'$  - 0 ?Complex (extensive) Discharge Coordination ?PROCESS - Special Needs ?'[]'$  - Pediatric / Minor Patient Management 0 ?'[]'$  - 0 ?Isolation Patient Management ?'[]'$  - 0 ?Hearing / Language / Visual special needs ?'[]'$  - 0 ?Assessment of Community assistance (transportation, D/C planning, etc.) ?'[]'$  - 0 ?Additional assistance / Altered mentation ?'[]'$  - 0 ?Support Surface(s) Assessment (bed, cushion, seat,  etc.) ?INTERVENTIONS - Wound Cleansing / Measurement ?'[]'$  - Wound Imaging (photographs - any number of wounds) 0 ?'[]'$  - 0 ?Wound Tracing (instead of photographs) ?'[]'$  - 0 ?Simple Wound Measurement - one wound ?'[]'$  - 0 ?Complex Wound Measurement - multiple wounds ?Dennis Hicks, Dennis Hicks ([id number]) ?'[]'$  - 0 ?Simple Wound Cleansing -  one wound ?'[]'$  - 0 ?Complex Wound Cleansing - multiple wounds ?INTERVENTIONS - Wound Dressings ?'[]'$  - Small Wound Dressing one or multiple wounds 0 ?'[]'$  - 0 ?Medium Wound Dressing one or multiple wounds ?'[]'$  - 0 ?Large Wound Dressing one or multiple wounds ?'[]'$  - 0 ?Application of Medications - injection ?INTERVENTIONS - Miscellaneous ?'[]'$  - External ear exam 0 ?'[]'$  - 0 ?Specimen Collection (cultures, biopsies, blood, body fluids, etc.) ?'[]'$  - 0 ?Specimen(s) / Culture(s) sent or taken to Lab for analysis ?'[]'$  - 0 ?Patient Transfer (multiple staff / Civil Service fast streamer / Similar devices) ?'[]'$  - 0 ?Simple Staple / Suture removal (25 or less) ?'[]'$  - 0 ?Complex Staple / Suture removal (26 or more) ?'[]'$  - 0 ?Hypo / Hyperglycemic Management (close monitor of Blood Glucose) ?'[]'$  - 0 ?Ankle / Brachial Index (ABI) - do not check if billed separately ?Has the patient been seen at the hospital within the last three years: Yes ?Total Score: 45 ?Level Of Care: New/Established - Level ?2 ?Electronic Signature(s) ?Signed: 06/29/2021 8:57:31 AM By: Dennis Coria RN ?Entered By: Dennis Hicks on 06/25/2021 09:37:53 ?Dennis Hicks, Dennis F. ([id number]) ?-------------------------------------------------------------------------------- ?Encounter Discharge Information Details ?Patient Name: Dennis Hicks, Dennis Hicks ?Date of Service: 06/25/2021 8:45 AM ?Medical Record Number: [id number] ?Patient Account Number: [id number] ?Date of Birth/Sex: 1955-06-20 (66 y.o. M) ?Treating RN: Dennis Hicks ?Primary Care Vickye Astorino: Dennis Hicks Other Clinician: ?Referring Mata Rowen: Dennis Hicks ?Treating Dayzha Pogosyan/Extender: Dennis Hicks ?Weeks in Treatment: 0 ?Encounter Discharge  Information Items ?Discharge Condition: Stable ?Ambulatory Status: Ambulatory ?Discharge Destination: Home ?Transportation: Private Auto ?Accompanied By: self ?Schedule Follow-up Appointment: Yes ?Clinical Summary of Care: Patient Declined ?Electronic Signature(s) ?Signed: 06/29/2021 8:57:31 AM By: Dennis Coria RN ?Entered By: Dennis Hicks on 06/25/2021 09:39:17 ?Dennis Hicks, Dennis F. ([id number]) ?-------------------------------------------------------------------------------- ?Multi Wound Chart Details ?Patient Name: Dennis Hicks, Dennis Hicks ?Date of Service: 06/25/2021 8:45 AM ?Medical Record Number: [id number] ?Patient Account Number: [id number] ?Date of Birth/Sex: 02-May-1955 (65 y.o. M) ?Treating RN: Dennis Hicks ?Primary Care Lena Gores: Dennis Hicks Other Clinician: ?Referring Tennis Mckinnon: Dennis Hicks ?Treating Aharon Carriere/Extender: Dennis Hicks ?Weeks in Treatment: 0 ?Vital Signs ?Height(in): 68 ?Pulse(bpm): 73 ?Weight(lbs): 185 ?Blood Pressure(mmHg): 124/78 ?Body Mass Index(BMI): 28.1 ?Temperature(??F): 98.3 ?Respiratory Rate(breaths/min): 18 ?Wound Assessments ?Treatment Notes ?Electronic Signature(s) ?Signed: 06/29/2021 8:57:31 AM By: Dennis Coria RN ?Entered By: Dennis Hicks on 06/25/2021 09:30:17 ?Dennis Hicks, Dennis F. ([id number]) ?-------------------------------------------------------------------------------- ?Multi-Disciplinary Care Plan Details ?Patient Name: Dennis Hicks, Dennis Hicks ?Date of Service: 06/25/2021 8:45 AM ?Medical Record Number: [id number] ?Patient Account Number: [id number] ?Date of Birth/Sex: [birth year] (67 y.o. M) ?Treating RN: Dennis Hicks ?Primary Care Elverda Wendel: Dennis Hicks Other Clinician: ?Referring Chelsi Warr: Dennis Hicks ?Treating Reuel Lamadrid/Extender: Dennis Hicks ?Weeks in Treatment: 0 ?Active Inactive ?HBO ?Nursing Diagnoses: ?Anxiety related to feelings of confinement associated with the hyperbaric oxygen chamber ?Anxiety related to knowledge deficit of hyperbaric oxygen therapy and treatment procedures ?Discomfort  related to temperature and humidity changes inside hyperbaric chamber ?Potential for barotraumas to ears, sinuses, teeth, and lungs or cerebral gas embolism related to changes in atmospheric pressure inside hyperbaric ?oxygen chamber ?P

## 2021-06-29 NOTE — Progress Notes (Signed)
GILLES, TRIMPE ([id number]) ?Visit Report for 06/25/2021 ?Abuse Risk Screen Details ?Patient Name: Dennis Hicks, Dennis Hicks ?Date of Service: 06/25/2021 8:45 AM ?Medical Record Number: [id number] ?Patient Account Number: [id number] ?Date of Birth/Sex: 1955/07/31 (66 y.o. M) ?Treating RN: Carlene Coria ?Primary Care Ainslee Sou: Eliezer Lofts Other Clinician: ?Referring Daishaun Ayre: Dot Lanes ?Treating Johnny Gorter/Extender: Jeri Cos ?Weeks in Treatment: 0 ?Abuse Risk Screen Items ?Answer ?ABUSE RISK SCREEN: ?Has anyone close to you tried to hurt or harm you recentlyo No ?Do you feel uncomfortable with anyone in your familyo No ?Has anyone forced you do things that you didnot want to doo No ?Electronic Signature(s) ?Signed: 06/29/2021 8:57:31 AM By: Carlene Coria RN ?Entered By: Carlene Coria on 06/25/2021 09:01:40 ?DORIS, MCGILVERY F. ([id number]) ?-------------------------------------------------------------------------------- ?Activities of Daily Living Details ?Patient Name: Dennis Hicks ?Date of Service: 06/25/2021 8:45 AM ?Medical Record Number: [id number] ?Patient Account Number: [id number] ?Date of Birth/Sex: 1956-01-21 (66 y.o. M) ?Treating RN: Carlene Coria ?Primary Care Varetta Chavers: Eliezer Lofts Other Clinician: ?Referring Ignacia Gentzler: Dot Lanes ?Treating Janene Yousuf/Extender: Jeri Cos ?Weeks in Treatment: 0 ?Activities of Daily Living Items ?Answer ?Activities of Daily Living (Please select one for each item) ?Ragan ?Take Medications Completely Able ?Use Telephone Completely Able ?Care for Appearance Completely Able ?Use Toilet Completely Able ?Bath / Shower Completely Able ?Dress Self Completely Able ?Feed Self Completely Able ?Walk Completely Able ?Get In / Out Bed Completely Able ?Housework Completely Able ?Prepare Meals Completely Able ?Handle Money Completely Able ?Shop for Self Completely Able ?Electronic Signature(s) ?Signed: 06/29/2021 8:57:31 AM By: Carlene Coria RN ?Entered By: Carlene Coria on  06/25/2021 09:02:08 ?JOHNCARLOS, HOLTSCLAW F. ([id number]) ?-------------------------------------------------------------------------------- ?Education Screening Details ?Patient Name: Dennis Hicks ?Date of Service: 06/25/2021 8:45 AM ?Medical Record Number: [id number] ?Patient Account Number: [id number] ?Date of Birth/Sex: [birth year] (66 y.o. M) ?Treating RN: Carlene Coria ?Primary Care Lacoya Wilbanks: Eliezer Lofts Other Clinician: ?Referring Challen Spainhour: Dot Lanes ?Treating Masaichi Kracht/Extender: Jeri Cos ?Weeks in Treatment: 0 ?Primary Learner Assessed: Patient ?Learning Preferences/Education Level/Primary Language ?Learning Preference: Explanation ?Highest Education Level: College or Above ?Preferred Language: English ?Cognitive Barrier ?Language Barrier: No ?Translator Needed: No ?Memory Deficit: No ?Emotional Barrier: No ?Cultural/Religious Beliefs Affecting Medical Care: No ?Physical Barrier ?Impaired Vision: No ?Impaired Hearing: No ?Decreased Hand dexterity: No ?Knowledge/Comprehension ?Knowledge Level: Medium ?Comprehension Level: High ?Ability to understand written instructions: High ?Ability to understand verbal instructions: High ?Motivation ?Anxiety Level: Anxious ?Cooperation: Cooperative ?Education Importance: Acknowledges Need ?Interest in Health Problems: Asks Questions ?Perception: Coherent ?Willingness to Engage in Self-Management ?High ?Activities: ?Readiness to Engage in Self-Management ?High ?Activities: ?Electronic Signature(s) ?Signed: 06/29/2021 8:57:31 AM By: Carlene Coria RN ?Entered By: Carlene Coria on 06/25/2021 09:02:34 ?ADI, DORO F. ([id number]) ?-------------------------------------------------------------------------------- ?Fall Risk Assessment Details ?Patient Name: Dennis Hicks ?Date of Service: 06/25/2021 8:45 AM ?Medical Record Number: [id number] ?Patient Account Number: [id number] ?Date of Birth/Sex: 1955/10/16 (66 y.o. M) ?Treating RN: Carlene Coria ?Primary Care Sahvannah Rieser: Eliezer Lofts Other  Clinician: ?Referring Velina Drollinger: Dot Lanes ?Treating Keymari Sato/Extender: Jeri Cos ?Weeks in Treatment: 0 ?Fall Risk Assessment Items ?Have you had 2 or more falls in the last 12 monthso 0 No ?Have you had any fall that resulted in injury in the last 12 monthso 0 No ?FALLS RISK SCREEN ?History of falling - immediate or within 3 months 0 No ?Secondary diagnosis (Do you have 2 or more medical diagnoseso) 0 No ?Ambulatory aid ?None/bed rest/wheelchair/nurse 0 No ?Crutches/cane/walker 0 No ?Furniture 0 No ?Intravenous therapy Access/Saline/Heparin Lock 0 No ?Gait/Transferring ?Normal/ bed rest/ wheelchair 0 No ?Weak (short steps with  or without shuffle, stooped but able to lift head while walking, may ?0 No ?seek support from furniture) ?Impaired (short steps with shuffle, may have difficulty arising from chair, head down, impaired ?0 No ?balance) ?Mental Status ?Oriented to own ability 0 No ?Electronic Signature(s) ?Signed: 06/29/2021 8:57:31 AM By: Carlene Coria RN ?Entered By: Carlene Coria on 06/25/2021 09:02:40 ?SHLOIME, KEILMAN F. ([id number]) ?-------------------------------------------------------------------------------- ?Foot Assessment Details ?Patient Name: Dennis Hicks ?Date of Service: 06/25/2021 8:45 AM ?Medical Record Number: [id number] ?Patient Account Number: [id number] ?Date of Birth/Sex: 1955/04/11 (66 y.o. M) ?Treating RN: Carlene Coria ?Primary Care Pieper Kasik: Eliezer Lofts Other Clinician: ?Referring Riku Buttery: Dot Lanes ?Treating Anntionette Madkins/Extender: Jeri Cos ?Weeks in Treatment: 0 ?Foot Assessment Items ?Site Locations ?+ = Sensation present, - = Sensation absent, C = Callus, U = Ulcer ?R = Redness, W = Warmth, M = Maceration, PU = Pre-ulcerative lesion ?F = Fissure, S = Swelling, D = Dryness ?Assessment ?Right: Left: ?Other Deformity: No No ?Prior Foot Ulcer: No No ?Prior Amputation: No No ?Charcot Joint: No No ?Ambulatory Status: Ambulatory Without Help ?Gait: Steady ?Electronic  Signature(s) ?Signed: 06/29/2021 8:57:31 AM By: Carlene Coria RN ?Entered By: Carlene Coria on 06/25/2021 09:02:58 ?DARWYN, PONZO F. ([id number]) ?-------------------------------------------------------------------------------- ?Nutrition Risk Screening Details ?Patient Name: KODEN, HUNZEKER ?Date of Service: 06/25/2021 8:45 AM ?Medical Record Number: [id number] ?Patient Account Number: [id number] ?Date of Birth/Sex: [birth year] (66 y.o. M) ?Treating RN: Carlene Coria ?Primary Care Klaire Court: Eliezer Lofts Other Clinician: ?Referring Ariston Grandison: Dot Lanes ?Treating Stephanee Barcomb/Extender: Jeri Cos ?Weeks in Treatment: 0 ?Height (in): 68 ?Weight (lbs): 185 ?Body Mass Index (BMI): 28.1 ?Nutrition Risk Screening Items ?Score Screening ?NUTRITION RISK SCREEN: ?I have an illness or condition that made me change the kind and/or amount of food I eat 0 No ?I eat fewer than two meals per day 0 No ?I eat few fruits and vegetables, or milk products 0 No ?I have three or more drinks of beer, liquor or wine almost every day 0 No ?I have tooth or mouth problems that make it hard for me to eat 0 No ?I don't always have enough money to buy the food I need 0 No ?I eat alone most of the time 0 No ?I take three or more different prescribed or over-the-counter drugs a day 1 Yes ?Without wanting to, I have lost or gained 10 pounds in the last six months 0 No ?I am not always physically able to shop, [place] and/or feed myself 0 No ?Nutrition Protocols ?Good Risk Protocol 0 No interventions needed ?Moderate Risk Protocol ?High Risk Proctocol ?Risk Level: Good Risk ?Score: 1 ?Electronic Signature(s) ?Signed: 06/29/2021 8:57:31 AM By: Carlene Coria RN ?Entered By: Carlene Coria on 06/25/2021 09:02:49 ?

## 2021-07-05 ENCOUNTER — Encounter: Payer: 59 | Admitting: Physician Assistant

## 2021-07-05 DIAGNOSIS — N3041 Irradiation cystitis with hematuria: Secondary | ICD-10-CM | POA: Diagnosis not present

## 2021-07-05 LAB — GLUCOSE, CAPILLARY
Glucose-Capillary: 220 mg/dL — ABNORMAL HIGH (ref 70–99)
Glucose-Capillary: 130 mg/dL — ABNORMAL HIGH (ref 70–99)

## 2021-07-05 NOTE — Progress Notes (Addendum)
AVONTAE, BURKHEAD ([id number]) ?Visit Report for 07/05/2021 ?HBO Details ?Patient Name: Dennis Hicks, Dennis Hicks ?Date of Service: 07/05/2021 8:00 AM ?Medical Record Number: [id number] ?Patient Account Number: [id number] ?Date of Birth/Sex: 04-May-1955 (66 y.o. 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 ?HBO Treatment Details ?Treatment Number: 1 ?Patient Type: Outpatient ?Chamber Type: Monoplace ?Chamber Serial #: [id number] ?Treatment Protocol: 2.5 ATA with 90 minutes oxygen, with two 5 minute air breaks ?Treatment Details ?Compression Rate Down: 1.0 psi / minute ?De-Compression Rate Up: 1.5 psi / minute ?Compress Tx Pressure Air breaks and breathing periods Decompress Decompress ?Begins Reached (leave unused spaces blank) Begins Ends ?Chamber Pressure (ATA) 1 2.5 2.5 2.5 2.5 2.5 - - 2.5 1 ?Clock Time (24 hr) 08:28 08:49 09:20 09:25 09:56 10:01 - - 10:32 10:46 ?Treatment Length: 138 (minutes) ?Treatment Segments: 5 ?Vital Signs ?Capillary Blood Glucose Reference Range: 80 - 120 mg / dl ?HBO Diabetic Blood Glucose Intervention Range: <131 mg/dl or >249 mg/dl ?Time Vitals Blood Respiratory Capillary Blood Glucose Pulse Action ?Type: ?Pulse: Temperature: ?Taken: ?Pressure: ?Rate: ?Glucose (mg/dl): ?Meter #: Oximetry (%) Taken: ?Pre 08:06 120/66 102 18 98.5 220 1 none per protocol ?Post 11:01 126/76 60 18 98.3 130 1 none per protocol ?Pre-Treatment Ear Evaluation ?Left Right ?Clear: Yes ?Clear: Yes ?Intact: Yes ?Intact: Yes ?Color: grey ?Color: grey ?PE Tubes inserted: No ?PE Tubes inserted: No ?Irrigated: No ?Irrigated: No ?Myringotomy performed: No ?Myringotomy performed:  No ?Left Teed Scale: Grade 0 ?Right Teed Scale: Grade 0 ?Treatment Response ?Treatment Toleration: Well ?Treatment Completion ?Treatment Completed without Adverse Event ?Status: ?HBO Attestation ?I certify that I supervised this HBO treatment in accordance with ?Medicare guidelines. A trained emergency response team is readily Yes ?available per hospital policies and procedures. ?Continue HBOT as ordered. Yes ?Electronic Signature(s) ?Dennis Hicks, Dennis Hicks ([id number]) ?Signed: 07/05/2021 4:49:48 PM By: Worthy Keeler PA-C ?Previous Signature: 07/05/2021 1:01:47 PM Version By: Enedina Finner RCP, RRT, CHT ?Previous Signature: 07/05/2021 4:40:44 PM Version By: Worthy Keeler PA-C ?Entered By: Worthy Keeler on 07/05/2021 16:49:47 ?Dennis Hicks, Dennis Hicks ([id number]) ?-------------------------------------------------------------------------------- ?HBO Safety Checklist Details ?Patient Name: Dennis Hicks, Dennis Hicks ?Date of Service: 07/05/2021 8:00 AM ?Medical Record Number: [id number] ?Patient Account Number: [id number] ?Date of Birth/Sex: Aug 17, 1955 (66 y.o. M) ?Treating RN: Carlene Coria ?Primary Care Craige Patel: Diona Browner, Amy ?Other Clinician: Jacqulyn Bath ?Referring Sya Nestler: Diona Browner, Amy ?Treating Arnaldo Heffron/Extender: Jeri Cos ?Weeks in Treatment: 1 ?HBO Safety Checklist Items ?Safety Checklist ?Consent Form Signed ?Patient voided / foley secured and emptied ?When did you last eato 07:00 ?Last dose of injectable or oral agent 07:00 ?Ostomy pouch emptied and vented if applicable ?NA ?All implantable devices assessed, documented and approved ?NA ?Intravenous access site secured and place ?NA ?Valuables secured ?Linens and cotton and cotton/polyester blend (less than 51% polyester) ?Personal oil-based products / skin lotions / body lotions removed ?Wigs or hairpieces removed ?NA ?Smoking or tobacco materials removed ?NA ?Books / newspapers / magazines / loose paper removed ?NA ?Cologne, aftershave, perfume and deodorant  removed ?Jewelry removed (may wrap wedding band) ?Make-up removed ?NA ?Hair care products removed ?Battery operated devices (external) removed ?NA ?Heating patches and chemical warmers removed ?NA ?Titanium eyewear removed ?NA ?Nail polish cured greater than 10 hours ?NA ?Casting material cured greater  than 10 hours ?NA ?Hearing aids removed ?NA ?Loose dentures or partials removed ?NA ?Prosthetics have been removed ?NA ?Patient demonstrates correct use of air break device (if applicable) ?Patient concerns have been addressed ?Patient grounding bracelet on and cord attached to chamber ?Specifics for Inpatients (complete in addition to ?above) ?Medication sheet sent with patient ?Intravenous medications needed or due during therapy sent with patient ?Drainage tubes (e.g. nasogastric tube or chest tube secured and vented) ?Endotracheal or Tracheotomy tube secured ?Cuff deflated of air and inflated with saline ?Airway suctioned ?Electronic Signature(s) ?Signed: 07/05/2021 1:01:47 PM By: Enedina Finner RCP, RRT, CHT ?Entered By: Enedina Finner on 07/05/2021 08:44:27 ?

## 2021-07-05 NOTE — Progress Notes (Signed)
CAINE, BARFIELD ([id number]) ?Visit Report for 07/05/2021 ?Arrival Information Details ?Patient Name: Dennis Hicks, Dennis Hicks ?Date of Service: 07/05/2021 8:00 AM ?Medical Record Number: [id number] ?Patient Account Number: [id number] ?Date of Birth/Sex: 02-18-1956 (66 y.o. M) ?Treating RN: Carlene Coria ?Primary Care Shambhavi Salley: Diona Browner, Amy ?Other Clinician: Jacqulyn Bath ?Referring Darris Carachure: Diona Browner, Amy ?Treating Jamelle Noy/Extender: Jeri Cos ?Weeks in Treatment: 1 ?Visit Information History Since Last Visit ?Added or deleted any medications: No ?Patient Arrived: Ambulatory ?Any new allergies or adverse reactions: No ?Arrival Time: 08:00 ?Had a fall or experienced change in No ?Accompanied By: self ?activities of daily living that may affect ?Transfer Assistance: None ?risk of falls: ?Patient Identification Verified: Yes ?Signs or symptoms of abuse/neglect since last visito No ?Secondary Verification Process Completed: Yes ?Hospitalized since last visit: No ?Patient Requires Transmission-Based Precautions: No ?Implantable device outside of the clinic excluding No ?Patient Has Alerts: No ?cellular tissue based products placed in the center ?since last visit: ?Pain Present Now: No ?Electronic Signature(s) ?Signed: 07/05/2021 1:01:47 PM By: Enedina Finner RCP, RRT, CHT ?Entered By: Enedina Finner on 07/05/2021 08:42:15 ?QUARON, DELACRUZ F. ([id number]) ?-------------------------------------------------------------------------------- ?Encounter Discharge Information Details ?Patient Name: Dennis Hicks ?Date of Service: 07/05/2021 8:00 AM ?Medical Record Number: [id number] ?Patient Account Number: [id number] ?Date of Birth/Sex: 12-Jan-1956 (66 y.o. M) ?Treating RN: Carlene Coria ?Primary Care Saleena Tamas: Diona Browner, Amy ?Other Clinician: Jacqulyn Bath ?Referring Jacqueleen Pulver: Diona Browner, Amy ?Treating Olvin Rohr/Extender: Jeri Cos ?Weeks in Treatment: 1 ?Encounter Discharge Information Items ?Discharge Condition: Stable ?Ambulatory  Status: Ambulatory ?Discharge Destination: Home ?Transportation: Private Auto ?Accompanied By: self ?Schedule Follow-up Appointment: Yes ?Clinical Summary of Care: ?Notes ?Patient has an HBO treatment scheduled on 07/06/21 at 08:00 am. ?Electronic Signature(s) ?Signed: 07/05/2021 1:01:47 PM By: Enedina Finner RCP, RRT, CHT ?Entered By: Enedina Finner on 07/05/2021 12:12:17 ?SHERMON, BOZZI F. ([id number]) ?-------------------------------------------------------------------------------- ?Vitals Details ?Patient Name: Dennis Hicks ?Date of Service: 07/05/2021 8:00 AM ?Medical Record Number: [id number] ?Patient Account Number: [id number] ?Date of Birth/Sex: 06-Nov-1955 (66 y.o. M) ?Treating RN: Carlene Coria ?Primary Care Samya Siciliano: Diona Browner, Amy ?Other Clinician: Jacqulyn Bath ?Referring Elira Colasanti: Diona Browner, Amy ?Treating Chandani Rogowski/Extender: Jeri Cos ?Weeks in Treatment: 1 ?Vital Signs ?Time Taken: 08:06 ?Temperature (??F): 98.5 ?Height (in): 68 ?Pulse (bpm): 102 ?Weight (lbs): 185 ?Respiratory Rate (breaths/min): 18 ?Body Mass Index (BMI): 28.1 ?Blood Pressure (mmHg): 120/66 ?Capillary Blood Glucose (mg/dl): 220 ?Reference Range: 80 - 120 mg / dl ?Electronic Signature(s) ?Signed: 07/05/2021 1:01:47 PM By: Enedina Finner RCP, RRT, CHT ?Entered By: Enedina Finner on 07/05/2021 08:43:08 ?

## 2021-07-06 ENCOUNTER — Encounter: Payer: 59 | Admitting: Physician Assistant

## 2021-07-06 DIAGNOSIS — N3041 Irradiation cystitis with hematuria: Secondary | ICD-10-CM | POA: Diagnosis not present

## 2021-07-06 LAB — GLUCOSE, CAPILLARY
Glucose-Capillary: 278 mg/dL — ABNORMAL HIGH (ref 70–99)
Glucose-Capillary: 119 mg/dL — ABNORMAL HIGH (ref 70–99)

## 2021-07-06 NOTE — Progress Notes (Signed)
PENIEL, HASS ([id number]) ?Visit Report for 07/06/2021 ?Arrival Information Details ?Patient Name: Dennis Hicks, Dennis Hicks ?Date of Service: 07/06/2021 8:00 AM ?Medical Record Number: [id number] ?Patient Account Number: [id number] ?Date of Birth/Sex: May 22, 1955 (66 y.o. M) ?Treating RN: Carlene Coria ?Primary Care Rosalie Gelpi: Diona Browner, Amy ?Other Clinician: Jacqulyn Bath ?Referring Geneal Huebert: Diona Browner, Amy ?Treating Linc Renne/Extender: Jeri Cos ?Weeks in Treatment: 1 ?Visit Information History Since Last Visit ?Added or deleted any medications: No ?Patient Arrived: Ambulatory ?Any new allergies or adverse reactions: No ?Arrival Time: 08:00 ?Had a fall or experienced change in No ?Accompanied By: self ?activities of daily living that may affect ?Transfer Assistance: None ?risk of falls: ?Patient Identification Verified: Yes ?Signs or symptoms of abuse/neglect since last visito No ?Secondary Verification Process Completed: Yes ?Hospitalized since last visit: No ?Patient Requires Transmission-Based Precautions: No ?Implantable device outside of the clinic excluding No ?Patient Has Alerts: No ?cellular tissue based products placed in the center ?since last visit: ?Pain Present Now: No ?Electronic Signature(s) ?Signed: 07/06/2021 11:36:59 AM By: Enedina Finner RCP, RRT, CHT ?Entered By: Enedina Finner on 07/06/2021 08:39:01 ?KAYTON, DUNAJ F. ([id number]) ?-------------------------------------------------------------------------------- ?Encounter Discharge Information Details ?Patient Name: Dennis Hicks, Dennis Hicks ?Date of Service: 07/06/2021 8:00 AM ?Medical Record Number: [id number] ?Patient Account Number: [id number] ?Date of Birth/Sex: 05/26/1955 (66 y.o. M) ?Treating RN: Carlene Coria ?Primary Care Brion Hedges: Diona Browner, Amy ?Other Clinician: Jacqulyn Bath ?Referring Mitzi Lilja: Diona Browner, Amy ?Treating Alphonsa Brickle/Extender: Jeri Cos ?Weeks in Treatment: 1 ?Encounter Discharge Information Items ?Discharge Condition:  Stable ?Ambulatory Status: Ambulatory ?Discharge Destination: Home ?Transportation: Private Auto ?Accompanied By: self ?Schedule Follow-up Appointment: Yes ?Clinical Summary of Care: ?Notes ?Patient has an HBO treatment scheduled on 07/07/21 at 08:00 am. ?Electronic Signature(s) ?Signed: 07/06/2021 11:36:59 AM By: Enedina Finner RCP, RRT, CHT ?Entered By: Enedina Finner on 07/06/2021 11:25:39 ?MAC, DOWDELL F. ([id number]) ?-------------------------------------------------------------------------------- ?Vitals Details ?Patient Name: Dennis Hicks, Dennis Hicks ?Date of Service: 07/06/2021 8:00 AM ?Medical Record Number: [id number] ?Patient Account Number: [id number] ?Date of Birth/Sex: 05-07-1955 (66 y.o. M) ?Treating RN: Carlene Coria ?Primary Care Ahmar Pickrell: Diona Browner, Amy ?Other Clinician: Jacqulyn Bath ?Referring Avianna Moynahan: Diona Browner, Amy ?Treating Alaric Gladwin/Extender: Jeri Cos ?Weeks in Treatment: 1 ?Vital Signs ?Time Taken: 08:01 ?Temperature (??F): 98.6 ?Height (in): 68 ?Pulse (bpm): 66 ?Weight (lbs): 185 ?Respiratory Rate (breaths/min): 18 ?Body Mass Index (BMI): 28.1 ?Blood Pressure (mmHg): 114/72 ?Capillary Blood Glucose (mg/dl): 278 ?Reference Range: 80 - 120 mg / dl ?Electronic Signature(s) ?Signed: 07/06/2021 11:36:59 AM By: Enedina Finner RCP, RRT, CHT ?Entered By: Enedina Finner on 07/06/2021 08:39:50 ?

## 2021-07-07 ENCOUNTER — Encounter (HOSPITAL_BASED_OUTPATIENT_CLINIC_OR_DEPARTMENT_OTHER): Payer: 59 | Admitting: Internal Medicine

## 2021-07-07 DIAGNOSIS — N3041 Irradiation cystitis with hematuria: Secondary | ICD-10-CM | POA: Diagnosis not present

## 2021-07-07 DIAGNOSIS — Z8546 Personal history of malignant neoplasm of prostate: Secondary | ICD-10-CM

## 2021-07-07 DIAGNOSIS — L598 Other specified disorders of the skin and subcutaneous tissue related to radiation: Secondary | ICD-10-CM

## 2021-07-07 DIAGNOSIS — Z923 Personal history of irradiation: Secondary | ICD-10-CM

## 2021-07-07 DIAGNOSIS — E1169 Type 2 diabetes mellitus with other specified complication: Secondary | ICD-10-CM

## 2021-07-07 LAB — GLUCOSE, CAPILLARY
Glucose-Capillary: 176 mg/dL — ABNORMAL HIGH (ref 70–99)
Glucose-Capillary: 124 mg/dL — ABNORMAL HIGH (ref 70–99)

## 2021-07-07 NOTE — Progress Notes (Signed)
TYMARION, EVERARD ([id number]) ?Visit Report for 07/06/2021 ?Problem List Details ?Patient Name: Dennis Hicks, Dennis Hicks ?Date of Service: 07/06/2021 8:00 AM ?Medical Record Number: [id number] ?Patient Account Number: [id number] ?Date of Birth/Sex: [birth year] (66 y.o. M) ?Treating RN: Carlene Coria ?Primary Care Provider: Diona Browner, Amy ?Other Clinician: Jacqulyn Bath ?Referring Provider: Diona Browner, Amy ?Treating Provider/Extender: Jeri Cos ?Weeks in Treatment: 1 ?Active Problems ?ICD-10 ?Encounter ?Code Description Active Date MDM ?Diagnosis ?N30.41 Irradiation cystitis with hematuria 06/25/2021 No Yes ?N39.3 Stress incontinence (male) (male) 06/25/2021 No Yes ?Z85.46 Personal history of malignant neoplasm of prostate 06/25/2021 No Yes ?E11.69 Type 2 diabetes mellitus with other specified complication 3/0/8657 No Yes ?Inactive Problems ?Resolved Problems ?Electronic Signature(s) ?Signed: 07/07/2021 8:22:55 AM By: Worthy Keeler PA-C ?Entered By: Worthy Keeler on 07/07/2021 08:22:55 ?MAXIM, BEDEL F. ([id number]) ?-------------------------------------------------------------------------------- ?SuperBill Details ?Patient Name: EXANDER, SHAUL. ?Date of Service: 07/06/2021 ?Medical Record Number: [id number] ?Patient Account Number: [id number] ?Date of Birth/Sex: [birth year] (66 y.o. M) ?Treating RN: Carlene Coria ?Primary Care Provider: Diona Browner, Amy ?Other Clinician: Jacqulyn Bath ?Referring Provider: Diona Browner, Amy ?Treating Provider/Extender: Jeri Cos ?Weeks in Treatment: 1 ?Diagnosis Coding ?ICD-10 Codes ?Code Description ?N30.41 Irradiation cystitis with hematuria ?N39.3 Stress incontinence (male) (male) ?Z85.46 Personal history of malignant neoplasm of prostate ?E11.69 Type 2 diabetes mellitus with other specified complication ?Facility Procedures ?CPT4 Code: 02725366 ?Description: (Facility Use Only) HBOT, full body chamber, 9mn ?Modifier: ?Quantity: 4 ?Physician Procedures ?CPT4 Code: 64403474?Description: 99183 - WC PHYS  HYPERBARIC OXYGEN THERAPY ?Modifier: ?Quantity: 1 ?CPT4 Code: ?Description: ICD-10 Diagnosis Description N30.41 Irradiation cystitis with hematuria Z85.46 Personal history of malignant neoplasm of prostate E11.69 Type 2 diabetes mellitus with other specified complication ?Modifier: ?Quantity: ?Electronic Signature(s) ?Signed: 07/07/2021 8:23:08 AM By: SWorthy KeelerPA-C ?Previous Signature: 07/06/2021 11:36:59 AM Version By: WEnedina FinnerRCP, RRT, CHT ?Previous Signature: 07/06/2021 5:00:17 PM Version By: SWorthy KeelerPA-C ?Entered By: SWorthy Keeleron 07/07/2021 08:23:08 ?

## 2021-07-07 NOTE — Progress Notes (Signed)
CUAUHTEMOC, HUEGEL ([id number]) ?Visit Report for 07/07/2021 ?HBO Details ?Patient Name: Dennis Hicks, Dennis Hicks ?Date of Service: 07/07/2021 8:00 AM ?Medical Record Number: [id number] ?Patient Account Number: [id number] ?Date of Birth/Sex: March 09, 1956 (67 y.o. 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 ?HBO Treatment Details ?Treatment Number: 3 ?Patient Type: Outpatient ?Chamber Type: Monoplace ?Chamber Serial #: [id number] ?Treatment Protocol: 2.5 ATA with 90 minutes oxygen, with two 5 minute air breaks ?Treatment Details ?Compression Rate Down: 1.5 psi / minute ?De-Compression Rate Up: 2.0 psi / minute ?Compress Tx Pressure Air breaks and breathing periods Decompress Decompress ?Begins Reached (leave unused spaces blank) Begins Ends ?Chamber Pressure (ATA) 1 2.5 2.5 2.5 2.5 2.5 - - 2.5 1 ?Clock Time (24 hr) 08:29 08:44 09:14 09:19 09:50 09:55 - - 10:26 10:37 ?Treatment Length: 128 (minutes) ?Treatment Segments: 4 ?Vital Signs ?Capillary Blood Glucose Reference Range: 80 - 120 mg / dl ?HBO Diabetic Blood Glucose Intervention Range: <131 mg/dl or >249 mg/dl ?Time Vitals Blood Respiratory Capillary Blood Glucose Pulse Action ?Type: ?Pulse: Temperature: ?Taken: ?Pressure: ?Rate: ?Glucose (mg/dl): ?Meter #: Oximetry (%) Taken: ?Pre 08:10 118/70 66 18 98.6 176 1 none per protocol ?Post 10:48 122/68 66 18 98.2 124 1 none per protocol ?Treatment Response ?Treatment Toleration: Well ?Treatment Completion ?Treatment Completed without Adverse Event ?Status: ?HBO Attestation ?I certify that I supervised this HBO treatment in accordance with ?Medicare guidelines. A trained  emergency response team is readily Yes ?available per hospital policies and procedures. ?Continue HBOT as ordered. Yes ?Electronic Signature(s) ?Signed: 07/07/2021 4:30:07 PM By: Kalman Shan DO ?Previous Signature: 07/07/2021 2:31:17 PM Version By: Enedina Finner RCP, RRT, CHT ?Entered By: Kalman Shan on 07/07/2021 16:28:19 ?Dennis Hicks, Dennis Hicks ([id number]) ?-------------------------------------------------------------------------------- ?HBO Safety Checklist Details ?Patient Name: Dennis Hicks, Dennis Hicks ?Date of Service: 07/07/2021 8:00 AM ?Medical Record Number: [id number] ?Patient Account Number: [id number] ?Date of Birth/Sex: [birth year] (66 y.o. M) ?Treating RN: Carlene Coria ?Primary Care Vidal Lampkins: Diona Browner, Amy ?Other Clinician: Jacqulyn Bath ?Referring Jalea Bronaugh: Diona Browner, Amy ?Treating Suzzette Gasparro/Extender: Kalman Shan ?Weeks in Treatment: 1 ?HBO Safety Checklist Items ?Safety Checklist ?Consent Form Signed ?Patient voided / foley secured and emptied ?When did you last eato 07:00 AM ?Last dose of injectable or oral agent 07:00 am ?Ostomy pouch emptied and vented if applicable ?NA ?All implantable devices assessed, documented and approved ?NA ?Intravenous access site secured and place ?NA ?Valuables secured ?Linens and cotton and cotton/polyester blend (less than 51% polyester) ?Personal oil-based products / skin lotions / body lotions removed ?Wigs or hairpieces removed ?NA ?Smoking or tobacco materials removed ?NA ?Books / newspapers / magazines / loose paper removed ?NA ?Cologne, aftershave, perfume and deodorant removed ?Jewelry removed (may wrap wedding band) ?Make-up removed ?NA ?Hair care products removed ?Battery operated devices (external) removed ?NA ?Heating patches and chemical warmers removed ?NA ?Titanium eyewear removed ?NA ?Nail polish cured greater than 10 hours ?NA ?Casting material cured greater than 10 hours ?NA ?Hearing aids removed ?NA ?Loose dentures or partials  removed ?NA ?Prosthetics have been removed ?NA ?Patient demonstrates correct use of air break device (if applicable) ?Patient concerns have been addressed ?Patient grounding bracelet on and cord attached to chamber ?Specifics for Inpatients (complete in addition to ?above) ?Medication sheet sent with patient ?Intravenous medications needed or  due during therapy sent with patient ?Drainage tubes (e.g. nasogastric tube or chest tube secured and vented) ?Endotracheal or Tracheotomy tube secured ?Cuff deflated of air and inflated with saline ?Airway suctioned ?Electronic Signature(s) ?Signed: 07/07/2021 2:31:17 PM By: Enedina Finner RCP, RRT, CHT ?Entered By: Enedina Finner on 07/07/2021 09:38:02 ?

## 2021-07-07 NOTE — Progress Notes (Signed)
Dennis Hicks, Dennis Hicks ([id number]) ?Visit Report for 07/07/2021 ?Arrival Information Details ?Patient Name: Dennis Hicks, Dennis Hicks ?Date of Service: 07/07/2021 8:00 AM ?Medical Record Number: [id number] ?Patient Account Number: [id number] ?Date of Birth/Sex: 08-03-1955 (66 y.o. M) ?Treating RN: Carlene Coria ?Primary Care Dashaun Onstott: Diona Browner, Amy ?Other Clinician: Jacqulyn Bath ?Referring Loralie Malta: Diona Browner, Amy ?Treating Tarae Wooden/Extender: Kalman Shan ?Weeks in Treatment: 1 ?Visit Information History Since Last Visit ?Added or deleted any medications: No ?Patient Arrived: Ambulatory ?Any new allergies or adverse reactions: No ?Arrival Time: 08:00 ?Had a fall or experienced change in No ?Accompanied By: self ?activities of daily living that may affect ?Transfer Assistance: None ?risk of falls: ?Patient Identification Verified: Yes ?Signs or symptoms of abuse/neglect since last visito No ?Secondary Verification Process Completed: Yes ?Hospitalized since last visit: No ?Patient Requires Transmission-Based Precautions: No ?Implantable device outside of the clinic excluding No ?Patient Has Alerts: No ?cellular tissue based products placed in the center ?since last visit: ?Pain Present Now: No ?Electronic Signature(s) ?Signed: 07/07/2021 2:31:17 PM By: Enedina Finner RCP, RRT, CHT ?Entered By: Enedina Finner on 07/07/2021 09:23:39 ?Dennis Hicks, Dennis F. ([id number]) ?-------------------------------------------------------------------------------- ?Encounter Discharge Information Details ?Patient Name: Dennis Hicks, Dennis Hicks ?Date of Service: 07/07/2021 8:00 AM ?Medical Record Number: [id number] ?Patient Account Number: [id number] ?Date of Birth/Sex: August 27, 1955 (66 y.o. M) ?Treating RN: Carlene Coria ?Primary Care Ahmiya Abee: Diona Browner, Amy ?Other Clinician: Jacqulyn Bath ?Referring Toure Edmonds: Diona Browner, Amy ?Treating Hadley Detloff/Extender: Kalman Shan ?Weeks in Treatment: 1 ?Encounter Discharge Information Items ?Discharge Condition:  Stable ?Ambulatory Status: Ambulatory ?Discharge Destination: Home ?Transportation: Private Auto ?Accompanied By: self ?Schedule Follow-up Appointment: Yes ?Clinical Summary of Care: ?Notes ?Patient has an HBO treatment scheduled on 07/08/21 at 08:00 am. ?Electronic Signature(s) ?Signed: 07/07/2021 2:31:17 PM By: Enedina Finner RCP, RRT, CHT ?Entered By: Enedina Finner on 07/07/2021 11:35:55 ?Dennis Hicks, Dennis F. ([id number]) ?-------------------------------------------------------------------------------- ?Vitals Details ?Patient Name: Dennis Hicks, Dennis Hicks ?Date of Service: 07/07/2021 8:00 AM ?Medical Record Number: [id number] ?Patient Account Number: [id number] ?Date of Birth/Sex: 04-May-1955 (66 y.o. M) ?Treating RN: Carlene Coria ?Primary Care Nuvia Hileman: Diona Browner, Amy ?Other Clinician: Jacqulyn Bath ?Referring Miniya Miguez: Diona Browner, Amy ?Treating Jaylynn Mcaleer/Extender: Kalman Shan ?Weeks in Treatment: 1 ?Vital Signs ?Time Taken: 08:10 ?Temperature (??F): 98.6 ?Height (in): 68 ?Pulse (bpm): 66 ?Weight (lbs): 185 ?Respiratory Rate (breaths/min): 18 ?Body Mass Index (BMI): 28.1 ?Blood Pressure (mmHg): 118/70 ?Capillary Blood Glucose (mg/dl): 176 ?Reference Range: 80 - 120 mg / dl ?Electronic Signature(s) ?Signed: 07/07/2021 2:31:17 PM By: Enedina Finner RCP, RRT, CHT ?Entered By: Enedina Finner on 07/07/2021 09:26:43 ?

## 2021-07-07 NOTE — Progress Notes (Addendum)
ROBERT, SPERL ([id number]) ?Visit Report for 07/06/2021 ?HBO Details ?Patient Name: Dennis Hicks, FISHBAUGH ?Date of Service: 07/06/2021 8:00 AM ?Medical Record Number: [id number] ?Patient Account Number: [id number] ?Date of Birth/Sex: 25-Apr-1955 (66 y.o. 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 ?HBO Treatment Details ?Treatment Number: 2 ?Patient Type: Outpatient ?Chamber Type: Monoplace ?Chamber Serial #: [id number] ?Treatment Protocol: 2.5 ATA with 90 minutes oxygen, with two 5 minute air breaks ?Treatment Details ?Compression Rate Down: 1.5 psi / minute ?De-Compression Rate Up: 1.5 psi / minute ?Compress Tx Pressure Air breaks and breathing periods Decompress Decompress ?Begins Reached (leave unused spaces blank) Begins Ends ?Chamber Pressure (ATA) 1 2.5 2.5 2.5 2.5 2.5 - - 2.5 1 ?Clock Time (24 hr) 08:21 08:36 09:06 09:11 09:42 09:47 - - 10:17 10:32 ?Treatment Length: 131 (minutes) ?Treatment Segments: 4 ?Vital Signs ?Capillary Blood Glucose Reference Range: 80 - 120 mg / dl ?HBO Diabetic Blood Glucose Intervention Range: <131 mg/dl or >249 mg/dl ?Time Vitals Blood Respiratory Capillary Blood Glucose Pulse Action ?Type: ?Pulse: Temperature: ?Taken: ?Pressure: ?Rate: ?Glucose (mg/dl): ?Meter #: Oximetry (%) Taken: ?Pre 08:01 114/72 66 18 98.6 278 1 none per protocol ?Post 10:45 119 1 none per protocol ?Treatment Response ?Treatment Toleration: Well ?Treatment Completion ?Treatment Completed without Adverse Event ?Status: ?HBO Attestation ?I certify that I supervised this HBO treatment in accordance with ?Medicare guidelines. A trained emergency response team is  readily Yes ?available per hospital policies and procedures. ?Continue HBOT as ordered. Yes ?Electronic Signature(s) ?Signed: 07/07/2021 8:23:02 AM By: Worthy Keeler PA-C ?Previous Signature: 07/06/2021 11:36:59 AM Version By: Enedina Finner RCP, RRT, CHT ?Previous Signature: 07/06/2021 5:00:17 PM Version By: Worthy Keeler PA-C ?Entered By: Worthy Keeler on 07/07/2021 08:23:02 ?MAREO, PORTILLA ([id number]) ?-------------------------------------------------------------------------------- ?HBO Safety Checklist Details ?Patient Name: PANAYIOTIS, Dennis Hicks ?Date of Service: 07/06/2021 8:00 AM ?Medical Record Number: [id number] ?Patient Account Number: [id number] ?Date of Birth/Sex: 1955/04/19 (66 y.o. M) ?Treating RN: Carlene Coria ?Primary Care Britt Petroni: Diona Browner, Amy ?Other Clinician: Jacqulyn Bath ?Referring Laden Fieldhouse: Diona Browner, Amy ?Treating Shanterica Biehler/Extender: Jeri Cos ?Weeks in Treatment: 1 ?HBO Safety Checklist Items ?Safety Checklist ?Consent Form Signed ?Patient voided / foley secured and emptied ?When did you last eato 07:00 am ?Last dose of injectable or oral agent 07:00 am (metformin) ?Ostomy pouch emptied and vented if applicable ?NA ?All implantable devices assessed, documented and approved ?NA ?Intravenous access site secured and place ?NA ?Valuables secured ?Linens and cotton and cotton/polyester blend (less than 51% polyester) ?Personal oil-based products / skin lotions / body lotions removed ?Wigs or hairpieces removed ?NA ?Smoking or tobacco materials removed ?NA ?Books / newspapers / magazines / loose paper removed ?NA ?Cologne, aftershave, perfume and deodorant removed ?Jewelry removed (may wrap wedding band) ?Make-up removed ?NA ?Hair care products removed ?Battery operated devices (external) removed ?NA ?Heating patches and chemical warmers removed ?NA ?Titanium eyewear removed ?NA ?Nail polish cured greater than 10 hours ?NA ?Casting material cured greater than 10 hours ?NA ?Hearing aids  removed ?NA ?Loose dentures or partials removed ?NA ?Prosthetics have been removed ?NA ?Patient demonstrates correct use of air break device (if applicable) ?Patient concerns have been addressed ?Patient grounding bracelet on and cord attached to chamber ?Specifics for Inpatients (complete in addition to ?  above) ?Medication sheet sent with patient ?Intravenous medications needed or due during therapy sent with patient ?Drainage tubes (e.g. nasogastric tube or chest tube secured and vented) ?Endotracheal or Tracheotomy tube secured ?Cuff deflated of air and inflated with saline ?Airway suctioned ?Electronic Signature(s) ?Signed: 07/06/2021 11:36:59 AM By: Enedina Finner RCP, RRT, CHT ?Entered By: Enedina Finner on 07/06/2021 08:41:11 ?

## 2021-07-08 ENCOUNTER — Encounter: Payer: 59 | Admitting: Physician Assistant

## 2021-07-08 DIAGNOSIS — N3041 Irradiation cystitis with hematuria: Secondary | ICD-10-CM | POA: Diagnosis not present

## 2021-07-08 LAB — GLUCOSE, CAPILLARY
Glucose-Capillary: 182 mg/dL — ABNORMAL HIGH (ref 70–99)
Glucose-Capillary: 105 mg/dL — ABNORMAL HIGH (ref 70–99)

## 2021-07-08 NOTE — Progress Notes (Signed)
ATARI, NOVICK ([id number]) ?Visit Report for 07/08/2021 ?Arrival Information Details ?Patient Name: Dennis Hicks, Dennis Hicks ?Date of Service: 07/08/2021 8:00 AM ?Medical Record Number: [id number] ?Patient Account Number: [id number] ?Date of Birth/Sex: 1956-01-31 (66 y.o. M) ?Treating RN: Carlene Coria ?Primary Care Necole Minassian: Diona Browner, Amy ?Other Clinician: Jacqulyn Bath ?Referring Rashidi Loh: Diona Browner, Amy ?Treating Eleesha Purkey/Extender: Jeri Cos ?Weeks in Treatment: 1 ?Visit Information History Since Last Visit ?Added or deleted any medications: No ?Patient Arrived: Ambulatory ?Any new allergies or adverse reactions: No ?Arrival Time: 08:00 ?Had a fall or experienced change in No ?Accompanied By: self ?activities of daily living that may affect ?Transfer Assistance: None ?risk of falls: ?Patient Identification Verified: Yes ?Signs or symptoms of abuse/neglect since last visito No ?Secondary Verification Process Completed: Yes ?Hospitalized since last visit: No ?Patient Requires Transmission-Based Precautions: No ?Implantable device outside of the clinic excluding No ?Patient Has Alerts: No ?cellular tissue based products placed in the center ?since last visit: ?Pain Present Now: No ?Electronic Signature(s) ?Signed: 07/08/2021 1:35:01 PM By: Enedina Finner RCP, RRT, CHT ?Entered By: Enedina Finner on 07/08/2021 08:33:19 ?ILARIO, DHALIWAL F. ([id number]) ?-------------------------------------------------------------------------------- ?Encounter Discharge Information Details ?Patient Name: Dennis Hicks ?Date of Service: 07/08/2021 8:00 AM ?Medical Record Number: [id number] ?Patient Account Number: [id number] ?Date of Birth/Sex: 1955-09-07 (66 y.o. M) ?Treating RN: Carlene Coria ?Primary Care Adian Jablonowski: Diona Browner, Amy ?Other Clinician: Jacqulyn Bath ?Referring Torunn Chancellor: Diona Browner, Amy ?Treating Terren Jandreau/Extender: Jeri Cos ?Weeks in Treatment: 1 ?Encounter Discharge Information Items ?Discharge Condition: Stable ?Ambulatory  Status: Ambulatory ?Discharge Destination: Home ?Transportation: Private Auto ?Accompanied By: self ?Schedule Follow-up Appointment: Yes ?Clinical Summary of Care: ?Notes ?Patient has an HBO treatment scheduled on 07/09/21 at 08:00 am. ?Electronic Signature(s) ?Signed: 07/08/2021 1:35:01 PM By: Enedina Finner RCP, RRT, CHT ?Entered By: Enedina Finner on 07/08/2021 10:59:09 ?THIMOTHY, BARRETTA F. ([id number]) ?-------------------------------------------------------------------------------- ?Vitals Details ?Patient Name: Dennis Hicks ?Date of Service: 07/08/2021 8:00 AM ?Medical Record Number: [id number] ?Patient Account Number: [id number] ?Date of Birth/Sex: [birth year] (66 y.o. M) ?Treating RN: Carlene Coria ?Primary Care Prisca Gearing: Diona Browner, Amy ?Other Clinician: Jacqulyn Bath ?Referring Tyrena Gohr: Diona Browner, Amy ?Treating Tim Wilhide/Extender: Jeri Cos ?Weeks in Treatment: 1 ?Vital Signs ?Time Taken: 08:04 ?Temperature (??F): 98.6 ?Height (in): 68 ?Pulse (bpm): 78 ?Weight (lbs): 185 ?Respiratory Rate (breaths/min): 18 ?Body Mass Index (BMI): 28.1 ?Blood Pressure (mmHg): 122/66 ?Capillary Blood Glucose (mg/dl): 182 ?Reference Range: 80 - 120 mg / dl ?Electronic Signature(s) ?Signed: 07/08/2021 1:35:01 PM By: Enedina Finner RCP, RRT, CHT ?Entered By: Enedina Finner on 07/08/2021 08:34:33 ?

## 2021-07-09 ENCOUNTER — Encounter: Payer: 59 | Admitting: Physician Assistant

## 2021-07-09 DIAGNOSIS — N3041 Irradiation cystitis with hematuria: Secondary | ICD-10-CM | POA: Diagnosis not present

## 2021-07-09 LAB — GLUCOSE, CAPILLARY
Glucose-Capillary: 182 mg/dL — ABNORMAL HIGH (ref 70–99)
Glucose-Capillary: 110 mg/dL — ABNORMAL HIGH (ref 70–99)

## 2021-07-09 NOTE — Progress Notes (Signed)
Dennis, Hicks ([id number]) ?Visit Report for 07/08/2021 ?HBO Details ?Patient Name: Dennis Hicks, Dennis Hicks ?Date of Service: 07/08/2021 8:00 AM ?Medical Record Number: [id number] ?Patient Account Number: [id number] ?Date of Birth/Sex: 1955-10-26 (66 y.o. 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 ?HBO Treatment Details ?Treatment Number: 4 ?Patient Type: Outpatient ?Chamber Type: Monoplace ?Chamber Serial #: [id number] ?Treatment Protocol: 2.5 ATA with 90 minutes oxygen, with two 5 minute air breaks ?Treatment Details ?Compression Rate Down: 1.5 psi / minute ?De-Compression Rate Up: 2.0 psi / minute ?Compress Tx Pressure Air breaks and breathing periods Decompress Decompress ?Begins Reached (leave unused spaces blank) Begins Ends ?Chamber Pressure (ATA) 1 2.5 2.5 2.5 2.5 2.5 - - 2.5 1 ?Clock Time (24 hr) 08:19 08:34 09:04 09:09 09:39 09:44 - - 10:14 10:22 ?Treatment Length: 123 (minutes) ?Treatment Segments: 4 ?Vital Signs ?Capillary Blood Glucose Reference Range: 80 - 120 mg / dl ?HBO Diabetic Blood Glucose Intervention Range: <131 mg/dl or >249 mg/dl ?Time Vitals Blood Respiratory Capillary Blood Glucose Pulse Action ?Type: ?Pulse: Temperature: ?Taken: ?Pressure: ?Rate: ?Glucose (mg/dl): ?Meter #: Oximetry (%) Taken: ?Pre 08:04 122/66 78 18 98.6 182 1 none per protocol ?Post 10:38 110/72 60 18 98 105 1 none per protocol ?Treatment Response ?Treatment Toleration: Well ?Treatment Completion ?Treatment Completed without Adverse Event ?Status: ?HBO Attestation ?I certify that I supervised this HBO treatment in accordance with ?Medicare guidelines. A trained emergency  response team is readily Yes ?available per hospital policies and procedures. ?Continue HBOT as ordered. Yes ?Electronic Signature(s) ?Signed: 07/09/2021 5:27:40 PM By: Worthy Keeler PA-C ?Previous Signature: 07/08/2021 1:35:01 PM Version By: Enedina Finner RCP, RRT, CHT ?Entered By: Worthy Keeler on 07/09/2021 17:27:40 ?CHANZ, CAHALL ([id number]) ?-------------------------------------------------------------------------------- ?HBO Safety Checklist Details ?Patient Name: Dennis, Hicks ?Date of Service: 07/08/2021 8:00 AM ?Medical Record Number: [id number] ?Patient Account Number: [id number] ?Date of Birth/Sex: 21-Dec-1955 (66 y.o. M) ?Treating RN: Carlene Coria ?Primary Care Stiven Kaspar: Diona Browner, Amy ?Other Clinician: Jacqulyn Bath ?Referring Italia Wolfert: Diona Browner, Amy ?Treating Reine Bristow/Extender: Jeri Cos ?Weeks in Treatment: 1 ?HBO Safety Checklist Items ?Safety Checklist ?Consent Form Signed ?Patient voided / foley secured and emptied ?When did you last eato 07/07/21 pm ?Last dose of injectable or oral agent 7:00 am ?Ostomy pouch emptied and vented if applicable ?NA ?All implantable devices assessed, documented and approved ?NA ?Intravenous access site secured and place ?NA ?Valuables secured ?Linens and cotton and cotton/polyester blend (less than 51% polyester) ?Personal oil-based products / skin lotions / body lotions removed ?Wigs or hairpieces removed ?NA ?Smoking or tobacco materials removed ?NA ?Books / newspapers / magazines / loose paper removed ?NA ?Cologne, aftershave, perfume and deodorant removed ?Jewelry removed (may wrap wedding band) ?Make-up removed ?NA ?Hair care products removed ?Battery operated devices (external) removed ?NA ?Heating patches and chemical warmers removed ?NA ?Titanium eyewear removed ?NA ?Nail polish cured greater than 10 hours ?NA ?Casting material cured greater than 10 hours ?NA ?Hearing aids removed ?NA ?Loose dentures or partials removed ?NA ?Prosthetics have been  removed ?NA ?Patient demonstrates correct use of air break device (if applicable) ?Patient concerns have been addressed ?Patient grounding bracelet on and cord attached to chamber ?Specifics for Inpatients (complete in addition to ?above) ?Medication sheet sent with patient ?Intravenous medications  needed or due during therapy sent with patient ?Drainage tubes (e.g. nasogastric tube or chest tube secured and vented) ?Endotracheal or Tracheotomy tube secured ?Cuff deflated of air and inflated with saline ?Airway suctioned ?Electronic Signature(s) ?Signed: 07/08/2021 1:35:01 PM By: Enedina Finner RCP, RRT, CHT ?Entered By: Enedina Finner on 07/08/2021 08:37:40 ?

## 2021-07-09 NOTE — Progress Notes (Signed)
FATIH, STALVEY ([id number]) ?Visit Report for 07/09/2021 ?HBO Details ?Patient Name: Dennis Hicks, Dennis Hicks ?Date of Service: 07/09/2021 8:00 AM ?Medical Record Number: [id number] ?Patient Account Number: [id number] ?Date of Birth/Sex: 1955-04-02 (66 y.o. 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 ?HBO Treatment Details ?Treatment Number: 5 ?Patient Type: Outpatient ?Chamber Type: Monoplace ?Chamber Serial #: [id number] ?Treatment Protocol: 2.5 ATA with 90 minutes oxygen, with two 5 minute air breaks ?Treatment Details ?Compression Rate Down: 1.5 psi / minute ?De-Compression Rate Up: 2.0 psi / minute ?Compress Tx Pressure Air breaks and breathing periods Decompress Decompress ?Begins Reached (leave unused spaces blank) Begins Ends ?Chamber Pressure (ATA) 1 2.5 2.5 2.5 2.5 2.5 - - 2.5 1 ?Clock Time (24 hr) 08:15 08:31 09:01 09:06 09:37 09:42 - - 10:12 10:26 ?Treatment Length: 131 (minutes) ?Treatment Segments: 4 ?Vital Signs ?Capillary Blood Glucose Reference Range: 80 - 120 mg / dl ?HBO Diabetic Blood Glucose Intervention Range: <131 mg/dl or >249 mg/dl ?Time Vitals Blood Respiratory Capillary Blood Glucose Pulse Action ?Type: ?Pulse: Temperature: ?Taken: ?Pressure: ?Rate: ?Glucose (mg/dl): ?Meter #: Oximetry (%) Taken: ?Pre 08:01 112/74 60 18 98.9 182 1 none per protocol ?Post 10:37 112/70 72 18 97.8 110 1 none per protocol ?Treatment Response ?Treatment Toleration: Well ?Treatment Completion ?Treatment Completed without Adverse Event ?Status: ?HBO Attestation ?I certify that I supervised this HBO treatment in accordance with ?Medicare guidelines. A trained emergency  response team is readily Yes ?available per hospital policies and procedures. ?Continue HBOT as ordered. Yes ?Electronic Signature(s) ?Signed: 07/09/2021 5:29:49 PM By: Worthy Keeler PA-C ?Previous Signature: 07/09/2021 11:50:31 AM Version By: Enedina Finner RCP, RRT, CHT ?Entered By: Worthy Keeler on 07/09/2021 17:29:48 ?Dennis Hicks, Dennis Hicks ([id number]) ?-------------------------------------------------------------------------------- ?HBO Safety Checklist Details ?Patient Name: Dennis Hicks, Dennis Hicks ?Date of Service: 07/09/2021 8:00 AM ?Medical Record Number: [id number] ?Patient Account Number: [id number] ?Date of Birth/Sex: 10-08-1955 (66 y.o. M) ?Treating RN: Carlene Coria ?Primary Care Cavin Longman: Diona Browner, Amy ?Other Clinician: Jacqulyn Bath ?Referring Hiawatha Merriott: Diona Browner, Amy ?Treating Alyssha Housh/Extender: Jeri Cos ?Weeks in Treatment: 2 ?HBO Safety Checklist Items ?Safety Checklist ?Consent Form Signed ?Patient voided / foley secured and emptied ?When did you last eato 4/20/pm ?Last dose of injectable or oral agent am ?Ostomy pouch emptied and vented if applicable ?NA ?All implantable devices assessed, documented and approved ?NA ?Intravenous access site secured and place ?NA ?Valuables secured ?Linens and cotton and cotton/polyester blend (less than 51% polyester) ?Personal oil-based products / skin lotions / body lotions removed ?Wigs or hairpieces removed ?NA ?Smoking or tobacco materials removed ?NA ?Books / newspapers / magazines / loose paper removed ?NA ?Cologne, aftershave, perfume and deodorant removed ?Jewelry removed (may wrap wedding band) ?Make-up removed ?NA ?Hair care products removed ?Battery operated devices (external) removed ?NA ?Heating patches and chemical warmers removed ?NA ?Titanium eyewear removed ?NA ?Nail polish cured greater than 10 hours ?NA ?Casting material cured greater than 10 hours ?NA ?Hearing aids removed ?NA ?Loose dentures or partials removed ?NA ?Prosthetics have been  removed ?NA ?Patient demonstrates correct use of air break device (if applicable) ?Patient concerns have been addressed ?Patient grounding bracelet on and cord attached to chamber ?Specifics for Inpatients (complete in addition to ?above) ?Medication sheet sent with patient ?Intravenous medications needed or  due during therapy sent with patient ?Drainage tubes (e.g. nasogastric tube or chest tube secured and vented) ?Endotracheal or Tracheotomy tube secured ?Cuff deflated of air and inflated with saline ?Airway suctioned ?Electronic Signature(s) ?Signed: 07/09/2021 11:50:31 AM By: Enedina Finner RCP, RRT, CHT ?Entered By: Enedina Finner on 07/09/2021 08:25:59 ?

## 2021-07-09 NOTE — Progress Notes (Signed)
FODAY, CONE ([id number]) ?Visit Report for 07/09/2021 ?Problem List Details ?Patient Name: Dennis Hicks, Dennis Hicks ?Date of Service: 07/09/2021 8:00 AM ?Medical Record Number: [id number] ?Patient Account Number: [id number] ?Date of Birth/Sex: [birth year] (66 y.o. M) ?Treating RN: Carlene Coria ?Primary Care Provider: Diona Browner, Amy ?Other Clinician: Jacqulyn Bath ?Referring Provider: Diona Browner, Amy ?Treating Provider/Extender: Jeri Cos ?Weeks in Treatment: 2 ?Active Problems ?ICD-10 ?Encounter ?Code Description Active Date MDM ?Diagnosis ?N30.41 Irradiation cystitis with hematuria 06/25/2021 No Yes ?N39.3 Stress incontinence (male) (male) 06/25/2021 No Yes ?Z85.46 Personal history of malignant neoplasm of prostate 06/25/2021 No Yes ?E11.69 Type 2 diabetes mellitus with other specified complication 08/23/4648 No Yes ?Inactive Problems ?Resolved Problems ?Electronic Signature(s) ?Signed: 07/09/2021 5:29:42 PM By: Worthy Keeler PA-C ?Entered By: Worthy Keeler on 07/09/2021 17:29:42 ?Dennis Hicks, Dennis F. ([id number]) ?-------------------------------------------------------------------------------- ?SuperBill Details ?Patient Name: Dennis Hicks, Dennis Hicks. ?Date of Service: 07/09/2021 ?Medical Record Number: [id number] ?Patient Account Number: [id number] ?Date of Birth/Sex: Jul 22, 1955 (66 y.o. M) ?Treating RN: Carlene Coria ?Primary Care Provider: Diona Browner, Amy ?Other Clinician: Jacqulyn Bath ?Referring Provider: Diona Browner, Amy ?Treating Provider/Extender: Jeri Cos ?Weeks in Treatment: 2 ?Diagnosis Coding ?ICD-10 Codes ?Code Description ?N30.41 Irradiation cystitis with hematuria ?N39.3 Stress incontinence (male) (male) ?Z85.46 Personal history of malignant neoplasm of prostate ?E11.69 Type 2 diabetes mellitus with other specified complication ?Facility Procedures ?CPT4 Code: 94496759 ?Description: (Facility Use Only) HBOT, full body chamber, 28mn ?Modifier: ?Quantity: 4 ?Physician Procedures ?CPT4 Code: 61638466?Description: 99183 - WC PHYS  HYPERBARIC OXYGEN THERAPY ?Modifier: ?Quantity: 1 ?CPT4 Code: ?Description: ICD-10 Diagnosis Description N30.41 Irradiation cystitis with hematuria Z85.46 Personal history of malignant neoplasm of prostate E11.69 Type 2 diabetes mellitus with other specified complication ?Modifier: ?Quantity: ?Electronic Signature(s) ?Signed: 07/09/2021 5:29:54 PM By: SWorthy KeelerPA-C ?Previous Signature: 07/09/2021 11:50:31 AM Version By: WEnedina FinnerRCP, RRT, CHT ?Entered By: SWorthy Keeleron 07/09/2021 17:29:53 ?

## 2021-07-09 NOTE — Progress Notes (Signed)
SAFWAN, TOMEI ([id number]) ?Visit Report for 07/08/2021 ?Problem List Details ?Patient Name: Dennis Hicks, Dennis Hicks ?Date of Service: 07/08/2021 8:00 AM ?Medical Record Number: [id number] ?Patient Account Number: [id number] ?Date of Birth/Sex: [birth year] (66 y.o. M) ?Treating RN: Carlene Coria ?Primary Care Provider: Diona Browner, Amy ?Other Clinician: Jacqulyn Bath ?Referring Provider: Diona Browner, Amy ?Treating Provider/Extender: Jeri Cos ?Weeks in Treatment: 1 ?Active Problems ?ICD-10 ?Encounter ?Code Description Active Date MDM ?Diagnosis ?N30.41 Irradiation cystitis with hematuria 06/25/2021 No Yes ?N39.3 Stress incontinence (male) (male) 06/25/2021 No Yes ?Z85.46 Personal history of malignant neoplasm of prostate 06/25/2021 No Yes ?E11.69 Type 2 diabetes mellitus with other specified complication 11/25/6732 No Yes ?Inactive Problems ?Resolved Problems ?Electronic Signature(s) ?Signed: 07/09/2021 5:27:32 PM By: Worthy Keeler PA-C ?Entered By: Worthy Keeler on 07/09/2021 17:27:31 ?Dennis Hicks, Dennis F. ([id number]) ?-------------------------------------------------------------------------------- ?SuperBill Details ?Patient Name: Dennis Hicks, Dennis Hicks. ?Date of Service: 07/08/2021 ?Medical Record Number: [id number] ?Patient Account Number: [id number] ?Date of Birth/Sex: [birth year] (66 y.o. M) ?Treating RN: Carlene Coria ?Primary Care Provider: Diona Browner, Amy ?Other Clinician: Jacqulyn Bath ?Referring Provider: Diona Browner, Amy ?Treating Provider/Extender: Jeri Cos ?Weeks in Treatment: 1 ?Diagnosis Coding ?ICD-10 Codes ?Code Description ?N30.41 Irradiation cystitis with hematuria ?N39.3 Stress incontinence (male) (male) ?Z85.46 Personal history of malignant neoplasm of prostate ?E11.69 Type 2 diabetes mellitus with other specified complication ?Facility Procedures ?CPT4 Code: 42683419 ?Description: (Facility Use Only) HBOT, full body chamber, 47mn ?Modifier: ?Quantity: 4 ?Physician Procedures ?CPT4 Code: 66222979?Description: 99183 - WC PHYS  HYPERBARIC OXYGEN THERAPY ?Modifier: ?Quantity: 1 ?CPT4 Code: ?Description: ICD-10 Diagnosis Description N30.41 Irradiation cystitis with hematuria Z85.46 Personal history of malignant neoplasm of prostate E11.69 Type 2 diabetes mellitus with other specified complication ?Modifier: ?Quantity: ?Electronic Signature(s) ?Signed: 07/09/2021 5:27:46 PM By: SWorthy KeelerPA-C ?Previous Signature: 07/08/2021 1:35:01 PM Version By: WEnedina FinnerRCP, RRT, CHT ?Entered By: SWorthy Keeleron 07/09/2021 17:27:46 ?

## 2021-07-09 NOTE — Progress Notes (Signed)
THESEUS, BIRNIE ([id number]) ?Visit Report for 07/09/2021 ?Arrival Information Details ?Patient Name: Dennis Hicks, Dennis Hicks ?Date of Service: 07/09/2021 8:00 AM ?Medical Record Number: [id number] ?Patient Account Number: [id number] ?Date of Birth/Sex: Jul 27, 1955 (66 y.o. M) ?Treating RN: Carlene Coria ?Primary Care Devrin Monforte: Diona Browner, Amy ?Other Clinician: Jacqulyn Bath ?Referring Eriko Economos: Diona Browner, Amy ?Treating Lore Polka/Extender: Jeri Cos ?Weeks in Treatment: 2 ?Visit Information History Since Last Visit ?Added or deleted any medications: No ?Patient Arrived: Ambulatory ?Any new allergies or adverse reactions: No ?Arrival Time: 08:00 ?Had a fall or experienced change in No ?Accompanied By: self ?activities of daily living that may affect ?Transfer Assistance: None ?risk of falls: ?Patient Identification Verified: Yes ?Signs or symptoms of abuse/neglect since last visito No ?Secondary Verification Process Completed: Yes ?Hospitalized since last visit: No ?Patient Requires Transmission-Based Precautions: No ?Implantable device outside of the clinic excluding No ?Patient Has Alerts: No ?cellular tissue based products placed in the center ?since last visit: ?Has Dressing in Place as Prescribed: Yes ?Pain Present Now: No ?Electronic Signature(s) ?Signed: 07/09/2021 11:50:31 AM By: Enedina Finner RCP, RRT, CHT ?Entered By: Enedina Finner on 07/09/2021 08:24:18 ?MAKAEL, STEIN F. ([id number]) ?-------------------------------------------------------------------------------- ?Encounter Discharge Information Details ?Patient Name: Dennis Hicks ?Date of Service: 07/09/2021 8:00 AM ?Medical Record Number: [id number] ?Patient Account Number: [id number] ?Date of Birth/Sex: [birth year] (66 y.o. M) ?Treating RN: Carlene Coria ?Primary Care Ishmeal Rorie: Diona Browner, Amy ?Other Clinician: Jacqulyn Bath ?Referring Ramces Shomaker: Diona Browner, Amy ?Treating Kamelia Lampkins/Extender: Jeri Cos ?Weeks in Treatment: 2 ?Encounter Discharge Information  Items ?Discharge Condition: Stable ?Ambulatory Status: Ambulatory ?Discharge Destination: Home ?Transportation: Private Auto ?Accompanied By: self ?Schedule Follow-up Appointment: Yes ?Clinical Summary of Care: ?Notes ?Patient has an HBO treatment scheduled on 07/12/21 at 08:00 am. ?Electronic Signature(s) ?Signed: 07/09/2021 11:50:31 AM By: Enedina Finner RCP, RRT, CHT ?Entered By: Enedina Finner on 07/09/2021 11:49:32 ?SAIM, ALMANZA F. ([id number]) ?-------------------------------------------------------------------------------- ?Vitals Details ?Patient Name: Dennis Hicks ?Date of Service: 07/09/2021 8:00 AM ?Medical Record Number: [id number] ?Patient Account Number: [id number] ?Date of Birth/Sex: Jun 25, 1955 (66 y.o. M) ?Treating RN: Carlene Coria ?Primary Care Vesta Wheeland: Diona Browner, Amy ?Other Clinician: Jacqulyn Bath ?Referring Isaias Dowson: Diona Browner, Amy ?Treating Arantza Darrington/Extender: Jeri Cos ?Weeks in Treatment: 2 ?Vital Signs ?Time Taken: 08:01 ?Temperature (??F): 98.9 ?Height (in): 68 ?Pulse (bpm): 60 ?Weight (lbs): 185 ?Respiratory Rate (breaths/min): 18 ?Body Mass Index (BMI): 28.1 ?Blood Pressure (mmHg): 112/74 ?Capillary Blood Glucose (mg/dl): 182 ?Reference Range: 80 - 120 mg / dl ?Electronic Signature(s) ?Signed: 07/09/2021 11:50:31 AM By: Enedina Finner RCP, RRT, CHT ?Entered By: Enedina Finner on 07/09/2021 08:24:52 ?

## 2021-07-12 ENCOUNTER — Encounter: Payer: 59 | Admitting: Physician Assistant

## 2021-07-12 DIAGNOSIS — N3041 Irradiation cystitis with hematuria: Secondary | ICD-10-CM | POA: Diagnosis not present

## 2021-07-12 LAB — GLUCOSE, CAPILLARY
Glucose-Capillary: 185 mg/dL — ABNORMAL HIGH (ref 70–99)
Glucose-Capillary: 112 mg/dL — ABNORMAL HIGH (ref 70–99)

## 2021-07-12 NOTE — Progress Notes (Signed)
Dennis Hicks, Dennis Hicks ([id number]) ?Visit Report for 07/12/2021 ?HBO Details ?Patient Name: Dennis Hicks, Dennis Hicks ?Date of Service: 07/12/2021 8:00 AM ?Medical Record Number: [id number] ?Patient Account Number: [id number] ?Date of Birth/Sex: Dec 10, 1955 (65 y.o. 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 ?HBO Treatment Details ?Treatment Number: 6 ?Patient Type: Outpatient ?Chamber Type: Monoplace ?Chamber Serial #: [id number] ?Treatment Protocol: 2.5 ATA with 90 minutes oxygen, with two 5 minute air breaks ?Treatment Details ?Compression Rate Down: 1.5 psi / minute ?De-Compression Rate Up: 1.5 psi / minute ?Compress Tx Pressure Air breaks and breathing periods Decompress Decompress ?Begins Reached (leave unused spaces blank) Begins Ends ?Chamber Pressure (ATA) 1 2.5 2.5 2.5 2.5 2.5 - - 2.5 1 ?Clock Time (24 hr) 08:13 08:28 08:59 09:04 09:34 09:39 - - 10:10 10:24 ?Treatment Length: 131 (minutes) ?Treatment Segments: 4 ?Vital Signs ?Capillary Blood Glucose Reference Range: 80 - 120 mg / dl ?HBO Diabetic Blood Glucose Intervention Range: <131 mg/dl or >249 mg/dl ?Time Vitals Blood Respiratory Capillary Blood Glucose Pulse Action ?Type: ?Pulse: Temperature: ?Taken: ?Pressure: ?Rate: ?Glucose (mg/dl): ?Meter #: Oximetry (%) Taken: ?Pre 08:01 118/70 72 18 98.8 185 1 none per protocol ?Post 10:34 126/70 60 18 97.7 112 1 none per protocol ?Treatment Response ?Treatment Toleration: Well ?Treatment Completion ?Treatment Completed without Adverse Event ?Status: ?Electronic Signature(s) ?Signed: 07/12/2021 2:00:03 PM By: Enedina Finner RCP, RRT, CHT ?Signed: 07/12/2021 5:11:41 PM  By: Worthy Keeler PA-C ?Entered By: Enedina Finner on 07/12/2021 11:19:53 ?Dennis Hicks, Dennis Hicks ([id number]) ?-------------------------------------------------------------------------------- ?HBO Safety Checklist Details ?Patient Name: Dennis Hicks, Dennis Hicks ?Date of Service: 07/12/2021 8:00 AM ?Medical Record Number: [id number] ?Patient Account Number: [id number] ?Date of Birth/Sex: Mar 18, 1956 (66 y.o. M) ?Treating RN: Carlene Coria ?Primary Care Sharada Albornoz: Diona Browner, Amy ?Other Clinician: Jacqulyn Bath ?Referring Norma Montemurro: Diona Browner, Amy ?Treating Jaclynne Baldo/Extender: Jeri Cos ?Weeks in Treatment: 2 ?HBO Safety Checklist Items ?Safety Checklist ?Consent Form Signed ?Patient voided / foley secured and emptied ?When did you last eato am ?Last dose of injectable or oral agent am ?Ostomy pouch emptied and vented if applicable ?NA ?All implantable devices assessed, documented and approved ?NA ?Intravenous access site secured and place ?NA ?Valuables secured ?Linens and cotton and cotton/polyester blend (less than 51% polyester) ?Personal oil-based products / skin lotions / body lotions removed ?Wigs or hairpieces removed ?NA ?Smoking or tobacco materials removed ?NA ?Books / newspapers / magazines / loose paper removed ?NA ?Cologne, aftershave, perfume and deodorant removed ?Jewelry removed (may wrap wedding band) ?Make-up removed ?NA ?Hair care products removed ?Battery operated devices (external) removed ?NA ?Heating patches and chemical warmers removed ?NA ?Titanium eyewear removed ?NA ?Nail polish cured greater than 10 hours ?NA ?Casting material cured greater than 10 hours ?NA ?Hearing aids removed ?NA ?Loose dentures or partials removed ?NA ?Prosthetics have been removed ?NA ?Patient demonstrates correct use of air break device (if applicable) ?Patient concerns have been addressed ?Patient grounding bracelet on and cord attached to chamber ?Specifics for Inpatients (complete in addition to ?above) ?Medication sheet sent  with patient ?Intravenous medications needed or due during therapy sent with patient ?Drainage tubes (e.g. nasogastric tube or chest tube secured and vented) ?Endotracheal or Tracheotomy tube secured ?Cuff deflated of air and inflated with saline ?Airway suctioned ?Electronic Signature(s) ?Signed: 07/12/2021  2:00:03 PM By: Enedina Finner RCP, RRT, CHT ?Entered By: Enedina Finner on 07/12/2021 08:37:08 ?

## 2021-07-12 NOTE — Progress Notes (Signed)
Dennis Hicks, Dennis Hicks ([id number]) ?Visit Report for 07/12/2021 ?Arrival Information Details ?Patient Name: Dennis Hicks, Dennis Hicks ?Date of Service: 07/12/2021 8:00 AM ?Medical Record Number: [id number] ?Patient Account Number: [id number] ?Date of Birth/Sex: 02/18/1956 (66 y.o. M) ?Treating RN: Carlene Coria ?Primary Care Deosha Werden: Diona Browner, Amy ?Other Clinician: Jacqulyn Bath ?Referring Raphael Espe: Diona Browner, Amy ?Treating Lodie Waheed/Extender: Jeri Cos ?Weeks in Treatment: 2 ?Visit Information History Since Last Visit ?Added or deleted any medications: No ?Patient Arrived: Ambulatory ?Any new allergies or adverse reactions: No ?Arrival Time: 07:55 ?Had a fall or experienced change in No ?Accompanied By: self ?activities of daily living that may affect ?Transfer Assistance: None ?risk of falls: ?Patient Identification Verified: Yes ?Signs or symptoms of abuse/neglect since last visito No ?Secondary Verification Process Completed: Yes ?Hospitalized since last visit: No ?Patient Requires Transmission-Based Precautions: No ?Implantable device outside of the clinic excluding No ?Patient Has Alerts: No ?cellular tissue based products placed in the center ?since last visit: ?Pain Present Now: No ?Electronic Signature(s) ?Signed: 07/12/2021 2:00:03 PM By: Enedina Finner RCP, RRT, CHT ?Entered By: Enedina Finner on 07/12/2021 08:29:32 ?Dennis Hicks, Dennis F. ([id number]) ?-------------------------------------------------------------------------------- ?Encounter Discharge Information Details ?Patient Name: Dennis Hicks, Dennis Hicks ?Date of Service: 07/12/2021 8:00 AM ?Medical Record Number: [id number] ?Patient Account Number: [id number] ?Date of Birth/Sex: 18-May-1955 (66 y.o. M) ?Treating RN: Carlene Coria ?Primary Care Jaiya Mooradian: Diona Browner, Amy ?Other Clinician: Jacqulyn Bath ?Referring Carolynn Tuley: Diona Browner, Amy ?Treating Canuto Kingston/Extender: Jeri Cos ?Weeks in Treatment: 2 ?Encounter Discharge Information Items ?Discharge Condition: Stable ?Ambulatory  Status: Ambulatory ?Discharge Destination: Home ?Transportation: Private Auto ?Accompanied By: self ?Schedule Follow-up Appointment: Yes ?Clinical Summary of Care: ?Notes ?Patient has an HBO treatment scheduled on 07/13/21 at 08:00 am. ?Electronic Signature(s) ?Signed: 07/12/2021 2:00:03 PM By: Enedina Finner RCP, RRT, CHT ?Entered By: Enedina Finner on 07/12/2021 11:22:15 ?Dennis Hicks, Dennis F. ([id number]) ?-------------------------------------------------------------------------------- ?Vitals Details ?Patient Name: Dennis Hicks, Dennis Hicks ?Date of Service: 07/12/2021 8:00 AM ?Medical Record Number: [id number] ?Patient Account Number: [id number] ?Date of Birth/Sex: 1955-09-04 (66 y.o. M) ?Treating RN: Carlene Coria ?Primary Care Emika Tiano: Diona Browner, Amy ?Other Clinician: Jacqulyn Bath ?Referring Kayliee Atienza: Diona Browner, Amy ?Treating Kingsley Farace/Extender: Jeri Cos ?Weeks in Treatment: 2 ?Vital Signs ?Time Taken: 08:01 ?Temperature (??F): 98.8 ?Height (in): 68 ?Pulse (bpm): 72 ?Weight (lbs): 185 ?Respiratory Rate (breaths/min): 18 ?Body Mass Index (BMI): 28.1 ?Blood Pressure (mmHg): 118/70 ?Capillary Blood Glucose (mg/dl): 185 ?Reference Range: 80 - 120 mg / dl ?Electronic Signature(s) ?Signed: 07/12/2021 2:00:03 PM By: Enedina Finner RCP, RRT, CHT ?Entered By: Enedina Finner on 07/12/2021 08:33:41 ?

## 2021-07-13 ENCOUNTER — Encounter: Payer: 59 | Admitting: Physician Assistant

## 2021-07-13 DIAGNOSIS — N3041 Irradiation cystitis with hematuria: Secondary | ICD-10-CM | POA: Diagnosis not present

## 2021-07-13 LAB — GLUCOSE, CAPILLARY
Glucose-Capillary: 173 mg/dL — ABNORMAL HIGH (ref 70–99)
Glucose-Capillary: 129 mg/dL — ABNORMAL HIGH (ref 70–99)

## 2021-07-14 ENCOUNTER — Encounter (HOSPITAL_BASED_OUTPATIENT_CLINIC_OR_DEPARTMENT_OTHER): Payer: 59 | Admitting: Internal Medicine

## 2021-07-14 DIAGNOSIS — N3041 Irradiation cystitis with hematuria: Secondary | ICD-10-CM

## 2021-07-14 DIAGNOSIS — E1169 Type 2 diabetes mellitus with other specified complication: Secondary | ICD-10-CM | POA: Diagnosis not present

## 2021-07-14 DIAGNOSIS — Z8546 Personal history of malignant neoplasm of prostate: Secondary | ICD-10-CM

## 2021-07-14 LAB — GLUCOSE, CAPILLARY
Glucose-Capillary: 173 mg/dL — ABNORMAL HIGH (ref 70–99)
Glucose-Capillary: 113 mg/dL — ABNORMAL HIGH (ref 70–99)

## 2021-07-14 NOTE — Progress Notes (Signed)
KYIAN, OBST ([id number]) ?Visit Report for 07/13/2021 ?Arrival Information Details ?Patient Name: Dennis Hicks, Dennis Hicks ?Date of Service: 07/13/2021 8:00 AM ?Medical Record Number: [id number] ?Patient Account Number: [id number] ?Date of Birth/Sex: May 20, 1955 (66 y.o. M) ?Treating RN: Carlene Coria ?Primary Care Khalea Ventura: Diona Browner, Amy ?Other Clinician: Jacqulyn Bath ?Referring Pattye Meda: Diona Browner, Amy ?Treating Deshae Dickison/Extender: Jeri Cos ?Weeks in Treatment: 2 ?Visit Information History Since Last Visit ?Added or deleted any medications: No ?Patient Arrived: Ambulatory ?Any new allergies or adverse reactions: No ?Arrival Time: 07:54 ?Had a fall or experienced change in No ?Accompanied By: self ?activities of daily living that may affect ?Transfer Assistance: None ?risk of falls: ?Patient Identification Verified: Yes ?Signs or symptoms of abuse/neglect since last visito No ?Secondary Verification Process Completed: Yes ?Hospitalized since last visit: No ?Patient Requires Transmission-Based Precautions: No ?Implantable device outside of the clinic excluding No ?Patient Has Alerts: No ?cellular tissue based products placed in the center ?since last visit: ?Pain Present Now: No ?Electronic Signature(s) ?Signed: 07/13/2021 2:10:43 PM By: Enedina Finner RCP, RRT, CHT ?Entered By: Enedina Finner on 07/13/2021 08:17:13 ?Dennis Hicks, Dennis F. ([id number]) ?-------------------------------------------------------------------------------- ?Encounter Discharge Information Details ?Patient Name: Dennis Hicks, Dennis Hicks ?Date of Service: 07/13/2021 8:00 AM ?Medical Record Number: [id number] ?Patient Account Number: [id number] ?Date of Birth/Sex: 1955/09/01 (66 y.o. M) ?Treating RN: Carlene Coria ?Primary Care Mitsuru Dault: Diona Browner, Amy ?Other Clinician: Jacqulyn Bath ?Referring Raisha Brabender: Diona Browner, Amy ?Treating Yaviel Kloster/Extender: Jeri Cos ?Weeks in Treatment: 2 ?Encounter Discharge Information Items ?Discharge Condition: Stable ?Ambulatory  Status: Ambulatory ?Discharge Destination: Home ?Transportation: Private Auto ?Accompanied By: self ?Schedule Follow-up Appointment: Yes ?Clinical Summary of Care: ?Notes ?Patient has an HBO treatment scheduled on 07/14/21 at 08:00 am. ?Electronic Signature(s) ?Signed: 07/13/2021 2:10:43 PM By: Enedina Finner RCP, RRT, CHT ?Entered By: Enedina Finner on 07/13/2021 12:38:58 ?Dennis Hicks, Dennis F. ([id number]) ?-------------------------------------------------------------------------------- ?Vitals Details ?Patient Name: Dennis Hicks, Dennis Hicks ?Date of Service: 07/13/2021 8:00 AM ?Medical Record Number: [id number] ?Patient Account Number: [id number] ?Date of Birth/Sex: 30-Jul-1955 (66 y.o. M) ?Treating RN: Carlene Coria ?Primary Care Mykal Batiz: Diona Browner, Amy ?Other Clinician: Jacqulyn Bath ?Referring Marlissa Emerick: Diona Browner, Amy ?Treating Markos Theil/Extender: Jeri Cos ?Weeks in Treatment: 2 ?Vital Signs ?Time Taken: 08:03 ?Temperature (??F): 98.8 ?Height (in): 68 ?Pulse (bpm): 90 ?Weight (lbs): 185 ?Respiratory Rate (breaths/min): 18 ?Body Mass Index (BMI): 28.1 ?Blood Pressure (mmHg): 112/66 ?Capillary Blood Glucose (mg/dl): 173 ?Reference Range: 80 - 120 mg / dl ?Electronic Signature(s) ?Signed: 07/13/2021 2:10:43 PM By: Enedina Finner RCP, RRT, CHT ?Entered By: Enedina Finner on 07/13/2021 08:17:51 ?

## 2021-07-14 NOTE — Progress Notes (Signed)
KHOA, OPDAHL ([id number]) ?Visit Report for 07/14/2021 ?Arrival Information Details ?Patient Name: Dennis Hicks, Dennis Hicks ?Date of Service: 07/14/2021 8:00 AM ?Medical Record Number: [id number] ?Patient Account Number: [id number] ?Date of Birth/Sex: 1956/01/09 (66 y.o. M) ?Treating RN: Carlene Coria ?Primary Care Shweta Aman: Diona Browner, Amy ?Other Clinician: Donavan Burnet ?Referring Rambo Sarafian: Diona Browner, Amy ?Treating Julyssa Kyer/Extender: Kalman Shan ?Weeks in Treatment: 2 ?Visit Information History Since Last Visit ?All ordered tests and consults were completed: Yes ?Patient Arrived: Ambulatory ?Added or deleted any medications: No ?Arrival Time: 07:50 ?Any new allergies or adverse reactions: No ?Accompanied By: self ?Had a fall or experienced change in No ?Transfer Assistance: None ?activities of daily living that may affect ?Patient Identification Verified: Yes ?risk of falls: ?Secondary Verification Process Completed: Yes ?Signs or symptoms of abuse/neglect since last visito No ?Patient Requires Transmission-Based Precautions: No ?Hospitalized since last visit: No ?Patient Has Alerts: No ?Implantable device outside of the clinic excluding No ?cellular tissue based products placed in the center ?since last visit: ?Pain Present Now: No ?Electronic Signature(s) ?Signed: 07/14/2021 11:53:04 AM By: Donavan Burnet CHT, EMT, BS ?Entered By: Donavan Burnet on 07/14/2021 11:29:02 ?Dennis Hicks, Dennis F. ([id number]) ?-------------------------------------------------------------------------------- ?Encounter Discharge Information Details ?Patient Name: Dennis Hicks, Dennis Hicks ?Date of Service: 07/14/2021 8:00 AM ?Medical Record Number: [id number] ?Patient Account Number: [id number] ?Date of Birth/Sex: [birth year] (66 y.o. M) ?Treating RN: Carlene Coria ?Primary Care Aleigha Gilani: Diona Browner, Amy ?Other Clinician: Jacqulyn Bath ?Referring Tasheena Wambolt: Diona Browner, Amy ?Treating Geno Sydnor/Extender: Kalman Shan ?Weeks in Treatment: 2 ?Encounter Discharge  Information Items ?Discharge Condition: Stable ?Ambulatory Status: Ambulatory ?Discharge Destination: Home ?Transportation: Private Auto ?Accompanied By: self ?Schedule Follow-up Appointment: No ?Clinical Summary of Care: ?Electronic Signature(s) ?Signed: 07/14/2021 11:53:04 AM By: Donavan Burnet CHT, EMT, BS ?Entered By: Donavan Burnet on 07/14/2021 11:30:50 ?Dennis Hicks, Dennis F. ([id number]) ?-------------------------------------------------------------------------------- ?Vitals Details ?Patient Name: Dennis Hicks, Dennis Hicks ?Date of Service: 07/14/2021 8:00 AM ?Medical Record Number: [id number] ?Patient Account Number: [id number] ?Date of Birth/Sex: 12-May-1955 (66 y.o. M) ?Treating RN: Carlene Coria ?Primary Care Seleen Walter: Diona Browner, Amy ?Other Clinician: Donavan Burnet ?Referring Cayleigh Paull: Diona Browner, Amy ?Treating Illianna Paschal/Extender: Kalman Shan ?Weeks in Treatment: 2 ?Vital Signs ?Time Taken: 08:06 ?Temperature (??F): 98.3 ?Height (in): 68 ?Pulse (bpm): 90 ?Weight (lbs): 185 ?Respiratory Rate (breaths/min): 18 ?Body Mass Index (BMI): 28.1 ?Blood Pressure (mmHg): 118/68 ?Capillary Blood Glucose (mg/dl): 173 ?Reference Range: 80 - 120 mg / dl ?Electronic Signature(s) ?Signed: 07/14/2021 11:53:04 AM By: Donavan Burnet CHT, EMT, BS ?Entered By: Donavan Burnet on 07/14/2021 11:29:13 ?

## 2021-07-14 NOTE — Progress Notes (Signed)
AHMET, SCHANK ([id number]) ?Visit Report for 07/13/2021 ?Problem List Details ?Patient Name: Dennis Hicks, Dennis Hicks ?Date of Service: 07/13/2021 8:00 AM ?Medical Record Number: [id number] ?Patient Account Number: [id number] ?Date of Birth/Sex: 1956-01-07 (66 y.o. M) ?Treating RN: Carlene Coria ?Primary Care Provider: Diona Browner, Amy ?Other Clinician: Jacqulyn Bath ?Referring Provider: Diona Browner, Amy ?Treating Provider/Extender: Jeri Cos ?Weeks in Treatment: 2 ?Active Problems ?ICD-10 ?Encounter ?Code Description Active Date MDM ?Diagnosis ?N30.41 Irradiation cystitis with hematuria 06/25/2021 No Yes ?N39.3 Stress incontinence (male) (male) 06/25/2021 No Yes ?Z85.46 Personal history of malignant neoplasm of prostate 06/25/2021 No Yes ?E11.69 Type 2 diabetes mellitus with other specified complication 04/27/2534 No Yes ?Inactive Problems ?Resolved Problems ?Electronic Signature(s) ?Signed: 07/13/2021 6:06:05 PM By: Worthy Keeler PA-C ?Entered By: Worthy Keeler on 07/13/2021 18:06:05 ?[name], [name] ([id number]) ?-------------------------------------------------------------------------------- ?SuperBill Details ?Patient Name: Dennis Hicks, Dennis Hicks. ?Date of Service: 07/13/2021 ?Medical Record Number: [id number] ?Patient Account Number: [id number] ?Date of Birth/Sex: [birth year] (66 y.o. M) ?Treating RN: Carlene Coria ?Primary Care Provider: Diona Browner, Amy ?Other Clinician: Jacqulyn Bath ?Referring Provider: Diona Browner, Amy ?Treating Provider/Extender: Jeri Cos ?Weeks in Treatment: 2 ?Diagnosis Coding ?ICD-10 Codes ?Code Description ?N30.41 Irradiation cystitis with hematuria ?N39.3 Stress incontinence (male) (male) ?Z85.46 Personal history of malignant neoplasm of prostate ?E11.69 Type 2 diabetes mellitus with other specified complication ?Facility Procedures ?CPT4 Code: 43329518 ?Description: (Facility Use Only) HBOT, full body chamber, 69mn ?Modifier: ?Quantity: 4 ?Physician Procedures ?CPT4 Code: 68416606?Description: 99183 - WC PHYS  HYPERBARIC OXYGEN THERAPY ?Modifier: ?Quantity: 1 ?CPT4 Code: ?Description: ICD-10 Diagnosis Description N30.41 Irradiation cystitis with hematuria Z85.46 Personal history of malignant neoplasm of prostate E11.69 Type 2 diabetes mellitus with other specified complication ?Modifier: ?Quantity: ?Electronic Signature(s) ?Signed: 07/13/2021 6:06:18 PM By: SWorthy KeelerPA-C ?Previous Signature: 07/13/2021 2:10:43 PM Version By: WEnedina FinnerRCP, RRT, CHT ?Entered By: SWorthy Keeleron 07/13/2021 18:06:18 ?

## 2021-07-14 NOTE — Progress Notes (Signed)
Dennis Hicks, Dennis Hicks ([id number]) ?Visit Report for 07/14/2021 ?HBO Details ?Patient Name: Dennis Hicks, Dennis Hicks ?Date of Service: 07/14/2021 8:00 AM ?Medical Record Number: [id number] ?Patient Account Number: [id number] ?Date of Birth/Sex: 10-Jun-1955 (66 y.o. 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 ?HBO Treatment Details ?Treatment Number: 8 ?Patient Type: Outpatient ?Chamber Type: Monoplace ?Chamber Serial #: [id number] ?Treatment Protocol: 2.5 ATA with 90 minutes oxygen, with two 5 minute air breaks ?Treatment Details ?Compression Rate Down: 1.5 psi / minute ?De-Compression Rate Up: 2.0 psi / minute ?Compress Tx Pressure Air breaks and breathing periods Decompress Decompress ?Begins Reached (leave unused spaces blank) Begins Ends ?Chamber Pressure (ATA) 1 2.5 2.5 2.5 2.5 2.5 - - 2.5 1 ?Clock Time (24 hr) 08:19 08:33 09:03 09:08 09:38 09:43 - - 10:13 10:24 ?Treatment Length: 125 (minutes) ?Treatment Segments: 4 ?Vital Signs ?Capillary Blood Glucose Reference Range: 80 - 120 mg / dl ?HBO Diabetic Blood Glucose Intervention Range: <131 mg/dl or >249 mg/dl ?Time Capillary Blood Glucose Pulse ?Blood Respiratory Action ?Type: Vitals ?Pulse: ?Temperature: Glucose Meter Oximetry ?Pressure: ?Rate: Taken: ?Taken: ?(mg/dl): ?#: (%) ?Pre 08:06 118/68 90 18 98.3 173 1 ?> or = 101, Discharge per ?Post 10:41 122/72 60 18 97.8 113 1 ?protocol. ?Treatment Response ?Treatment Toleration: Well ?Treatment Completion ?Treatment Completed without Adverse Event ?Status: ?HBO Attestation ?I certify that I supervised this HBO treatment in accordance with ?Medicare guidelines. A trained  emergency response team is readily Yes ?available per hospital policies and procedures. ?Continue HBOT as ordered. Yes ?Electronic Signature(s) ?Signed: 07/14/2021 5:26:07 PM By: Kalman Shan DO ?Previous Signature: 07/14/2021 11:53:04 AM Version By: Donavan Burnet CHT, EMT, BS ?Entered By: Kalman Shan on 07/14/2021 15:45:35 ?Dennis Hicks, Dennis Hicks ([id number]) ?-------------------------------------------------------------------------------- ?HBO Safety Checklist Details ?Patient Name: Dennis Hicks, Dennis Hicks ?Date of Service: 07/14/2021 8:00 AM ?Medical Record Number: [id number] ?Patient Account Number: [id number] ?Date of Birth/Sex: 12-Dec-1955 (66 y.o. M) ?Treating RN: Carlene Coria ?Primary Care Tobey Lippard: Diona Browner, Amy ?Other Clinician: Donavan Burnet ?Referring Mayrani Khamis: Diona Browner, Amy ?Treating Keldrick Pomplun/Extender: Kalman Shan ?Weeks in Treatment: 2 ?HBO Safety Checklist Items ?Safety Checklist ?Consent Form Signed ?Patient voided / foley secured and emptied ?When did you last eato am ?Last dose of injectable or oral agent am ?Ostomy pouch emptied and vented if applicable ?NA ?All implantable devices assessed, documented and approved ?NA ?Intravenous access site secured and place ?NA ?Valuables secured ?Linens and cotton and cotton/polyester blend (less than 51% polyester) ?Personal oil-based products / skin lotions / body lotions removed ?Wigs or hairpieces removed ?NA ?Smoking or tobacco materials removed ?NA ?Books / newspapers / magazines / loose paper removed ?Cologne, aftershave, perfume and deodorant removed ?Jewelry removed (may wrap wedding band) ?Make-up removed ?NA ?Hair care products removed ?NA ?Battery operated devices (external) removed ?Heating patches and chemical warmers removed ?Titanium eyewear removed ?Nail polish cured greater than 10 hours ?NA ?Casting material cured greater than 10 hours ?NA ?Hearing aids removed ?NA ?Loose dentures or partials removed ?NA ?Prosthetics have been  removed ?NA ?Patient demonstrates correct use of air break device (if applicable) ?Patient concerns have been addressed ?Patient grounding bracelet on and cord attached to chamber ?Specifics for Inpatients (complete in addition to ?above) ?NA Medication sheet sent with patient ?Intravenous medications needed or due during therapy sent  with patient ?NA ?Drainage tubes (e.g. nasogastric tube or chest tube secured and vented) ?NA ?Endotracheal or Tracheotomy tube secured ?NA ?Cuff deflated of air and inflated with saline ?NA ?Airway suctioned ?NA ?Notes ?Paper version used prior to treatment. ?Electronic Signature(s) ?Signed: 07/14/2021 11:53:04 AM By: Donavan Burnet CHT, EMT, BS ?Entered By: Donavan Burnet on 07/14/2021 11:29:29 ?

## 2021-07-14 NOTE — Progress Notes (Signed)
WILGUS, DEYTON ([id number]) ?Visit Report for 07/13/2021 ?HBO Details ?Patient Name: Dennis Hicks, Dennis Hicks ?Date of Service: 07/13/2021 8:00 AM ?Medical Record Number: [id number] ?Patient Account Number: [id number] ?Date of Birth/Sex: 1955/07/21 (66 y.o. 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 ?HBO Treatment Details ?Treatment Number: 7 ?Patient Type: Outpatient ?Chamber Type: Monoplace ?Chamber Serial #: [id number] ?Treatment Protocol: 2.5 ATA with 90 minutes oxygen, with two 5 minute air breaks ?Treatment Details ?Compression Rate Down: 1.5 psi / minute ?De-Compression Rate Up: 2.0 psi / minute ?Compress Tx Pressure Air breaks and breathing periods Decompress Decompress ?Begins Reached (leave unused spaces blank) Begins Ends ?Chamber Pressure (ATA) 1 2.5 2.5 2.5 2.5 2.5 - - 2.5 1 ?Clock Time (24 hr) 08:10 08:25 08:56 09:01 09:31 08:36 - - 10:06 10:15 ?Treatment Length: 125 (minutes) ?Treatment Segments: 4 ?Vital Signs ?Capillary Blood Glucose Reference Range: 80 - 120 mg / dl ?HBO Diabetic Blood Glucose Intervention Range: <131 mg/dl or >249 mg/dl ?Time Vitals Blood Respiratory Capillary Blood Glucose Pulse Action ?Type: ?Pulse: Temperature: ?Taken: ?Pressure: ?Rate: ?Glucose (mg/dl): ?Meter #: Oximetry (%) Taken: ?Pre 08:03 112/66 90 18 98.8 173 1 none per protocol ?Post 10:27 128/74 72 18 97.9 129 1 none per protocol ?Treatment Response ?Treatment Toleration: Well ?Treatment Completion ?Treatment Completed without Adverse Event ?Status: ?HBO Attestation ?I certify that I supervised this HBO treatment in accordance with ?Medicare guidelines. A trained emergency  response team is readily Yes ?available per hospital policies and procedures. ?Continue HBOT as ordered. Yes ?Electronic Signature(s) ?Signed: 07/13/2021 6:06:12 PM By: Worthy Keeler PA-C ?Previous Signature: 07/13/2021 2:10:43 PM Version By: Enedina Finner RCP, RRT, CHT ?Entered By: Worthy Keeler on 07/13/2021 18:06:12 ?Dennis Hicks, Dennis Hicks ([id number]) ?-------------------------------------------------------------------------------- ?HBO Safety Checklist Details ?Patient Name: Dennis Hicks, Dennis Hicks ?Date of Service: 07/13/2021 8:00 AM ?Medical Record Number: [id number] ?Patient Account Number: [id number] ?Date of Birth/Sex: 02/05/1956 (66 y.o. M) ?Treating RN: Carlene Coria ?Primary Care Narissa Beaufort: Diona Browner, Amy ?Other Clinician: Jacqulyn Bath ?Referring Senetra Dillin: Diona Browner, Amy ?Treating Daphane Odekirk/Extender: Jeri Cos ?Weeks in Treatment: 2 ?HBO Safety Checklist Items ?Safety Checklist ?Consent Form Signed ?Patient voided / foley secured and emptied ?When did you last eato am ?Last dose of injectable or oral agent am ?Ostomy pouch emptied and vented if applicable ?NA ?All implantable devices assessed, documented and approved ?NA ?Intravenous access site secured and place ?NA ?Valuables secured ?Linens and cotton and cotton/polyester blend (less than 51% polyester) ?Personal oil-based products / skin lotions / body lotions removed ?Wigs or hairpieces removed ?NA ?Smoking or tobacco materials removed ?NA ?Books / newspapers / magazines / loose paper removed ?NA ?Cologne, aftershave, perfume and deodorant removed ?Jewelry removed (may wrap wedding band) ?Make-up removed ?NA ?Hair care products removed ?Battery operated devices (external) removed ?NA ?Heating patches and chemical warmers removed ?NA ?Titanium eyewear removed ?NA ?Nail polish cured greater than 10 hours ?NA ?Casting material cured greater than 10 hours ?NA ?Hearing aids removed ?NA ?Loose dentures or partials removed ?NA ?Prosthetics have been  removed ?NA ?Patient demonstrates correct use of air break device (if applicable) ?Patient concerns have been addressed ?Patient grounding bracelet on and cord attached to chamber ?Specifics for Inpatients (complete in addition to ?above) ?Medication sheet sent with patient ?Intravenous medications needed or  due during therapy sent with patient ?Drainage tubes (e.g. nasogastric tube or chest tube secured and vented) ?Endotracheal or Tracheotomy tube secured ?Cuff deflated of air and inflated with saline ?Airway suctioned ?Electronic Signature(s) ?Signed: 07/13/2021 2:10:43 PM By: Enedina Finner RCP, RRT, CHT ?Entered By: Enedina Finner on 07/13/2021 08:18:47 ?

## 2021-07-15 ENCOUNTER — Encounter: Payer: 59 | Admitting: Internal Medicine

## 2021-07-15 DIAGNOSIS — N3041 Irradiation cystitis with hematuria: Secondary | ICD-10-CM | POA: Diagnosis not present

## 2021-07-15 LAB — GLUCOSE, CAPILLARY
Glucose-Capillary: 166 mg/dL — ABNORMAL HIGH (ref 70–99)
Glucose-Capillary: 120 mg/dL — ABNORMAL HIGH (ref 70–99)

## 2021-07-15 NOTE — Progress Notes (Addendum)
BRIX, BREARLEY ([id number]) ?Visit Report for 07/15/2021 ?Arrival Information Details ?Patient Name: Dennis Hicks, Dennis Hicks ?Date of Service: 07/15/2021 8:00 AM ?Medical Record Number: [id number] ?Patient Account Number: [id number] ?Date of Birth/Sex: 25-Sep-1955 (66 y.o. M) ?Treating RN: Carlene Coria ?Primary Care Adalida Garver: Diona Browner, Amy ?Other Clinician: Donavan Burnet ?Referring Brighten Buzzelli: Diona Browner, Amy ?Treating Sherea Liptak/Extender: Ricard Dillon ?Weeks in Treatment: 2 ?Visit Information History Since Last Visit ?All ordered tests and consults were completed: Yes ?Patient Arrived: Ambulatory ?Added or deleted any medications: No ?Arrival Time: 08:00 ?Any new allergies or adverse reactions: No ?Accompanied By: self ?Had a fall or experienced change in No ?Transfer Assistance: None ?activities of daily living that may affect ?Patient Identification Verified: Yes ?risk of falls: ?Secondary Verification Process Completed: Yes ?Signs or symptoms of abuse/neglect since last visito No ?Patient Requires Transmission-Based Precautions: No ?Hospitalized since last visit: No ?Patient Has Alerts: No ?Implantable device outside of the clinic excluding No ?cellular tissue based products placed in the center ?since last visit: ?Pain Present Now: No ?Electronic Signature(s) ?Signed: 07/15/2021 11:32:28 AM By: Donavan Burnet CHT, EMT, BS ?Previous Signature: 07/15/2021 9:21:51 AM Version By: Donavan Burnet CHT, EMT, BS ?Previous Signature: 07/15/2021 9:02:07 AM Version By: Donavan Burnet CHT, EMT, BS ?Entered By: Donavan Burnet on 07/15/2021 11:32:28 ?PARK, BECK F. ([id number]) ?-------------------------------------------------------------------------------- ?Encounter Discharge Information Details ?Patient Name: Dennis Hicks, Dennis Hicks ?Date of Service: 07/15/2021 8:00 AM ?Medical Record Number: [id number] ?Patient Account Number: [id number] ?Date of Birth/Sex: [birth year] (66 y.o. M) ?Treating RN: Carlene Coria ?Primary Care Christia Domke: Diona Browner,  Amy ?Other Clinician: Donavan Burnet ?Referring Sharice Harriss: Diona Browner, Amy ?Treating Keo Schirmer/Extender: Ricard Dillon ?Weeks in Treatment: 2 ?Encounter Discharge Information Items ?Discharge Condition: Stable ?Ambulatory Status: Ambulatory ?Discharge Destination: Home ?Transportation: Private Auto ?Accompanied By: self ?Schedule Follow-up Appointment: No ?Clinical Summary of Care: ?Electronic Signature(s) ?Signed: 07/15/2021 11:20:30 AM By: Donavan Burnet CHT, EMT, BS ?Entered By: Donavan Burnet on 07/15/2021 11:20:30 ?[name] ([id number]) ?-------------------------------------------------------------------------------- ?Vitals Details ?Patient Name: Dennis Hicks, Dennis Hicks ?Date of Service: 07/15/2021 8:00 AM ?Medical Record Number: [id number] ?Patient Account Number: [id number] ?Date of Birth/Sex: 1955-11-26 (66 y.o. M) ?Treating RN: Carlene Coria ?Primary Care Hanni Milford: Diona Browner, Amy ?Other Clinician: Donavan Burnet ?Referring Ryelan Kazee: Diona Browner, Amy ?Treating Antoine Fiallos/Extender: Ricard Dillon ?Weeks in Treatment: 2 ?Vital Signs ?Time Taken: 08:04 ?Temperature (??F): 98.4 ?Height (in): 68 ?Pulse (bpm): 72 ?Weight (lbs): 185 ?Respiratory Rate (breaths/min): 18 ?Body Mass Index (BMI): 28.1 ?Blood Pressure (mmHg): 118/70 ?Capillary Blood Glucose (mg/dl): 166 ?Reference Range: 80 - 120 mg / dl ?Electronic Signature(s) ?Signed: 07/15/2021 11:33:00 AM By: Donavan Burnet CHT, EMT, BS ?Previous Signature: 07/15/2021 9:22:24 AM Version By: Donavan Burnet CHT, EMT, BS ?Entered By: Donavan Burnet on 07/15/2021 11:33:00 ?

## 2021-07-15 NOTE — Progress Notes (Addendum)
DIONE, MCCOMBIE ([id number]) ?Visit Report for 07/15/2021 ?HBO Details ?Patient Name: Dennis Hicks, Dennis Hicks ?Date of Service: 07/15/2021 8:00 AM ?Medical Record Number: [id number] ?Patient Account Number: [id number] ?Date of Birth/Sex: [birth year] (66 y.o. 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 ?HBO Treatment Details ?Treatment Number: 3 ?Patient Type: Outpatient ?Chamber Type: Monoplace ?Chamber Serial #: [id number] ?Treatment Protocol: 2.5 ATA with 90 minutes oxygen, with two 5 minute air breaks ?Treatment Details ?Compression Rate Down: 1.5 psi / minute ?De-Compression Rate Up: 1.5 psi / minute ?Compress Tx Pressure Air breaks and breathing periods Decompress Decompress ?Begins Reached (leave unused spaces blank) Begins Ends ?Chamber Pressure (ATA) 1 2.5 2.5 2.5 2.5 2.5 - - 2.5 1 ?Clock Time (24 hr) 08:12 08:28 08:58 09:03 09:33 09:38 - - 10:08 10:20 ?Treatment Length: 128 (minutes) ?Treatment Segments: 4 ?Vital Signs ?Capillary Blood Glucose Reference Range: 80 - 120 mg / dl ?HBO Diabetic Blood Glucose Intervention Range: <131 mg/dl or >249 mg/dl ?Time Vitals Blood Respiratory Capillary Blood Glucose Pulse Action ?Type: ?Pulse: Temperature: ?Taken: ?Pressure: ?Rate: ?Glucose (mg/dl): ?Meter #: Oximetry (%) Taken: ?Pre 08:04 118/70 72 18 98.4 166 1 ?Post 10:23 122/68 60 18 98.3 120 1 ?Treatment Response ?Treatment Toleration: Well ?Treatment Completion ?Treatment Completed without Adverse Event ?Status: ?Mariel Gaudin Notes ?No concerns with treatment given ?HBO Attestation ?I certify that I supervised this HBO treatment in accordance with ?Medicare guidelines. A  trained emergency response team is readily Yes ?available per hospital policies and procedures. ?Continue HBOT as ordered. Yes ?Electronic Signature(s) ?Signed: 07/15/2021 4:18:52 PM By: Linton Ham MD ?Previous Signature: 07/15/2021 1:29:04 PM Version By: Donavan Burnet CHT, EMT, BS ?Previous Signature: 07/15/2021 11:19:34 AM Version By: Donavan Burnet CHT, EMT, BS ?Previous Signature: 07/15/2021 9:24:13 AM Version By: Donavan Burnet CHT, EMT, BS ?Entered By: Linton Ham on 07/15/2021 16:16:54 ?AYDDEN, CUMPIAN ([id number]) ?-------------------------------------------------------------------------------- ?HBO Safety Checklist Details ?Patient Name: Dennis Hicks ?Date of Service: 07/15/2021 8:00 AM ?Medical Record Number: [id number] ?Patient Account Number: [id number] ?Date of Birth/Sex: 1956-01-06 (66 y.o. M) ?Treating RN: Carlene Coria ?Primary Care Basia Mcginty: Dennis Hicks, Amy ?Other Clinician: Donavan Burnet ?Referring Dennis Hicks: Dennis Hicks, Amy ?Treating Jdyn Parkerson/Extender: Ricard Dillon ?Weeks in Treatment: 2 ?HBO Safety Checklist Items ?Safety Checklist ?Consent Form Signed ?Patient voided / foley secured and emptied ?When did you last eato 0630 ?Last dose of injectable or oral agent N/A ?Ostomy pouch emptied and vented if applicable ?NA ?All implantable devices assessed, documented and approved ?NA ?Intravenous access site secured and place ?NA ?Valuables secured ?Linens and cotton and cotton/polyester blend (less than 51% polyester) ?Personal oil-based products / skin lotions / body lotions removed ?Wigs or hairpieces removed ?NA ?Smoking or tobacco materials removed ?NA ?Books / newspapers / magazines / loose paper removed ?Cologne, aftershave, perfume and deodorant removed ?Jewelry removed (may wrap wedding band) ?Make-up removed ?NA ?Hair care products removed ?Battery operated devices (external) removed ?Heating patches and chemical warmers removed ?Titanium eyewear removed ?NA ?Nail polish cured  greater than 10 hours ?NA ?Casting material cured greater than 10 hours ?NA ?Hearing aids removed ?NA ?Loose dentures or partials removed ?NA ?Prosthetics have been removed ?NA ?Patient demonstrates correct use of air break device (if applicable) ?Patient concerns have been addressed ?Patient grounding bracelet on and  cord attached to chamber ?Specifics for Inpatients (complete in addition to ?above) ?NA Medication sheet sent with patient ?Intravenous medications needed or due during therapy sent with patient ?NA ?Drainage tubes (e.g. nasogastric tube or chest tube secured and vented) ?NA ?Endotracheal or Tracheotomy tube secured ?NA ?Cuff deflated of air and inflated with saline ?NA ?Airway suctioned ?NA ?Notes ?Paper version used prior to treatment. ?Electronic Signature(s) ?Signed: 07/15/2021 9:23:28 AM By: Donavan Burnet CHT, EMT, BS ?Entered By: Donavan Burnet on 07/15/2021 27:67:01 ?

## 2021-07-16 ENCOUNTER — Encounter: Payer: 59 | Admitting: Physician Assistant

## 2021-07-16 DIAGNOSIS — N3041 Irradiation cystitis with hematuria: Secondary | ICD-10-CM | POA: Diagnosis not present

## 2021-07-16 LAB — GLUCOSE, CAPILLARY
Glucose-Capillary: 186 mg/dL — ABNORMAL HIGH (ref 70–99)
Glucose-Capillary: 132 mg/dL — ABNORMAL HIGH (ref 70–99)

## 2021-07-16 NOTE — Progress Notes (Addendum)
SHADRACK, BRUMMITT ([id number]) ?Visit Report for 07/16/2021 ?HBO Details ?Patient Name: Dennis Hicks, Dennis Hicks ?Date of Service: 07/16/2021 8:00 AM ?Medical Record Number: [id number] ?Patient Account Number: [id number] ?Date of Birth/Sex: [birth year] (66 y.o. 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 ?HBO Treatment Details ?Treatment Number: 10 ?Patient Type: Outpatient ?Chamber Type: Monoplace ?Chamber Serial #: [id number] ?Treatment Protocol: 2.5 ATA with 90 minutes oxygen, with two 5 minute air breaks ?Treatment Details ?Compression Rate Down: 1.5 psi / minute ?De-Compression Rate Up: 2.0 psi / minute ?Compress Tx Pressure Air breaks and breathing periods Decompress Decompress ?Begins Reached (leave unused spaces blank) Begins Ends ?Chamber Pressure (ATA) 1 2.5 2.5 2.5 2.5 2.5 - - 2.5 1 ?Clock Time (24 hr) 08:15 08:29 08:59 09:04 09:34 09:39 - - 10:09 10:20 ?Treatment Length: 125 (minutes) ?Treatment Segments: 4 ?Vital Signs ?Capillary Blood Glucose Reference Range: 80 - 120 mg / dl ?HBO Diabetic Blood Glucose Intervention Range: <131 mg/dl or >249 mg/dl ?Time Vitals Blood Respiratory Capillary Blood Glucose Pulse Action ?Type: ?Pulse: Temperature: ?Taken: ?Pressure: ?Rate: ?Glucose (mg/dl): ?Meter #: Oximetry (%) Taken: ?Pre 08:06 118/68 78 18 98.6 186 1 none per protocol ?Post 10:23 118/68 60 18 97.2 132 1 none per protocol ?Treatment Response ?Treatment Toleration: Well ?Treatment Completion ?Treatment Completed without Adverse Event ?Status: ?Treatment Notes ?After performing safety check and safely placing chamber in the chamber, chamber was compressed at a rate  set of 1.5 psi/min until reaching ?approximately 12.5 psi at which time patient stated that ear equalization was working and chamber compression was increased to 2.0 psi/min. ?Average compression rate was 1.58 psi/min. Patient tolerated treatment and decompression of the chamber fairly well. ?Electronic Signature(s) ?Signed: 07/16/2021 11:27:11 AM By: Donavan Burnet CHT, EMT, BS ?Signed: 07/16/2021 4:06:41 PM By: Worthy Keeler PA-C ?Previous Signature: 07/16/2021 9:03:49 AM Version By: Donavan Burnet CHT, EMT, BS ?Previous Signature: 07/16/2021 9:02:31 AM Version By: Donavan Burnet CHT, EMT, BS ?Previous Signature: 07/16/2021 8:44:45 AM Version By: Donavan Burnet CHT, EMT, BS ?Entered By: Donavan Burnet on 07/16/2021 11:27:11 ?[name] ([id number]) ?-------------------------------------------------------------------------------- ?HBO Safety Checklist Details ?Patient Name: Dennis Hicks, Dennis Hicks ?Date of Service: 07/16/2021 8:00 AM ?Medical Record Number: [id number] ?Patient Account Number: [id number] ?Date of Birth/Sex: Apr 02, 1955 (67 y.o. M) ?Treating RN: Carlene Coria ?Primary Care Varshini Arrants: Diona Browner, Amy ?Other Clinician: Donavan Burnet ?Referring Vyla Pint: Diona Browner, Amy ?Treating Leann Mayweather/Extender: Jeri Cos ?Weeks in Treatment: 3 ?HBO Safety Checklist Items ?Safety Checklist ?Consent Form Signed ?Patient voided / foley secured and emptied ?When did you last eato 0645 ?Last dose of injectable or oral agent 0630 ?Ostomy pouch emptied and vented if applicable ?NA ?All implantable devices assessed, documented and approved Artificial Urinary Sphincter ?Intravenous access site secured and place ?NA ?Valuables secured ?Linens and cotton and cotton/polyester blend (less than 51% polyester) ?Personal oil-based products / skin lotions / body lotions removed ?Wigs or hairpieces removed ?NA ?Smoking or tobacco materials removed ?NA ?Books / newspapers / magazines / loose paper removed ?Cologne, aftershave, perfume  and deodorant removed ?Jewelry removed (may wrap wedding band) ?Make-up removed ?NA ?Hair care products removed ?Battery operated devices (external) removed ?Heating patches and chemical warmers removed ?Titanium eyewear removed ?NA ?Nail polish cured greater than 10 hours ?NA ?Casting material cured greater than  10 hours ?NA ?Hearing aids removed ?NA ?Loose dentures or partials removed ?NA ?Prosthetics have been removed ?NA ?Patient demonstrates correct use of air break device (if applicable) ?Patient concerns have been addressed ?Patient grounding bracelet on and cord attached to chamber ?Specifics for Inpatients (complete in addition to ?above) ?NA Medication sheet sent with patient ?Intravenous medications needed or due during therapy sent with patient ?NA ?Drainage tubes (e.g. nasogastric tube or chest tube secured and vented) ?NA ?Endotracheal or Tracheotomy tube secured ?NA ?Cuff deflated of air and inflated with saline ?NA ?Airway suctioned ?NA ?Notes ?Paper version used prior to treatment. ?Electronic Signature(s) ?Signed: 07/16/2021 8:57:24 AM By: Donavan Burnet CHT, EMT, BS ?Previous Signature: 07/16/2021 8:41:43 AM Version By: Donavan Burnet CHT, EMT, BS ?Entered By: Donavan Burnet on 07/16/2021 08:57:24 ?

## 2021-07-16 NOTE — Progress Notes (Addendum)
CLETUS, PARIS ([id number]) ?Visit Report for 07/16/2021 ?Arrival Information Details ?Patient Name: Dennis Hicks, Dennis Hicks ?Date of Service: 07/16/2021 8:00 AM ?Medical Record Number: [id number] ?Patient Account Number: [id number] ?Date of Birth/Sex: 1956-01-02 (66 y.o. M) ?Treating RN: Carlene Coria ?Primary Care Pearce Littlefield: Diona Browner, Amy ?Other Clinician: Donavan Burnet ?Referring Ife Vitelli: Diona Browner, Amy ?Treating Kayle Correa/Extender: Jeri Cos ?Weeks in Treatment: 3 ?Visit Information History Since Last Visit ?All ordered tests and consults were completed: Yes ?Patient Arrived: Ambulatory ?Added or deleted any medications: No ?Arrival Time: 07:55 ?Any new allergies or adverse reactions: No ?Accompanied By: self ?Had a fall or experienced change in No ?Transfer Assistance: None ?activities of daily living that may affect ?Patient Identification Verified: Yes ?risk of falls: ?Secondary Verification Process Completed: Yes ?Signs or symptoms of abuse/neglect since last visito No ?Patient Requires Transmission-Based Precautions: No ?Hospitalized since last visit: No ?Patient Has Alerts: No ?Implantable device outside of the clinic excluding No ?cellular tissue based products placed in the center ?since last visit: ?Pain Present Now: No ?Electronic Signature(s) ?Signed: 07/16/2021 9:04:00 AM By: Donavan Burnet CHT, EMT, BS ?Previous Signature: 07/16/2021 8:35:57 AM Version By: Donavan Burnet CHT, EMT, BS ?Entered By: Donavan Burnet on 07/16/2021 09:04:00 ?YERICK, EGGEBRECHT F. ([id number]) ?-------------------------------------------------------------------------------- ?Encounter Discharge Information Details ?Patient Name: HERVE, HAUG ?Date of Service: 07/16/2021 8:00 AM ?Medical Record Number: [id number] ?Patient Account Number: [id number] ?Date of Birth/Sex: 11/17/1955 (66 y.o. M) ?Treating RN: Carlene Coria ?Primary Care Malary Aylesworth: Diona Browner, Amy ?Other Clinician: Donavan Burnet ?Referring Elster Corbello: Diona Browner, Amy ?Treating  Raydell Maners/Extender: Jeri Cos ?Weeks in Treatment: 3 ?Encounter Discharge Information Items ?Discharge Condition: Stable ?Ambulatory Status: Ambulatory ?Discharge Destination: Home ?Transportation: Private Auto ?Accompanied By: self ?Schedule Follow-up Appointment: No ?Clinical Summary of Care: ?Electronic Signature(s) ?Signed: 07/16/2021 11:28:06 AM By: Donavan Burnet CHT, EMT, BS ?Entered By: Donavan Burnet on 07/16/2021 11:28:06 ?MCLEAN, MOYA F. ([id number]) ?-------------------------------------------------------------------------------- ?Vitals Details ?Patient Name: PELLEGRINO, KENNARD ?Date of Service: 07/16/2021 8:00 AM ?Medical Record Number: [id number] ?Patient Account Number: [id number] ?Date of Birth/Sex: 10-20-1955 (67 y.o. M) ?Treating RN: Carlene Coria ?Primary Care Terena Bohan: Diona Browner, Amy ?Other Clinician: Donavan Burnet ?Referring Analayah Brooke: Diona Browner, Amy ?Treating Breeze Angell/Extender: Jeri Cos ?Weeks in Treatment: 3 ?Vital Signs ?Time Taken: 08:06 ?Temperature (??F): 98.6 ?Height (in): 68 ?Pulse (bpm): 78 ?Weight (lbs): 185 ?Respiratory Rate (breaths/min): 18 ?Body Mass Index (BMI): 28.1 ?Blood Pressure (mmHg): 118/68 ?Capillary Blood Glucose (mg/dl): 186 ?Reference Range: 80 - 120 mg / dl ?Electronic Signature(s) ?Signed: 07/16/2021 9:19:37 AM By: Donavan Burnet CHT, EMT, BS ?Previous Signature: 07/16/2021 8:39:13 AM Version By: Donavan Burnet CHT, EMT, BS ?Entered By: Donavan Burnet on 07/16/2021 09:19:37 ?

## 2021-07-19 ENCOUNTER — Encounter: Payer: 59 | Attending: Physician Assistant | Admitting: Physician Assistant

## 2021-07-19 DIAGNOSIS — N3041 Irradiation cystitis with hematuria: Secondary | ICD-10-CM | POA: Insufficient documentation

## 2021-07-19 DIAGNOSIS — E119 Type 2 diabetes mellitus without complications: Secondary | ICD-10-CM | POA: Insufficient documentation

## 2021-07-19 DIAGNOSIS — N393 Stress incontinence (female) (male): Secondary | ICD-10-CM | POA: Diagnosis not present

## 2021-07-19 DIAGNOSIS — Z8546 Personal history of malignant neoplasm of prostate: Secondary | ICD-10-CM | POA: Diagnosis not present

## 2021-07-19 LAB — GLUCOSE, CAPILLARY
Glucose-Capillary: 171 mg/dL — ABNORMAL HIGH (ref 70–99)
Glucose-Capillary: 115 mg/dL — ABNORMAL HIGH (ref 70–99)

## 2021-07-19 NOTE — Progress Notes (Addendum)
TREG, DIEMER ([id number]) Visit Report for 07/19/2021 Arrival Information Details Patient Name: Dennis Hicks, Dennis Hicks Date of Service: 07/19/2021 8:00 AM Medical Record Number: [id number] Patient Account Number: [id number] Date of Birth/Sex: 12/31/1955 (65 y.o. M) Treating RN: Cornell Barman Primary Care Cayley Pester: Eliezer Lofts Other Clinician: Jacqulyn Bath Referring Delon Revelo: Eliezer Lofts Treating Kupono Marling/Extender: Skipper Cliche in Treatment: 3 Visit Information History Since Last Visit Added or deleted any medications: No Patient Arrived: Ambulatory Any new allergies or adverse reactions: No Arrival Time: 07:59 Had a fall or experienced change in No Accompanied By: self activities of daily living that may affect Transfer Assistance: None risk of falls: Patient Identification Verified: Yes Signs or symptoms of abuse/neglect since last visito No Secondary Verification Process Completed: Yes Hospitalized since last visit: No Patient Requires Transmission-Based Precautions: No Implantable device outside of the clinic excluding No Patient Has Alerts: No cellular tissue based products placed in the center since last visit: Pain Present Now: No Electronic Signature(s) Signed: 08/31/2021 12:09:56 PM By: Gretta Cool, BSN, RN, CWS, Kim RN, BSN Previous Signature: 07/19/2021 2:08:16 PM Version By: Enedina Finner RCP, RRT, CHT Entered By: Gretta Cool, BSN, RN, CWS, Kim on 08/31/2021 12:09:56 Dennis Hicks ([id number]) -------------------------------------------------------------------------------- Encounter Discharge Information Details Patient Name: Dennis Hicks Date of Service: 07/19/2021 8:00 AM Medical Record Number: [id number] Patient Account Number: [id number] Date of Birth/Sex: 11-11-1955 (65 y.o. M) Treating RN: Cornell Barman Primary Care Blanche Scovell: Eliezer Lofts Other Clinician: Jacqulyn Bath Referring Charletha Dalpe: Eliezer Lofts Treating Maycie Luera/Extender: Skipper Cliche in Treatment:  3 Encounter Discharge Information Items Discharge Condition: Stable Ambulatory Status: Ambulatory Discharge Destination: Home Transportation: Private Auto Accompanied By: self Schedule Follow-up Appointment: Yes Clinical Summary of Care: Notes Patient has an HBO treatment scheduled on 07/20/21 at 08:00 am. Electronic Signature(s) Signed: 08/31/2021 12:10:51 PM By: Gretta Cool, BSN, RN, CWS, Kim RN, BSN Previous Signature: 07/19/2021 2:08:16 PM Version By: Enedina Finner RCP, RRT, CHT Entered By: Gretta Cool, BSN, RN, CWS, Kim on 08/31/2021 12:10:51 Dennis Hicks ([id number]) -------------------------------------------------------------------------------- Vitals Details Patient Name: Dennis Hicks Date of Service: 07/19/2021 8:00 AM Medical Record Number: [id number] Patient Account Number: [id number] Date of Birth/Sex: March 11, 1956 (65 y.o. M) Treating RN: Cornell Barman Primary Care Elham Fini: Eliezer Lofts Other Clinician: Jacqulyn Bath Referring Jamarl Pew: Eliezer Lofts Treating Tristin Gladman/Extender: Skipper Cliche in Treatment: 3 Vital Signs Time Taken: 07:59 Temperature (F): 98.0 Height (in): 68 Pulse (bpm): 72 Weight (lbs): 185 Respiratory Rate (breaths/min): 18 Body Mass Index (BMI): 28.1 Blood Pressure (mmHg): 134/76 Capillary Blood Glucose (mg/dl): 199 Reference Range: 80 - 120 mg / dl Electronic Signature(s) Signed: 08/31/2021 12:10:01 PM By: Gretta Cool, BSN, RN, CWS, Kim RN, BSN Previous Signature: 07/19/2021 2:08:16 PM Version By: Enedina Finner RCP, RRT, CHT Entered By: Gretta Cool, BSN, RN, CWS, Kim on 08/31/2021 12:10:00

## 2021-07-19 NOTE — Progress Notes (Addendum)
KAION, TISDALE ([id number]) Visit Report for 07/19/2021 HBO Details Patient Name: Dennis Hicks, Dennis Hicks Date of Service: 07/19/2021 8:00 AM Medical Record Number: [id number] Patient Account Number: [id number] Date of Birth/Sex: [birth year] (65 y.o. 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 HBO Treatment Details Treatment Number: 11 Patient Type: Outpatient Chamber Type: Monoplace Chamber Serial #: [id number] Treatment Protocol: 2.5 ATA with 90 minutes oxygen, with two 5 minute air breaks Treatment Details Compression Rate Down: 1.5 psi / minute De-Compression Rate Up: Compress Tx Pressure Air breaks and breathing periods Decompress Decompress Begins Reached (leave unused spaces blank) Begins Ends Chamber Pressure (ATA) 1 2.5 2.5 2.5 2.5 2.5 - - 2.5 1 Clock Time (24 hr) 08:12 08:27 08:57 09:02 09:32 09:37 - - 10:08 11:19 Treatment Length: 187 (minutes) Treatment Segments: 6 Vital Signs Capillary Blood Glucose Reference Range: 80 - 120 mg / dl HBO Diabetic Blood Glucose Intervention Range: <131 mg/dl or >249 mg/dl Time Vitals Blood Respiratory Capillary Blood Glucose Pulse Action Type: Pulse: Temperature: Taken: Pressure: Rate: Glucose (mg/dl): Meter #: Oximetry (%) Taken: Pre 07:59 134/76 72 18 98 199 1 none per protocol Post 10:31 120/78 72 18 98.4 1115 1 none per protocol Treatment Response Treatment Toleration: Well Treatment Completion Treatment Completed without Adverse Event Status: Electronic Signature(s) Signed: 08/31/2021 12:10:36 PM By: Gretta Cool, BSN, RN, CWS, Kim RN, BSN Signed: 09/01/2021 8:45:50 AM By: Worthy Keeler  PA-C Previous Signature: 08/31/2021 12:10:10 PM Version By: Gretta Cool, BSN, RN, CWS, Kim RN, BSN Previous Signature: 07/19/2021 2:08:16 PM Version By: Enedina Finner RCP, RRT, CHT Previous Signature: 07/19/2021 4:35:29 PM Version By: Worthy Keeler PA-C Entered By: Gretta Cool, BSN, RN, CWS, Kim on 08/31/2021 12:10:35 NOTNAMED, SCHOLZ ([id number]) -------------------------------------------------------------------------------- HBO Safety Checklist Details Patient Name: Dennis Hicks Date of Service: 07/19/2021 8:00 AM Medical Record Number: [id number] Patient Account Number: [id number] Date of Birth/Sex: Feb 05, 1956 (65 y.o. M) Treating RN: Cornell Barman Primary Care Angle Dirusso: Eliezer Lofts Other Clinician: Jacqulyn Bath Referring Jaretzy Lhommedieu: Eliezer Lofts Treating Sandra Tellefsen/Extender: Skipper Cliche in Treatment: 3 HBO Safety Checklist Items Safety Checklist Consent Form Signed Patient voided / foley secured and emptied When did you last eato am Last dose of injectable or oral agent am Ostomy pouch emptied and vented if applicable NA All implantable devices assessed, documented and approved NA Intravenous access site secured and place NA Valuables secured Linens and cotton and cotton/polyester blend (less than 51% polyester) Personal oil-based products / skin lotions / body lotions removed Wigs or hairpieces removed NA Smoking or tobacco materials removed NA Books / newspapers / magazines / loose paper removed NA Cologne, aftershave, perfume and deodorant removed Jewelry removed (may wrap wedding band) NA Make-up removed NA Hair care products removed Battery operated devices (external) removed NA Heating patches and chemical warmers removed NA Titanium eyewear removed NA Nail polish cured greater than 10 hours NA Casting material cured greater than 10 hours NA Hearing aids removed NA Loose dentures or partials removed NA Prosthetics have been removed NA Patient  demonstrates correct use of air break device (if applicable) Patient concerns have been addressed Patient grounding bracelet on and cord attached to chamber Specifics for Inpatients (complete in addition to above) Medication sheet sent with patient Intravenous medications  needed or due during therapy sent with patient Drainage tubes (e.g. nasogastric tube or chest tube secured and vented) Endotracheal or Tracheotomy tube secured Cuff deflated of air and inflated with saline Airway suctioned Electronic Signature(s) Signed: 08/31/2021 12:10:05 PM By: Gretta Cool, BSN, RN, CWS, Kim RN, BSN Previous Signature: 07/19/2021 2:08:16 PM Version By: Enedina Finner RCP, RRT, CHT Entered By: Gretta Cool, BSN, RN, CWS, Kim on 08/31/2021 12:10:05

## 2021-07-20 ENCOUNTER — Encounter: Payer: 59 | Admitting: Physician Assistant

## 2021-07-20 DIAGNOSIS — N3041 Irradiation cystitis with hematuria: Secondary | ICD-10-CM | POA: Diagnosis not present

## 2021-07-20 LAB — GLUCOSE, CAPILLARY
Glucose-Capillary: 155 mg/dL — ABNORMAL HIGH (ref 70–99)
Glucose-Capillary: 103 mg/dL — ABNORMAL HIGH (ref 70–99)

## 2021-07-20 NOTE — Progress Notes (Signed)
JAHAAD, PENADO ([id number]) ?Visit Report for 07/20/2021 ?Arrival Information Details ?Patient Name: Dennis Hicks, Dennis Hicks ?Date of Service: 07/20/2021 8:00 AM ?Medical Record Number: [id number] ?Patient Account Number: [id number] ?Date of Birth/Sex: [birth year] (66 y.o. M) ?Treating RN: Cornell Barman ?Primary Care Trey Gulbranson: Diona Browner, Amy ?Other Clinician: Jacqulyn Bath ?Referring Stesha Neyens: Diona Browner, Amy ?Treating Phelicia Dantes/Extender: Jeri Cos ?Weeks in Treatment: 3 ?Visit Information History Since Last Visit ?Pain Present Now: No ?Patient Arrived: Ambulatory ?Arrival Time: 08:35 ?Accompanied By: self ?Transfer Assistance: None ?Patient Identification Verified: Yes ?Secondary Verification Process Completed: Yes ?Patient Requires Transmission-Based Precautions: No ?Patient Has Alerts: No ?Electronic Signature(s) ?Signed: 07/20/2021 11:28:58 AM By: Enedina Finner RCP, RRT, CHT ?Entered By: Enedina Finner on 07/20/2021 08:40:59 ?Dennis Hicks, Dennis F. ([id number]) ?-------------------------------------------------------------------------------- ?Encounter Discharge Information Details ?Patient Name: Dennis Hicks, Dennis Hicks ?Date of Service: 07/20/2021 8:00 AM ?Medical Record Number: [id number] ?Patient Account Number: [id number] ?Date of Birth/Sex: Feb 11, 1956 (66 y.o. M) ?Treating RN: Cornell Barman ?Primary Care Briana Farner: Diona Browner, Amy ?Other Clinician: Jacqulyn Bath ?Referring Glee Lashomb: Diona Browner, Amy ?Treating Shaye Elling/Extender: Jeri Cos ?Weeks in Treatment: 3 ?Encounter Discharge Information Items ?Discharge Condition: Stable ?Ambulatory Status: Ambulatory ?Discharge Destination: Home ?Transportation: Private Auto ?Accompanied By: self ?Schedule Follow-up Appointment: Yes ?Clinical Summary of Care: ?Notes ?Patient has an HBO treatment scheduled on 07/21/21 at 08:00 am. ?Electronic Signature(s) ?Signed: 07/20/2021 11:28:58 AM By: Enedina Finner RCP, RRT, CHT ?Entered By: Enedina Finner on 07/20/2021 11:21:44 ?Dennis Hicks, Dennis  F. ([id number]) ?-------------------------------------------------------------------------------- ?Vitals Details ?Patient Name: Dennis Hicks, Dennis Hicks ?Date of Service: 07/20/2021 8:00 AM ?Medical Record Number: [id number] ?Patient Account Number: [id number] ?Date of Birth/Sex: 1955-09-04 (66 y.o. M) ?Treating RN: Cornell Barman ?Primary Care Gilberte Gorley: Diona Browner, Amy ?Other Clinician: Jacqulyn Bath ?Referring Kimley Apsey: Diona Browner, Amy ?Treating Melbert Botelho/Extender: Jeri Cos ?Weeks in Treatment: 3 ?Vital Signs ?Time Taken: 08:05 ?Temperature (??F): 98.1 ?Height (in): 68 ?Pulse (bpm): 72 ?Weight (lbs): 185 ?Respiratory Rate (breaths/min): 16 ?Body Mass Index (BMI): 28.1 ?Blood Pressure (mmHg): 112/68 ?Capillary Blood Glucose (mg/dl): 155 ?Reference Range: 80 - 120 mg / dl ?Electronic Signature(s) ?Signed: 07/20/2021 11:28:58 AM By: Enedina Finner RCP, RRT, CHT ?Entered By: Enedina Finner on 07/20/2021 08:43:04 ?

## 2021-07-20 NOTE — Progress Notes (Signed)
JOHNIE, MAKKI ([id number]) ?Visit Report for 07/20/2021 ?Problem List Details ?Patient Name: CORBIN, FALCK ?Date of Service: 07/20/2021 8:00 AM ?Medical Record Number: [id number] ?Patient Account Number: [id number] ?Date of Birth/Sex: 12-04-1955 (66 y.o. M) ?Treating RN: Cornell Barman ?Primary Care Provider: Diona Browner, Amy ?Other Clinician: Jacqulyn Bath ?Referring Provider: Diona Browner, Amy ?Treating Provider/Extender: Jeri Cos ?Weeks in Treatment: 3 ?Active Problems ?ICD-10 ?Encounter ?Code Description Active Date MDM ?Diagnosis ?N30.41 Irradiation cystitis with hematuria 06/25/2021 No Yes ?N39.3 Stress incontinence (male) (male) 06/25/2021 No Yes ?Z85.46 Personal history of malignant neoplasm of prostate 06/25/2021 No Yes ?E11.69 Type 2 diabetes mellitus with other specified complication 05/27/1015 No Yes ?Inactive Problems ?Resolved Problems ?Electronic Signature(s) ?Signed: 07/20/2021 4:19:45 PM By: Worthy Keeler PA-C ?Entered By: Worthy Keeler on 07/20/2021 16:19:45 ?DENILSON, SALMINEN F. ([id number]) ?-------------------------------------------------------------------------------- ?SuperBill Details ?Patient Name: TEION, BALLIN. ?Date of Service: 07/20/2021 ?Medical Record Number: [id number] ?Patient Account Number: [id number] ?Date of Birth/Sex: 07-12-1955 (66 y.o. M) ?Treating RN: Cornell Barman ?Primary Care Provider: Diona Browner, Amy ?Other Clinician: Jacqulyn Bath ?Referring Provider: Diona Browner, Amy ?Treating Provider/Extender: Jeri Cos ?Weeks in Treatment: 3 ?Diagnosis Coding ?ICD-10 Codes ?Code Description ?N30.41 Irradiation cystitis with hematuria ?N39.3 Stress incontinence (male) (male) ?Z85.46 Personal history of malignant neoplasm of prostate ?E11.69 Type 2 diabetes mellitus with other specified complication ?Facility Procedures ?CPT4 Code: 14431540 ?Description: (Facility Use Only) HBOT, full body chamber, 81mn ?Modifier: ?Quantity: 4 ?Physician Procedures ?CPT4 Code: 60867619?Description: 99183 - WC PHYS HYPERBARIC OXYGEN  THERAPY ?Modifier: ?Quantity: 1 ?CPT4 Code: ?Description: ICD-10 Diagnosis Description N30.41 Irradiation cystitis with hematuria Z85.46 Personal history of malignant neoplasm of prostate ?Modifier: ?Quantity: ?Electronic Signature(s) ?Signed: 07/20/2021 4:19:58 PM By: SWorthy KeelerPA-C ?Previous Signature: 07/20/2021 11:28:58 AM Version By: WEnedina FinnerRCP, RRT, CHT ?Entered By: SWorthy Keeleron 07/20/2021 16:19:58 ?

## 2021-07-20 NOTE — Progress Notes (Signed)
RAMIR, MALERBA ([id number]) ?Visit Report for 07/20/2021 ?HBO Details ?Patient Name: Dennis Hicks, Dennis Hicks ?Date of Service: 07/20/2021 8:00 AM ?Medical Record Number: [id number] ?Patient Account Number: [id number] ?Date of Birth/Sex: 01/27/1956 (66 y.o. 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 ?HBO Treatment Details ?Treatment Number: 12 ?Patient Type: Outpatient ?Chamber Type: Monoplace ?Chamber Serial #: [id number] ?Treatment Protocol: 2.5 ATA with 90 minutes oxygen, with two 5 minute air breaks ?Treatment Details ?Compression Rate Down: 1.5 psi / minute ?De-Compression Rate Up: 1.5 psi / minute ?Compress Tx Pressure Air breaks and breathing periods Decompress Decompress ?Begins Reached (leave unused spaces blank) Begins Ends ?Chamber Pressure (ATA) 1 2.5 2.5 2.5 2.5 2.5 - - 2.5 1 ?Clock Time (24 hr) 08:12 08:27 08:58 09:03 09:34 09:39 - - 10:08 10:23 ?Treatment Length: 131 (minutes) ?Treatment Segments: 4 ?Vital Signs ?Capillary Blood Glucose Reference Range: 80 - 120 mg / dl ?HBO Diabetic Blood Glucose Intervention Range: <131 mg/dl or >249 mg/dl ?Time Vitals Blood Respiratory Capillary Blood Glucose Pulse Action ?Type: ?Pulse: Temperature: ?Taken: ?Pressure: ?Rate: ?Glucose (mg/dl): ?Meter #: Oximetry (%) Taken: ?Pre 08:05 112/68 72 16 98.1 155 1 none per protocol ?Post 10:33 116/68 72 18 97.6 103 1 none per protocol ?Treatment Response ?Treatment Toleration: Well ?Treatment Completion ?Treatment Completed without Adverse Event ?Status: ?HBO Attestation ?I certify that I supervised this HBO treatment in accordance with ?Medicare guidelines. A trained emergency  response team is readily Yes ?available per hospital policies and procedures. ?Continue HBOT as ordered. Yes ?Electronic Signature(s) ?Signed: 07/20/2021 4:19:53 PM By: Worthy Keeler PA-C ?Previous Signature: 07/20/2021 11:28:58 AM Version By: Enedina Finner RCP, RRT, CHT ?Entered By: Worthy Keeler on 07/20/2021 16:19:53 ?Dennis Hicks, Dennis Hicks ([id number]) ?-------------------------------------------------------------------------------- ?HBO Safety Checklist Details ?Patient Name: Dennis Hicks, Dennis Hicks ?Date of Service: 07/20/2021 8:00 AM ?Medical Record Number: [id number] ?Patient Account Number: [id number] ?Date of Birth/Sex: 09-May-1955 (66 y.o. M) ?Treating RN: Cornell Barman ?Primary Care Pax Reasoner: Diona Browner, Amy ?Other Clinician: Jacqulyn Bath ?Referring Nate Perri: Diona Browner, Amy ?Treating Kelita Wallis/Extender: Jeri Cos ?Weeks in Treatment: 3 ?HBO Safety Checklist Items ?Safety Checklist ?Consent Form Signed ?Patient voided / foley secured and emptied ?When did you last eato am ?Last dose of injectable or oral agent am ?Ostomy pouch emptied and vented if applicable ?NA ?All implantable devices assessed, documented and approved ?NA ?Intravenous access site secured and place ?NA ?Valuables secured ?Linens and cotton and cotton/polyester blend (less than 51% polyester) ?Personal oil-based products / skin lotions / body lotions removed ?Wigs or hairpieces removed ?NA ?Smoking or tobacco materials removed ?NA ?Books / newspapers / magazines / loose paper removed ?NA ?Cologne, aftershave, perfume and deodorant removed ?Jewelry removed (may wrap wedding band) ?Make-up removed ?NA ?Hair care products removed ?Battery operated devices (external) removed ?NA ?Heating patches and chemical warmers removed ?NA ?Titanium eyewear removed ?NA ?Nail polish cured greater than 10 hours ?NA ?Casting material cured greater than 10 hours ?NA ?Hearing aids removed ?NA ?Loose dentures or partials removed ?NA ?Prosthetics have been  removed ?NA ?Patient demonstrates correct use of air break device (if applicable) ?Patient concerns have been addressed ?Patient grounding bracelet on and cord attached to chamber ?Specifics for Inpatients (complete in addition to ?above) ?Medication sheet sent with patient ?Intravenous medications needed or  due during therapy sent with patient ?Drainage tubes (e.g. nasogastric tube or chest tube secured and vented) ?Endotracheal or Tracheotomy tube secured ?Cuff deflated of air and inflated with saline ?Airway suctioned ?Electronic Signature(s) ?Signed: 07/20/2021 11:28:58 AM By: Enedina Finner RCP, RRT, CHT ?Entered By: Enedina Finner on 07/20/2021 08:44:00 ?

## 2021-07-21 ENCOUNTER — Encounter (HOSPITAL_BASED_OUTPATIENT_CLINIC_OR_DEPARTMENT_OTHER): Payer: 59 | Admitting: Internal Medicine

## 2021-07-21 DIAGNOSIS — Z8546 Personal history of malignant neoplasm of prostate: Secondary | ICD-10-CM | POA: Diagnosis not present

## 2021-07-21 DIAGNOSIS — E1169 Type 2 diabetes mellitus with other specified complication: Secondary | ICD-10-CM

## 2021-07-21 DIAGNOSIS — N3041 Irradiation cystitis with hematuria: Secondary | ICD-10-CM | POA: Diagnosis not present

## 2021-07-21 DIAGNOSIS — L598 Other specified disorders of the skin and subcutaneous tissue related to radiation: Secondary | ICD-10-CM

## 2021-07-21 DIAGNOSIS — Z923 Personal history of irradiation: Secondary | ICD-10-CM

## 2021-07-21 LAB — GLUCOSE, CAPILLARY
Glucose-Capillary: 160 mg/dL — ABNORMAL HIGH (ref 70–99)
Glucose-Capillary: 86 mg/dL (ref 70–99)

## 2021-07-21 NOTE — Progress Notes (Signed)
Dennis Hicks, Dennis Hicks ([id number]) ?Visit Report for 07/21/2021 ?HBO Details ?Patient Name: Dennis Hicks, Dennis Hicks ?Date of Service: 07/21/2021 8:00 AM ?Medical Record Number: [id number] ?Patient Account Number: [id number] ?Date of Birth/Sex: Aug 06, 1955 (66 y.o. 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 ?HBO Treatment Details ?Treatment Number: 78 ?Patient Type: Outpatient ?Chamber Type: Monoplace ?Chamber Serial #: [id number] ?Treatment Protocol: 2.5 ATA with 90 minutes oxygen, with two 5 minute air breaks ?Treatment Details ?Compression Rate Down: 1.5 psi / minute ?De-Compression Rate Up: 2.0 psi / minute ?Compress Tx Pressure Air breaks and breathing periods Decompress Decompress ?Begins Reached (leave unused spaces blank) Begins Ends ?Chamber Pressure (ATA) 1 2.5 2.5 2.5 2.5 2.5 - - 2.5 1 ?Clock Time (24 hr) 08:23 08:39 09:09 09:14 09:45 09:50 - - 10:20 10:31 ?Treatment Length: 128 (minutes) ?Treatment Segments: 4 ?Vital Signs ?Capillary Blood Glucose Reference Range: 80 - 120 mg / dl ?HBO Diabetic Blood Glucose Intervention Range: <131 mg/dl or >249 mg/dl ?Time Capillary Blood Glucose Pulse ?Blood Respiratory Action ?Type: Vitals ?Pulse: ?Temperature: Glucose Meter Oximetry ?Pressure: ?Rate: Taken: ?Taken: ?(mg/dl): ?#: (%) ?Pre 08:05 110/72 72 16 98 160 1 none per protocol ?none; patient had no adverse ?Post 10:45 86 1 ?symptoms ?Treatment Response ?Treatment Toleration: Well ?Treatment Completion ?Treatment Completed without Adverse Event ?Status: ?HBO Attestation ?I certify that I supervised this HBO treatment in accordance with ?Medicare guidelines. A trained  emergency response team is readily Yes ?available per hospital policies and procedures. ?Continue HBOT as ordered. Yes ?Electronic Signature(s) ?Signed: 07/21/2021 3:24:58 PM By: Kalman Shan DO ?Entered By: Kalman Shan on 07/21/2021 14:37:19 ?Dennis Hicks, Dennis Hicks ([id number]) ?-------------------------------------------------------------------------------- ?HBO Safety Checklist Details ?Patient Name: Dennis Hicks, Dennis Hicks ?Date of Service: 07/21/2021 8:00 AM ?Medical Record Number: [id number] ?Patient Account Number: [id number] ?Date of Birth/Sex: 1955/06/20 (66 y.o. M) ?Treating RN: Cornell Barman ?Primary Care Deangleo Passage: Diona Browner, Amy ?Other Clinician: Jacqulyn Bath ?Referring Chavy Avera: Diona Browner, Amy ?Treating Keandre Linden/Extender: Kalman Shan ?Weeks in Treatment: 3 ?HBO Safety Checklist Items ?Safety Checklist ?Consent Form Signed ?Patient voided / foley secured and emptied ?When did you last eato am ?Last dose of injectable or oral agent am ?Ostomy pouch emptied and vented if applicable ?NA ?All implantable devices assessed, documented and approved ?NA ?Intravenous access site secured and place ?NA ?Valuables secured ?Linens and cotton and cotton/polyester blend (less than 51% polyester) ?Personal oil-based products / skin lotions / body lotions removed ?Wigs or hairpieces removed ?NA ?Smoking or tobacco materials removed ?NA ?Books / newspapers / magazines / loose paper removed ?NA ?Cologne, aftershave, perfume and deodorant removed ?Jewelry removed (may wrap wedding band) ?Make-up removed ?NA ?Hair care products removed ?Battery operated devices (external) removed ?NA ?Heating patches and chemical warmers removed ?NA ?Titanium eyewear removed ?NA ?Nail polish cured greater than 10 hours ?NA ?Casting material cured greater than 10 hours ?NA ?Hearing aids removed ?NA ?Loose dentures or partials removed ?NA ?Prosthetics have been removed ?NA ?Patient demonstrates correct use of air break device (if applicable) ?Patient  concerns have been addressed ?Patient grounding bracelet on and cord attached to chamber ?Specifics for Inpatients (complete in addition to ?above) ?Medication sheet sent with patient ?Intravenous medications needed or due during therapy sent with patient ?Drainage tubes (e.g. nasogastric tube or chest tube secured and  vented) ?Endotracheal or Tracheotomy tube secured ?Cuff deflated of air and inflated with saline ?Airway suctioned ?Electronic Signature(s) ?Signed: 07/21/2021 2:38:55 PM By: Enedina Finner RCP, RRT, CHT ?Entered By: Enedina Finner on 07/21/2021 08:31:01 ?

## 2021-07-21 NOTE — Progress Notes (Signed)
BRONCO, MCGRORY ([id number]) ?Visit Report for 07/21/2021 ?Arrival Information Details ?Patient Name: Dennis Hicks, Dennis Hicks ?Date of Service: 07/21/2021 8:00 AM ?Medical Record Number: [id number] ?Patient Account Number: [id number] ?Date of Birth/Sex: Nov 02, 1955 (66 y.o. M) ?Treating RN: Cornell Barman ?Primary Care Halvor Behrend: Diona Browner, Amy ?Other Clinician: Jacqulyn Bath ?Referring Verita Kuroda: Diona Browner, Amy ?Treating Keyri Salberg/Extender: Kalman Shan ?Weeks in Treatment: 3 ?Visit Information History Since Last Visit ?Added or deleted any medications: No ?Patient Arrived: Ambulatory ?Any new allergies or adverse reactions: No ?Arrival Time: 07:55 ?Had a fall or experienced change in No ?Accompanied By: self ?activities of daily living that may affect ?Transfer Assistance: None ?risk of falls: ?Patient Identification Verified: Yes ?Signs or symptoms of abuse/neglect since last visito No ?Secondary Verification Process Completed: Yes ?Hospitalized since last visit: No ?Patient Requires Transmission-Based Precautions: No ?Implantable device outside of the clinic excluding No ?Patient Has Alerts: No ?cellular tissue based products placed in the center ?since last visit: ?Pain Present Now: No ?Electronic Signature(s) ?Signed: 07/21/2021 2:38:55 PM By: Enedina Finner RCP, RRT, CHT ?Entered By: Enedina Finner on 07/21/2021 08:29:34 ?Dennis Hicks, Dennis F. ([id number]) ?-------------------------------------------------------------------------------- ?Encounter Discharge Information Details ?Patient Name: Dennis Hicks, Dennis Hicks ?Date of Service: 07/21/2021 8:00 AM ?Medical Record Number: [id number] ?Patient Account Number: [id number] ?Date of Birth/Sex: [birth year] (66 y.o. M) ?Treating RN: Cornell Barman ?Primary Care Shakeila Pfarr: Diona Browner, Amy ?Other Clinician: Jacqulyn Bath ?Referring Kashawn Manzano: Diona Browner, Amy ?Treating Maida Widger/Extender: Kalman Shan ?Weeks in Treatment: 3 ?Encounter Discharge Information Items ?Discharge Condition:  Stable ?Ambulatory Status: Ambulatory ?Discharge Destination: Home ?Transportation: Private Auto ?Accompanied By: self ?Schedule Follow-up Appointment: Yes ?Clinical Summary of Care: ?Notes ?Patient has an HBO treatment scheduled on 07/22/21 at 08:00 am. ?Electronic Signature(s) ?Signed: 07/21/2021 2:38:55 PM By: Enedina Finner RCP, RRT, CHT ?Entered By: Enedina Finner on 07/21/2021 12:09:59 ?Dennis Hicks, Dennis F. ([id number]) ?-------------------------------------------------------------------------------- ?Vitals Details ?Patient Name: Dennis Hicks, Dennis Hicks ?Date of Service: 07/21/2021 8:00 AM ?Medical Record Number: [id number] ?Patient Account Number: [id number] ?Date of Birth/Sex: 06-05-1955 (66 y.o. M) ?Treating RN: Cornell Barman ?Primary Care Charlsie Fleeger: Diona Browner, Amy ?Other Clinician: Jacqulyn Bath ?Referring Takia Runyon: Diona Browner, Amy ?Treating Markel Kurtenbach/Extender: Kalman Shan ?Weeks in Treatment: 3 ?Vital Signs ?Time Taken: 08:05 ?Temperature (??F): 98.0 ?Height (in): 68 ?Pulse (bpm): 72 ?Weight (lbs): 185 ?Respiratory Rate (breaths/min): 16 ?Body Mass Index (BMI): 28.1 ?Blood Pressure (mmHg): 110/72 ?Capillary Blood Glucose (mg/dl): 160 ?Reference Range: 80 - 120 mg / dl ?Electronic Signature(s) ?Signed: 07/21/2021 2:38:55 PM By: Enedina Finner RCP, RRT, CHT ?Entered By: Enedina Finner on 07/21/2021 08:30:10 ?

## 2021-07-22 ENCOUNTER — Encounter: Payer: 59 | Admitting: Physician Assistant

## 2021-07-22 DIAGNOSIS — N3041 Irradiation cystitis with hematuria: Secondary | ICD-10-CM | POA: Diagnosis not present

## 2021-07-22 LAB — GLUCOSE, CAPILLARY
Glucose-Capillary: 165 mg/dL — ABNORMAL HIGH (ref 70–99)
Glucose-Capillary: 125 mg/dL — ABNORMAL HIGH (ref 70–99)

## 2021-07-22 NOTE — Progress Notes (Signed)
MORTY, ORTWEIN ([id number]) ?Visit Report for 07/22/2021 ?Arrival Information Details ?Patient Name: Dennis Hicks, Dennis Hicks ?Date of Service: 07/22/2021 8:00 AM ?Medical Record Number: [id number] ?Patient Account Number: [id number] ?Date of Birth/Sex: 12/02/1955 (66 y.o. M) ?Treating RN: Cornell Barman ?Primary Care Kingjames Coury: Diona Browner, Amy ?Other Clinician: Jacqulyn Bath ?Referring Branda Chaudhary: Diona Browner, Amy ?Treating Cebert Dettmann/Extender: Jeri Cos ?Weeks in Treatment: 3 ?Visit Information History Since Last Visit ?Added or deleted any medications: No ?Patient Arrived: Ambulatory ?Any new allergies or adverse reactions: No ?Arrival Time: 07:55 ?Had a fall or experienced change in No ?Accompanied By: self ?activities of daily living that may affect ?Transfer Assistance: None ?risk of falls: ?Patient Identification Verified: Yes ?Signs or symptoms of abuse/neglect since last visito No ?Secondary Verification Process Completed: Yes ?Hospitalized since last visit: No ?Patient Requires Transmission-Based Precautions: No ?Implantable device outside of the clinic excluding No ?Patient Has Alerts: No ?cellular tissue based products placed in the center ?since last visit: ?Pain Present Now: No ?Electronic Signature(s) ?Signed: 07/22/2021 1:10:16 PM By: Enedina Finner RCP, RRT, CHT ?Entered By: Enedina Finner on 07/22/2021 08:26:43 ?Dennis Hicks, Dennis F. ([id number]) ?-------------------------------------------------------------------------------- ?Encounter Discharge Information Details ?Patient Name: Dennis Hicks, Dennis Hicks ?Date of Service: 07/22/2021 8:00 AM ?Medical Record Number: [id number] ?Patient Account Number: [id number] ?Date of Birth/Sex: 10/27/1955 (66 y.o. M) ?Treating RN: Cornell Barman ?Primary Care Lamya Lausch: Diona Browner, Amy ?Other Clinician: Jacqulyn Bath ?Referring Ellia Knowlton: Diona Browner, Amy ?Treating Kahlia Lagunes/Extender: Jeri Cos ?Weeks in Treatment: 3 ?Encounter Discharge Information Items ?Discharge Condition: Stable ?Ambulatory Status:  Ambulatory ?Discharge Destination: Home ?Transportation: Private Auto ?Accompanied By: self ?Schedule Follow-up Appointment: Yes ?Clinical Summary of Care: ?Notes ?Patient has an HBO treatment scheduled on 07/23/21 at 08:00 am. ?Electronic Signature(s) ?Signed: 07/22/2021 1:10:16 PM By: Enedina Finner RCP, RRT, CHT ?Entered By: Enedina Finner on 07/22/2021 11:25:41 ?Dennis Hicks, Dennis F. ([id number]) ?-------------------------------------------------------------------------------- ?Vitals Details ?Patient Name: Dennis Hicks, Dennis Hicks ?Date of Service: 07/22/2021 8:00 AM ?Medical Record Number: [id number] ?Patient Account Number: [id number] ?Date of Birth/Sex: Nov 17, 1955 (66 y.o. M) ?Treating RN: Cornell Barman ?Primary Care Tanisha Lutes: Diona Browner, Amy ?Other Clinician: Jacqulyn Bath ?Referring Seaver Machia: Diona Browner, Amy ?Treating Electra Paladino/Extender: Jeri Cos ?Weeks in Treatment: 3 ?Vital Signs ?Time Taken: 08:05 ?Temperature (??F): 98.2 ?Height (in): 68 ?Pulse (bpm): 72 ?Weight (lbs): 185 ?Respiratory Rate (breaths/min): 16 ?Body Mass Index (BMI): 28.1 ?Blood Pressure (mmHg): 112/72 ?Capillary Blood Glucose (mg/dl): 165 ?Reference Range: 80 - 120 mg / dl ?Electronic Signature(s) ?Signed: 07/22/2021 1:10:16 PM By: Enedina Finner RCP, RRT, CHT ?Entered By: Enedina Finner on 07/22/2021 08:27:29 ?

## 2021-07-23 ENCOUNTER — Encounter: Payer: 59 | Admitting: Physician Assistant

## 2021-07-23 DIAGNOSIS — N3041 Irradiation cystitis with hematuria: Secondary | ICD-10-CM | POA: Diagnosis not present

## 2021-07-23 LAB — GLUCOSE, CAPILLARY
Glucose-Capillary: 175 mg/dL — ABNORMAL HIGH (ref 70–99)
Glucose-Capillary: 116 mg/dL — ABNORMAL HIGH (ref 70–99)

## 2021-07-23 NOTE — Progress Notes (Signed)
WITT, PLITT ([id number]) ?Visit Report for 07/23/2021 ?Problem List Details ?Patient Name: Dennis Hicks, Dennis Hicks ?Date of Service: 07/23/2021 8:00 AM ?Medical Record Number: [id number] ?Patient Account Number: [id number] ?Date of Birth/Sex: [birth year] (66 y.o. M) ?Treating RN: Cornell Barman ?Primary Care Provider: Diona Browner, Amy ?Other Clinician: Jacqulyn Bath ?Referring Provider: Diona Browner, Amy ?Treating Provider/Extender: Jeri Cos ?Weeks in Treatment: 4 ?Active Problems ?ICD-10 ?Encounter ?Code Description Active Date MDM ?Diagnosis ?N30.41 Irradiation cystitis with hematuria 06/25/2021 No Yes ?N39.3 Stress incontinence (male) (male) 06/25/2021 No Yes ?Z85.46 Personal history of malignant neoplasm of prostate 06/25/2021 No Yes ?E11.69 Type 2 diabetes mellitus with other specified complication 05/25/2900 No Yes ?Inactive Problems ?Resolved Problems ?Electronic Signature(s) ?Signed: 07/23/2021 4:39:02 PM By: Worthy Keeler PA-C ?Entered By: Worthy Keeler on 07/23/2021 16:39:01 ?TYRIS, ELIOT F. ([id number]) ?-------------------------------------------------------------------------------- ?SuperBill Details ?Patient Name: Dennis Hicks, Dennis Hicks. ?Date of Service: 07/23/2021 ?Medical Record Number: [id number] ?Patient Account Number: [id number] ?Date of Birth/Sex: Aug 07, 1955 (66 y.o. M) ?Treating RN: Cornell Barman ?Primary Care Provider: Diona Browner, Amy ?Other Clinician: Jacqulyn Bath ?Referring Provider: Diona Browner, Amy ?Treating Provider/Extender: Jeri Cos ?Weeks in Treatment: 4 ?Diagnosis Coding ?ICD-10 Codes ?Code Description ?N30.41 Irradiation cystitis with hematuria ?N39.3 Stress incontinence (male) (male) ?Z85.46 Personal history of malignant neoplasm of prostate ?E11.69 Type 2 diabetes mellitus with other specified complication ?Facility Procedures ?CPT4 Code: 49753005 ?Description: (Facility Use Only) HBOT, full body chamber, 39mn ?Modifier: ?Quantity: 4 ?Physician Procedures ?CPT4 Code: 61102111?Description: 99183 - WC PHYS HYPERBARIC OXYGEN  THERAPY ?Modifier: ?Quantity: 1 ?CPT4 Code: ?Description: ICD-10 Diagnosis Description N30.41 Irradiation cystitis with hematuria Z85.46 Personal history of malignant neoplasm of prostate E11.69 Type 2 diabetes mellitus with other specified complication ?Modifier: ?Quantity: ?Electronic Signature(s) ?Signed: 07/23/2021 4:39:17 PM By: SWorthy KeelerPA-C ?Previous Signature: 07/23/2021 2:13:05 PM Version By: WEnedina FinnerRCP, RRT, CHT ?Entered By: SWorthy Keeleron 07/23/2021 16:39:17 ?

## 2021-07-23 NOTE — Progress Notes (Signed)
[name], [name] ([id number]) ?Visit Report for 07/22/2021 ?Problem List Details ?Patient Name: Dennis Hicks, Dennis Hicks ?Date of Service: 07/22/2021 8:00 AM ?Medical Record Number: [id number] ?Patient Account Number: [id number] ?Date of Birth/Sex: [birth year] (66 y.o. M) ?Treating RN: Cornell Barman ?Primary Care Provider: Diona Browner, Amy ?Other Clinician: Jacqulyn Bath ?Referring Provider: Diona Browner, Amy ?Treating Provider/Extender: Jeri Cos ?Weeks in Treatment: 3 ?Active Problems ?ICD-10 ?Encounter ?Code Description Active Date MDM ?Diagnosis ?N30.41 Irradiation cystitis with hematuria 06/25/2021 No Yes ?N39.3 Stress incontinence (male) (male) 06/25/2021 No Yes ?Z85.46 Personal history of malignant neoplasm of prostate 06/25/2021 No Yes ?E11.69 Type 2 diabetes mellitus with other specified complication 0/0/8676 No Yes ?Inactive Problems ?Resolved Problems ?Electronic Signature(s) ?Signed: 07/22/2021 5:49:52 PM By: Worthy Keeler PA-C ?Entered By: Worthy Keeler on 07/22/2021 17:49:52 ?[name], [name] ([id number]) ?-------------------------------------------------------------------------------- ?SuperBill Details ?Patient Name: Dennis Hicks, Dennis Hicks. ?Date of Service: 07/22/2021 ?Medical Record Number: [id number] ?Patient Account Number: [id number] ?Date of Birth/Sex: 09/18/1955 (66 y.o. M) ?Treating RN: Cornell Barman ?Primary Care Provider: Diona Browner, Amy ?Other Clinician: Jacqulyn Bath ?Referring Provider: Diona Browner, Amy ?Treating Provider/Extender: Jeri Cos ?Weeks in Treatment: 3 ?Diagnosis Coding ?ICD-10 Codes ?Code Description ?N30.41 Irradiation cystitis with hematuria ?N39.3 Stress incontinence (male) (male) ?Z85.46 Personal history of malignant neoplasm of prostate ?E11.69 Type 2 diabetes mellitus with other specified complication ?Facility Procedures ?CPT4 Code: 33825053 ?Description: (Facility Use Only) HBOT, full body chamber, 77mn ?Modifier: ?Quantity: 4 ?Physician Procedures ?CPT4 Code: 69767341?Description: 99183 - WC PHYS HYPERBARIC OXYGEN  THERAPY ?Modifier: ?Quantity: 1 ?CPT4 Code: ?Description: ICD-10 Diagnosis Description N30.41 Irradiation cystitis with hematuria Z85.46 Personal history of malignant neoplasm of prostate E11.69 Type 2 diabetes mellitus with other specified complication ?Modifier: ?Quantity: ?Electronic Signature(s) ?Signed: 07/22/2021 5:50:06 PM By: SWorthy KeelerPA-C ?Previous Signature: 07/22/2021 1:10:16 PM Version By: WEnedina FinnerRCP, RRT, CHT ?Entered By: SWorthy Keeleron 07/22/2021 17:50:06 ?

## 2021-07-23 NOTE — Progress Notes (Signed)
KIET, GEER ([id number]) ?Visit Report for 07/23/2021 ?HBO Details ?Patient Name: Dennis Hicks, Dennis Hicks ?Date of Service: 07/23/2021 8:00 AM ?Medical Record Number: [id number] ?Patient Account Number: [id number] ?Date of Birth/Sex: 1955/12/23 (66 y.o. 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 ?HBO Treatment Details ?Treatment Number: 15 ?Patient Type: Outpatient ?Chamber Type: Monoplace ?Chamber Serial #: [id number] ?Treatment Protocol: 2.5 ATA with 90 minutes oxygen, with two 5 minute air breaks ?Treatment Details ?Compression Rate Down: 1.5 psi / minute ?De-Compression Rate Up: 2.0 psi / minute ?Compress Tx Pressure Air breaks and breathing periods Decompress Decompress ?Begins Reached (leave unused spaces blank) Begins Ends ?Chamber Pressure (ATA) 1 2.5 2.5 2.5 2.5 2.5 - - 2.5 1 ?Clock Time (24 hr) 08:11 08:26 08:57 09:02 09:32 09:37 - - 10:07 10:18 ?Treatment Length: 127 (minutes) ?Treatment Segments: 4 ?Vital Signs ?Capillary Blood Glucose Reference Range: 80 - 120 mg / dl ?HBO Diabetic Blood Glucose Intervention Range: <131 mg/dl or >249 mg/dl ?Time Vitals Blood Respiratory Capillary Blood Glucose Pulse Action ?Type: ?Pulse: Temperature: ?Taken: ?Pressure: ?Rate: ?Glucose (mg/dl): ?Meter #: Oximetry (%) Taken: ?Pre 08:03 104/62 66 18 98.9 175 1 none per protocol ?Post 10:28 114/68 72 16 97.7 116 1 none per protocol ?Treatment Response ?Treatment Toleration: Well ?Treatment Completion ?Treatment Completed without Adverse Event ?Status: ?HBO Attestation ?I certify that I supervised this HBO treatment in accordance with ?Medicare guidelines. A trained emergency  response team is readily Yes ?available per hospital policies and procedures. ?Continue HBOT as ordered. Yes ?Electronic Signature(s) ?Signed: 07/23/2021 4:39:09 PM By: Worthy Keeler PA-C ?Previous Signature: 07/23/2021 2:13:05 PM Version By: Enedina Finner RCP, RRT, CHT ?Entered By: Worthy Keeler on 07/23/2021 16:39:09 ?DIVONTE, SENGER ([id number]) ?-------------------------------------------------------------------------------- ?HBO Safety Checklist Details ?Patient Name: Dennis Hicks ?Date of Service: 07/23/2021 8:00 AM ?Medical Record Number: [id number] ?Patient Account Number: [id number] ?Date of Birth/Sex: [birth year] (66 y.o. M) ?Treating RN: Cornell Barman ?Primary Care Vic Esco: Diona Browner, Amy ?Other Clinician: Jacqulyn Bath ?Referring Elgar Scoggins: Diona Browner, Amy ?Treating Orra Nolde/Extender: Jeri Cos ?Weeks in Treatment: 4 ?HBO Safety Checklist Items ?Safety Checklist ?Consent Form Signed ?Patient voided / foley secured and emptied ?When did you last eato 07/22/21 pm ?Last dose of injectable or oral agent 07/23/21 am ?Ostomy pouch emptied and vented if applicable ?NA ?All implantable devices assessed, documented and approved ?NA ?Intravenous access site secured and place ?NA ?Valuables secured ?Linens and cotton and cotton/polyester blend (less than 51% polyester) ?Personal oil-based products / skin lotions / body lotions removed ?Wigs or hairpieces removed ?NA ?Smoking or tobacco materials removed ?NA ?Books / newspapers / magazines / loose paper removed ?NA ?Cologne, aftershave, perfume and deodorant removed ?Jewelry removed (may wrap wedding band) ?Make-up removed ?NA ?Hair care products removed ?Battery operated devices (external) removed ?NA ?Heating patches and chemical warmers removed ?NA ?Titanium eyewear removed ?NA ?Nail polish cured greater than 10 hours ?NA ?Casting material cured greater than 10 hours ?NA ?Hearing aids removed ?NA ?Loose dentures or partials removed ?NA ?Prosthetics have been  removed ?NA ?Patient demonstrates correct use of air break device (if applicable) ?Patient concerns have been addressed ?Patient grounding bracelet on and cord attached to chamber ?Specifics for Inpatients (complete in addition to ?above) ?Medication sheet sent with patient ?Intravenous medications  needed or due during therapy sent with patient ?Drainage tubes (e.g. nasogastric tube or chest tube secured and vented) ?Endotracheal or Tracheotomy tube secured ?Cuff deflated of air and inflated with saline ?Airway suctioned ?Electronic Signature(s) ?Signed: 07/23/2021 2:13:05 PM By: Enedina Finner RCP, RRT, CHT ?Entered By: Enedina Finner on 07/23/2021 08:28:51 ?

## 2021-07-23 NOTE — Progress Notes (Signed)
BENECIO, KLUGER ([id number]) ?Visit Report for 07/23/2021 ?Arrival Information Details ?Patient Name: Dennis Hicks, Dennis Hicks ?Date of Service: 07/23/2021 8:00 AM ?Medical Record Number: [id number] ?Patient Account Number: [id number] ?Date of Birth/Sex: 05-30-1955 (66 y.o. M) ?Treating RN: Cornell Barman ?Primary Care Auron Tadros: Diona Browner, Amy ?Other Clinician: Jacqulyn Bath ?Referring Gessica Jawad: Diona Browner, Amy ?Treating Zitlaly Malson/Extender: Jeri Cos ?Weeks in Treatment: 4 ?Visit Information History Since Last Visit ?Added or deleted any medications: No ?Patient Arrived: Ambulatory ?Any new allergies or adverse reactions: No ?Arrival Time: 07:55 ?Had a fall or experienced change in No ?Accompanied By: self ?activities of daily living that may affect ?Transfer Assistance: None ?risk of falls: ?Patient Identification Verified: Yes ?Signs or symptoms of abuse/neglect since last visito No ?Secondary Verification Process Completed: Yes ?Hospitalized since last visit: No ?Patient Requires Transmission-Based Precautions: No ?Implantable device outside of the clinic excluding No ?Patient Has Alerts: No ?cellular tissue based products placed in the center ?since last visit: ?Pain Present Now: No ?Electronic Signature(s) ?Signed: 07/23/2021 2:13:05 PM By: Enedina Finner RCP, RRT, CHT ?Entered By: Enedina Finner on 07/23/2021 08:26:55 ?Dennis Hicks, Dennis F. ([id number]) ?-------------------------------------------------------------------------------- ?Encounter Discharge Information Details ?Patient Name: Dennis Hicks, Dennis Hicks ?Date of Service: 07/23/2021 8:00 AM ?Medical Record Number: [id number] ?Patient Account Number: [id number] ?Date of Birth/Sex: 1955/06/29 (66 y.o. M) ?Treating RN: Cornell Barman ?Primary Care Berkley Wrightsman: Diona Browner, Amy ?Other Clinician: Jacqulyn Bath ?Referring Onetha Gaffey: Diona Browner, Amy ?Treating Vallorie Niccoli/Extender: Jeri Cos ?Weeks in Treatment: 4 ?Encounter Discharge Information Items ?Discharge Condition: Stable ?Ambulatory Status:  Ambulatory ?Discharge Destination: Home ?Transportation: Private Auto ?Accompanied By: self ?Schedule Follow-up Appointment: Yes ?Clinical Summary of Care: ?Notes ?Patient has an HBO treatment scheduled on 07/26/21 at 08:00 am. ?Electronic Signature(s) ?Signed: 07/23/2021 2:13:05 PM By: Enedina Finner RCP, RRT, CHT ?Entered By: Enedina Finner on 07/23/2021 11:13:11 ?Dennis Hicks ([id number]) ?-------------------------------------------------------------------------------- ?Vitals Details ?Patient Name: Dennis Hicks, Dennis Hicks ?Date of Service: 07/23/2021 8:00 AM ?Medical Record Number: [id number] ?Patient Account Number: [id number] ?Date of Birth/Sex: Jul 27, 1955 (66 y.o. M) ?Treating RN: Cornell Barman ?Primary Care Nusaiba Guallpa: Diona Browner, Amy ?Other Clinician: Jacqulyn Bath ?Referring Anetria Harwick: Diona Browner, Amy ?Treating Layken Doenges/Extender: Jeri Cos ?Weeks in Treatment: 4 ?Vital Signs ?Time Taken: 08:03 ?Temperature (??F): 98.9 ?Height (in): 68 ?Pulse (bpm): 66 ?Weight (lbs): 185 ?Respiratory Rate (breaths/min): 18 ?Body Mass Index (BMI): 28.1 ?Blood Pressure (mmHg): 104/62 ?Capillary Blood Glucose (mg/dl): 175 ?Reference Range: 80 - 120 mg / dl ?Electronic Signature(s) ?Signed: 07/23/2021 2:13:05 PM By: Enedina Finner RCP, RRT, CHT ?Entered By: Enedina Finner on 07/23/2021 08:27:29 ?

## 2021-07-23 NOTE — Progress Notes (Signed)
ARMOUR, VILLANUEVA ([id number]) ?Visit Report for 07/22/2021 ?HBO Details ?Patient Name: Dennis Hicks, Dennis Hicks ?Date of Service: 07/22/2021 8:00 AM ?Medical Record Number: [id number] ?Patient Account Number: [id number] ?Date of Birth/Sex: 10-Oct-1955 (66 y.o. 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 ?HBO Treatment Details ?Treatment Number: 14 ?Patient Type: Outpatient ?Chamber Type: Monoplace ?Chamber Serial #: [id number] ?Treatment Protocol: 2.5 ATA with 90 minutes oxygen, with two 5 minute air breaks ?Treatment Details ?Compression Rate Down: 1.5 psi / minute ?De-Compression Rate Up: 2.0 psi / minute ?Compress Tx Pressure Air breaks and breathing periods Decompress Decompress ?Begins Reached (leave unused spaces blank) Begins Ends ?Chamber Pressure (ATA) 1 2.5 2.5 2.5 2.5 2.5 - - 2.5 1 ?Clock Time (24 hr) 08:09 08:24 08:55 09:00 09:30 09:35 - - 10:06 10:18 ?Treatment Length: 129 (minutes) ?Treatment Segments: 4 ?Vital Signs ?Capillary Blood Glucose Reference Range: 80 - 120 mg / dl ?HBO Diabetic Blood Glucose Intervention Range: <131 mg/dl or >249 mg/dl ?Time Vitals Blood Respiratory Capillary Blood Glucose Pulse Action ?Type: ?Pulse: Temperature: ?Taken: ?Pressure: ?Rate: ?Glucose (mg/dl): ?Meter #: Oximetry (%) Taken: ?Pre 08:05 112/72 72 16 98.2 165 1 none per protocol ?Post 10:26 104/66 60 18 97.8 125 1 none per protocol ?Treatment Response ?Treatment Toleration: Well ?Treatment Completion ?Treatment Completed without Adverse Event ?Status: ?HBO Attestation ?I certify that I supervised this HBO treatment in accordance with ?Medicare guidelines. A trained emergency  response team is readily Yes ?available per hospital policies and procedures. ?Continue HBOT as ordered. Yes ?Electronic Signature(s) ?Signed: 07/22/2021 5:50:01 PM By: Worthy Keeler PA-C ?Previous Signature: 07/22/2021 1:10:16 PM Version By: Enedina Finner RCP, RRT, CHT ?Entered By: Worthy Keeler on 07/22/2021 17:50:01 ?AUDRA, BELLARD ([id number]) ?-------------------------------------------------------------------------------- ?HBO Safety Checklist Details ?Patient Name: TRIG, MCBRYAR ?Date of Service: 07/22/2021 8:00 AM ?Medical Record Number: [id number] ?Patient Account Number: [id number] ?Date of Birth/Sex: 1955-05-21 (66 y.o. M) ?Treating RN: Cornell Barman ?Primary Care Caelan Atchley: Diona Browner, Amy ?Other Clinician: Jacqulyn Bath ?Referring Barbette Mcglaun: Diona Browner, Amy ?Treating Meggin Ola/Extender: Jeri Cos ?Weeks in Treatment: 3 ?HBO Safety Checklist Items ?Safety Checklist ?Consent Form Signed ?Patient voided / foley secured and emptied ?When did you last eato am ?Last dose of injectable or oral agent am ?Ostomy pouch emptied and vented if applicable ?NA ?All implantable devices assessed, documented and approved ?NA ?Intravenous access site secured and place ?NA ?Valuables secured ?Linens and cotton and cotton/polyester blend (less than 51% polyester) ?Personal oil-based products / skin lotions / body lotions removed ?Wigs or hairpieces removed ?NA ?Smoking or tobacco materials removed ?NA ?Books / newspapers / magazines / loose paper removed ?NA ?Cologne, aftershave, perfume and deodorant removed ?Jewelry removed (may wrap wedding band) ?Make-up removed ?NA ?Hair care products removed ?Battery operated devices (external) removed ?NA ?Heating patches and chemical warmers removed ?NA ?Titanium eyewear removed ?NA ?Nail polish cured greater than 10 hours ?NA ?Casting material cured greater than 10 hours ?NA ?Hearing aids removed ?NA ?Loose dentures or partials removed ?NA ?Prosthetics have been removed ?NA ?Patient  demonstrates correct use of air break device (if applicable) ?Patient concerns have been addressed ?Patient grounding bracelet on and cord attached to chamber ?Specifics for Inpatients (complete in addition to ?above) ?Medication sheet sent with patient ?Intravenous medications needed or  due during therapy sent with patient ?Drainage tubes (e.g. nasogastric tube or chest tube secured and vented) ?Endotracheal or Tracheotomy tube secured ?Cuff deflated of air and inflated with saline ?Airway suctioned ?Electronic Signature(s) ?Signed: 07/22/2021 1:10:16 PM By: Enedina Finner RCP, RRT, CHT ?Entered By: Enedina Finner on 07/22/2021 08:28:32 ?

## 2021-07-26 ENCOUNTER — Encounter: Payer: 59 | Admitting: Internal Medicine

## 2021-07-26 DIAGNOSIS — N3041 Irradiation cystitis with hematuria: Secondary | ICD-10-CM | POA: Diagnosis not present

## 2021-07-26 LAB — GLUCOSE, CAPILLARY
Glucose-Capillary: 147 mg/dL — ABNORMAL HIGH (ref 70–99)
Glucose-Capillary: 133 mg/dL — ABNORMAL HIGH (ref 70–99)

## 2021-07-26 NOTE — Progress Notes (Signed)
KARIM, AIELLO ([id number]) ?Visit Report for 07/26/2021 ?Arrival Information Details ?Patient Name: Dennis Hicks, Dennis Hicks ?Date of Service: 07/26/2021 8:00 AM ?Medical Record Number: [id number] ?Patient Account Number: [id number] ?Date of Birth/Sex: 24-Nov-1955 (66 y.o. M) ?Treating RN: Carlene Coria ?Primary Care Chassie Pennix: Diona Browner, Amy ?Other Clinician: Jacqulyn Bath ?Referring Jaquin Coy: Diona Browner, Amy ?Treating Giovanny Dugal/Extender: Ricard Dillon ?Weeks in Treatment: 4 ?Visit Information History Since Last Visit ?Added or deleted any medications: No ?Patient Arrived: Ambulatory ?Any new allergies or adverse reactions: No ?Arrival Time: 07:50 ?Had a fall or experienced change in No ?Accompanied By: self ?activities of daily living that may affect ?Transfer Assistance: None ?risk of falls: ?Patient Identification Verified: Yes ?Signs or symptoms of abuse/neglect since last visito No ?Secondary Verification Process Completed: Yes ?Hospitalized since last visit: No ?Patient Requires Transmission-Based Precautions: No ?Implantable device outside of the clinic excluding No ?Patient Has Alerts: No ?cellular tissue based products placed in the center ?since last visit: ?Pain Present Now: No ?Electronic Signature(s) ?Signed: 07/26/2021 4:43:48 PM By: Enedina Finner RCP, RRT, CHT ?Entered By: Enedina Finner on 07/26/2021 08:53:55 ?NILAN, IDDINGS F. ([id number]) ?-------------------------------------------------------------------------------- ?Encounter Discharge Information Details ?Patient Name: Dennis Hicks ?Date of Service: 07/26/2021 8:00 AM ?Medical Record Number: [id number] ?Patient Account Number: [id number] ?Date of Birth/Sex: March 24, 1955 (66 y.o. M) ?Treating RN: Carlene Coria ?Primary Care Orlin Kann: Diona Browner, Amy ?Other Clinician: Jacqulyn Bath ?Referring Aizlynn Digilio: Diona Browner, Amy ?Treating Devanee Pomplun/Extender: Ricard Dillon ?Weeks in Treatment: 4 ?Encounter Discharge Information Items ?Discharge Condition:  Stable ?Ambulatory Status: Ambulatory ?Discharge Destination: Home ?Transportation: Private Auto ?Accompanied By: self ?Schedule Follow-up Appointment: Yes ?Clinical Summary of Care: ?Notes ?Patient has an HBO treatment scheduled on 07/27/21 at 08:00 am. ?Electronic Signature(s) ?Signed: 07/26/2021 4:43:48 PM By: Enedina Finner RCP, RRT, CHT ?Entered By: Enedina Finner on 07/26/2021 13:48:14 ?ARJUN, HARD F. ([id number]) ?-------------------------------------------------------------------------------- ?Vitals Details ?Patient Name: Dennis Hicks ?Date of Service: 07/26/2021 8:00 AM ?Medical Record Number: [id number] ?Patient Account Number: [id number] ?Date of Birth/Sex: 20-Jan-1956 (66 y.o. M) ?Treating RN: Carlene Coria ?Primary Care Tayna Smethurst: Diona Browner, Amy ?Other Clinician: Jacqulyn Bath ?Referring Eylin Pontarelli: Diona Browner, Amy ?Treating Cashus Halterman/Extender: Ricard Dillon ?Weeks in Treatment: 4 ?Vital Signs ?Time Taken: 08:01 ?Temperature (??F): 98.6 ?Height (in): 68 ?Pulse (bpm): 66 ?Weight (lbs): 185 ?Respiratory Rate (breaths/min): 16 ?Body Mass Index (BMI): 28.1 ?Blood Pressure (mmHg): 104/58 ?Capillary Blood Glucose (mg/dl): 147 ?Reference Range: 80 - 120 mg / dl ?Electronic Signature(s) ?Signed: 07/26/2021 4:43:48 PM By: Enedina Finner RCP, RRT, CHT ?Entered By: Enedina Finner on 07/26/2021 08:54:38 ?

## 2021-07-27 ENCOUNTER — Encounter: Payer: 59 | Admitting: Physician Assistant

## 2021-07-27 DIAGNOSIS — N3041 Irradiation cystitis with hematuria: Secondary | ICD-10-CM | POA: Diagnosis not present

## 2021-07-27 LAB — GLUCOSE, CAPILLARY
Glucose-Capillary: 201 mg/dL — ABNORMAL HIGH (ref 70–99)
Glucose-Capillary: 243 mg/dL — ABNORMAL HIGH (ref 70–99)
Glucose-Capillary: 130 mg/dL — ABNORMAL HIGH (ref 70–99)

## 2021-07-27 NOTE — Progress Notes (Signed)
MAKSYM, PFIFFNER ([id number]) ?Visit Report for 07/27/2021 ?HBO Details ?Patient Name: Dennis Hicks, Dennis Hicks ?Date of Service: 07/27/2021 8:00 AM ?Medical Record Number: [id number] ?Patient Account Number: [id number] ?Date of Birth/Sex: 1956/02/01 (66 y.o. 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 ?HBO Treatment Details ?Treatment Number: 22 ?Patient Type: Outpatient ?Chamber Type: Monoplace ?Chamber Serial #: [id number] ?Treatment Protocol: 2.5 ATA with 90 minutes oxygen, with two 5 minute air breaks ?Treatment Details ?Compression Rate Down: 1.5 psi / minute ?De-Compression Rate Up: 2.0 psi / minute ?Compress Tx Pressure Air breaks and breathing periods Decompress Decompress ?Begins Reached (leave unused spaces blank) Begins Ends ?Chamber Pressure (ATA) 1 2.5 2.5 2.5 2.5 2.5 - - 2.5 1 ?Clock Time (24 hr) 08:10 08:25 08:55 09:00 09:30 09:35 - - 10:05 10:17 ?Treatment Length: 127 (minutes) ?Treatment Segments: 4 ?Vital Signs ?Capillary Blood Glucose Reference Range: 80 - 120 mg / dl ?HBO Diabetic Blood Glucose Intervention Range: <131 mg/dl or >249 mg/dl ?Time Vitals Blood Respiratory Capillary Blood Glucose Pulse Action ?Type: ?Pulse: Temperature: ?Taken: ?Pressure: ?Rate: ?Glucose (mg/dl): ?Meter #: Oximetry (%) Taken: ?Pre 08:01 112/74 66 16 98.6 201 1 none per protocol ?Post 10:24 122/70 66 16 97.7 130 1 none per protocol ?Treatment Response ?Treatment Toleration: Well ?Treatment Completion ?Treatment Completed without Adverse Event ?Status: ?HBO Attestation ?I certify that I supervised this HBO treatment in accordance with ?Medicare guidelines. A trained emergency  response team is readily Yes ?available per hospital policies and procedures. ?Continue HBOT as ordered. Yes ?Electronic Signature(s) ?Signed: 07/27/2021 5:20:06 PM By: Worthy Keeler PA-C ?Previous Signature: 07/27/2021 1:22:39 PM Version By: Enedina Finner RCP, RRT, CHT ?Entered By: Worthy Keeler on 07/27/2021 17:20:06 ?ZYKEE, AVAKIAN ([id number]) ?-------------------------------------------------------------------------------- ?HBO Safety Checklist Details ?Patient Name: Dennis Hicks, Dennis Hicks ?Date of Service: 07/27/2021 8:00 AM ?Medical Record Number: [id number] ?Patient Account Number: [id number] ?Date of Birth/Sex: 01/24/1956 (66 y.o. M) ?Treating RN: Cornell Barman ?Primary Care Beauden Tremont: Diona Browner, Amy ?Other Clinician: Jacqulyn Bath ?Referring Nikia Levels: Diona Browner, Amy ?Treating Catharina Pica/Extender: Jeri Cos ?Weeks in Treatment: 4 ?HBO Safety Checklist Items ?Safety Checklist ?Consent Form Signed ?Patient voided / foley secured and emptied ?When did you last eato am ?Last dose of injectable or oral agent am ?Ostomy pouch emptied and vented if applicable ?NA ?All implantable devices assessed, documented and approved ?NA ?Intravenous access site secured and place ?NA ?Valuables secured ?Linens and cotton and cotton/polyester blend (less than 51% polyester) ?Personal oil-based products / skin lotions / body lotions removed ?Wigs or hairpieces removed ?NA ?Smoking or tobacco materials removed ?NA ?Books / newspapers / magazines / loose paper removed ?NA ?Cologne, aftershave, perfume and deodorant removed ?Jewelry removed (may wrap wedding band) ?Make-up removed ?NA ?Hair care products removed ?Battery operated devices (external) removed ?NA ?Heating patches and chemical warmers removed ?NA ?Titanium eyewear removed ?NA ?Nail polish cured greater than 10 hours ?NA ?Casting material cured greater than 10 hours ?NA ?Hearing aids removed ?NA ?Loose dentures or partials removed ?NA ?Prosthetics have been removed ?NA ?Patient  demonstrates correct use of air break device (if applicable) ?Patient concerns have been addressed ?Patient grounding bracelet on and cord attached to chamber ?Specifics for Inpatients (complete in addition to ?above) ?Medication sheet sent with patient ?Intravenous medications needed or  due during therapy sent with patient ?Drainage tubes (e.g. nasogastric tube or chest tube secured and vented) ?Endotracheal or Tracheotomy tube secured ?Cuff deflated of air and inflated with saline ?Airway suctioned ?Electronic Signature(s) ?Signed: 07/27/2021 1:22:39 PM By: Enedina Finner RCP, RRT, CHT ?Entered By: Enedina Finner on 07/27/2021 08:20:00 ?

## 2021-07-27 NOTE — Progress Notes (Signed)
CHRISTOS, MIXSON ([id number]) ?Visit Report for 07/26/2021 ?HBO Details ?Patient Name: Dennis Hicks, Dennis Hicks ?Date of Service: 07/26/2021 8:00 AM ?Medical Record Number: [id number] ?Patient Account Number: [id number] ?Date of Birth/Sex: [birth year] (66 y.o. 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 ?HBO Treatment Details ?Treatment Number: 57 ?Patient Type: Outpatient ?Chamber Type: Monoplace ?Chamber Serial #: [id number] ?Treatment Protocol: 2.5 ATA with 90 minutes oxygen, with two 5 minute air breaks ?Treatment Details ?Compression Rate Down: 1.5 psi / minute ?De-Compression Rate Up: 2.0 psi / minute ?Compress Tx Pressure Air breaks and breathing periods Decompress Decompress ?Begins Reached (leave unused spaces blank) Begins Ends ?Chamber Pressure (ATA) 1 2.5 2.5 2.5 2.5 2.5 - - 2.5 1 ?Clock Time (24 hr) 08:07 08:21 08:52 08:57 09:27 09:32 - - 10:02 10:14 ?Treatment Length: 127 (minutes) ?Treatment Segments: 4 ?Vital Signs ?Capillary Blood Glucose Reference Range: 80 - 120 mg / dl ?HBO Diabetic Blood Glucose Intervention Range: <131 mg/dl or >249 mg/dl ?Time Vitals Blood Respiratory Capillary Blood Glucose Pulse Action ?Type: ?Pulse: Temperature: ?Taken: ?Pressure: ?Rate: ?Glucose (mg/dl): ?Meter #: Oximetry (%) Taken: ?Pre 08:01 104/58 66 16 98.6 147 1 none per protocol ?Post 10:23 112/70 60 16 98.4 133 1 none per protocol ?Treatment Response ?Treatment Toleration: Well ?Treatment Completion ?Treatment Completed without Adverse Event ?Status: ?Esmond Hinch Notes ?No concerns with treatment given ?HBO Attestation ?I certify that I supervised this HBO treatment in  accordance with ?Medicare guidelines. A trained emergency response team is readily Yes ?available per hospital policies and procedures. ?Continue HBOT as ordered. Yes ?Electronic Signature(s) ?Signed: 07/27/2021 6:42:33 AM By: Linton Ham MD ?Entered By: Linton Ham on 07/26/2021 16:26:00 ?[name] ([id number]) ?-------------------------------------------------------------------------------- ?HBO Safety Checklist Details ?Patient Name: [name], [name] ?Date of Service: 07/26/2021 8:00 AM ?Medical Record Number: [id number] ?Patient Account Number: [id number] ?Date of Birth/Sex: March 07, 1956 (66 y.o. M) ?Treating RN: Carlene Coria ?Primary Care Avaneesh Pepitone: Diona Browner, Amy ?Other Clinician: Jacqulyn Bath ?Referring Maurisa Tesmer: Diona Browner, Amy ?Treating Francenia Chimenti/Extender: Ricard Dillon ?Weeks in Treatment: 4 ?HBO Safety Checklist Items ?Safety Checklist ?Consent Form Signed ?Patient voided / foley secured and emptied ?When did you last eato am ?Last dose of injectable or oral agent am ?Ostomy pouch emptied and vented if applicable ?NA ?All implantable devices assessed, documented and approved ?NA ?Intravenous access site secured and place ?NA ?Valuables secured ?Linens and cotton and cotton/polyester blend (less than 51% polyester) ?Personal oil-based products / skin lotions / body lotions removed ?Wigs or hairpieces removed ?NA ?Smoking or tobacco materials removed ?NA ?Books / newspapers / magazines / loose paper removed ?NA ?Cologne, aftershave, perfume and deodorant removed ?Jewelry removed (may wrap wedding band) ?Make-up removed ?NA ?Hair care products removed ?Battery operated devices (external) removed ?NA ?Heating patches and chemical warmers removed ?NA ?Titanium eyewear removed ?NA ?Nail polish cured greater than 10 hours ?NA ?Casting material cured greater than 10 hours ?NA ?Hearing aids removed ?NA ?Loose dentures or partials removed ?NA ?Prosthetics have been removed ?NA ?Patient demonstrates correct use of  air break device (if applicable) ?Patient concerns have been addressed ?Patient grounding bracelet on and cord attached to chamber ?Specifics for Inpatients (complete in addition to ?above) ?Medication sheet sent with patient ?Intravenous medications needed or due during therapy sent with patient ?  Drainage tubes (e.g. nasogastric tube or chest tube secured and vented) ?Endotracheal or Tracheotomy tube secured ?Cuff deflated of air and inflated with saline ?Airway suctioned ?Electronic Signature(s) ?Signed: 07/26/2021 4:43:48 PM By: Enedina Finner RCP, RRT, CHT ?Entered By: Enedina Finner on 07/26/2021 09:01:40 ?

## 2021-07-27 NOTE — Telephone Encounter (Signed)
Lvm for pt to call back to schedule °

## 2021-07-27 NOTE — Progress Notes (Signed)
ABEM, SHADDIX ([id number]) ?Visit Report for 07/27/2021 ?Arrival Information Details ?Patient Name: Dennis Hicks, Dennis Hicks ?Date of Service: 07/27/2021 8:00 AM ?Medical Record Number: [id number] ?Patient Account Number: [id number] ?Date of Birth/Sex: Nov 19, 1955 (66 y.o. M) ?Treating RN: Cornell Barman ?Primary Care Riva Sesma: Diona Browner, Amy ?Other Clinician: Jacqulyn Bath ?Referring Cormick Moss: Diona Browner, Amy ?Treating Toma Erichsen/Extender: Jeri Cos ?Weeks in Treatment: 4 ?Visit Information History Since Last Visit ?Added or deleted any medications: No ?Patient Arrived: Ambulatory ?Any new allergies or adverse reactions: No ?Arrival Time: 07:50 ?Had a fall or experienced change in No ?Accompanied By: self ?activities of daily living that may affect ?Transfer Assistance: None ?risk of falls: ?Patient Identification Verified: Yes ?Signs or symptoms of abuse/neglect since last visito No ?Secondary Verification Process Completed: Yes ?Hospitalized since last visit: No ?Patient Requires Transmission-Based Precautions: No ?Implantable device outside of the clinic excluding No ?Patient Has Alerts: No ?cellular tissue based products placed in the center ?since last visit: ?Pain Present Now: No ?Electronic Signature(s) ?Signed: 07/27/2021 1:22:39 PM By: Enedina Finner RCP, RRT, CHT ?Entered By: Enedina Finner on 07/27/2021 08:17:41 ?Dennis Hicks, Dennis F. ([id number]) ?-------------------------------------------------------------------------------- ?Encounter Discharge Information Details ?Patient Name: Dennis Hicks, Dennis Hicks ?Date of Service: 07/27/2021 8:00 AM ?Medical Record Number: [id number] ?Patient Account Number: [id number] ?Date of Birth/Sex: 1955/07/10 (66 y.o. M) ?Treating RN: Cornell Barman ?Primary Care Analaya Hoey: Diona Browner, Amy ?Other Clinician: Jacqulyn Bath ?Referring Angela Platner: Diona Browner, Amy ?Treating Niaja Stickley/Extender: Jeri Cos ?Weeks in Treatment: 4 ?Encounter Discharge Information Items ?Discharge Condition: Stable ?Ambulatory Status:  Ambulatory ?Discharge Destination: Home ?Transportation: Private Auto ?Accompanied By: self ?Schedule Follow-up Appointment: Yes ?Clinical Summary of Care: ?Notes ?Patient has an HBO treatment scheduled on 07/28/21 at 08:00 am. ?Electronic Signature(s) ?Signed: 07/27/2021 1:22:39 PM By: Enedina Finner RCP, RRT, CHT ?Entered By: Enedina Finner on 07/27/2021 11:26:53 ?Dennis Hicks, Dennis F. ([id number]) ?-------------------------------------------------------------------------------- ?Vitals Details ?Patient Name: Dennis Hicks, Dennis Hicks ?Date of Service: 07/27/2021 8:00 AM ?Medical Record Number: [id number] ?Patient Account Number: [id number] ?Date of Birth/Sex: [birth year] (66 y.o. M) ?Treating RN: Cornell Barman ?Primary Care Gera Inboden: Diona Browner, Amy ?Other Clinician: Jacqulyn Bath ?Referring Lamaj Metoyer: Diona Browner, Amy ?Treating Quientin Jent/Extender: Jeri Cos ?Weeks in Treatment: 4 ?Vital Signs ?Time Taken: 08:01 ?Temperature (??F): 98.6 ?Height (in): 68 ?Pulse (bpm): 66 ?Weight (lbs): 185 ?Respiratory Rate (breaths/min): 16 ?Body Mass Index (BMI): 28.1 ?Blood Pressure (mmHg): 112/74 ?Capillary Blood Glucose (mg/dl): 201 ?Reference Range: 80 - 120 mg / dl ?Electronic Signature(s) ?Signed: 07/27/2021 1:22:39 PM By: Enedina Finner RCP, RRT, CHT ?Entered By: Enedina Finner on 07/27/2021 08:18:56 ?

## 2021-07-27 NOTE — Progress Notes (Signed)
CELSO, GRANJA ([id number]) ?Visit Report for 07/27/2021 ?Problem List Details ?Patient Name: Dennis Hicks, Dennis Hicks ?Date of Service: 07/27/2021 8:00 AM ?Medical Record Number: [id number] ?Patient Account Number: [id number] ?Date of Birth/Sex: 12/18/1955 (66 y.o. M) ?Treating RN: Cornell Barman ?Primary Care Provider: Diona Browner, Amy ?Other Clinician: Jacqulyn Bath ?Referring Provider: Diona Browner, Amy ?Treating Provider/Extender: Jeri Cos ?Weeks in Treatment: 4 ?Active Problems ?ICD-10 ?Encounter ?Code Description Active Date MDM ?Diagnosis ?N30.41 Irradiation cystitis with hematuria 06/25/2021 No Yes ?N39.3 Stress incontinence (male) (male) 06/25/2021 No Yes ?Z85.46 Personal history of malignant neoplasm of prostate 06/25/2021 No Yes ?E11.69 Type 2 diabetes mellitus with other specified complication 11/27/8336 No Yes ?Inactive Problems ?Resolved Problems ?Electronic Signature(s) ?Signed: 07/27/2021 5:19:59 PM By: Worthy Keeler PA-C ?Entered By: Worthy Keeler on 07/27/2021 17:19:59 ?Dennis Hicks, Dennis F. ([id number]) ?-------------------------------------------------------------------------------- ?SuperBill Details ?Patient Name: Dennis Hicks, Dennis Hicks. ?Date of Service: 07/27/2021 ?Medical Record Number: [id number] ?Patient Account Number: [id number] ?Date of Birth/Sex: [birth year] (65 y.o. M) ?Treating RN: Cornell Barman ?Primary Care Provider: Diona Browner, Amy ?Other Clinician: Jacqulyn Bath ?Referring Provider: Diona Browner, Amy ?Treating Provider/Extender: Jeri Cos ?Weeks in Treatment: 4 ?Diagnosis Coding ?ICD-10 Codes ?Code Description ?N30.41 Irradiation cystitis with hematuria ?N39.3 Stress incontinence (male) (male) ?Z85.46 Personal history of malignant neoplasm of prostate ?E11.69 Type 2 diabetes mellitus with other specified complication ?Facility Procedures ?CPT4 Code: 40973532 ?Description: (Facility Use Only) HBOT, full body chamber, 52mn ?Modifier: ?Quantity: 4 ?Physician Procedures ?CPT4 Code: 69924268?Description: 99183 - WC PHYS HYPERBARIC OXYGEN  THERAPY ?Modifier: ?Quantity: 1 ?CPT4 Code: ?Description: ICD-10 Diagnosis Description N30.41 Irradiation cystitis with hematuria Z85.46 Personal history of malignant neoplasm of prostate E11.69 Type 2 diabetes mellitus with other specified complication ?Modifier: ?Quantity: ?Electronic Signature(s) ?Signed: 07/27/2021 5:20:15 PM By: SWorthy KeelerPA-C ?Previous Signature: 07/27/2021 1:22:39 PM Version By: WEnedina FinnerRCP, RRT, CHT ?Entered By: SWorthy Keeleron 07/27/2021 17:20:14 ?

## 2021-07-28 ENCOUNTER — Encounter (HOSPITAL_BASED_OUTPATIENT_CLINIC_OR_DEPARTMENT_OTHER): Payer: 59 | Admitting: Internal Medicine

## 2021-07-28 ENCOUNTER — Telehealth: Payer: Self-pay

## 2021-07-28 DIAGNOSIS — Z8546 Personal history of malignant neoplasm of prostate: Secondary | ICD-10-CM | POA: Diagnosis not present

## 2021-07-28 DIAGNOSIS — N3041 Irradiation cystitis with hematuria: Secondary | ICD-10-CM | POA: Diagnosis not present

## 2021-07-28 DIAGNOSIS — E1169 Type 2 diabetes mellitus with other specified complication: Secondary | ICD-10-CM

## 2021-07-28 LAB — GLUCOSE, CAPILLARY
Glucose-Capillary: 202 mg/dL — ABNORMAL HIGH (ref 70–99)
Glucose-Capillary: 117 mg/dL — ABNORMAL HIGH (ref 70–99)
Glucose-Capillary: 106 mg/dL — ABNORMAL HIGH (ref 70–99)

## 2021-07-28 NOTE — Telephone Encounter (Signed)
I do not see where anyone has called him from this office.  He is past due for an appointment with Dr. Diona Browner to follow up on his Diabetes if and when he calls back.  ?

## 2021-07-28 NOTE — Progress Notes (Signed)
TANIA, PERROTT ([id number]) ?Visit Report for 07/28/2021 ?Arrival Information Details ?Patient Name: Dennis Hicks, Dennis Hicks ?Date of Service: 07/28/2021 8:00 AM ?Medical Record Number: [id number] ?Patient Account Number: [id number] ?Date of Birth/Sex: January 25, 1956 (66 y.o. M) ?Treating RN: Cornell Barman ?Primary Care Ervine Witucki: Diona Browner, Amy ?Other Clinician: Jacqulyn Bath ?Referring Siomara Burkel: Diona Browner, Amy ?Treating Maridel Pixler/Extender: Kalman Shan ?Weeks in Treatment: 4 ?Visit Information History Since Last Visit ?Added or deleted any medications: No ?Patient Arrived: Ambulatory ?Any new allergies or adverse reactions: No ?Arrival Time: 07:53 ?Had a fall or experienced change in No ?Accompanied By: self ?activities of daily living that may affect ?Transfer Assistance: None ?risk of falls: ?Patient Identification Verified: Yes ?Signs or symptoms of abuse/neglect since last visito No ?Secondary Verification Process Completed: Yes ?Hospitalized since last visit: No ?Patient Requires Transmission-Based Precautions: No ?Implantable device outside of the clinic excluding No ?Patient Has Alerts: No ?cellular tissue based products placed in the center ?since last visit: ?Pain Present Now: No ?Electronic Signature(s) ?Signed: 07/28/2021 1:56:57 PM By: Enedina Finner RCP, RRT, CHT ?Entered By: Enedina Finner on 07/28/2021 08:39:37 ?Dennis Hicks, Dennis F. ([id number]) ?-------------------------------------------------------------------------------- ?Encounter Discharge Information Details ?Patient Name: Dennis Hicks, Dennis Hicks ?Date of Service: 07/28/2021 8:00 AM ?Medical Record Number: [id number] ?Patient Account Number: [id number] ?Date of Birth/Sex: [birth year] (66 y.o. M) ?Treating RN: Cornell Barman ?Primary Care Brightyn Mozer: Diona Browner, Amy ?Other Clinician: Jacqulyn Bath ?Referring Jalesa Thien: Diona Browner, Amy ?Treating Meeyah Ovitt/Extender: Kalman Shan ?Weeks in Treatment: 4 ?Encounter Discharge Information Items ?Discharge Condition:  Stable ?Ambulatory Status: Ambulatory ?Discharge Destination: Home ?Transportation: Private Auto ?Accompanied By: self ?Schedule Follow-up Appointment: Yes ?Clinical Summary of Care: ?Notes ?Patient has an HBO treatment scheduled on 07/29/21 at 08:00 am. ?Electronic Signature(s) ?Signed: 07/28/2021 1:56:57 PM By: Enedina Finner RCP, RRT, CHT ?Entered By: Enedina Finner on 07/28/2021 11:33:15 ?Dennis Hicks, Dennis F. ([id number]) ?-------------------------------------------------------------------------------- ?Vitals Details ?Patient Name: Dennis Hicks, Dennis Hicks ?Date of Service: 07/28/2021 8:00 AM ?Medical Record Number: [id number] ?Patient Account Number: [id number] ?Date of Birth/Sex: 01/07/1956 (66 y.o. M) ?Treating RN: Cornell Barman ?Primary Care Enjoli Tidd: Diona Browner, Amy ?Other Clinician: Jacqulyn Bath ?Referring Abdurahman Rugg: Diona Browner, Amy ?Treating Javaeh Muscatello/Extender: Kalman Shan ?Weeks in Treatment: 4 ?Vital Signs ?Time Taken: 08:02 ?Temperature (??F): 98.6 ?Height (in): 68 ?Pulse (bpm): 66 ?Weight (lbs): 185 ?Respiratory Rate (breaths/min): 16 ?Body Mass Index (BMI): 28.1 ?Blood Pressure (mmHg): 120/68 ?Capillary Blood Glucose (mg/dl): 202 ?Reference Range: 80 - 120 mg / dl ?Electronic Signature(s) ?Signed: 07/28/2021 1:56:57 PM By: Enedina Finner RCP, RRT, CHT ?Entered By: Enedina Finner on 07/28/2021 08:29:13 ?

## 2021-07-28 NOTE — Telephone Encounter (Signed)
[facility] - Client ?Nonclinical Telephone Record  ?[facility]? ?Client [place] Night - Client ?Client Site [place] ?Provider Eliezer Lofts - MD ?Contact Type Call ?Who Is Calling Patient / Member / Family / Caregiver ?Caller Name Zyion Doxtater ?Caller Phone Number [phone] ?Call Type Message Only Information Provided ?Reason for Call Returning a Call from the Office ?Initial Comment Caller states he's returning a phone call from the office. ?Additional Comment Office hours were provided. ?Disp. Time Disposition Final User ?07/27/2021 5:02:47 PM General Information Provided Yes Abinacer, Verdis Frederickson ?Call Closed By: Clayburn Pert ?Transaction Date/Time: 07/27/2021 5:00:34 PM (ET ?

## 2021-07-28 NOTE — Telephone Encounter (Signed)
Unable to reach pt by phone; sending note to [facility]. ?

## 2021-07-28 NOTE — Telephone Encounter (Signed)
Spoke with Dennis Hicks and got him scheduled for his CPE with fasting labs prior with Dr. Diona Browner.  ?

## 2021-07-28 NOTE — Progress Notes (Signed)
CAINE, BARFIELD ([id number]) ?Visit Report for 07/28/2021 ?HBO Details ?Patient Name: Dennis Hicks, Dennis Hicks ?Date of Service: 07/28/2021 8:00 AM ?Medical Record Number: [id number] ?Patient Account Number: [id number] ?Date of Birth/Sex: [birth year] (66 y.o. 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 ?HBO Treatment Details ?Treatment Number: 90 ?Patient Type: Outpatient ?Chamber Type: Monoplace ?Chamber Serial #: [id number] ?Treatment Protocol: 2.5 ATA with 90 minutes oxygen, with two 5 minute air breaks ?Treatment Details ?Compression Rate Down: 1.5 psi / minute ?De-Compression Rate Up: 2.0 psi / minute ?Compress Tx Pressure Air breaks and breathing periods Decompress Decompress ?Begins Reached (leave unused spaces blank) Begins Ends ?Chamber Pressure (ATA) 1 2.5 2.5 2.5 2.5 2.5 - - 2.5 1 ?Clock Time (24 hr) 08:26 08:41 09:12 09:17 09:47 09:52 - - 10:13 10:24 ?Treatment Length: 118 (minutes) ?Treatment Segments: 4 ?Vital Signs ?Capillary Blood Glucose Reference Range: 80 - 120 mg / dl ?HBO Diabetic Blood Glucose Intervention Range: <131 mg/dl or >249 mg/dl ?Time Vitals Blood Respiratory Capillary Blood Glucose Pulse Action ?Type: ?Pulse: Temperature: ?Taken: ?Pressure: ?Rate: ?Glucose (mg/dl): ?Meter #: Oximetry (%) Taken: ?Pre 08:02 120/68 66 16 98.6 202 1 none per protocol ?Post 10:33 122/82 60 16 97.7 106 1 none per protocol ?Treatment Response ?Treatment Toleration: Well ?Adverse Events: ?1:Other - patient had stomach cramps and asked to be removed ten minutes early ?Treatment Completion ?Treatment Completed with Adverse Event ?Status: ?Treatment Notes ?Treatment was  cut short by ten minutes as the patient was having stomach issues. ?HBO Attestation ?I certify that I supervised this HBO treatment in accordance with ?Medicare guidelines. A trained emergency response team is readily Yes ?available per hospital policies and procedures. ?Continue HBOT as ordered. Yes ?Electronic Signature(s) ?Signed: 07/28/2021 3:02:18 PM By: Kalman Shan DO ?Previous Signature: 07/28/2021 1:56:57 PM Version By: Enedina Finner RCP, RRT, CHT ?Entered By: Kalman Shan on 07/28/2021 15:01:19 ?ANTERRIO, MCCLEERY ([id number]) ?-------------------------------------------------------------------------------- ?HBO Safety Checklist Details ?Patient Name: Dennis Hicks ?Date of Service: 07/28/2021 8:00 AM ?Medical Record Number: [id number] ?Patient Account Number: [id number] ?Date of Birth/Sex: 1955/05/07 (66 y.o. M) ?Treating RN: Cornell Barman ?Primary Care Atleigh Gruen: Diona Browner, Amy ?Other Clinician: Jacqulyn Bath ?Referring Tamalyn Wadsworth: Diona Browner, Amy ?Treating Delita Chiquito/Extender: Kalman Shan ?Weeks in Treatment: 4 ?HBO Safety Checklist Items ?Safety Checklist ?Consent Form Signed ?Patient voided / foley secured and emptied ?When did you last eato am ?Last dose of injectable or oral agent am ?Ostomy pouch emptied and vented if applicable ?NA ?All implantable devices assessed, documented and approved ?NA ?Intravenous access site secured and place ?NA ?Valuables secured ?Linens and cotton and cotton/polyester blend (less than 51% polyester) ?Personal oil-based products / skin lotions / body lotions removed ?Wigs or hairpieces removed ?NA ?Smoking or tobacco materials removed ?NA ?Books / newspapers / magazines / loose paper removed ?NA ?Cologne, aftershave, perfume and deodorant removed ?Jewelry removed (may wrap wedding band) ?Make-up removed ?NA ?Hair care products removed ?Battery operated devices (external) removed ?NA ?Heating patches and chemical warmers removed ?NA ?Titanium eyewear  removed ?NA ?Nail polish cured greater than 10 hours ?NA ?Casting material cured greater than 10 hours ?NA ?Hearing aids removed ?NA ?Loose dentures or partials removed ?NA ?Prosthetics have been removed ?NA ?Patient demonstrates correct use of air break device (if applicable) ?Patient  concerns have been addressed ?Patient grounding bracelet on and cord attached to chamber ?Specifics for Inpatients (complete in addition to ?above) ?Medication sheet sent with patient ?Intravenous medications needed or due during therapy sent with patient ?Drainage tubes (e.g. nasogastric tube or chest tube secured and vented) ?Endotracheal or Tracheotomy tube secured ?Cuff deflated of air and inflated with saline ?Airway suctioned ?Electronic Signature(s) ?Signed: 07/28/2021 1:56:57 PM By: Enedina Finner RCP, RRT, CHT ?Entered By: Enedina Finner on 07/28/2021 08:30:07 ?

## 2021-07-29 ENCOUNTER — Encounter: Payer: 59 | Admitting: Physician Assistant

## 2021-07-29 DIAGNOSIS — N3041 Irradiation cystitis with hematuria: Secondary | ICD-10-CM | POA: Diagnosis not present

## 2021-07-29 LAB — GLUCOSE, CAPILLARY
Glucose-Capillary: 215 mg/dL — ABNORMAL HIGH (ref 70–99)
Glucose-Capillary: 122 mg/dL — ABNORMAL HIGH (ref 70–99)

## 2021-07-29 NOTE — Progress Notes (Signed)
COLONEL, KRAUSER ([id number]) ?Visit Report for 07/29/2021 ?Arrival Information Details ?Patient Name: Dennis Hicks, Dennis Hicks ?Date of Service: 07/29/2021 8:00 AM ?Medical Record Number: [id number] ?Patient Account Number: [id number] ?Date of Birth/Sex: 04/14/1955 (66 y.o. M) ?Treating RN: Cornell Barman ?Primary Care Rohini Jaroszewski: Diona Browner, Amy ?Other Clinician: Jacqulyn Bath ?Referring Pope Brunty: Diona Browner, Amy ?Treating Kinsly Hild/Extender: Jeri Cos ?Weeks in Treatment: 4 ?Visit Information History Since Last Visit ?Added or deleted any medications: No ?Patient Arrived: Ambulatory ?Any new allergies or adverse reactions: No ?Arrival Time: 07:55 ?Had a fall or experienced change in No ?Accompanied By: self ?activities of daily living that may affect ?Transfer Assistance: None ?risk of falls: ?Patient Identification Verified: Yes ?Signs or symptoms of abuse/neglect since last visito No ?Secondary Verification Process Completed: Yes ?Hospitalized since last visit: No ?Patient Requires Transmission-Based Precautions: No ?Implantable device outside of the clinic excluding No ?Patient Has Alerts: No ?cellular tissue based products placed in the center ?since last visit: ?Pain Present Now: No ?Electronic Signature(s) ?Signed: 07/29/2021 11:43:16 AM By: Enedina Finner RCP, RRT, CHT ?Entered By: Enedina Finner on 07/29/2021 08:54:22 ?KETHAN, PAPADOPOULOS F. ([id number]) ?-------------------------------------------------------------------------------- ?Encounter Discharge Information Details ?Patient Name: Dennis Hicks ?Date of Service: 07/29/2021 8:00 AM ?Medical Record Number: [id number] ?Patient Account Number: [id number] ?Date of Birth/Sex: 08-07-1955 (66 y.o. M) ?Treating RN: Cornell Barman ?Primary Care Lamae Fosco: Diona Browner, Amy ?Other Clinician: Jacqulyn Bath ?Referring Kemesha Mosey: Diona Browner, Amy ?Treating Ursala Cressy/Extender: Jeri Cos ?Weeks in Treatment: 4 ?Encounter Discharge Information Items ?Discharge Condition: Stable ?Ambulatory  Status: Ambulatory ?Discharge Destination: Home ?Transportation: Private Auto ?Accompanied By: self ?Schedule Follow-up Appointment: Yes ?Clinical Summary of Care: ?Notes ?Patient has an HBO treatment scheduled on 07/30/21 at 08:00 am. ?Electronic Signature(s) ?Signed: 07/29/2021 11:43:16 AM By: Enedina Finner RCP, RRT, CHT ?Entered By: Enedina Finner on 07/29/2021 11:42:53 ?ERICO, STAN F. ([id number]) ?-------------------------------------------------------------------------------- ?Vitals Details ?Patient Name: Dennis Hicks ?Date of Service: 07/29/2021 8:00 AM ?Medical Record Number: [id number] ?Patient Account Number: [id number] ?Date of Birth/Sex: 12/29/1955 (66 y.o. M) ?Treating RN: Cornell Barman ?Primary Care Duante Arocho: Diona Browner, Amy ?Other Clinician: Jacqulyn Bath ?Referring Shavette Shoaff: Diona Browner, Amy ?Treating Amelia Macken/Extender: Jeri Cos ?Weeks in Treatment: 4 ?Vital Signs ?Time Taken: 08:03 ?Temperature (??F): 98.5 ?Height (in): 68 ?Pulse (bpm): 66 ?Weight (lbs): 185 ?Respiratory Rate (breaths/min): 16 ?Body Mass Index (BMI): 28.1 ?Blood Pressure (mmHg): 120/78 ?Capillary Blood Glucose (mg/dl): 215 ?Reference Range: 80 - 120 mg / dl ?Electronic Signature(s) ?Signed: 07/29/2021 11:43:16 AM By: Enedina Finner RCP, RRT, CHT ?Entered By: Enedina Finner on 07/29/2021 08:56:37 ?

## 2021-07-30 ENCOUNTER — Encounter: Payer: 59 | Admitting: Physician Assistant

## 2021-07-30 DIAGNOSIS — N3041 Irradiation cystitis with hematuria: Secondary | ICD-10-CM | POA: Diagnosis not present

## 2021-07-30 LAB — GLUCOSE, CAPILLARY
Glucose-Capillary: 215 mg/dL — ABNORMAL HIGH (ref 70–99)
Glucose-Capillary: 121 mg/dL — ABNORMAL HIGH (ref 70–99)

## 2021-07-30 NOTE — Progress Notes (Addendum)
REYCE, LUBECK ([id number]) ?Visit Report for 07/30/2021 ?Arrival Information Details ?Patient Name: OPIE, MACLAUGHLIN ?Date of Service: 07/30/2021 8:00 AM ?Medical Record Number: [id number] ?Patient Account Number: [id number] ?Date of Birth/Sex: 1955-09-01 (67 y.o. M) ?Treating RN: Cornell Barman ?Primary Care Caelin Rosen: Diona Browner, Amy ?Other Clinician: Donavan Burnet ?Referring Jahrell Hamor: Diona Browner, Amy ?Treating Demarr Kluever/Extender: Jeri Cos ?Weeks in Treatment: 5 ?Visit Information History Since Last Visit ?All ordered tests and consults were completed: Yes ?Patient Arrived: Ambulatory ?Added or deleted any medications: No ?Arrival Time: 08:00 ?Any new allergies or adverse reactions: No ?Accompanied By: self ?Had a fall or experienced change in No ?Transfer Assistance: None ?activities of daily living that may affect ?Patient Identification Verified: Yes ?risk of falls: ?Secondary Verification Process Completed: Yes ?Signs or symptoms of abuse/neglect since last visito No ?Patient Requires Transmission-Based Precautions: No ?Hospitalized since last visit: No ?Patient Has Alerts: No ?Implantable device outside of the clinic excluding No ?cellular tissue based products placed in the center ?since last visit: ?Pain Present Now: No ?Electronic Signature(s) ?Signed: 07/30/2021 8:24:14 AM By: Donavan Burnet CHT, EMT, BS ?Previous Signature: 07/30/2021 8:20:38 AM Version By: Donavan Burnet CHT, EMT, BS ?Entered By: Donavan Burnet on 07/30/2021 08:24:13 ?AHMAD, VANWEY F. ([id number]) ?-------------------------------------------------------------------------------- ?Encounter Discharge Information Details ?Patient Name: MCKINNLEY, SMITHEY ?Date of Service: 07/30/2021 8:00 AM ?Medical Record Number: [id number] ?Patient Account Number: [id number] ?Date of Birth/Sex: [birth year] (66 y.o. M) ?Treating RN: Cornell Barman ?Primary Care Lekita Kerekes: Diona Browner, Amy ?Other Clinician: Donavan Burnet ?Referring Giuliana Handyside: Diona Browner, Amy ?Treating  Bona Hubbard/Extender: Jeri Cos ?Weeks in Treatment: 5 ?Encounter Discharge Information Items ?Discharge Condition: Stable ?Ambulatory Status: Ambulatory ?Discharge Destination: Home ?Transportation: Private Auto ?Accompanied By: self ?Schedule Follow-up Appointment: No ?Clinical Summary of Care: ?Electronic Signature(s) ?Signed: 07/30/2021 10:44:23 AM By: Donavan Burnet CHT, EMT, BS ?Entered By: Donavan Burnet on 07/30/2021 10:44:22 ?TERIN, CRAGLE F. ([id number]) ?-------------------------------------------------------------------------------- ?Vitals Details ?Patient Name: ZAYDEN, MAFFEI ?Date of Service: 07/30/2021 8:00 AM ?Medical Record Number: [id number] ?Patient Account Number: [id number] ?Date of Birth/Sex: 04-03-1955 (66 y.o. M) ?Treating RN: Cornell Barman ?Primary Care Derryl Uher: Diona Browner, Amy ?Other Clinician: Donavan Burnet ?Referring Alfa Leibensperger: Diona Browner, Amy ?Treating Alanmichael Barmore/Extender: Jeri Cos ?Weeks in Treatment: 5 ?Vital Signs ?Time Taken: 08:07 ?Temperature (??F): 98.7 ?Height (in): 68 ?Pulse (bpm): 78 ?Weight (lbs): 185 ?Respiratory Rate (breaths/min): 20 ?Body Mass Index (BMI): 28.1 ?Blood Pressure (mmHg): 112/62 ?Capillary Blood Glucose (mg/dl): 215 ?Reference Range: 80 - 120 mg / dl ?Electronic Signature(s) ?Signed: 07/30/2021 8:26:06 AM By: Donavan Burnet CHT, EMT, BS ?Entered By: Donavan Burnet on 07/30/2021 08:26:05 ?

## 2021-07-30 NOTE — Progress Notes (Signed)
LINDELL, RENFREW ([id number]) ?Visit Report for 07/29/2021 ?HBO Details ?Patient Name: Dennis Hicks, Dennis Hicks ?Date of Service: 07/29/2021 8:00 AM ?Medical Record Number: [id number] ?Patient Account Number: [id number] ?Date of Birth/Sex: [birth year] (66 y.o. 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 ?HBO Treatment Details ?Treatment Number: 67 ?Patient Type: Outpatient ?Chamber Type: Monoplace ?Chamber Serial #: [id number] ?Treatment Protocol: 2.5 ATA with 90 minutes oxygen, with two 5 minute air breaks ?Treatment Details ?Compression Rate Down: 1.5 psi / minute ?De-Compression Rate Up: 2.0 psi / minute ?Compress Tx Pressure Air breaks and breathing periods Decompress Decompress ?Begins Reached (leave unused spaces blank) Begins Ends ?Chamber Pressure (ATA) 1 2.5 2.5 2.5 2.5 2.5 - - 2.5 1 ?Clock Time (24 hr) 08:13 08:28 08:58 09:03 09:33 09:39 - - 10:09 10:20 ?Treatment Length: 127 (minutes) ?Treatment Segments: 4 ?Vital Signs ?Capillary Blood Glucose Reference Range: 80 - 120 mg / dl ?HBO Diabetic Blood Glucose Intervention Range: <131 mg/dl or >249 mg/dl ?Time Vitals Blood Respiratory Capillary Blood Glucose Pulse Action ?Type: ?Pulse: Temperature: ?Taken: ?Pressure: ?Rate: ?Glucose (mg/dl): ?Meter #: Oximetry (%) Taken: ?Pre 08:03 120/78 66 16 98.5 215 1 none per protocol ?Post 10:26 118/70 60 16 97.9 122 1 none per protocol ?Treatment Response ?Treatment Toleration: Well ?Treatment Completion ?Treatment Completed without Adverse Event ?Status: ?Electronic Signature(s) ?Signed: 07/29/2021 11:43:16 AM By: Enedina Finner RCP, RRT, CHT ?Signed: 07/30/2021 8:03:27 AM  By: Worthy Keeler PA-C ?Entered By: Enedina Finner on 07/29/2021 11:41:40 ?Dennis Hicks, Dennis Hicks ([id number]) ?-------------------------------------------------------------------------------- ?HBO Safety Checklist Details ?Patient Name: Dennis Hicks, Dennis Hicks ?Date of Service: 07/29/2021 8:00 AM ?Medical Record Number: [id number] ?Patient Account Number: [id number] ?Date of Birth/Sex: 1956/03/17 (66 y.o. M) ?Treating RN: Cornell Barman ?Primary Care [name]: Diona Browner, Amy ?Other Clinician: Jacqulyn Bath ?Referring [name]: Diona Browner, Amy ?Treating Iman Reinertsen/Extender: Jeri Cos ?Weeks in Treatment: 4 ?HBO Safety Checklist Items ?Safety Checklist ?Consent Form Signed ?Patient voided / foley secured and emptied ?When did you last eato am ?Last dose of injectable or oral agent am ?Ostomy pouch emptied and vented if applicable ?NA ?All implantable devices assessed, documented and approved ?NA ?Intravenous access site secured and place ?NA ?Valuables secured ?Linens and cotton and cotton/polyester blend (less than 51% polyester) ?Personal oil-based products / skin lotions / body lotions removed ?Wigs or hairpieces removed ?NA ?Smoking or tobacco materials removed ?NA ?Books / newspapers / magazines / loose paper removed ?NA ?Cologne, aftershave, perfume and deodorant removed ?Jewelry removed (may wrap wedding band) ?Make-up removed ?NA ?Hair care products removed ?Battery operated devices (external) removed ?NA ?Heating patches and chemical warmers removed ?NA ?Titanium eyewear removed ?NA ?Nail polish cured greater than 10 hours ?NA ?Casting material cured greater than 10 hours ?NA ?Hearing aids removed ?NA ?Loose dentures or partials removed ?NA ?Prosthetics have been removed ?NA ?Patient demonstrates correct use of air break device (if applicable) ?Patient concerns have been addressed ?Patient grounding bracelet on and cord attached to chamber ?Specifics for Inpatients (complete in addition to ?above) ?Medication sheet sent  with patient ?Intravenous medications needed or due during therapy sent with patient ?Drainage tubes (e.g. nasogastric tube or chest tube secured and vented) ?Endotracheal or Tracheotomy tube secured ?Cuff deflated of air and inflated with saline ?Airway suctioned ?Electronic Signature(s) ?Signed: 07/29/2021  11:43:16 AM By: Enedina Finner RCP, RRT, CHT ?Entered By: Enedina Finner on 07/29/2021 09:00:00 ?

## 2021-07-30 NOTE — Progress Notes (Addendum)
PAETON, STUDER ([id number]) ?Visit Report for 07/30/2021 ?HBO Details ?Patient Name: Dennis Hicks, Dennis Hicks ?Date of Service: 07/30/2021 8:00 AM ?Medical Record Number: [id number] ?Patient Account Number: [id number] ?Date of Birth/Sex: 17-Sep-1955 (66 y.o. 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 ?HBO Treatment Details ?Treatment Number: 20 ?Patient Type: Outpatient ?Chamber Type: Monoplace ?Chamber Serial #: [id number] ?Treatment Protocol: 2.5 ATA with 90 minutes oxygen, with two 5 minute air breaks ?Treatment Details ?Compression Rate Down: 1.5 psi / minute ?De-Compression Rate Up: 2.0 psi / minute ?Compress Tx Pressure Air breaks and breathing periods Decompress Decompress ?Begins Reached (leave unused spaces blank) Begins Ends ?Chamber Pressure (ATA) 1 2.5 2.5 2.5 2.5 2.5 - - 2.5 1 ?Clock Time (24 hr) 08:15 08:28 08:58 09:03 09:33 09:38 - - 10:08 10:22 ?Treatment Length: 127 (minutes) ?Treatment Segments: 4 ?Vital Signs ?Capillary Blood Glucose Reference Range: 80 - 120 mg / dl ?HBO Diabetic Blood Glucose Intervention Range: <131 mg/dl or >249 mg/dl ?Time Vitals Blood Respiratory Capillary Blood Glucose Pulse Action ?Type: ?Pulse: Temperature: ?Taken: ?Pressure: ?Rate: ?Glucose (mg/dl): ?Meter #: Oximetry (%) Taken: ?Pre 08:07 112/62 78 20 98.7 215 1 none per protocol ?Post 10:26 120/78 78 18 97.9 121 1 none per protocol ?Treatment Response ?Treatment Toleration: Well ?Treatment Completion ?Treatment Completed without Adverse Event ?Status: ?HBO Attestation ?I certify that I supervised this HBO treatment in accordance with ?Medicare guidelines. A trained emergency  response team is readily Yes ?available per hospital policies and procedures. ?Continue HBOT as ordered. Yes ?Electronic Signature(s) ?Signed: 07/30/2021 5:57:54 PM By: Worthy Keeler PA-C ?Previous Signature: 07/30/2021 10:43:12 AM Version By: Donavan Burnet CHT, EMT, BS ?Previous Signature: 07/30/2021 8:32:11 AM Version By: Donavan Burnet CHT, EMT, BS ?Entered By: Worthy Keeler on 07/30/2021 17:57:54 ?Dennis Hicks, Dennis Hicks ([id number]) ?-------------------------------------------------------------------------------- ?HBO Safety Checklist Details ?Patient Name: Dennis Hicks, Dennis Hicks ?Date of Service: 07/30/2021 8:00 AM ?Medical Record Number: [id number] ?Patient Account Number: [id number] ?Date of Birth/Sex: 1955-08-15 (66 y.o. M) ?Treating RN: Cornell Barman ?Primary Care Huy Majid: Diona Browner, Amy ?Other Clinician: Donavan Burnet ?Referring Cameren Earnest: Diona Browner, Amy ?Treating Carlie Corpus/Extender: Jeri Cos ?Weeks in Treatment: 5 ?HBO Safety Checklist Items ?Safety Checklist ?Consent Form Signed ?Patient voided / foley secured and emptied ?When did you last eato am ?Last dose of injectable or oral agent am ?Ostomy pouch emptied and vented if applicable ?NA ?All implantable devices assessed, documented and approved Artificial Urinary Sphincter ?Intravenous access site secured and place ?NA ?Valuables secured ?Linens and cotton and cotton/polyester blend (less than 51% polyester) ?Personal oil-based products / skin lotions / body lotions removed ?Wigs or hairpieces removed ?NA ?Smoking or tobacco materials removed ?NA ?Books / newspapers / magazines / loose paper removed ?Cologne, aftershave, perfume and deodorant removed ?Jewelry removed (may wrap wedding band) ?Make-up removed ?NA ?Hair care products removed ?Battery operated devices (external) removed ?Heating patches and chemical warmers removed ?Titanium eyewear removed ?NA ?Nail polish cured greater than 10 hours ?NA ?Casting material cured greater than 10 hours ?NA ?Hearing aids  removed ?NA ?Loose dentures or partials removed ?NA ?Prosthetics have been removed ?NA ?Patient demonstrates correct use of air break device (if applicable) ?Patient concerns have been addressed ?Patient grounding bracelet on and cord attached to chamber ?Specifics for Inpatients (complete in addition to ?  above) ?NA Medication sheet sent with patient ?Intravenous medications needed or due during therapy sent with patient ?NA ?Drainage tubes (e.g. nasogastric tube or chest tube secured and vented) ?NA ?Endotracheal or Tracheotomy tube secured ?NA ?Cuff deflated of air and inflated with saline ?NA ?Airway suctioned ?NA ?Notes ?Paper version used prior to treatment. ?Electronic Signature(s) ?Signed: 07/30/2021 8:27:50 AM By: Donavan Burnet CHT, EMT, BS ?Entered By: Donavan Burnet on 07/30/2021 08:27:50 ?

## 2021-07-30 NOTE — Progress Notes (Addendum)
MOUHAMED, GLASSCO ([id number]) Visit Report for 07/30/2021 Arrival Information Details Patient Name: Dennis Hicks, Dennis Hicks Date of Service: 07/30/2021 10:45 AM Medical Record Number: [id number] Patient Account Number: [id number] Date of Birth/Sex: Mar 25, 1955 (65 y.o. M) Treating RN: Cornell Barman Primary Care Erinn Huskins: Eliezer Lofts Other Clinician: Referring Seydou Hearns: Eliezer Lofts Treating Akshat Minehart/Extender: Skipper Cliche in Treatment: 5 Visit Information History Since Last Visit All ordered tests and consults were completed: No Patient Arrived: Ambulatory Added or deleted any medications: No Arrival Time: 10:38 Any new allergies or adverse reactions: No Accompanied By: self Had a fall or experienced change in No Transfer Assistance: None activities of daily living that may affect Patient Requires Transmission-Based Precautions: No risk of falls: Patient Has Alerts: No Signs or symptoms of abuse/neglect since last visito No Hospitalized since last visit: No Implantable device outside of the clinic excluding No cellular tissue based products placed in the center since last visit: Pain Present Now: No Electronic Signature(s) Signed: 07/30/2021 10:38:48 AM By: Worthy Keeler PA-C Entered By: Worthy Keeler on 07/30/2021 10:38:47 Dennis Hicks ([id number]) -------------------------------------------------------------------------------- Clinic Level of Care Assessment Details Patient Name: Dennis Hicks Date of Service: 07/30/2021 10:45 AM Medical Record Number: [id number] Patient Account Number: [id number] Date of Birth/Sex: 01/29/1956 (65 y.o. M) Treating RN: Cornell Barman Primary Care Sidrah Harden: Eliezer Lofts Other Clinician: Referring Dquan Cortopassi: Eliezer Lofts Treating Virgil Slinger/Extender: Skipper Cliche in Treatment: 5 Clinic Level of Care Assessment Items TOOL 4 Quantity Score X - Use when only an EandM is performed on FOLLOW-UP visit 1 0 ASSESSMENTS - Nursing Assessment /  Reassessment X - Reassessment of Co-morbidities (includes updates in patient status) 1 10 X- 1 5 Reassessment of Adherence to Treatment Plan ASSESSMENTS - Wound and Skin Assessment / Reassessment '[]'$  - Simple Wound Assessment / Reassessment - one wound 0 '[]'$  - 0 Complex Wound Assessment / Reassessment - multiple wounds '[]'$  - 0 Dermatologic / Skin Assessment (not related to wound area) ASSESSMENTS - Focused Assessment '[]'$  - Circumferential Edema Measurements - multi extremities 0 '[]'$  - 0 Nutritional Assessment / Counseling / Intervention '[]'$  - 0 Lower Extremity Assessment (monofilament, tuning fork, pulses) '[]'$  - 0 Peripheral Arterial Disease Assessment (using hand held doppler) ASSESSMENTS - Ostomy and/or Continence Assessment and Care '[]'$  - Incontinence Assessment and Management 0 '[]'$  - 0 Ostomy Care Assessment and Management (repouching, etc.) PROCESS - Coordination of Care X - Simple Patient / Family Education for ongoing care 1 15 X- 1 20 Complex (extensive) Patient / Family Education for ongoing care '[]'$  - 0 Staff obtains Programmer, systems, Records, Test Results / Process Orders '[]'$  - 0 Staff telephones HHA, Nursing Homes / Clarify orders / etc '[]'$  - 0 Routine Transfer to another Facility (non-emergent condition) '[]'$  - 0 Routine Hospital Admission (non-emergent condition) '[]'$  - 0 New Admissions / Biomedical engineer / Ordering NPWT, Apligraf, etc. '[]'$  - 0 Emergency Hospital Admission (emergent condition) X- 1 10 Simple Discharge Coordination '[]'$  - 0 Complex (extensive) Discharge Coordination PROCESS - Special Needs '[]'$  - Pediatric / Minor Patient Management 0 '[]'$  - 0 Isolation Patient Management '[]'$  - 0 Hearing / Language / Visual special needs '[]'$  - 0 Assessment of Community assistance (transportation, D/C planning, etc.) '[]'$  - 0 Additional assistance / Altered mentation '[]'$  - 0 Support Surface(s) Assessment (bed, cushion, seat, etc.) INTERVENTIONS - Wound Cleansing /  Measurement Dennis Hicks, Dennis F. ([id number]) '[]'$  - 0 Simple Wound Cleansing - one wound '[]'$  - 0 Complex Wound Cleansing - multiple wounds '[]'$  - 0 Wound  Imaging (photographs - any number of wounds) '[]'$  - 0 Wound Tracing (instead of photographs) '[]'$  - 0 Simple Wound Measurement - one wound '[]'$  - 0 Complex Wound Measurement - multiple wounds INTERVENTIONS - Wound Dressings '[]'$  - Small Wound Dressing one or multiple wounds 0 '[]'$  - 0 Medium Wound Dressing one or multiple wounds '[]'$  - 0 Large Wound Dressing one or multiple wounds '[]'$  - 0 Application of Medications - topical '[]'$  - 0 Application of Medications - injection INTERVENTIONS - Miscellaneous '[]'$  - External ear exam 0 '[]'$  - 0 Specimen Collection (cultures, biopsies, blood, body fluids, etc.) '[]'$  - 0 Specimen(s) / Culture(s) sent or taken to Lab for analysis '[]'$  - 0 Patient Transfer (multiple staff / Civil Service fast streamer / Similar devices) '[]'$  - 0 Simple Staple / Suture removal (25 or less) '[]'$  - 0 Complex Staple / Suture removal (26 or more) '[]'$  - 0 Hypo / Hyperglycemic Management (close monitor of Blood Glucose) '[]'$  - 0 Ankle / Brachial Index (ABI) - do not check if billed separately X- 1 5 Vital Signs Has the patient been seen at the hospital within the last three years: Yes Total Score: 65 Level Of Care: New/Established - Level 2 Electronic Signature(s) Signed: 07/30/2021 6:04:02 PM By: Worthy Keeler PA-C Entered By: Worthy Keeler on 07/30/2021 10:46:41 Dennis Hicks ([id number]) -------------------------------------------------------------------------------- Encounter Discharge Information Details Patient Name: Dennis Hicks Date of Service: 07/30/2021 10:45 AM Medical Record Number: [id number] Patient Account Number: [id number] Date of Birth/Sex: 1956-01-01 (65 y.o. M) Treating RN: Cornell Barman Primary Care Monisha Siebel: Eliezer Lofts Other Clinician: Referring Jawon Dipiero: Eliezer Lofts Treating Jazz Rogala/Extender: Skipper Cliche in  Treatment: 5 Encounter Discharge Information Items Discharge Condition: Stable Ambulatory Status: Ambulatory Discharge Destination: Home Transportation: Private Auto Accompanied By: self Schedule Follow-up Appointment: Yes Clinical Summary of Care: Patient Declined Electronic Signature(s) Signed: 07/30/2021 10:48:10 AM By: Worthy Keeler PA-C Entered By: Worthy Keeler on 07/30/2021 10:48:10 Dennis Hicks ([id number]) -------------------------------------------------------------------------------- Lower Extremity Assessment Details Patient Name: Dennis Hicks Date of Service: 07/30/2021 10:45 AM Medical Record Number: [id number] Patient Account Number: [id number] Date of Birth/Sex: 16-Oct-1955 (65 y.o. M) Treating RN: Donnamarie Poag Primary Care Akaylah Lalley: Eliezer Lofts Other Clinician: Referring Hazel Leveille: Eliezer Lofts Treating Delan Ksiazek/Extender: Jeri Cos Weeks in Treatment: 5 Electronic Signature(s) Signed: 07/30/2021 11:19:36 AM By: Donnamarie Poag Entered By: Donnamarie Poag on 07/30/2021 11:19:36 Dennis Hicks ([id number]) -------------------------------------------------------------------------------- Multi Wound Chart Details Patient Name: Dennis Hicks Date of Service: 07/30/2021 10:45 AM Medical Record Number: [id number] Patient Account Number: [id number] Date of Birth/Sex: Apr 19, 1955 (65 y.o. M) Treating RN: Cornell Barman Primary Care Addalynn Kumari: Eliezer Lofts Other Clinician: Referring Branko Steeves: Eliezer Lofts Treating Keiaira Donlan/Extender: Skipper Cliche in Treatment: 5 Vital Signs Height(in): 68 Capillary Blood Glucose 215 (mg/dl): Weight(lbs): 185 Pulse(bpm): 78 Body Mass Index(BMI): 28.1 Blood Pressure(mmHg): 112/62 Temperature(F): 98.7 Respiratory Rate(breaths/min): 20 Wound Assessments Treatment Notes Electronic Signature(s) Signed: 07/30/2021 10:44:26 AM By: Worthy Keeler PA-C Entered By: Worthy Keeler on 07/30/2021 10:44:26 Dennis Hicks  ([id number]) -------------------------------------------------------------------------------- Multi-Disciplinary Care Plan Details Patient Name: Dennis Hicks Date of Service: 07/30/2021 10:45 AM Medical Record Number: [id number] Patient Account Number: [id number] Date of Birth/Sex: December 14, 1955 (65 y.o. M) Treating RN: Cornell Barman Primary Care Tomas Schamp: Eliezer Lofts Other Clinician: Referring Payden Bonus: Eliezer Lofts Treating Trong Gosling/Extender: Skipper Cliche in Treatment: 5 Active Inactive Electronic Signature(s) Signed: 09/01/2021 4:51:18 PM By: Gretta Cool, BSN, RN, CWS, Kim RN, BSN Previous Signature: 07/30/2021 10:44:16 AM Version By: Worthy Keeler PA-C Previous  Signature: 08/04/2021 8:39:28 AM Version By: Gretta Cool, BSN, RN, CWS, Kim RN, BSN Entered By: Gretta Cool, BSN, RN, CWS, Kim on 09/01/2021 16:51:18 Dennis Hicks, Dennis Hicks ([id number]) -------------------------------------------------------------------------------- Pain Assessment Details Patient Name: Dennis Hicks Date of Service: 07/30/2021 10:45 AM Medical Record Number: [id number] Patient Account Number: [id number] Date of Birth/Sex: 01-15-1956 (65 y.o. M) Treating RN: Cornell Barman Primary Care Israella Hubert: Eliezer Lofts Other Clinician: Referring Deriana Vanderhoef: Eliezer Lofts Treating Zack Crager/Extender: Skipper Cliche in Treatment: 5 Active Problems Location of Pain Severity and Description of Pain Patient Has Paino No Site Locations Pain Management and Medication Current Pain Management: Electronic Signature(s) Signed: 07/30/2021 10:39:07 AM By: Worthy Keeler PA-C Signed: 08/04/2021 8:39:28 AM By: Gretta Cool, BSN, RN, CWS, Kim RN, BSN Entered By: Worthy Keeler on 07/30/2021 10:39:07 Dennis Hicks ([id number]) -------------------------------------------------------------------------------- Patient/Caregiver Education Details Patient Name: Dennis Hicks Date of Service: 07/30/2021 10:45 AM Medical Record Number: [id number] Patient Account Number:  [id number] Date of Birth/Gender: 03-14-1956 (65 y.o. M) Treating RN: Cornell Barman Primary Care Physician: Eliezer Lofts Other Clinician: Referring Physician: Eliezer Lofts Treating Physician/Extender: Skipper Cliche in Treatment: 5 Education Assessment Education Provided To: Patient Education Topics Provided Hyperbaric Oxygenation: Methods: Explain/Verbal Responses: State content correctly Electronic Signature(s) Signed: 07/30/2021 6:04:02 PM By: Worthy Keeler PA-C Entered By: Worthy Keeler on 07/30/2021 10:47:11 Dennis Hicks ([id number]) -------------------------------------------------------------------------------- Vitals Details Patient Name: Dennis Hicks Date of Service: 07/30/2021 10:45 AM Medical Record Number: [id number] Patient Account Number: [id number] Date of Birth/Sex: May 21, 1955 (65 y.o. M) Treating RN: Cornell Barman Primary Care Leatrice Parilla: Eliezer Lofts Other Clinician: Referring Uthman Mroczkowski: Eliezer Lofts Treating Gilbert Narain/Extender: Skipper Cliche in Treatment: 5 Vital Signs Time Taken: 08:07 Temperature (F): 98.7 Height (in): 68 Pulse (bpm): 78 Weight (lbs): 185 Respiratory Rate (breaths/min): 20 Body Mass Index (BMI): 28.1 Blood Pressure (mmHg): 112/62 Capillary Blood Glucose (mg/dl): 215 Reference Range: 80 - 120 mg / dl Electronic Signature(s) Signed: 07/30/2021 10:43:59 AM By: Worthy Keeler PA-C Entered By: Worthy Keeler on 07/30/2021 10:43:58

## 2021-07-30 NOTE — Progress Notes (Addendum)
TARANCE, BALAN ([id number]) ?Visit Report for 07/30/2021 ?Chief Complaint Document Details ?Patient Name: Dennis Hicks, Dennis Hicks ?Date of Service: 07/30/2021 10:45 AM ?Medical Record Number: [id number] ?Patient Account Number: [id number] ?Date of Birth/Sex: 1955-10-10 (66 y.o. M) ?Treating RN: Cornell Barman ?Primary Care Provider: Eliezer Lofts Other Clinician: ?Referring Provider: Eliezer Lofts ?Treating Provider/Extender: Jeri Cos ?Weeks in Treatment: 5 ?Information Obtained from: Patient ?Chief Complaint ?Radiation cystitis with hematuria ?Electronic Signature(s) ?Signed: 07/30/2021 5:55:31 PM By: Worthy Keeler PA-C ?Entered By: Worthy Keeler on 07/30/2021 17:55:30 ?DEAIRE, Dennis Hicks ([id number]) ?-------------------------------------------------------------------------------- ?HPI Details ?Patient Name: Dennis Hicks, Dennis Hicks ?Date of Service: 07/30/2021 10:45 AM ?Medical Record Number: [id number] ?Patient Account Number: [id number] ?Date of Birth/Sex: 02-Sep-1955 (66 y.o. M) ?Treating RN: Cornell Barman ?Primary Care Provider: Eliezer Lofts Other Clinician: ?Referring Provider: Eliezer Lofts ?Treating Provider/Extender: Jeri Cos ?Weeks in Treatment: 5 ?History of Present Illness ?HPI Description: 06-25-2021 upon evaluation today patient presents for initial inspection here in our clinic concerning evaluation for hyperbaric ?oxygen therapy. He is actually a gentleman who underwent treatment for prostate cancer. He is a referral from Dr. Alphonzo Severance at [facility] urology. He is ?CaP s/p RALP Dec 2019, EBRT 01/2019 with last PSA <0.01. He does have erectile dysfunction refractory to oral medication and unfortunately ?also has stress urinary incontinence which is quite severe. 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 The patient subsequently had 7600 cGy to  his prostatic fossa and local regional nodes over 7 weeks of treatment which was ?performed in the period of time from October 18, 2018 to completion by Dr. Baruch Gouty. Subsequently the patient has done well since but unfortunately ?the radiation damage has caused further complication for him in general. He is actually set to have a prosthetic sphincter implanted on ?07/02/2021. Subsequently however that is completely separate issue from the radiation cystitis that he is experiencing with hematuria. This should ?not interfere with this HBO therapy if indeed this is something that he proceeds forward with. The patient is a diabetic so he will have to go under ?the diabetic protocol although he does take metformin he actually does not really check his blood sugars on any type of regular schedule. ?07-30-2021 I am seeing the patient today for his 30-day eval to check in and see how things are doing. He actually appears to be doing excellent. ?He tells me that since beginning the HBO therapy he has not had any issues with ongoing or increased bleeding. He is extremely pleased with that ?he is also not having any discomfort as far as this is concerned. Overall I am extremely pleased with where we stand today. ?Electronic Signature(s) ?Signed: 07/30/2021 5:56:08 PM By: Worthy Keeler PA-C ?Entered By: Worthy Keeler on 07/30/2021 17:56:08 ?Dennis Hicks, Dennis F. ([id number]) ?-------------------------------------------------------------------------------- ?Physical Exam Details ?Patient Name: Dennis Hicks, Dennis Hicks ?Date of Service: 07/30/2021 10:45 AM ?Medical Record Number: [id number] ?Patient Account Number: [id number] ?Date of Birth/Sex: 1955-05-29 (66 y.o. M) ?Treating RN: Cornell Barman ?Primary Care Provider: Eliezer Lofts Other Clinician: ?Referring Provider: Eliezer Lofts ?Treating Provider/Extender: Jeri Cos ?Weeks in Treatment: 5 ?Constitutional ?Well-nourished and well-hydrated in no acute distress. ?Eyes ?conjunctiva clear no eyelid  edema noted. pupils equal round and reactive to light and accommodation. ?Respiratory ?normal breathing without difficulty. ?Psychiatric ?this patient is able to make decisions and demonstrates good insight into disease process. Alert and  Oriented x 3. pleasant and cooperative. ?Notes ?Again physically there is really nothing from a physical exam perspective to do the only issue is that he is having some problems with his eyesight ?obviously this is a side effect of HBO therapy where vision can be temporarily distorted. He just notes that looking at the TV he has been a little ?bit blurrier. Nonetheless this is something that will resolve once everything is ceased and he has a little bit of time for things to return back to ?normal from a pressure standpoint. ?Electronic Signature(s) ?Signed: 07/30/2021 5:56:39 PM By: Worthy Keeler PA-C ?Entered By: Worthy Keeler on 07/30/2021 17:56:39 ?Dennis Hicks, Dennis F. ([id number]) ?-------------------------------------------------------------------------------- ?Physician Orders Details ?Patient Name: Dennis Hicks, Dennis Hicks ?Date of Service: 07/30/2021 10:45 AM ?Medical Record Number: [id number] ?Patient Account Number: [id number] ?Date of Birth/Sex: December 03, 1955 (66 y.o. M) ?Treating RN: Cornell Barman ?Primary Care Provider: Eliezer Lofts Other Clinician: ?Referring Provider: Eliezer Lofts ?Treating Provider/Extender: Jeri Cos ?Weeks in Treatment: 5 ?Verbal / Phone Orders: No ?Diagnosis Coding ?Follow-up Appointments ?o Other: - continue HBO as directed till completion as patient is tolerating this well at this point and symptoms are improving ?Hyperbaric Oxygen Therapy ?o Evaluate for HBO Therapy ?o Indication and location: - radiation cystitis ?o If appropriate for treatment, begin HBOT per protocol: ?o 2.5 ATA for 90 Minutes with 2 Five (5) Minute Air Breaks ?o One treatment per day (delivered Monday through Friday unless otherwise specified in Special Instructions below): ?o  Total # of Treatments: - 40 ?o Antihistamine 30 minutes prior to HBO Treatment, difficulty clearing ears. ?o Finger stick Blood Glucose Pre- and Post- HBOT Treatment. ?o Follow Hyperbaric Oxygen Glycemia Protocol ?GLYCEMIA INTERVENTIONS PROTOCOL ?PRE-HBO GLYCEMIA INTERVENTIONS ?ACTION INTERVENTION ?Obtain pre-HBO capillary blood glucose (ensure ?1 ?physician order is in chart). ?A. Notify HBO physician and await physician ?orders. ?2 If result is 70 mg/dl or below: ?B. If the result meets the hospital definition ?of a critical result, follow hospital policy. ?A. Give patient an 8 ounce Glucerna Shake??, ?an 8 ounce Ensure??, or 8 ounces of a ?Glucerna/Ensure equivalent dietary ?supplement*. ?B. Wait 30 minutes. ?If result is 71 mg/dl to 130 mg/dl: C. Retest patientos capillary blood glucose ?(CBG). ?D. If result greater than or equal to 110 ?mg/dl, proceed with HBO. ?If result less than 110 mg/dl, notify HBO ?physician and consider holding HBO. ?If result is 131 mg/dl to 249 mg/dl: A. Proceed with HBO. ?A. Notify HBO physician and await physician ?orders. ?B. It is recommended to hold HBO and do ?If result is 250 mg/dl or greater: ?blood/urine ketone testing. ?C. If the result meets the hospital definition ?of a critical result, follow hospital policy. ?POST-HBO GLYCEMIA INTERVENTIONS ?ACTION INTERVENTION ?Obtain post HBO capillary blood glucose (ensure ?1 ?physician order is in chart). ?A. Notify HBO physician and await physician ?orders. ?2 If result is 70 mg/dl or below: ?B. If the result meets the hospital definition ?of a critical result, follow hospital policy. ?A. Give patient an 8 ounce Glucerna Shake??, ?an 8 ounce Ensure??, or 8 ounces of a ?Glucerna/Ensure equivalent dietary ?supplement*. ?B. Wait 15 minutes for symptoms of ?hypoglycemia (i.e. nervousness, anxiety, ?If result is 71 mg/dl to 100 mg/dl: sweating, chills, clamminess, irritability, ?confusion, tachycardia or dizziness). ?C. If patient  asymptomatic, discharge ?patient. ?If patient symptomatic, repeat capillary ?blood glucose (CBG) and notify HBO ?physician. Dennis Hicks, Dennis Hicks ([id number]) ?If result is 101 mg/dl to 249 mg/dl: A. Discharge pati

## 2021-07-31 NOTE — Progress Notes (Signed)
TRAYSON, STITELY ([id number]) ?Visit Report for 07/30/2021 ?Problem List Details ?Patient Name: Dennis Hicks, Dennis Hicks ?Date of Service: 07/30/2021 8:00 AM ?Medical Record Number: [id number] ?Patient Account Number: [id number] ?Date of Birth/Sex: November 25, 1955 (66 y.o. M) ?Treating RN: Cornell Barman ?Primary Care Provider: Diona Browner, Amy ?Other Clinician: Donavan Burnet ?Referring Provider: Diona Browner, Amy ?Treating Provider/Extender: Jeri Cos ?Weeks in Treatment: 5 ?Active Problems ?ICD-10 ?Encounter ?Code Description Active Date MDM ?Diagnosis ?N30.41 Irradiation cystitis with hematuria 06/25/2021 No Yes ?N39.3 Stress incontinence (male) (male) 06/25/2021 No Yes ?Z85.46 Personal history of malignant neoplasm of prostate 06/25/2021 No Yes ?E11.69 Type 2 diabetes mellitus with other specified complication 09/19/5364 No Yes ?Inactive Problems ?Resolved Problems ?Electronic Signature(s) ?Signed: 07/30/2021 5:57:46 PM By: Worthy Keeler PA-C ?Entered By: Worthy Keeler on 07/30/2021 17:57:46 ?BADR, PIEDRA F. ([id number]) ?-------------------------------------------------------------------------------- ?SuperBill Details ?Patient Name: RUEL, DIMMICK. ?Date of Service: 07/30/2021 ?Medical Record Number: [id number] ?Patient Account Number: [id number] ?Date of Birth/Sex: 22-Oct-1955 (66 y.o. M) ?Treating RN: Cornell Barman ?Primary Care Provider: Diona Browner, Amy ?Other Clinician: Donavan Burnet ?Referring Provider: Diona Browner, Amy ?Treating Provider/Extender: Jeri Cos ?Weeks in Treatment: 5 ?Diagnosis Coding ?ICD-10 Codes ?Code Description ?N30.41 Irradiation cystitis with hematuria ?N39.3 Stress incontinence (male) (male) ?Z85.46 Personal history of malignant neoplasm of prostate ?E11.69 Type 2 diabetes mellitus with other specified complication ?Facility Procedures ?CPT4 Code: 33295188 ?Description: (Facility Use Only) HBOT, full body chamber, 72mn ?Modifier: ?Quantity: 4 ?CPT4 Code: ?Description: ICD-10 Diagnosis Description N30.41 Irradiation  cystitis with hematuria Z85.46 Personal history of malignant neoplasm of prostate E11.69 Type 2 diabetes mellitus with other specified complication ?Modifier: ?Quantity: ?Physician Procedures ?CPT4 Code: 64166063?Description: 99183 - WC PHYS HYPERBARIC OXYGEN THERAPY ?Modifier: ?Quantity: 1 ?CPT4 Code: ?Description: ICD-10 Diagnosis Description N30.41 Irradiation cystitis with hematuria Z85.46 Personal history of malignant neoplasm of prostate E11.69 Type 2 diabetes mellitus with other specified complication ?Modifier: ?Quantity: ?Electronic Signature(s) ?Signed: 07/30/2021 5:57:59 PM By: SWorthy KeelerPA-C ?Previous Signature: 07/30/2021 10:43:58 AM Version By: SDonavan BurnetCHT, EMT, BS ?Entered By: SWorthy Keeleron 07/30/2021 17:57:59 ?

## 2021-08-02 ENCOUNTER — Encounter: Payer: 59 | Admitting: Physician Assistant

## 2021-08-02 DIAGNOSIS — N3041 Irradiation cystitis with hematuria: Secondary | ICD-10-CM | POA: Diagnosis not present

## 2021-08-02 LAB — GLUCOSE, CAPILLARY
Glucose-Capillary: 195 mg/dL — ABNORMAL HIGH (ref 70–99)
Glucose-Capillary: 123 mg/dL — ABNORMAL HIGH (ref 70–99)

## 2021-08-02 NOTE — Progress Notes (Signed)
CHIPPER, KOUDELKA ([id number]) ?Visit Report for 08/02/2021 ?Arrival Information Details ?Patient Name: Dennis Hicks, Dennis Hicks ?Date of Service: 08/02/2021 8:00 AM ?Medical Record Number: [id number] ?Patient Account Number: [id number] ?Date of Birth/Sex: Jun 10, 1955 (66 y.o. M) ?Treating RN: Cornell Barman ?Primary Care Deangelo Berns: Diona Browner, Amy ?Other Clinician: Jacqulyn Bath ?Referring Adylene Dlugosz: Diona Browner, Amy ?Treating Jurney Overacker/Extender: Jeri Cos ?Weeks in Treatment: 5 ?Visit Information History Since Last Visit ?Added or deleted any medications: No ?Patient Arrived: Ambulatory ?Any new allergies or adverse reactions: No ?Arrival Time: 07:50 ?Had a fall or experienced change in No ?Accompanied By: self ?activities of daily living that may affect ?Transfer Assistance: None ?risk of falls: ?Patient Identification Verified: Yes ?Signs or symptoms of abuse/neglect since last visito No ?Secondary Verification Process Completed: Yes ?Hospitalized since last visit: No ?Patient Requires Transmission-Based Precautions: No ?Implantable device outside of the clinic excluding No ?Patient Has Alerts: No ?cellular tissue based products placed in the center ?since last visit: ?Pain Present Now: No ?Electronic Signature(s) ?Signed: 08/02/2021 2:16:35 PM By: Enedina Finner RCP, RRT, CHT ?Entered By: Enedina Finner on 08/02/2021 08:31:29 ?Dennis Hicks, Dennis F. ([id number]) ?-------------------------------------------------------------------------------- ?Encounter Discharge Information Details ?Patient Name: Dennis Hicks, Dennis Hicks ?Date of Service: 08/02/2021 8:00 AM ?Medical Record Number: [id number] ?Patient Account Number: [id number] ?Date of Birth/Sex: 04/21/1955 (66 y.o. M) ?Treating RN: Cornell Barman ?Primary Care Kandance Yano: Diona Browner, Amy ?Other Clinician: Jacqulyn Bath ?Referring Divinity Kyler: Diona Browner, Amy ?Treating Jrake Rodriquez/Extender: Jeri Cos ?Weeks in Treatment: 5 ?Encounter Discharge Information Items ?Discharge Condition: Stable ?Ambulatory  Status: Ambulatory ?Discharge Destination: Home ?Transportation: Private Auto ?Accompanied By: self ?Schedule Follow-up Appointment: No ?Clinical Summary of Care: ?Notes ?Patient has an HBO treatment scheduled on 08/03/21 at 08:00 am. ?Electronic Signature(s) ?Signed: 08/02/2021 2:16:35 PM By: Enedina Finner RCP, RRT, CHT ?Entered By: Enedina Finner on 08/02/2021 11:14:13 ?Dennis Hicks, Dennis F. ([id number]) ?-------------------------------------------------------------------------------- ?Vitals Details ?Patient Name: Dennis Hicks, Dennis Hicks ?Date of Service: 08/02/2021 8:00 AM ?Medical Record Number: [id number] ?Patient Account Number: [id number] ?Date of Birth/Sex: Oct 02, 1955 (66 y.o. M) ?Treating RN: Cornell Barman ?Primary Care Natallia Stellmach: Diona Browner, Amy ?Other Clinician: Jacqulyn Bath ?Referring Husna Krone: Diona Browner, Amy ?Treating Allyssia Skluzacek/Extender: Jeri Cos ?Weeks in Treatment: 5 ?Vital Signs ?Time Taken: 08:04 ?Temperature (??F): 98.6 ?Height (in): 68 ?Pulse (bpm): 84 ?Weight (lbs): 185 ?Respiratory Rate (breaths/min): 16 ?Body Mass Index (BMI): 28.1 ?Blood Pressure (mmHg): 104/64 ?Capillary Blood Glucose (mg/dl): 195 ?Reference Range: 80 - 120 mg / dl ?Electronic Signature(s) ?Signed: 08/02/2021 2:16:35 PM By: Enedina Finner RCP, RRT, CHT ?Entered By: Enedina Finner on 08/02/2021 08:32:16 ?

## 2021-08-03 ENCOUNTER — Encounter: Payer: 59 | Admitting: Physician Assistant

## 2021-08-03 DIAGNOSIS — N3041 Irradiation cystitis with hematuria: Secondary | ICD-10-CM | POA: Diagnosis not present

## 2021-08-03 LAB — GLUCOSE, CAPILLARY
Glucose-Capillary: 177 mg/dL — ABNORMAL HIGH (ref 70–99)
Glucose-Capillary: 121 mg/dL — ABNORMAL HIGH (ref 70–99)

## 2021-08-03 NOTE — Progress Notes (Signed)
Dennis Hicks, Dennis Hicks ([id number]) ?Visit Report for 08/03/2021 ?Arrival Information Details ?Patient Name: Dennis Hicks, Dennis Hicks ?Date of Service: 08/03/2021 8:00 AM ?Medical Record Number: [id number] ?Patient Account Number: [id number] ?Date of Birth/Sex: 1955/05/04 (66 y.o. M) ?Treating RN: Cornell Barman ?Primary Care Janene Yousuf: Diona Browner, Amy ?Other Clinician: Jacqulyn Bath ?Referring Sadik Piascik: Diona Browner, Amy ?Treating Rosamae Rocque/Extender: Jeri Cos ?Weeks in Treatment: 5 ?Visit Information History Since Last Visit ?Added or deleted any medications: No ?Patient Arrived: Ambulatory ?Any new allergies or adverse reactions: No ?Arrival Time: 07:55 ?Had a fall or experienced change in No ?Accompanied By: self ?activities of daily living that may affect ?Transfer Assistance: None ?risk of falls: ?Patient Identification Verified: Yes ?Signs or symptoms of abuse/neglect since last visito No ?Secondary Verification Process Completed: Yes ?Hospitalized since last visit: No ?Patient Requires Transmission-Based Precautions: No ?Implantable device outside of the clinic excluding No ?Patient Has Alerts: No ?cellular tissue based products placed in the center ?since last visit: ?Pain Present Now: No ?Electronic Signature(s) ?Signed: 08/03/2021 12:19:12 PM By: Enedina Finner RCP, RRT, CHT ?Entered By: Enedina Finner on 08/03/2021 08:52:10 ?[name] ([id number]) ?-------------------------------------------------------------------------------- ?Encounter Discharge Information Details ?Patient Name: Dennis Hicks, Dennis Hicks ?Date of Service: 08/03/2021 8:00 AM ?Medical Record Number: [id number] ?Patient Account Number: [id number] ?Date of Birth/Sex: Apr 08, 1955 (66 y.o. M) ?Treating RN: Cornell Barman ?Primary Care Enma Maeda: Diona Browner, Amy ?Other Clinician: Jacqulyn Bath ?Referring Anastashia Westerfeld: Diona Browner, Amy ?Treating Avilyn Virtue/Extender: Jeri Cos ?Weeks in Treatment: 5 ?Encounter Discharge Information Items ?Discharge Condition: Stable ?Ambulatory  Status: Ambulatory ?Discharge Destination: Home ?Transportation: Private Auto ?Accompanied By: self ?Schedule Follow-up Appointment: Yes ?Clinical Summary of Care: ?Notes ?Patient has an HBO treatment scheduled on 08/04/21 at 08:00 am. ?Electronic Signature(s) ?Signed: 08/03/2021 12:19:12 PM By: Enedina Finner RCP, RRT, CHT ?Entered By: Enedina Finner on 08/03/2021 12:18:27 ?Dennis Hicks, Dennis F. ([id number]) ?-------------------------------------------------------------------------------- ?Vitals Details ?Patient Name: Dennis Hicks, Dennis Hicks ?Date of Service: 08/03/2021 8:00 AM ?Medical Record Number: [id number] ?Patient Account Number: [id number] ?Date of Birth/Sex: 02/25/1956 (66 y.o. M) ?Treating RN: Cornell Barman ?Primary Care Auset Fritzler: Diona Browner, Amy ?Other Clinician: Jacqulyn Bath ?Referring Marg Macmaster: Diona Browner, Amy ?Treating Makeda Peeks/Extender: Jeri Cos ?Weeks in Treatment: 5 ?Vital Signs ?Time Taken: 08:09 ?Temperature (??F): 98.1 ?Height (in): 68 ?Pulse (bpm): 78 ?Weight (lbs): 185 ?Respiratory Rate (breaths/min): 16 ?Body Mass Index (BMI): 28.1 ?Blood Pressure (mmHg): 106/66 ?Capillary Blood Glucose (mg/dl): 177 ?Reference Range: 80 - 120 mg / dl ?Electronic Signature(s) ?Signed: 08/03/2021 12:19:12 PM By: Enedina Finner RCP, RRT, CHT ?Entered By: Enedina Finner on 08/03/2021 08:53:11 ?

## 2021-08-03 NOTE — Progress Notes (Signed)
JOSEDE, CICERO ([id number]) ?Visit Report for 08/03/2021 ?HBO Details ?Patient Name: Dennis Hicks, Dennis Hicks ?Date of Service: 08/03/2021 8:00 AM ?Medical Record Number: [id number] ?Patient Account Number: [id number] ?Date of Birth/Sex: February 19, 1956 (66 y.o. 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 ?HBO Treatment Details ?Treatment Number: 22 ?Patient Type: Outpatient ?Chamber Type: Monoplace ?Chamber Serial #: [id number] ?Treatment Protocol: 2.5 ATA with 90 minutes oxygen, with two 5 minute air breaks ?Treatment Details ?Compression Rate Down: 1.5 psi / minute ?De-Compression Rate Up: 2.0 psi / minute ?Compress Tx Pressure Air breaks and breathing periods Decompress Decompress ?Begins Reached (leave unused spaces blank) Begins Ends ?Chamber Pressure (ATA) 1 2.5 2.5 2.5 2.5 2.5 - - 2.5 1 ?Clock Time (24 hr) 08:16 08:30 09:00 09:06 09:36 09:41 - - 10:11 10:23 ?Treatment Length: 127 (minutes) ?Treatment Segments: 4 ?Vital Signs ?Capillary Blood Glucose Reference Range: 80 - 120 mg / dl ?HBO Diabetic Blood Glucose Intervention Range: <131 mg/dl or >249 mg/dl ?Time Vitals Blood Respiratory Capillary Blood Glucose Pulse Action ?Type: ?Pulse: Temperature: ?Taken: ?Pressure: ?Rate: ?Glucose (mg/dl): ?Meter #: Oximetry (%) Taken: ?Pre 08:09 106/66 78 16 98.1 177 1 none per protocol ?Post 10:33 114/78 60 16 97.7 121 1 none per protocol ?Treatment Response ?Treatment Toleration: Well ?Treatment Completion ?Treatment Completed without Adverse Event ?Status: ?Electronic Signature(s) ?Signed: 08/03/2021 12:19:12 PM By: Enedina Finner RCP, RRT, CHT ?Signed: 08/03/2021 4:58:37 PM  By: Worthy Keeler PA-C ?Entered By: Enedina Finner on 08/03/2021 12:17:38 ?AMADEO, COKE ([id number]) ?-------------------------------------------------------------------------------- ?HBO Safety Checklist Details ?Patient Name: Dennis Hicks, Dennis Hicks ?Date of Service: 08/03/2021 8:00 AM ?Medical Record Number: [id number] ?Patient Account Number: [id number] ?Date of Birth/Sex: 1955/12/26 (66 y.o. M) ?Treating RN: Cornell Barman ?Primary Care Kwasi Joung: Diona Browner, Amy ?Other Clinician: Jacqulyn Bath ?Referring Gopal Malter: Diona Browner, Amy ?Treating Jhonny Calixto/Extender: Jeri Cos ?Weeks in Treatment: 5 ?HBO Safety Checklist Items ?Safety Checklist ?Consent Form Signed ?Patient voided / foley secured and emptied ?When did you last eato am ?Last dose of injectable or oral agent am ?Ostomy pouch emptied and vented if applicable ?NA ?All implantable devices assessed, documented and approved ?NA ?Intravenous access site secured and place ?NA ?Valuables secured ?Linens and cotton and cotton/polyester blend (less than 51% polyester) ?Personal oil-based products / skin lotions / body lotions removed ?Wigs or hairpieces removed ?NA ?Smoking or tobacco materials removed ?NA ?Books / newspapers / magazines / loose paper removed ?NA ?Cologne, aftershave, perfume and deodorant removed ?Jewelry removed (may wrap wedding band) ?Make-up removed ?NA ?Hair care products removed ?Battery operated devices (external) removed ?NA ?Heating patches and chemical warmers removed ?NA ?Titanium eyewear removed ?NA ?Nail polish cured greater than 10 hours ?NA ?Casting material cured greater than 10 hours ?NA ?Hearing aids removed ?NA ?Loose dentures or partials removed ?NA ?Prosthetics have been removed ?NA ?Patient demonstrates correct use of air break device (if applicable) ?Patient concerns have been addressed ?Patient grounding bracelet on and cord attached to chamber ?Specifics for Inpatients (complete in addition to ?above) ?Medication sheet sent  with patient ?Intravenous medications needed or due during therapy sent with patient ?Drainage tubes (e.g. nasogastric tube or chest tube secured and vented) ?Endotracheal or Tracheotomy tube secured ?Cuff deflated of air and inflated with saline ?Airway suctioned ?Electronic Signature(s) ?Signed: 08/03/2021  12:19:12 PM By: Enedina Finner RCP, RRT, CHT ?Entered By: Enedina Finner on 08/03/2021 08:54:03 ?

## 2021-08-03 NOTE — Progress Notes (Signed)
CALUB, TARNOW ([id number]) ?Visit Report for 08/02/2021 ?HBO Details ?Patient Name: Dennis Hicks, Dennis Hicks ?Date of Service: 08/02/2021 8:00 AM ?Medical Record Number: [id number] ?Patient Account Number: [id number] ?Date of Birth/Sex: [birth year] (66 y.o. 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 ?HBO Treatment Details ?Treatment Number: 21 ?Patient Type: Outpatient ?Chamber Type: Monoplace ?Chamber Serial #: [id number] ?Treatment Protocol: 2.5 ATA with 90 minutes oxygen, with two 5 minute air breaks ?Treatment Details ?Compression Rate Down: 1.5 psi / minute ?De-Compression Rate Up: 2.0 psi / minute ?Compress Tx Pressure Air breaks and breathing periods Decompress Decompress ?Begins Reached (leave unused spaces blank) Begins Ends ?Chamber Pressure (ATA) 1 2.5 2.5 2.5 2.5 2.5 - - 2.5 1 ?Clock Time (24 hr) 08:15 08:30 09:00 09:05 09:37 09:42 - - 10:12 10:23 ?Treatment Length: 128 (minutes) ?Treatment Segments: 4 ?Vital Signs ?Capillary Blood Glucose Reference Range: 80 - 120 mg / dl ?HBO Diabetic Blood Glucose Intervention Range: <131 mg/dl or >249 mg/dl ?Time Vitals Blood Respiratory Capillary Blood Glucose Pulse Action ?Type: ?Pulse: Temperature: ?Taken: ?Pressure: ?Rate: ?Glucose (mg/dl): ?Meter #: Oximetry (%) Taken: ?Pre 08:04 104/64 84 16 98.6 195 1 195 ?Post 10:34 106/70 60 16 97.8 123 1 none per protocol ?Treatment Response ?Treatment Toleration: Well ?Treatment Completion ?Treatment Completed without Adverse Event ?Status: ?Electronic Signature(s) ?Signed: 08/02/2021 2:16:35 PM By: Enedina Finner RCP, RRT, CHT ?Signed: 08/02/2021 5:23:27 PM By: Worthy Keeler PA-C ?Entered By: Enedina Finner on 08/02/2021 11:12:53 ?MAHIN, GUARDIA ([id number]) ?-------------------------------------------------------------------------------- ?HBO Safety Checklist Details ?Patient Name: Dennis Hicks ?Date of Service: 08/02/2021 8:00 AM ?Medical Record Number: [id number] ?Patient Account Number: [id number] ?Date of Birth/Sex: 04-22-1955 (66 y.o. M) ?Treating RN: Cornell Barman ?Primary Care Pansey Pinheiro: Diona Browner, Amy ?Other Clinician: Jacqulyn Bath ?Referring Orlie Cundari: Diona Browner, Amy ?Treating Delmar Arriaga/Extender: Jeri Cos ?Weeks in Treatment: 5 ?HBO Safety Checklist Items ?Safety Checklist ?Consent Form Signed ?Patient voided / foley secured and emptied ?When did you last eato am ?Last dose of injectable or oral agent am ?Ostomy pouch emptied and vented if applicable ?NA ?All implantable devices assessed, documented and approved ?NA ?Intravenous access site secured and place ?NA ?Valuables secured ?Linens and cotton and cotton/polyester blend (less than 51% polyester) ?Personal oil-based products / skin lotions / body lotions removed ?Wigs or hairpieces removed ?NA ?Smoking or tobacco materials removed ?NA ?Books / newspapers / magazines / loose paper removed ?NA ?Cologne, aftershave, perfume and deodorant removed ?Jewelry removed (may wrap wedding band) ?Make-up removed ?NA ?Hair care products removed ?Battery operated devices (external) removed ?NA ?Heating patches and chemical warmers removed ?NA ?Titanium eyewear removed ?NA ?Nail polish cured greater than 10 hours ?NA ?Casting material cured greater than 10 hours ?NA ?Hearing aids removed ?NA ?Loose dentures or partials removed ?NA ?Prosthetics have been removed ?NA ?Patient demonstrates correct use of air break device (if applicable) ?Patient concerns have been addressed ?Patient grounding bracelet on and cord attached to chamber ?Specifics for Inpatients (complete in addition to ?above) ?Medication sheet sent with  patient ?Intravenous medications needed or due during therapy sent with patient ?Drainage tubes (e.g. nasogastric tube or chest tube secured and vented) ?Endotracheal or Tracheotomy tube secured ?Cuff deflated of air and inflated with saline ?Airway suctioned ?Electronic Signature(s) ?Signed: 08/02/2021 2:16:35 PM  By: Enedina Finner RCP, RRT, CHT ?Entered By: Enedina Finner on 08/02/2021 08:33:49 ?

## 2021-08-04 ENCOUNTER — Encounter (HOSPITAL_BASED_OUTPATIENT_CLINIC_OR_DEPARTMENT_OTHER): Payer: 59 | Admitting: Internal Medicine

## 2021-08-04 DIAGNOSIS — E1169 Type 2 diabetes mellitus with other specified complication: Secondary | ICD-10-CM

## 2021-08-04 DIAGNOSIS — Z8546 Personal history of malignant neoplasm of prostate: Secondary | ICD-10-CM

## 2021-08-04 DIAGNOSIS — N3041 Irradiation cystitis with hematuria: Secondary | ICD-10-CM

## 2021-08-04 LAB — GLUCOSE, CAPILLARY
Glucose-Capillary: 176 mg/dL — ABNORMAL HIGH (ref 70–99)
Glucose-Capillary: 100 mg/dL — ABNORMAL HIGH (ref 70–99)

## 2021-08-04 NOTE — Progress Notes (Signed)
Dennis Hicks, Dennis Hicks ([id number]) ?Visit Report for 08/04/2021 ?HBO Details ?Patient Name: Dennis Hicks, Dennis Hicks ?Date of Service: 08/04/2021 8:00 AM ?Medical Record Number: [id number] ?Patient Account Number: [id number] ?Date of Birth/Sex: [birth year] (66 y.o. 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 ?HBO Treatment Details ?Treatment Number: 92 ?Patient Type: Outpatient ?Chamber Type: Monoplace ?Chamber Serial #: [id number] ?Treatment Protocol: 2.5 ATA with 90 minutes oxygen, with two 5 minute air breaks ?Treatment Details ?Compression Rate Down: 1.5 psi / minute ?De-Compression Rate Up: 2.0 psi / minute ?Compress Tx Pressure Air breaks and breathing periods Decompress Decompress ?Begins Reached (leave unused spaces blank) Begins Ends ?Chamber Pressure (ATA) 1 2.5 2.5 2.5 2.5 2.5 - - 2.5 1 ?Clock Time (24 hr) 08:33 08:48 09:19 09:24 09:54 09:59 - - 10:30 10:41 ?Treatment Length: 128 (minutes) ?Treatment Segments: 4 ?Vital Signs ?Capillary Blood Glucose Reference Range: 80 - 120 mg / dl ?HBO Diabetic Blood Glucose Intervention Range: <131 mg/dl or >249 mg/dl ?Time Vitals Blood Respiratory Capillary Blood Glucose Pulse Action ?Type: ?Pulse: Temperature: ?Taken: ?Pressure: ?Rate: ?Glucose (mg/dl): ?Meter #: Oximetry (%) Taken: ?Pre 08:02 114/74 72 16 98.6 176 1 none per protocol ?Post 10:51 100 1 none per protocol ?Treatment Response ?Treatment Toleration: Well ?Treatment Completion ?Treatment Completed without Adverse Event ?Status: ?HBO Attestation ?I certify that I supervised this HBO treatment in accordance with ?Medicare guidelines. A trained emergency response team is  readily Yes ?available per hospital policies and procedures. ?Continue HBOT as ordered. Yes ?Electronic Signature(s) ?Signed: 08/04/2021 3:38:39 PM By: Kalman Shan DO ?Previous Signature: 08/04/2021 2:49:02 PM Version By: Enedina Finner RCP, RRT, CHT ?Entered By: Kalman Shan on 08/04/2021 15:29:44 ?Dennis Hicks, Dennis Hicks ([id number]) ?-------------------------------------------------------------------------------- ?HBO Safety Checklist Details ?Patient Name: Dennis Hicks, Dennis Hicks ?Date of Service: 08/04/2021 8:00 AM ?Medical Record Number: [id number] ?Patient Account Number: [id number] ?Date of Birth/Sex: Mar 25, 1955 (66 y.o. M) ?Treating RN: Cornell Barman ?Primary Care Kiasha Bellin: Diona Browner, Amy ?Other Clinician: Jacqulyn Bath ?Referring Marin Milley: Diona Browner, Amy ?Treating Alicen Donalson/Extender: Kalman Shan ?Weeks in Treatment: 5 ?HBO Safety Checklist Items ?Safety Checklist ?Consent Form Signed ?Patient voided / foley secured and emptied ?When did you last eato am ?Last dose of injectable or oral agent am ?Ostomy pouch emptied and vented if applicable ?NA ?All implantable devices assessed, documented and approved ?NA ?Intravenous access site secured and place ?NA ?Valuables secured ?Linens and cotton and cotton/polyester blend (less than 51% polyester) ?Personal oil-based products / skin lotions / body lotions removed ?Wigs or hairpieces removed ?NA ?Smoking or tobacco materials removed ?NA ?Books / newspapers / magazines / loose paper removed ?NA ?Cologne, aftershave, perfume and deodorant removed ?Jewelry removed (may wrap wedding band) ?Make-up removed ?NA ?Hair care products removed ?Battery operated devices (external) removed ?NA ?Heating patches and chemical warmers removed ?NA ?Titanium eyewear removed ?NA ?Nail polish cured greater than 10 hours ?NA ?Casting material cured greater than 10 hours ?NA ?Hearing aids removed ?NA ?Loose dentures or partials removed ?NA ?Prosthetics have been removed ?NA ?Patient  demonstrates correct use of air break device (if applicable) ?Patient concerns have been addressed ?Patient grounding bracelet on and cord attached to chamber ?Specifics for Inpatients (complete in addition to ?above) ?Medication sheet sent with patient ?Intravenous medications needed or due during therapy sent with patient ?  Drainage tubes (e.g. nasogastric tube or chest tube secured and vented) ?Endotracheal or Tracheotomy tube secured ?Cuff deflated of air and inflated with saline ?Airway suctioned ?Electronic Signature(s) ?Signed: 08/04/2021 2:49:02 PM By: Enedina Finner RCP, RRT, CHT ?Entered By: Enedina Finner on 08/04/2021 09:05:56 ?

## 2021-08-04 NOTE — Progress Notes (Signed)
DELAINE, CANTER ([id number]) ?Visit Report for 08/04/2021 ?Arrival Information Details ?Patient Name: Dennis Hicks, Dennis Hicks ?Date of Service: 08/04/2021 8:00 AM ?Medical Record Number: [id number] ?Patient Account Number: [id number] ?Date of Birth/Sex: 1956-01-10 (66 y.o. M) ?Treating RN: Cornell Barman ?Primary Care Ranard Harte: Diona Browner, Amy ?Other Clinician: Jacqulyn Bath ?Referring Vedansh Kerstetter: Diona Browner, Amy ?Treating Claiborne Stroble/Extender: Kalman Shan ?Weeks in Treatment: 5 ?Visit Information History Since Last Visit ?Added or deleted any medications: No ?Patient Arrived: Ambulatory ?Any new allergies or adverse reactions: No ?Arrival Time: 07:55 ?Had a fall or experienced change in No ?Accompanied By: self ?activities of daily living that may affect ?Transfer Assistance: None ?risk of falls: ?Patient Identification Verified: Yes ?Signs or symptoms of abuse/neglect since last visito No ?Secondary Verification Process Completed: Yes ?Hospitalized since last visit: No ?Patient Requires Transmission-Based Precautions: No ?Implantable device outside of the clinic excluding No ?Patient Has Alerts: No ?cellular tissue based products placed in the center ?since last visit: ?Pain Present Now: No ?Electronic Signature(s) ?Signed: 08/04/2021 2:49:02 PM By: Enedina Finner RCP, RRT, CHT ?Entered By: Enedina Finner on 08/04/2021 08:53:22 ?RIESE, HELLARD F. ([id number]) ?-------------------------------------------------------------------------------- ?Encounter Discharge Information Details ?Patient Name: Dennis Hicks ?Date of Service: 08/04/2021 8:00 AM ?Medical Record Number: [id number] ?Patient Account Number: [id number] ?Date of Birth/Sex: 11/18/1955 (66 y.o. M) ?Treating RN: Cornell Barman ?Primary Care Ramisa Duman: Diona Browner, Amy ?Other Clinician: Jacqulyn Bath ?Referring Lenora Gomes: Diona Browner, Amy ?Treating Klinton Candelas/Extender: Kalman Shan ?Weeks in Treatment: 5 ?Encounter Discharge Information Items ?Discharge Condition:  Stable ?Ambulatory Status: Ambulatory ?Discharge Destination: Home ?Transportation: Private Auto ?Accompanied By: self ?Schedule Follow-up Appointment: Yes ?Clinical Summary of Care: ?Notes ?Patient has an HBO treatment scheduled on 08/05/21 at 08:00 am. ?Electronic Signature(s) ?Signed: 08/04/2021 2:49:02 PM By: Enedina Finner RCP, RRT, CHT ?Entered By: Enedina Finner on 08/04/2021 11:04:12 ?YESENIA, FONTENETTE F. ([id number]) ?-------------------------------------------------------------------------------- ?Vitals Details ?Patient Name: Dennis Hicks ?Date of Service: 08/04/2021 8:00 AM ?Medical Record Number: [id number] ?Patient Account Number: [id number] ?Date of Birth/Sex: 1956/01/08 (66 y.o. M) ?Treating RN: Cornell Barman ?Primary Care Vendetta Pittinger: Diona Browner, Amy ?Other Clinician: Jacqulyn Bath ?Referring Emonte Dieujuste: Diona Browner, Amy ?Treating Garrin Kirwan/Extender: Kalman Shan ?Weeks in Treatment: 5 ?Vital Signs ?Time Taken: 08:02 ?Temperature (??F): 98.6 ?Height (in): 68 ?Pulse (bpm): 72 ?Weight (lbs): 185 ?Respiratory Rate (breaths/min): 16 ?Body Mass Index (BMI): 28.1 ?Blood Pressure (mmHg): 114/74 ?Capillary Blood Glucose (mg/dl): 176 ?Reference Range: 80 - 120 mg / dl ?Electronic Signature(s) ?Signed: 08/04/2021 2:49:02 PM By: Enedina Finner RCP, RRT, CHT ?Entered By: Enedina Finner on 08/04/2021 08:54:29 ?

## 2021-08-05 ENCOUNTER — Encounter: Payer: 59 | Admitting: Physician Assistant

## 2021-08-05 DIAGNOSIS — N3041 Irradiation cystitis with hematuria: Secondary | ICD-10-CM | POA: Diagnosis not present

## 2021-08-05 LAB — GLUCOSE, CAPILLARY
Glucose-Capillary: 152 mg/dL — ABNORMAL HIGH (ref 70–99)
Glucose-Capillary: 101 mg/dL — ABNORMAL HIGH (ref 70–99)

## 2021-08-06 ENCOUNTER — Encounter: Payer: 59 | Admitting: Physician Assistant

## 2021-08-06 DIAGNOSIS — N3041 Irradiation cystitis with hematuria: Secondary | ICD-10-CM | POA: Diagnosis not present

## 2021-08-06 LAB — GLUCOSE, CAPILLARY
Glucose-Capillary: 175 mg/dL — ABNORMAL HIGH (ref 70–99)
Glucose-Capillary: 124 mg/dL — ABNORMAL HIGH (ref 70–99)

## 2021-08-06 NOTE — Progress Notes (Signed)
ARISTIDE, WAGGLE ([id number]) Visit Report for 08/05/2021 Arrival Information Details Patient Name: Dennis Hicks, Dennis Hicks Date of Service: 08/05/2021 8:00 AM Medical Record Number: [id number] Patient Account Number: [id number] Date of Birth/Sex: 05/02/1955 (65 y.o. M) Treating RN: Cornell Barman Primary Care Cindia Hustead: Eliezer Lofts Other Clinician: Jacqulyn Bath Referring Jameis Newsham: Eliezer Lofts Treating Akiyah Eppolito/Extender: Dennis Hicks in Treatment: 5 Visit Information History Since Last Visit Added or deleted any medications: No Patient Arrived: Ambulatory Any new allergies or adverse reactions: No Arrival Time: 07:55 Had a fall or experienced change in No Accompanied By: self activities of daily living that may affect Transfer Assistance: None risk of falls: Patient Identification Verified: Yes Signs or symptoms of abuse/neglect since last visito No Secondary Verification Process Completed: Yes Hospitalized since last visit: No Patient Requires Transmission-Based Precautions: No Implantable device outside of the clinic excluding No Patient Has Alerts: No cellular tissue based products placed in the center since last visit: Pain Present Now: No Electronic Signature(s) Signed: 08/05/2021 3:48:59 PM By: Enedina Finner RCP, RRT, CHT Entered By: Enedina Finner on 08/05/2021 08:33:45 Dennis Hicks ([id number]) -------------------------------------------------------------------------------- Encounter Discharge Information Details Patient Name: Dennis Hicks Date of Service: 08/05/2021 8:00 AM Medical Record Number: [id number] Patient Account Number: [id number] Date of Birth/Sex: [birth year] (65 y.o. M) Treating RN: Cornell Barman Primary Care Early Steel: Eliezer Lofts Other Clinician: Jacqulyn Bath Referring Kyren Knick: Eliezer Lofts Treating Anush Wiedeman/Extender: Dennis Hicks in Treatment: 5 Encounter Discharge Information Items Discharge Condition:  Stable Ambulatory Status: Ambulatory Discharge Destination: Home Transportation: Private Auto Accompanied By: self Schedule Follow-up Appointment: Yes Clinical Summary of Care: Notes Patient has an HBO treatment scheduled on 08/06/21 at 08:00 am. Electronic Signature(s) Signed: 08/05/2021 3:48:59 PM By: Enedina Finner RCP, RRT, CHT Entered By: Enedina Finner on 08/05/2021 11:31:01 Dennis Hicks ([id number]) -------------------------------------------------------------------------------- Vitals Details Patient Name: Dennis Hicks Date of Service: 08/05/2021 8:00 AM Medical Record Number: [id number] Patient Account Number: [id number] Date of Birth/Sex: 07/09/1955 (65 y.o. M) Treating RN: Cornell Barman Primary Care Shade Kaley: Eliezer Lofts Other Clinician: Jacqulyn Bath Referring Trista Ciocca: Eliezer Lofts Treating Jesslynn Kruck/Extender: Dennis Hicks in Treatment: 5 Vital Signs Time Taken: 08:04 Temperature (F): 98.1 Height (in): 68 Pulse (bpm): 72 Weight (lbs): 185 Respiratory Rate (breaths/min): 16 Body Mass Index (BMI): 28.1 Blood Pressure (mmHg): 112/64 Capillary Blood Glucose (mg/dl): 152 Reference Range: 80 - 120 mg / dl Electronic Signature(s) Signed: 08/05/2021 3:48:59 PM By: Enedina Finner RCP, RRT, CHT Entered By: Enedina Finner on 08/05/2021 08:35:17

## 2021-08-07 NOTE — Progress Notes (Signed)
JYDEN, KROMER ([id number]) Visit Report for 08/06/2021 Arrival Information Details Patient Name: Dennis Hicks, Dennis Hicks Date of Service: 08/06/2021 8:00 AM Medical Record Number: [id number] Patient Account Number: [id number] Date of Birth/Sex: 1955-11-07 (65 y.o. M) Treating RN: Cornell Barman Primary Care Emanuela Runnion: Eliezer Lofts Other Clinician: Jacqulyn Bath Referring Dannya Pitkin: Eliezer Lofts Treating Georgeanna Radziewicz/Extender: Skipper Cliche in Treatment: 6 Visit Information History Since Last Visit Added or deleted any medications: No Patient Arrived: Ambulatory Any new allergies or adverse reactions: No Arrival Time: 07:55 Had a fall or experienced change in No Accompanied By: self activities of daily living that may affect Transfer Assistance: None risk of falls: Patient Identification Verified: Yes Signs or symptoms of abuse/neglect since last visito No Secondary Verification Process Completed: Yes Hospitalized since last visit: No Patient Requires Transmission-Based Precautions: No Implantable device outside of the clinic excluding No Patient Has Alerts: No cellular tissue based products placed in the center since last visit: Pain Present Now: No Electronic Signature(s) Signed: 08/06/2021 1:02:26 PM By: Enedina Finner RCP, RRT, CHT Entered By: Enedina Finner on 08/06/2021 07:59:43 Dennis Hicks ([id number]) -------------------------------------------------------------------------------- Encounter Discharge Information Details Patient Name: Dennis Hicks Date of Service: 08/06/2021 8:00 AM Medical Record Number: [id number] Patient Account Number: [id number] Date of Birth/Sex: 09/14/1955 (65 y.o. M) Treating RN: Cornell Barman Primary Care Renada Cronin: Eliezer Lofts Other Clinician: Jacqulyn Bath Referring Diana Davenport: Eliezer Lofts Treating Mirabelle Cyphers/Extender: Skipper Cliche in Treatment: 6 Encounter Discharge Information Items Discharge Condition: Stable Ambulatory  Status: Ambulatory Discharge Destination: Home Transportation: Private Auto Accompanied By: self Schedule Follow-up Appointment: Yes Clinical Summary of Care: Notes Patient has an HBO treatment scheduled on 08/09/21 at 08:00 am. Electronic Signature(s) Signed: 08/06/2021 1:02:26 PM By: Enedina Finner RCP, RRT, CHT Entered By: Enedina Finner on 08/06/2021 10:39:56 Dennis Hicks ([id number]) -------------------------------------------------------------------------------- Vitals Details Patient Name: Dennis Hicks Date of Service: 08/06/2021 8:00 AM Medical Record Number: [id number] Patient Account Number: [id number] Date of Birth/Sex: 10-Dec-1955 (65 y.o. M) Treating RN: Cornell Barman Primary Care Sibbie Flammia: Eliezer Lofts Other Clinician: Jacqulyn Bath Referring Camree Wigington: Eliezer Lofts Treating Jessyca Sloan/Extender: Skipper Cliche in Treatment: 6 Vital Signs Time Taken: 08:01 Temperature (F): 98.4 Height (in): 68 Pulse (bpm): 84 Weight (lbs): 185 Respiratory Rate (breaths/min): 16 Body Mass Index (BMI): 28.1 Blood Pressure (mmHg): 122/66 Capillary Blood Glucose (mg/dl): 175 Reference Range: 80 - 120 mg / dl Electronic Signature(s) Signed: 08/06/2021 1:02:26 PM By: Enedina Finner RCP, RRT, CHT Entered By: Stark Jock, Amado Nash on 08/06/2021 84:16:60

## 2021-08-07 NOTE — Progress Notes (Signed)
HARJOT, DIBELLO ([id number]) Visit Report for 08/06/2021 HBO Details Patient Name: Dennis Hicks, Dennis Hicks Date of Service: 08/06/2021 8:00 AM Medical Record Number: [id number] Patient Account Number: [id number] Date of Birth/Sex: 10/30/1955 (65 y.o. 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 HBO Treatment Details Treatment Number: 25 Patient Type: Outpatient Chamber Type: Monoplace Chamber Serial #: [id number] Treatment Protocol: 2.5 ATA with 90 minutes oxygen, with two 5 minute air breaks Treatment Details Compression Rate Down: 2.0 psi / minute De-Compression Rate Up: 2.0 psi / minute Compress Tx Pressure Air breaks and breathing periods Decompress Decompress Begins Reached (leave unused spaces blank) Begins Ends Chamber Pressure (ATA) 1 2.5 2.5 2.5 2.5 2.5 - - 2.5 1 Clock Time (24 hr) 08:04 08:16 08:46 08:51 09:21 09:26 - - 09:56 10:08 Treatment Length: 124 (minutes) Treatment Segments: 4 Vital Signs Capillary Blood Glucose Reference Range: 80 - 120 mg / dl HBO Diabetic Blood Glucose Intervention Range: <131 mg/dl or >249 mg/dl Time Vitals Blood Respiratory Capillary Blood Glucose Pulse Action Type: Pulse: Temperature: Taken: Pressure: Rate: Glucose (mg/dl): Meter #: Oximetry (%) Taken: Pre 08:01 122/66 84 16 98.4 175 1 none per protocol Post 10:16 118/72 66 16 97.7 124 1 none per protocol Treatment Response Treatment Toleration: Well Treatment Completion Treatment Completed without Adverse Event Status: HBO Attestation I certify that I supervised this HBO treatment in accordance with Medicare guidelines. A trained emergency  response team is readily Yes available per hospital policies and procedures. Continue HBOT as ordered. Yes Electronic Signature(s) Signed: 08/06/2021 1:46:17 PM By: Worthy Keeler PA-C Previous Signature: 08/06/2021 1:02:26 PM Version By: Enedina Finner RCP, RRT, CHT Entered By: Worthy Keeler on 08/06/2021 13:46:16 Dennis Hicks ([id number]) -------------------------------------------------------------------------------- HBO Safety Checklist Details Patient Name: Dennis Hicks Date of Service: 08/06/2021 8:00 AM Medical Record Number: [id number] Patient Account Number: [id number] Date of Birth/Sex: Apr 22, 1955 (65 y.o. M) Treating RN: Cornell Barman Primary Care Roslynn Holte: Eliezer Lofts Other Clinician: Jacqulyn Bath Referring Manson Luckadoo: Eliezer Lofts Treating Emillia Weatherly/Extender: Skipper Cliche in Treatment: 6 HBO Safety Checklist Items Safety Checklist Consent Form Signed Patient voided / foley secured and emptied When did you last eato am Last dose of injectable or oral agent am Ostomy pouch emptied and vented if applicable NA All implantable devices assessed, documented and approved NA Intravenous access site secured and place NA Valuables secured Linens and cotton and cotton/polyester blend (less than 51% polyester) Personal oil-based products / skin lotions / body lotions removed Wigs or hairpieces removed NA Smoking or tobacco materials removed NA Books / newspapers / magazines / loose paper removed NA Cologne, aftershave, perfume and deodorant removed Jewelry removed (may wrap wedding band) Make-up removed NA Hair care products removed Battery operated devices (external) removed NA Heating patches and chemical warmers removed NA Titanium eyewear removed NA Nail polish cured greater than 10 hours NA Casting material cured greater than 10 hours NA Hearing aids removed NA Loose dentures or partials removed NA Prosthetics have been  removed NA Patient demonstrates correct use of air break device (if applicable) Patient concerns have been addressed Patient grounding bracelet on and cord attached to chamber Specifics for Inpatients (complete in addition to above) Medication sheet sent with patient Intravenous medications needed or  due during therapy sent with patient Drainage tubes (e.g. nasogastric tube or chest tube secured and vented) Endotracheal or Tracheotomy tube secured Cuff deflated of air and inflated with saline Airway suctioned Electronic Signature(s) Signed: 08/06/2021 1:02:26 PM By: Enedina Finner RCP, RRT, CHT Entered By: Enedina Finner on 08/06/2021 08:07:15

## 2021-08-07 NOTE — Progress Notes (Signed)
Dennis Hicks, Dennis Hicks ([id number]) Visit Report for 08/05/2021 Problem List Details Patient Name: Dennis Hicks, Dennis Hicks Date of Service: 08/05/2021 8:00 AM Medical Record Number: [id number] Patient Account Number: [id number] Date of Birth/Sex: 1956/01/03 (65 y.o. M) Treating RN: Cornell Barman Primary Care Provider: Eliezer Lofts Other Clinician: Jacqulyn Bath Referring Provider: Eliezer Lofts Treating Provider/Extender: Skipper Cliche in Treatment: 5 Active Problems ICD-10 Encounter Code Description Active Date MDM Diagnosis N30.41 Irradiation cystitis with hematuria 06/25/2021 No Yes N39.3 Stress incontinence (male) (male) 06/25/2021 No Yes Z85.46 Personal history of malignant neoplasm of prostate 06/25/2021 No Yes E11.69 Type 2 diabetes mellitus with other specified complication 0/10/1446 No Yes Inactive Problems Resolved Problems Electronic Signature(s) Signed: 08/05/2021 5:48:13 PM By: Worthy Keeler PA-C Previous Signature: 08/05/2021 5:47:53 PM Version By: Worthy Keeler PA-C Entered By: Worthy Keeler on 08/05/2021 17:48:13 Dennis Hicks ([id number]) -------------------------------------------------------------------------------- SuperBill Details Patient Name: Dennis Hicks Date of Service: 08/05/2021 Medical Record Number: [id number] Patient Account Number: [id number] Date of Birth/Sex: [birth year] (65 y.o. M) Treating RN: Cornell Barman Primary Care Provider: Eliezer Lofts Other Clinician: Jacqulyn Bath Referring Provider: Eliezer Lofts Treating Provider/Extender: Skipper Cliche in Treatment: 5 Diagnosis Coding ICD-10 Codes Code Description N30.41 Irradiation cystitis with hematuria N39.3 Stress incontinence (male) (male) Z60.46 Personal history of malignant neoplasm of prostate E11.69 Type 2 diabetes mellitus with other specified complication Facility Procedures CPT4 Code: 50277412 Description: (Facility Use Only) HBOT, full body chamber, 27mn Modifier: Quantity: 4 Physician  Procedures CPT4 Code: 68786767Description: [phone] PHYS HYPERBARIC OXYGEN THERAPY Modifier: Quantity: 1 CPT4 Code: Description: ICD-10 Diagnosis Description N30.41 Irradiation cystitis with hematuria Z85.46 Personal history of malignant neoplasm of prostate E11.69 Type 2 diabetes mellitus with other specified complication Modifier: Quantity: Electronic Signature(s) Signed: 08/05/2021 5:48:36 PM By: SWorthy KeelerPA-C Previous Signature: 08/05/2021 3:48:59 PM Version By: WEnedina FinnerRCP, RRT, CHT Entered By: SWorthy Keeleron 08/05/2021 17:48:36

## 2021-08-07 NOTE — Progress Notes (Signed)
KALVEN, GANIM ([id number]) Visit Report for 08/05/2021 HBO Details Patient Name: Dennis Hicks, Dennis Hicks Date of Service: 08/05/2021 8:00 AM Medical Record Number: [id number] Patient Account Number: [id number] Date of Birth/Sex: 1955-11-10 (66 y.o. 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 HBO Treatment Details Treatment Number: 24 Patient Type: Outpatient Chamber Type: Monoplace Chamber Serial #: [id number] Treatment Protocol: 2.5 ATA with 90 minutes oxygen, with two 5 minute air breaks Treatment Details Compression Rate Down: 1.5 psi / minute De-Compression Rate Up: 2.0 psi / minute Compress Tx Pressure Air breaks and breathing periods Decompress Decompress Begins Reached (leave unused spaces blank) Begins Ends Chamber Pressure (ATA) 1 2.5 2.5 2.5 2.5 2.5 - - 2.5 1 Clock Time (24 hr) 08:16 08:31 09:01 09:06 09:37 09:42 - - 10:12 10:23 Treatment Length: 127 (minutes) Treatment Segments: 4 Vital Signs Capillary Blood Glucose Reference Range: 80 - 120 mg / dl HBO Diabetic Blood Glucose Intervention Range: <131 mg/dl or >249 mg/dl Time Vitals Blood Respiratory Capillary Blood Glucose Pulse Action Type: Pulse: Temperature: Taken: Pressure: Rate: Glucose (mg/dl): Meter #: Oximetry (%) Taken: Pre 08:04 112/64 72 16 98.1 152 1 none per protocol Post 10:33 112/72 72 16 97.6 101 1 none per protocol Treatment Response Treatment Toleration: Well Treatment Completion Treatment Completed without Adverse Event Status: HBO Attestation I certify that I supervised this HBO treatment in accordance with Medicare guidelines. A trained emergency  response team is readily Yes available per hospital policies and procedures. Continue HBOT as ordered. Yes Electronic Signature(s) Signed: 08/05/2021 5:48:23 PM By: Worthy Keeler PA-C Previous Signature: 08/05/2021 3:48:59 PM Version By: Enedina Finner RCP, RRT, CHT Entered By: Worthy Keeler on 08/05/2021 17:48:23 COLEN, ELTZROTH ([id number]) -------------------------------------------------------------------------------- HBO Safety Checklist Details Patient Name: Dennis Hicks Date of Service: 08/05/2021 8:00 AM Medical Record Number: [id number] Patient Account Number: [id number] Date of Birth/Sex: [birth year] (66 y.o. M) Treating RN: Cornell Barman Primary Care Teriann Livingood: Eliezer Lofts Other Clinician: Jacqulyn Bath Referring Alaena Strader: Eliezer Lofts Treating Odesser Tourangeau/Extender: Yaakov Guthrie in Treatment: 5 HBO Safety Checklist Items Safety Checklist Consent Form Signed Patient voided / foley secured and emptied When did you last eato am Last dose of injectable or oral agent am Ostomy pouch emptied and vented if applicable NA All implantable devices assessed, documented and approved NA Intravenous access site secured and place NA Valuables secured Linens and cotton and cotton/polyester blend (less than 51% polyester) Personal oil-based products / skin lotions / body lotions removed Wigs or hairpieces removed NA Smoking or tobacco materials removed NA Books / newspapers / magazines / loose paper removed NA Cologne, aftershave, perfume and deodorant removed Jewelry removed (may wrap wedding band) Make-up removed NA Hair care products removed Battery operated devices (external) removed NA Heating patches and chemical warmers removed NA Titanium eyewear removed NA Nail polish cured greater than 10 hours NA Casting material cured greater than 10 hours NA Hearing aids removed NA Loose dentures or partials removed NA Prosthetics have been  removed NA Patient demonstrates correct use of air break device (if applicable) Patient concerns have been addressed Patient grounding bracelet on and cord attached to chamber Specifics for Inpatients (complete in addition to above) Medication sheet sent with patient Intravenous medications needed or  due during therapy sent with patient Drainage tubes (e.g. nasogastric tube or chest tube secured and vented) Endotracheal or Tracheotomy tube secured Cuff deflated of air and inflated with saline Airway suctioned Electronic Signature(s) Signed: 08/05/2021 3:48:59 PM By: Enedina Finner RCP, RRT, CHT Entered By: Enedina Finner on 08/05/2021 08:36:07

## 2021-08-07 NOTE — Progress Notes (Signed)
KENETH, BORG ([id number]) Visit Report for 08/06/2021 Problem List Details Patient Name: Dennis Hicks, Dennis Hicks Date of Service: 08/06/2021 8:00 AM Medical Record Number: [id number] Patient Account Number: [id number] Date of Birth/Sex: [birth year] (65 y.o. M) Treating RN: Cornell Barman Primary Care Provider: Eliezer Lofts Other Clinician: Jacqulyn Bath Referring Provider: Eliezer Lofts Treating Provider/Extender: Skipper Cliche in Treatment: 6 Active Problems ICD-10 Encounter Code Description Active Date MDM Diagnosis N30.41 Irradiation cystitis with hematuria 06/25/2021 No Yes N39.3 Stress incontinence (male) (male) 06/25/2021 No Yes Z85.46 Personal history of malignant neoplasm of prostate 06/25/2021 No Yes E11.69 Type 2 diabetes mellitus with other specified complication 11/24/1882 No Yes Inactive Problems Resolved Problems Electronic Signature(s) Signed: 08/06/2021 1:46:08 PM By: Worthy Keeler PA-C Entered By: Worthy Keeler on 08/06/2021 13:46:08 Dennis Hicks ([id number]) -------------------------------------------------------------------------------- SuperBill Details Patient Name: Dennis Hicks Date of Service: 08/06/2021 Medical Record Number: [id number] Patient Account Number: [id number] Date of Birth/Sex: [birth year] (65 y.o. M) Treating RN: Cornell Barman Primary Care Provider: Eliezer Lofts Other Clinician: Jacqulyn Bath Referring Provider: Eliezer Lofts Treating Provider/Extender: Skipper Cliche in Treatment: 6 Diagnosis Coding ICD-10 Codes Code Description N30.41 Irradiation cystitis with hematuria N39.3 Stress incontinence (male) (male) Z83.46 Personal history of malignant neoplasm of prostate E11.69 Type 2 diabetes mellitus with other specified complication Facility Procedures CPT4 Code: 73220254 Description: (Facility Use Only) HBOT, full body chamber, 75mn Modifier: Quantity: 4 Physician Procedures CPT4 Code: 62706237Description: [phone] PHYS HYPERBARIC  OXYGEN THERAPY Modifier: Quantity: 1 CPT4 Code: Description: ICD-10 Diagnosis Description N30.41 Irradiation cystitis with hematuria Z85.46 Personal history of malignant neoplasm of prostate E11.69 Type 2 diabetes mellitus with other specified complication Modifier: Quantity: Electronic Signature(s) Signed: 08/06/2021 1:46:21 PM By: SWorthy KeelerPA-C Previous Signature: 08/06/2021 1:02:26 PM Version By: WEnedina FinnerRCP, RRT, CHT Entered By: SWorthy Keeleron 08/06/2021 13:46:20

## 2021-08-09 ENCOUNTER — Encounter: Payer: 59 | Admitting: Physician Assistant

## 2021-08-09 DIAGNOSIS — N3041 Irradiation cystitis with hematuria: Secondary | ICD-10-CM | POA: Diagnosis not present

## 2021-08-09 LAB — GLUCOSE, CAPILLARY
Glucose-Capillary: 179 mg/dL — ABNORMAL HIGH (ref 70–99)
Glucose-Capillary: 98 mg/dL (ref 70–99)

## 2021-08-09 NOTE — Progress Notes (Signed)
MEAD, SLANE ([id number]) Visit Report for 08/09/2021 Arrival Information Details Patient Name: Dennis Hicks, Dennis Hicks Date of Service: 08/09/2021 8:00 AM Medical Record Number: [id number] Patient Account Number: [id number] Date of Birth/Sex: 1955-07-26 (65 y.o. M) Treating RN: Cornell Barman Primary Care Cosima Prentiss: Eliezer Lofts Other Clinician: Jacqulyn Bath Referring Jessee Mezera: Eliezer Lofts Treating Ladiamond Gallina/Extender: Skipper Cliche in Treatment: 6 Visit Information History Since Last Visit Added or deleted any medications: No Patient Arrived: Ambulatory Any new allergies or adverse reactions: No Arrival Time: 07:50 Had a fall or experienced change in No Accompanied By: self activities of daily living that may affect Transfer Assistance: None risk of falls: Patient Identification Verified: Yes Signs or symptoms of abuse/neglect since last visito No Secondary Verification Process Completed: Yes Hospitalized since last visit: No Patient Requires Transmission-Based Precautions: No Implantable device outside of the clinic excluding No Patient Has Alerts: No cellular tissue based products placed in the center since last visit: Pain Present Now: No Electronic Signature(s) Signed: 08/09/2021 1:56:23 PM By: Enedina Finner RCP, RRT, CHT Entered By: Enedina Finner on 08/09/2021 08:35:58 Dennis Hicks ([id number]) -------------------------------------------------------------------------------- Encounter Discharge Information Details Patient Name: Dennis Hicks Date of Service: 08/09/2021 8:00 AM Medical Record Number: [id number] Patient Account Number: [id number] Date of Birth/Sex: 04-Dec-1955 (65 y.o. M) Treating RN: Cornell Barman Primary Care Icela Glymph: Eliezer Lofts Other Clinician: Jacqulyn Bath Referring Infantof Villagomez: Eliezer Lofts Treating Reverie Vaquera/Extender: Skipper Cliche in Treatment: 6 Encounter Discharge Information Items Discharge Condition: Stable Ambulatory  Status: Ambulatory Discharge Destination: Home Transportation: Private Auto Accompanied By: self Schedule Follow-up Appointment: Yes Clinical Summary of Care: Notes Patient has an HBO treatment scheduled on 08/10/21 at 08:00. Electronic Signature(s) Signed: 08/09/2021 1:56:23 PM By: Enedina Finner RCP, RRT, CHT Entered By: Enedina Finner on 08/09/2021 10:59:49 Dennis Hicks, Dennis Hicks ([id number]) -------------------------------------------------------------------------------- Vitals Details Patient Name: Dennis Hicks Date of Service: 08/09/2021 8:00 AM Medical Record Number: [id number] Patient Account Number: [id number] Date of Birth/Sex: 03-30-1955 (65 y.o. M) Treating RN: Cornell Barman Primary Care Yanil Dawe: Eliezer Lofts Other Clinician: Jacqulyn Bath Referring Tandre Conly: Eliezer Lofts Treating Gracyn Santillanes/Extender: Skipper Cliche in Treatment: 6 Vital Signs Time Taken: 08:04 Temperature (F): 98.6 Height (in): 68 Pulse (bpm): 60 Weight (lbs): 185 Respiratory Rate (breaths/min): 16 Body Mass Index (BMI): 28.1 Blood Pressure (mmHg): 106/68 Capillary Blood Glucose (mg/dl): 179 Reference Range: 80 - 120 mg / dl Electronic Signature(s) Signed: 08/09/2021 1:56:23 PM By: Enedina Finner RCP, RRT, CHT Entered By: Enedina Finner on 08/09/2021 08:36:52

## 2021-08-09 NOTE — Progress Notes (Signed)
SAIR, FAULCON ([id number]) Visit Report for 08/09/2021 HBO Details Patient Name: Dennis Hicks, Dennis Hicks Date of Service: 08/09/2021 8:00 AM Medical Record Number: [id number] Patient Account Number: [id number] Date of Birth/Sex: 1955-08-15 (65 y.o. 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 HBO Treatment Details Treatment Number: 26 Patient Type: Outpatient Chamber Type: Monoplace Chamber Serial #: [id number] Treatment Protocol: 2.5 ATA with 90 minutes oxygen, with two 5 minute air breaks Treatment Details Compression Rate Down: 1.5 psi / minute De-Compression Rate Up: 2.0 psi / minute Compress Tx Pressure Air breaks and breathing periods Decompress Decompress Begins Reached (leave unused spaces blank) Begins Ends Chamber Pressure (ATA) 1 2.5 2.5 2.5 2.5 2.5 - - 2.5 1 Clock Time (24 hr) 08:14 08:29 09:00 09:05 09:35 09:40 - - 10:10 10:22 Treatment Length: 128 (minutes) Treatment Segments: 4 Vital Signs Capillary Blood Glucose Reference Range: 80 - 120 mg / dl HBO Diabetic Blood Glucose Intervention Range: <131 mg/dl or >249 mg/dl Time Vitals Blood Respiratory Capillary Blood Glucose Pulse Action Type: Pulse: Temperature: Taken: Pressure: Rate: Glucose (mg/dl): Meter #: Oximetry (%) Taken: Pre 08:04 106/68 60 16 98.6 179 1 none per protocol Post 10:30 122/72 72 16 97.8 98 1 none per protocol Treatment Response Treatment Toleration: Well Treatment Completion Treatment Completed without Adverse Event Status: Electronic Signature(s) Signed: 08/09/2021 1:56:23 PM By: Enedina Finner RCP, RRT, CHT Signed: 08/09/2021 4:26:20 PM By:  Worthy Keeler PA-C Entered By: Enedina Finner on 08/09/2021 10:57:28 Dennis Hicks ([id number]) -------------------------------------------------------------------------------- HBO Safety Checklist Details Patient Name: Dennis Hicks Date of Service: 08/09/2021 8:00 AM Medical Record Number: [id number] Patient Account Number: [id number] Date of Birth/Sex: 1955-05-02 (65 y.o. M) Treating RN: Cornell Barman Primary Care Jolon Degante: Eliezer Lofts Other Clinician: Jacqulyn Bath Referring Torres Hardenbrook: Eliezer Lofts Treating Chudney Scheffler/Extender: Skipper Cliche in Treatment: 6 HBO Safety Checklist Items Safety Checklist Consent Form Signed Patient voided / foley secured and emptied When did you last eato am Last dose of injectable or oral agent am Ostomy pouch emptied and vented if applicable NA All implantable devices assessed, documented and approved NA Intravenous access site secured and place NA Valuables secured Linens and cotton and cotton/polyester blend (less than 51% polyester) Personal oil-based products / skin lotions / body lotions removed Wigs or hairpieces removed NA Smoking or tobacco materials removed NA Books / newspapers / magazines / loose paper removed NA Cologne, aftershave, perfume and deodorant removed Jewelry removed (may wrap wedding band) Make-up removed NA Hair care products removed Battery operated devices (external) removed NA Heating patches and chemical warmers removed NA Titanium eyewear removed NA Nail polish cured greater than 10 hours NA Casting material cured greater than 10 hours NA Hearing aids removed NA Loose dentures or partials removed NA Prosthetics have been removed NA Patient demonstrates correct use of air break device (if applicable) Patient concerns have been addressed Patient grounding bracelet on and cord attached to chamber Specifics for Inpatients (complete in addition to above) Medication sheet sent with  patient Intravenous medications needed or due during therapy sent with patient Drainage tubes (e.g. nasogastric tube or chest tube secured and vented) Endotracheal or Tracheotomy tube secured Cuff deflated of air and inflated with saline Airway suctioned Electronic Signature(s) Signed: 08/09/2021  1:56:23 PM By: Enedina Finner RCP, RRT, CHT Entered By: Enedina Finner on 08/09/2021 08:37:56

## 2021-08-10 ENCOUNTER — Encounter: Payer: 59 | Admitting: Physician Assistant

## 2021-08-10 DIAGNOSIS — N3041 Irradiation cystitis with hematuria: Secondary | ICD-10-CM | POA: Diagnosis not present

## 2021-08-10 LAB — GLUCOSE, CAPILLARY
Glucose-Capillary: 198 mg/dL — ABNORMAL HIGH (ref 70–99)
Glucose-Capillary: 110 mg/dL — ABNORMAL HIGH (ref 70–99)

## 2021-08-10 NOTE — Progress Notes (Signed)
OLUWATIMILEHIN, BALFOUR ([id number]) Visit Report for 08/10/2021 Arrival Information Details Patient Name: Dennis Hicks, Dennis Hicks Date of Service: 08/10/2021 8:00 AM Medical Record Number: [id number] Patient Account Number: [id number] Date of Birth/Sex: 11-01-1955 (66 y.o. M) Treating RN: Cornell Barman Primary Care Randie Bloodgood: Eliezer Lofts Other Clinician: Jacqulyn Bath Referring Janelly Switalski: Eliezer Lofts Treating Fermina Mishkin/Extender: Skipper Cliche in Treatment: 6 Visit Information History Since Last Visit Added or deleted any medications: No Patient Arrived: Ambulatory Any new allergies or adverse reactions: No Arrival Time: 07:57 Had a fall or experienced change in No Accompanied By: self activities of daily living that may affect Transfer Assistance: None risk of falls: Patient Identification Verified: Yes Signs or symptoms of abuse/neglect since last visito No Secondary Verification Process Completed: Yes Hospitalized since last visit: No Patient Requires Transmission-Based Precautions: No Implantable device outside of the clinic excluding No Patient Has Alerts: No cellular tissue based products placed in the center since last visit: Pain Present Now: No Electronic Signature(s) Signed: 08/10/2021 11:50:43 AM By: Enedina Finner RCP, RRT, CHT Entered By: Enedina Finner on 08/10/2021 08:25:06 Dennis Hicks ([id number]) -------------------------------------------------------------------------------- Encounter Discharge Information Details Patient Name: Dennis Hicks Date of Service: 08/10/2021 8:00 AM Medical Record Number: [id number] Patient Account Number: [id number] Date of Birth/Sex: [birth year] (66 y.o. M) Treating RN: Cornell Barman Primary Care Topanga Alvelo: Eliezer Lofts Other Clinician: Jacqulyn Bath Referring Francie Keeling: Eliezer Lofts Treating Etheleen Valtierra/Extender: Skipper Cliche in Treatment: 6 Encounter Discharge Information Items Discharge Condition: Stable Ambulatory  Status: Ambulatory Discharge Destination: Home Transportation: Private Auto Accompanied By: self Schedule Follow-up Appointment: Yes Clinical Summary of Care: Notes Patient has an HBO treatment scheduled on 08/11/21 at 08:00 am. Electronic Signature(s) Signed: 08/10/2021 11:50:43 AM By: Enedina Finner RCP, RRT, CHT Entered By: Enedina Finner on 08/10/2021 11:50:05 Dennis Hicks ([id number]) -------------------------------------------------------------------------------- Vitals Details Patient Name: Dennis Hicks Date of Service: 08/10/2021 8:00 AM Medical Record Number: [id number] Patient Account Number: [id number] Date of Birth/Sex: 09/07/1955 (66 y.o. M) Treating RN: Cornell Barman Primary Care Tanique Matney: Eliezer Lofts Other Clinician: Jacqulyn Bath Referring Tanina Barb: Eliezer Lofts Treating Imogean Ciampa/Extender: Skipper Cliche in Treatment: 6 Vital Signs Time Taken: 07:59 Temperature (F): 98.6 Height (in): 68 Pulse (bpm): 78 Weight (lbs): 185 Respiratory Rate (breaths/min): 16 Body Mass Index (BMI): 28.1 Blood Pressure (mmHg): 106/66 Capillary Blood Glucose (mg/dl): 198 Reference Range: 80 - 120 mg / dl Electronic Signature(s) Signed: 08/10/2021 11:50:43 AM By: Enedina Finner RCP, RRT, CHT Entered By: Enedina Finner on 08/10/2021 08:25:39

## 2021-08-11 ENCOUNTER — Encounter (HOSPITAL_BASED_OUTPATIENT_CLINIC_OR_DEPARTMENT_OTHER): Payer: 59 | Admitting: Internal Medicine

## 2021-08-11 DIAGNOSIS — Z8546 Personal history of malignant neoplasm of prostate: Secondary | ICD-10-CM

## 2021-08-11 DIAGNOSIS — E1169 Type 2 diabetes mellitus with other specified complication: Secondary | ICD-10-CM

## 2021-08-11 DIAGNOSIS — N3041 Irradiation cystitis with hematuria: Secondary | ICD-10-CM

## 2021-08-11 LAB — GLUCOSE, CAPILLARY
Glucose-Capillary: 179 mg/dL — ABNORMAL HIGH (ref 70–99)
Glucose-Capillary: 87 mg/dL (ref 70–99)

## 2021-08-11 NOTE — Progress Notes (Signed)
ABDELRAHMAN, NAIR ([id number]) Visit Report for 08/10/2021 HBO Details Patient Name: Dennis Hicks, Dennis Hicks Date of Service: 08/10/2021 8:00 AM Medical Record Number: [id number] Patient Account Number: [id number] Date of Birth/Sex: 11/08/1955 (65 y.o. 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 HBO Treatment Details Treatment Number: 27 Patient Type: Outpatient Chamber Type: Monoplace Chamber Serial #: [id number] Treatment Protocol: 2.5 ATA with 90 minutes oxygen, with two 5 minute air breaks Treatment Details Compression Rate Down: 1.5 psi / minute De-Compression Rate Up: 2.0 psi / minute Compress Tx Pressure Air breaks and breathing periods Decompress Decompress Begins Reached (leave unused spaces blank) Begins Ends Chamber Pressure (ATA) 1 2.5 2.5 2.5 2.5 2.5 - - 2.5 1 Clock Time (24 hr) 08:19 08:34 09:04 09:09 09:39 09:44 - - 10:14 10:25 Treatment Length: 126 (minutes) Treatment Segments: 4 Vital Signs Capillary Blood Glucose Reference Range: 80 - 120 mg / dl HBO Diabetic Blood Glucose Intervention Range: <131 mg/dl or >249 mg/dl Time Vitals Blood Respiratory Capillary Blood Glucose Pulse Action Type: Pulse: Temperature: Taken: Pressure: Rate: Glucose (mg/dl): Meter #: Oximetry (%) Taken: Pre 07:59 106/66 78 16 98.6 198 1 none per protocol Post 10:34 112/76 60 16 97.7 110 1 none per protocol Treatment Response Treatment Toleration: Well Treatment Completion Treatment Completed without Adverse Event Status: Electronic Signature(s) Signed: 08/10/2021 11:50:43 AM By: Enedina Finner RCP, RRT, CHT Signed: 08/11/2021 5:52:44 PM  By: Worthy Keeler PA-C Entered By: Enedina Finner on 08/10/2021 11:49:08 Dennis Hicks ([id number]) -------------------------------------------------------------------------------- HBO Safety Checklist Details Patient Name: Dennis Hicks Date of Service: 08/10/2021 8:00 AM Medical Record Number: [id number] Patient Account Number: [id number] Date of Birth/Sex: 13-Jan-1956 (65 y.o. M) Treating RN: Cornell Barman Primary Care Kriston Pasquarello: Eliezer Lofts Other Clinician: Jacqulyn Bath Referring Brentlee Delage: Eliezer Lofts Treating Deneka Greenwalt/Extender: Skipper Cliche in Treatment: 6 HBO Safety Checklist Items Safety Checklist Consent Form Signed Patient voided / foley secured and emptied When did you last eato am Last dose of injectable or oral agent am Ostomy pouch emptied and vented if applicable NA All implantable devices assessed, documented and approved NA Intravenous access site secured and place NA Valuables secured Linens and cotton and cotton/polyester blend (less than 51% polyester) Personal oil-based products / skin lotions / body lotions removed Wigs or hairpieces removed NA Smoking or tobacco materials removed NA Books / newspapers / magazines / loose paper removed NA Cologne, aftershave, perfume and deodorant removed Jewelry removed (may wrap wedding band) Make-up removed NA Hair care products removed Battery operated devices (external) removed NA Heating patches and chemical warmers removed NA Titanium eyewear removed NA Nail polish cured greater than 10 hours NA Casting material cured greater than 10 hours NA Hearing aids removed NA Loose dentures or partials removed NA Prosthetics have been removed NA Patient demonstrates correct use of air break device (if applicable) Patient concerns have been addressed Patient grounding bracelet on and cord attached to chamber Specifics for Inpatients (complete in addition to above) Medication sheet sent  with patient Intravenous medications needed or due during therapy sent with patient Drainage tubes (e.g. nasogastric tube or chest tube secured and vented) Endotracheal or Tracheotomy tube secured Cuff deflated of air and inflated with saline Airway suctioned Electronic Signature(s) Signed: 08/10/2021  11:50:43 AM By: Enedina Finner RCP, RRT, CHT Entered By: Enedina Finner on 08/10/2021 08:26:50

## 2021-08-11 NOTE — Progress Notes (Addendum)
ANGELES, PAOLUCCI ([id number]) Visit Report for 08/11/2021 Arrival Information Details Patient Name: CODI, KERTZ Date of Service: 08/11/2021 8:00 AM Medical Record Number: [id number] Patient Account Number: [id number] Date of Birth/Sex: 08/22/1955 (65 y.o. M) Treating RN: Cornell Barman Primary Care Andraya Frigon: Eliezer Lofts Other Clinician: Jacqulyn Bath Referring Caz Weaver: Eliezer Lofts Treating Jenell Dobransky/Extender: Yaakov Guthrie in Treatment: 6 Visit Information History Since Last Visit Pain Present Now: No Patient Arrived: Ambulatory Arrival Time: 08:13 Transfer Assistance: None Patient Identification Verified: Yes Secondary Verification Process Completed: Yes Patient Requires Transmission-Based Precautions: No Patient Has Alerts: No Electronic Signature(s) Signed: 08/11/2021 8:13:16 AM By: Gretta Cool, BSN, RN, CWS, Kim RN, BSN Entered By: Gretta Cool, BSN, RN, CWS, Kim on 08/11/2021 08:13:15 [name] ([id number]) -------------------------------------------------------------------------------- Encounter Discharge Information Details Patient Name: [name] Date of Service: 08/11/2021 8:00 AM Medical Record Number: [id number] Patient Account Number: [id number] Date of Birth/Sex: May 02, 1955 (65 y.o. M) Treating RN: Cornell Barman Primary Care Zelig Gacek: Eliezer Lofts Other Clinician: Jacqulyn Bath Referring Bonnie Overdorf: Eliezer Lofts Treating Nadine Ryle/Extender: Yaakov Guthrie in Treatment: 6 Encounter Discharge Information Items Discharge Condition: Stable Ambulatory Status: Ambulatory Discharge Destination: Home Transportation: Private Auto Accompanied By: self Schedule Follow-up Appointment: Yes Clinical Summary of Care: Electronic Signature(s) Signed: 08/11/2021 11:19:43 AM By: Gretta Cool, BSN, RN, CWS, Kim RN, BSN Entered By: Gretta Cool, BSN, RN, CWS, Kim on 08/11/2021 11:19:43 [name]  ([id number]) -------------------------------------------------------------------------------- Vitals Details Patient Name: [name] Date of Service: 08/11/2021 8:00 AM Medical Record Number: [id number] Patient Account Number: [id number] Date of Birth/Sex: 07/11/1955 (65 y.o. M) Treating RN: Cornell Barman Primary Care Ayrianna Mcginniss: Eliezer Lofts Other Clinician: Jacqulyn Bath Referring Hektor Huston: Eliezer Lofts Treating Mayerli Kirst/Extender: Yaakov Guthrie in Treatment: 6 Vital Signs Time Taken: 08:10 Temperature (F): 98.1 Height (in): 68 Pulse (bpm): 68 Weight (lbs): 185 Respiratory Rate (breaths/min): 16 Body Mass Index (BMI): 28.1 Blood Pressure (mmHg): 120/78 Capillary Blood Glucose (mg/dl): 178 Reference Range: 80 - 120 mg / dl Electronic Signature(s) Signed: 08/11/2021 8:13:41 AM By: Gretta Cool, BSN, RN, CWS, Kim RN, BSN Entered By: Gretta Cool, BSN, RN, CWS, Kim on 08/11/2021 08:13:40

## 2021-08-11 NOTE — Progress Notes (Addendum)
Dennis Hicks, Dennis Hicks ([id number]) Visit Report for 08/11/2021 HBO Details Patient Name: Dennis Hicks, Dennis Hicks Date of Service: 08/11/2021 8:00 AM Medical Record Number: [id number] Patient Account Number: [id number] Date of Birth/Sex: 02-22-1956 (65 y.o. 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 HBO Treatment Details Treatment Number: 28 Patient Type: Outpatient Chamber Type: Monoplace Chamber Serial #: [id number] Treatment Protocol: 2.5 ATA with 90 minutes oxygen, with two 5 minute air breaks Treatment Details Compression Rate Down: 1.5 psi / minute De-Compression Rate Up: 1.5 psi / minute Compress Tx Pressure Air breaks and breathing periods Decompress Decompress Begins Reached (leave unused spaces blank) Begins Ends Chamber Pressure (ATA) 1 2.5 2.5 2.5 2.5 2.5 - - 2.5 1 Clock Time (24 hr) 08:28 08:44 09:15 09:20 09:51 09:56 - - 10:27 10:43 Treatment Length: 135 (minutes) Treatment Segments: 4 Vital Signs Capillary Blood Glucose Reference Range: 80 - 120 mg / dl HBO Diabetic Blood Glucose Intervention Range: <131 mg/dl or >249 mg/dl Time Vitals Blood Respiratory Capillary Blood Glucose Pulse Action Type: Pulse: Temperature: Taken: Pressure: Rate: Glucose (mg/dl): Meter #: Oximetry (%) Taken: Pre 08:10 120/78 68 16 98.1 178 Post 10:45 130/80 78 16 97.7 87 patient asymptomatic left clinic Treatment Response Treatment Toleration: Well Treatment Completion Treatment Completed without Adverse Event Status: Jenavieve Freda Notes Patient finished his HBO treatment and had a glucose level of 87. He had a small can of ginger ale. Protocol is to wait  15 minutes after consuming beverage to watch for signs of hypoglycemia. Patient declined to stay 15 minutes. Signs and symptoms of hypoglycemia were discussed with the patient. He understands the risks of leaving. HBO Attestation I certify that I supervised this HBO treatment in accordance with Medicare guidelines. A trained emergency response team is readily Yes available per hospital policies and procedures. Continue HBOT as ordered. Yes Electronic Signature(s) Signed: 08/11/2021 2:24:38 PM By: Kalman Shan DO Previous Signature: 08/11/2021 11:19:18 AM Version By: Gretta Cool, BSN, RN, CWS, Kim RN, BSN Previous Signature: 08/11/2021 11:05:14 AM Version By: Gretta Cool, BSN, RN, CWS, Kim RN, BSN Previous Signature: 08/11/2021 10:33:16 AM Version By: Gretta Cool, BSN, RN, CWS, Kim RN, BSN Previous Signature: 08/11/2021 9:57:02 AM Version By: Gretta Cool, BSN, RN, CWS, Kim RN, BSN Previous Signature: 08/11/2021 9:20:53 AM Version By: Gretta Cool, BSN, RN, CWS, Kim RN, BSN Previous Signature: 08/11/2021 8:47:05 AM Version By: Gretta Cool, BSN, RN, CWS, Kim RN, BSN Previous Signature: 08/11/2021 8:29:51 AM Version By: Gretta Cool BSN, RN, CWS, Kim RN, BSN Hastings, Dawson ([id number]) Entered By: Kalman Shan on 08/11/2021 14:24:06 Dennis Hicks, Dennis Hicks ([id number]) -------------------------------------------------------------------------------- HBO Safety Checklist Details Patient Name: Dennis Hicks Date of Service: 08/11/2021 8:00 AM Medical Record Number: [id number] Patient Account Number: [id number] Date of Birth/Sex: Apr 03, 1955 (65 y.o. M) Treating RN: Cornell Barman Primary Care Vernita Tague: Eliezer Lofts Other Clinician: Jacqulyn Bath Referring Raylinn Kosar: Eliezer Lofts Treating Azell Bill/Extender: Yaakov Guthrie in Treatment: 6 HBO Safety Checklist Items Safety Checklist Consent Form Signed Patient voided / foley secured and emptied When did you last eato 6:45am Last dose of injectable or oral agent am Ostomy pouch emptied and  vented if applicable NA All implantable devices assessed, documented and approved NA Intravenous access site secured and place NA Valuables secured Linens and cotton and cotton/polyester blend (less  than 51% polyester) Personal oil-based products / skin lotions / body lotions removed Wigs or hairpieces removed NA Smoking or tobacco materials removed NA Books / newspapers / magazines / loose paper removed NA Cologne, aftershave, perfume and deodorant removed Jewelry removed (may wrap wedding band) NA Make-up removed NA Hair care products removed NA Battery operated devices (external) removed NA Heating patches and chemical warmers removed NA Titanium eyewear removed NA Nail polish cured greater than 10 hours NA Casting material cured greater than 10 hours NA Hearing aids removed NA Loose dentures or partials removed NA Prosthetics have been removed NA Patient demonstrates correct use of air break device (if applicable) Patient concerns have been addressed Patient grounding bracelet on and cord attached to chamber Specifics for Inpatients (complete in addition to above) Medication sheet sent with patient Intravenous medications needed or due during therapy sent with patient Drainage tubes (e.g. nasogastric tube or chest tube secured and vented) Endotracheal or Tracheotomy tube secured Cuff deflated of air and inflated with saline Airway suctioned Electronic Signature(s) Signed: 08/11/2021 8:15:14 AM By: Gretta Cool, BSN, RN, CWS, Kim RN, BSN Entered By: Gretta Cool, BSN, RN, CWS, Kim on 08/11/2021 08:15:13

## 2021-08-11 NOTE — Progress Notes (Signed)
Dennis Hicks, Dennis Hicks ([id number]) Visit Report for 08/11/2021 Physician Orders Details Patient Name: Dennis Hicks Date of Service: 08/11/2021 8:00 AM Medical Record Number: [id number] Patient Account Number: [id number] Date of Birth/Sex: [birth year] (65 y.o. M) Treating RN: Cornell Barman Primary Care Provider: Eliezer Lofts Other Clinician: Jacqulyn Bath Referring Provider: Eliezer Lofts Treating Provider/Extender: Yaakov Guthrie in Treatment: 6 Verbal / Phone Orders: No Diagnosis Coding Follow-up Appointments o Other: - continue HBO as directed till completion as patient is tolerating this well at this point and symptoms are improving Hyperbaric Oxygen Therapy o Evaluate for HBO Therapy o Indication and location: - radiation cystitis o If appropriate for treatment, begin HBOT per protocol: o 2.5 ATA for 90 Minutes with 2 Five (5) Minute Air Breaks o One treatment per day (delivered Monday through Friday unless otherwise specified in Special Instructions below): o Total # of Treatments: - 40 o Antihistamine 30 minutes prior to HBO Treatment, difficulty clearing ears. o Finger stick Blood Glucose Pre- and Post- HBOT Treatment. o Follow Hyperbaric Oxygen Glycemia Protocol GLYCEMIA INTERVENTIONS PROTOCOL PRE-HBO GLYCEMIA INTERVENTIONS ACTION INTERVENTION Obtain pre-HBO capillary blood glucose (ensure 1 physician order is in chart). A. Notify HBO physician and await physician orders. 2 If result is 70 mg/dl or below: B. If the result meets the hospital definition of a critical result, follow hospital policy. A. Give patient an 8 ounce Glucerna Shake, an 8 ounce Ensure, or 8 ounces of a Glucerna/Ensure equivalent dietary supplement*. B. Wait 30 minutes. If result is 71 mg/dl to 130 mg/dl: C. Retest patientos capillary blood glucose (CBG). D. If result greater than or equal to 110 mg/dl, proceed with HBO. If result less than 110 mg/dl, notify  HBO physician and consider holding HBO. If result is 131 mg/dl to 249 mg/dl: A. Proceed with HBO. A. Notify HBO physician and await physician orders. B. It is recommended to hold HBO and do If result is 250 mg/dl or greater: blood/urine ketone testing. C. If the result meets the hospital definition of a critical result, follow hospital policy. POST-HBO GLYCEMIA INTERVENTIONS ACTION INTERVENTION Obtain post HBO capillary blood glucose (ensure 1 physician order is in chart). A. Notify HBO physician and await physician orders. 2 If result is 70 mg/dl or below: B. If the result meets the hospital definition of a critical result, follow hospital policy. If result is 71 mg/dl to 100 mg/dl: A. Give patient an 8 ounce Glucerna Shake, an 8 ounce Ensure, or 8 ounces of a Glucerna/Ensure equivalent dietary supplement*. B. Wait 15 minutes for symptoms of hypoglycemia (i.e. nervousness, anxiety, sweating, chills, clamminess, irritability, confusion, tachycardia or dizziness). Dennis Hicks, Dennis F. ([id number]) C. If patient asymptomatic, discharge patient. If patient symptomatic, repeat capillary blood glucose (CBG) and notify HBO physician. If result is 101 mg/dl to 249 mg/dl: A. Discharge patient. A. Notify HBO physician and await physician orders. B. It is recommended to do blood/urine If result is 250 mg/dl or greater: ketone testing. C. If the result meets the hospital definition of a critical result, follow hospital policy. *Juice or candies are NOT equivalent products. If patient refuses the Glucerna or Ensure, please consult the hospital dietitian for an appropriate substitute. Electronic Signature(s) Signed: 08/11/2021 11:39:32 AM By: Gretta Cool, BSN, RN, CWS, Kim RN, BSN Signed: 08/11/2021 2:24:38 PM By: Kalman Shan DO Entered By: Gretta Cool BSN, RN, CWS, Kim on 08/11/2021 11:39:31 Dennis Hicks, Dennis Hicks  ([id number]) -------------------------------------------------------------------------------- SuperBill Details Patient Name: Dennis Hicks Date of Service: 08/11/2021 Medical Record Number: [id number] Patient Account Number:  [id number] Date of Birth/Sex: 06-18-1955 (66 y.o. M) Treating RN: Cornell Barman Primary Care Provider: Eliezer Lofts Other Clinician: Jacqulyn Bath Referring Provider: Eliezer Lofts Treating Provider/Extender: Yaakov Guthrie in Treatment: 6 Diagnosis Coding ICD-10 Codes Code Description N30.41 Irradiation cystitis with hematuria N39.3 Stress incontinence (male) (male) Z45.46 Personal history of malignant neoplasm of prostate E11.69 Type 2 diabetes mellitus with other specified complication Facility Procedures CPT4 Code: 41660630 Description: (Facility Use Only) HBOT, full body chamber, 64mn Modifier: Quantity: 4 Physician Procedures CPT4 Code: 61601093Description: [id number]- WC PHYS HYPERBARIC OXYGEN THERAPY Modifier: Quantity: 1 CPT4 Code: Description: ICD-10 Diagnosis Description N30.41 Irradiation cystitis with hematuria Z85.46 Personal history of malignant neoplasm of prostate E11.69 Type 2 diabetes mellitus with other specified complication Modifier: Quantity: Electronic Signature(s) Signed: 08/11/2021 11:39:10 AM By: WGretta Cool BSN, RN, CWS, Kim RN, BSN Signed: 08/11/2021 2:24:38 PM By: HKalman ShanDO Entered By: WGretta Cool BSN, RN, CWS, Kim on 08/11/2021 11:39:09

## 2021-08-12 ENCOUNTER — Encounter: Payer: 59 | Admitting: Physician Assistant

## 2021-08-12 DIAGNOSIS — N3041 Irradiation cystitis with hematuria: Secondary | ICD-10-CM | POA: Diagnosis not present

## 2021-08-12 LAB — GLUCOSE, CAPILLARY
Glucose-Capillary: 168 mg/dL — ABNORMAL HIGH (ref 70–99)
Glucose-Capillary: 99 mg/dL (ref 70–99)

## 2021-08-12 NOTE — Progress Notes (Signed)
ERINN, HUSKINS ([id number]) Visit Report for 08/12/2021 Arrival Information Details Patient Name: Dennis Hicks, Dennis Hicks Date of Service: 08/12/2021 8:00 AM Medical Record Number: [id number] Patient Account Number: [id number] Date of Birth/Sex: 16-Aug-1955 (65 y.o. M) Treating RN: Cornell Barman Primary Care Kaleo Condrey: Eliezer Lofts Other Clinician: Jacqulyn Bath Referring Jessic Standifer: Eliezer Lofts Treating Zyaire Dumas/Extender: Skipper Cliche in Treatment: 6 Visit Information History Since Last Visit Added or deleted any medications: No Patient Arrived: Ambulatory Any new allergies or adverse reactions: No Arrival Time: 08:00 Had a fall or experienced change in No Accompanied By: self activities of daily living that may affect Transfer Assistance: None risk of falls: Patient Identification Verified: Yes Signs or symptoms of abuse/neglect since last visito No Secondary Verification Process Completed: Yes Hospitalized since last visit: No Patient Requires Transmission-Based Precautions: No Pain Present Now: No Patient Has Alerts: No Electronic Signature(s) Signed: 08/12/2021 1:07:42 PM By: Enedina Finner RCP, RRT, CHT Entered By: Enedina Finner on 08/12/2021 08:20:13 Dennis Hicks ([id number]) -------------------------------------------------------------------------------- Encounter Discharge Information Details Patient Name: Dennis Hicks Date of Service: 08/12/2021 8:00 AM Medical Record Number: [id number] Patient Account Number: [id number] Date of Birth/Sex: Aug 25, 1955 (65 y.o. M) Treating RN: Cornell Barman Primary Care Lashay Osborne: Eliezer Lofts Other Clinician: Jacqulyn Bath Referring Tamilyn Lupien: Eliezer Lofts Treating Mardy Hoppe/Extender: Skipper Cliche in Treatment: 6 Encounter Discharge Information Items Discharge Condition: Stable Ambulatory Status: Ambulatory Discharge Destination: Home Transportation: Private Auto Accompanied By: self Schedule Follow-up  Appointment: Yes Clinical Summary of Care: Notes Patient has an HBO treatment scheduled on 08/13/21 at 08:00 am. Electronic Signature(s) Signed: 08/12/2021 1:07:42 PM By: Enedina Finner RCP, RRT, CHT Entered By: Enedina Finner on 08/12/2021 13:04:47 Dennis Hicks ([id number]) -------------------------------------------------------------------------------- Vitals Details Patient Name: Dennis Hicks Date of Service: 08/12/2021 8:00 AM Medical Record Number: [id number] Patient Account Number: [id number] Date of Birth/Sex: [birth year] (65 y.o. M) Treating RN: Cornell Barman Primary Care Kaniya Trueheart: Eliezer Lofts Other Clinician: Jacqulyn Bath Referring Lagina Reader: Eliezer Lofts Treating Brittanyann Wittner/Extender: Skipper Cliche in Treatment: 6 Vital Signs Time Taken: 08:03 Temperature (F): 98.1 Height (in): 68 Pulse (bpm): 84 Weight (lbs): 185 Respiratory Rate (breaths/min): 16 Body Mass Index (BMI): 28.1 Blood Pressure (mmHg): 112/68 Capillary Blood Glucose (mg/dl): 168 Reference Range: 80 - 120 mg / dl Electronic Signature(s) Signed: 08/12/2021 1:07:42 PM By: Enedina Finner RCP, RRT, CHT Entered By: Enedina Finner on 08/12/2021 08:20:56

## 2021-08-13 ENCOUNTER — Encounter: Payer: 59 | Admitting: Physician Assistant

## 2021-08-13 DIAGNOSIS — N3041 Irradiation cystitis with hematuria: Secondary | ICD-10-CM | POA: Diagnosis not present

## 2021-08-13 LAB — GLUCOSE, CAPILLARY
Glucose-Capillary: 182 mg/dL — ABNORMAL HIGH (ref 70–99)
Glucose-Capillary: 106 mg/dL — ABNORMAL HIGH (ref 70–99)

## 2021-08-13 NOTE — Progress Notes (Signed)
MASSIMILIANO, ROHLEDER ([id number]) Visit Report for 08/13/2021 HBO Details Patient Name: Dennis Hicks, Dennis Hicks Date of Service: 08/13/2021 8:00 AM Medical Record Number: [id number] Patient Account Number: [id number] Date of Birth/Sex: [birth year] (65 y.o. 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 HBO Treatment Details Treatment Number: 30 Patient Type: Outpatient Chamber Type: Monoplace Chamber Serial #: [id number] Treatment Protocol: 2.5 ATA with 90 minutes oxygen, with two 5 minute air breaks Treatment Details Compression Rate Down: 1.5 psi / minute De-Compression Rate Up: 2.0 psi / minute Compress Tx Pressure Air breaks and breathing periods Decompress Decompress Begins Reached (leave unused spaces blank) Begins Ends Chamber Pressure (ATA) 1 2.5 2.5 2.5 2.5 2.5 - - 2.5 1 Clock Time (24 hr) 08:14 08:29 09:00 09:05 09:35 09:41 - - 10:11 10:23 Treatment Length: 129 (minutes) Treatment Segments: 4 Vital Signs Capillary Blood Glucose Reference Range: 80 - 120 mg / dl HBO Diabetic Blood Glucose Intervention Range: <131 mg/dl or >249 mg/dl Time Vitals Blood Respiratory Capillary Blood Glucose Pulse Action Type: Pulse: Temperature: Taken: Pressure: Rate: Glucose (mg/dl): Meter #: Oximetry (%) Taken: Pre 08:07 105/62 72 16 98.7 182 1 none per protocol Post 10:38 112/64 66 16 98 106 1 none per protocol Treatment Response Treatment Toleration: Well Treatment Completion Treatment Completed without Adverse Event Status: HBO Attestation I certify that I supervised this HBO treatment in accordance with Medicare guidelines. A trained emergency  response team is readily Yes available per hospital policies and procedures. Continue HBOT as ordered. Yes Electronic Signature(s) Signed: 08/13/2021 4:54:17 PM By: Worthy Keeler PA-C Previous Signature: 08/13/2021 1:41:37 PM Version By: Enedina Finner RCP, RRT, CHT Entered By: Worthy Keeler on 08/13/2021 16:54:17 Dennis Hicks ([id number]) -------------------------------------------------------------------------------- HBO Safety Checklist Details Patient Name: Dennis Hicks Date of Service: 08/13/2021 8:00 AM Medical Record Number: [id number] Patient Account Number: [id number] Date of Birth/Sex: 1955-07-23 (65 y.o. M) Treating RN: Cornell Barman Primary Care Nyeema Want: Eliezer Lofts Other Clinician: Jacqulyn Bath Referring Eulalie Speights: Eliezer Lofts Treating Wallis Vancott/Extender: Skipper Cliche in Treatment: 7 HBO Safety Checklist Items Safety Checklist Consent Form Signed Patient voided / foley secured and emptied When did you last eato am Last dose of injectable or oral agent am Ostomy pouch emptied and vented if applicable NA All implantable devices assessed, documented and approved NA Intravenous access site secured and place NA Valuables secured Linens and cotton and cotton/polyester blend (less than 51% polyester) Personal oil-based products / skin lotions / body lotions removed Wigs or hairpieces removed NA Smoking or tobacco materials removed NA Books / newspapers / magazines / loose paper removed NA Cologne, aftershave, perfume and deodorant removed Jewelry removed (may wrap wedding band) Make-up removed NA Hair care products removed Battery operated devices (external) removed NA Heating patches and chemical warmers removed NA Titanium eyewear removed NA Nail polish cured greater than 10 hours NA Casting material cured greater than 10 hours NA Hearing aids removed NA Loose dentures or partials removed NA Prosthetics have been  removed NA Patient demonstrates correct use of air break device (if applicable) Patient concerns have been addressed Patient grounding bracelet on and cord attached to chamber Specifics for Inpatients (complete in addition to above) Medication sheet sent with patient Intravenous medications needed or  due during therapy sent with patient Drainage tubes (e.g. nasogastric tube or chest tube secured and vented) Endotracheal or Tracheotomy tube secured Cuff deflated of air and inflated with saline Airway suctioned Electronic Signature(s) Signed: 08/13/2021 1:41:37 PM By: Enedina Finner RCP, RRT, CHT Entered By: Enedina Finner on 08/13/2021 08:51:21

## 2021-08-13 NOTE — Progress Notes (Signed)
ANAN, DAPOLITO ([id number]) Visit Report for 08/13/2021 Problem List Details Patient Name: AB, [name] Date of Service: 08/13/2021 8:00 AM Medical Record Number: [id number] Patient Account Number: [id number] Date of Birth/Sex: 09-01-1955 (65 y.o. 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 06/25/2021 No Yes N39.3 Stress incontinence (male) (male) 06/25/2021 No Yes Z85.46 Personal history of malignant neoplasm of prostate 06/25/2021 No Yes E11.69 Type 2 diabetes mellitus with other specified complication 05/23/3566 No Yes Inactive Problems Resolved Problems Electronic Signature(s) Signed: 08/13/2021 4:54:11 PM By: Worthy Keeler PA-C Entered By: Worthy Keeler on 08/13/2021 16:54:10 [name] ([id number]) -------------------------------------------------------------------------------- SuperBill Details Patient Name: [name] Date of Service: 08/13/2021 Medical Record Number: [id number] Patient Account Number: [id number] Date of Birth/Sex: [birth year] (65 y.o. 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 N39.3 Stress incontinence (male) (male) Z66.46 Personal history of malignant neoplasm of prostate E11.69 Type 2 diabetes mellitus with other specified complication Facility Procedures CPT4 Code: 02233612 Description: (Facility Use Only) HBOT, full body chamber, 27mn Modifier: Quantity: 4 Physician Procedures CPT4 Code: 62449753Description: [id number] PHYS HYPERBARIC  OXYGEN THERAPY Modifier: Quantity: 1 CPT4 Code: Description: ICD-10 Diagnosis Description N30.41 Irradiation cystitis with hematuria Z85.46 Personal history of malignant neoplasm of prostate E11.69 Type 2 diabetes mellitus with other specified complication Modifier: Quantity: Electronic Signature(s) Signed: 08/13/2021 4:54:22 PM By: SWorthy KeelerPA-C Previous Signature: 08/13/2021 1:41:37 PM Version By: WEnedina FinnerRCP, RRT, CHT Entered By: SWorthy Keeleron 08/13/2021 16:54:21

## 2021-08-13 NOTE — Progress Notes (Signed)
TORIBIO, SEIBER ([id number]) Visit Report for 08/12/2021 HBO Details Patient Name: Dennis Hicks, Dennis Hicks Date of Service: 08/12/2021 8:00 AM Medical Record Number: [id number] Patient Account Number: [id number] Date of Birth/Sex: November 01, 1955 (65 y.o. 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 HBO Treatment Details Treatment Number: 29 Patient Type: Outpatient Chamber Type: Monoplace Chamber Serial #: [id number] Treatment Protocol: 2.5 ATA with 90 minutes oxygen, with two 5 minute air breaks Treatment Details Compression Rate Down: 1.5 psi / minute De-Compression Rate Up: 2.0 psi / minute Compress Tx Pressure Air breaks and breathing periods Decompress Decompress Begins Reached (leave unused spaces blank) Begins Ends Chamber Pressure (ATA) 1 2.5 2.5 2.5 2.5 2.5 - - 2.5 1 Clock Time (24 hr) 08:15 08:30 09:00 09:06 09:36 09:41 - - 10:11 10:23 Treatment Length: 128 (minutes) Treatment Segments: 4 Vital Signs Capillary Blood Glucose Reference Range: 80 - 120 mg / dl HBO Diabetic Blood Glucose Intervention Range: <131 mg/dl or >249 mg/dl Time Capillary Blood Glucose Pulse Blood Respiratory Action Type: Vitals Pulse: Temperature: Glucose Meter Oximetry Pressure: Rate: Taken: Taken: (mg/dl): #: (%) Pre 08:03 112/68 84 16 98.1 168 1 none per protocol none per protocol; no adverse Post 10:34 112/66 60 16 97.7 99 1 symptoms Treatment Response Treatment Toleration: Well Treatment Completion Treatment Completed without Adverse Event Status: Electronic Signature(s) Signed: 08/12/2021 1:07:42 PM By: Enedina Finner RCP, RRT, CHT Signed:  08/13/2021 4:54:58 PM By: Worthy Keeler PA-C Entered By: Enedina Finner on 08/12/2021 13:03:48 Dennis Hicks ([id number]) -------------------------------------------------------------------------------- HBO Safety Checklist Details Patient Name: Dennis Hicks Date of Service: 08/12/2021 8:00 AM Medical Record Number: [id number] Patient Account Number: [id number] Date of Birth/Sex: November 17, 1955 (65 y.o. M) Treating RN: Cornell Barman Primary Care Yousra Ivens: Eliezer Lofts Other Clinician: Jacqulyn Bath Referring Favor Kreh: Eliezer Lofts Treating Carah Barrientes/Extender: Skipper Cliche in Treatment: 6 HBO Safety Checklist Items Safety Checklist Consent Form Signed Patient voided / foley secured and emptied When did you last eato am Last dose of injectable or oral agent am Ostomy pouch emptied and vented if applicable NA All implantable devices assessed, documented and approved NA Intravenous access site secured and place NA Valuables secured Linens and cotton and cotton/polyester blend (less than 51% polyester) Personal oil-based products / skin lotions / body lotions removed Wigs or hairpieces removed NA Smoking or tobacco materials removed NA Books / newspapers / magazines / loose paper removed NA Cologne, aftershave, perfume and deodorant removed Jewelry removed (may wrap wedding band) Make-up removed NA Hair care products removed Battery operated devices (external) removed NA Heating patches and chemical warmers removed NA Titanium eyewear removed NA Nail polish cured greater than 10 hours NA Casting material cured greater than 10 hours NA Hearing aids removed NA Loose dentures or partials removed NA Prosthetics have been removed NA Patient demonstrates correct use of air break device (if applicable) Patient concerns have been addressed Patient grounding bracelet on and cord attached to chamber Specifics for Inpatients (complete in addition  to above) Medication sheet sent with patient Intravenous medications needed or due during therapy sent with patient Drainage tubes (e.g. nasogastric tube or chest tube secured and vented) Endotracheal or Tracheotomy tube secured Cuff deflated of air and inflated with saline Airway suctioned Electronic  Signature(s) Signed: 08/12/2021 1:07:42 PM By: Enedina Finner RCP, RRT, CHT Entered By: Enedina Finner on 08/12/2021 08:22:14

## 2021-08-13 NOTE — Progress Notes (Signed)
SHIN, [name] ([id number]) Visit Report for 08/13/2021 Arrival Information Details Patient Name: Dennis Hicks, Dennis Hicks Date of Service: 08/13/2021 8:00 AM Medical Record Number: [id number] Patient Account Number: [id number] Date of Birth/Sex: Apr 17, 1955 (65 y.o. M) Treating RN: Cornell Barman Primary Care Javari Bufkin: Eliezer Lofts Other Clinician: Jacqulyn Bath Referring Mayu Ronk: Eliezer Lofts Treating Hatley Henegar/Extender: Skipper Cliche in Treatment: 7 Visit Information History Since Last Visit Added or deleted any medications: No Patient Arrived: Ambulatory Any new allergies or adverse reactions: No Arrival Time: 07:55 Had a fall or experienced change in No Accompanied By: self activities of daily living that may affect Transfer Assistance: None risk of falls: Patient Identification Verified: Yes Signs or symptoms of abuse/neglect since last visito No Secondary Verification Process Completed: Yes Hospitalized since last visit: No Patient Requires Transmission-Based Precautions: No Implantable device outside of the clinic excluding No Patient Has Alerts: No cellular tissue based products placed in the center since last visit: Pain Present Now: No Electronic Signature(s) Signed: 08/13/2021 1:41:37 PM By: Enedina Finner RCP, RRT, CHT Entered By: Enedina Finner on 08/13/2021 08:48:30 Dennis Hicks ([id number]) -------------------------------------------------------------------------------- Encounter Discharge Information Details Patient Name: Dennis Hicks Date of Service: 08/13/2021 8:00 AM Medical Record Number: [id number] Patient Account Number: [id number] Date of Birth/Sex: 06-23-1955 (65 y.o. M) Treating RN: Cornell Barman Primary Care [place] Delauder: Eliezer Lofts Other Clinician: Jacqulyn Bath Referring Cintya Daughety: Eliezer Lofts Treating Jasneet Schobert/Extender: Skipper Cliche in Treatment: 7 Encounter Discharge Information Items Discharge Condition: Stable Ambulatory  Status: Ambulatory Discharge Destination: Home Transportation: Private Auto Accompanied By: self Schedule Follow-up Appointment: Yes Clinical Summary of Care: Notes Patient has an HBO treatment scheduled on 08/17/21 at 08:00 am. Electronic Signature(s) Signed: 08/13/2021 1:41:37 PM By: Enedina Finner RCP, RRT, CHT Entered By: Enedina Finner on 08/13/2021 13:06:21 Dennis Hicks ([id number]) -------------------------------------------------------------------------------- Vitals Details Patient Name: Dennis Hicks Date of Service: 08/13/2021 8:00 AM Medical Record Number: [id number] Patient Account Number: [id number] Date of Birth/Sex: 12-29-1955 (65 y.o. M) Treating RN: Cornell Barman Primary Care Janiyah Beery: Eliezer Lofts Other Clinician: Jacqulyn Bath Referring Tynisa Vohs: Eliezer Lofts Treating Kylyn Sookram/Extender: Skipper Cliche in Treatment: 7 Vital Signs Time Taken: 08:07 Temperature (F): 98.7 Height (in): 68 Pulse (bpm): 72 Weight (lbs): 185 Respiratory Rate (breaths/min): 16 Body Mass Index (BMI): 28.1 Blood Pressure (mmHg): 105/62 Capillary Blood Glucose (mg/dl): 182 Reference Range: 80 - 120 mg / dl Electronic Signature(s) Signed: 08/13/2021 1:41:37 PM By: Enedina Finner RCP, RRT, CHT Entered By: Enedina Finner on 08/13/2021 08:49:32

## 2021-08-17 ENCOUNTER — Encounter: Payer: 59 | Admitting: Physician Assistant

## 2021-08-17 DIAGNOSIS — N3041 Irradiation cystitis with hematuria: Secondary | ICD-10-CM | POA: Diagnosis not present

## 2021-08-17 LAB — GLUCOSE, CAPILLARY
Glucose-Capillary: 178 mg/dL — ABNORMAL HIGH (ref 70–99)
Glucose-Capillary: 120 mg/dL — ABNORMAL HIGH (ref 70–99)

## 2021-08-17 NOTE — Progress Notes (Signed)
Dennis Hicks, Dennis Hicks ([id number]) Visit Report for 08/17/2021 Arrival Information Details Patient Name: Dennis Hicks, Dennis Hicks Date of Service: 08/17/2021 8:00 AM Medical Record Number: [id number] Patient Account Number: [id number] Date of Birth/Sex: 20-Jan-1956 (65 y.o. M) Treating RN: Cornell Barman Primary Care Shawanna Zanders: Eliezer Lofts Other Clinician: Jacqulyn Bath Referring Ilyaas Musto: Eliezer Lofts Treating Corneshia Hines/Extender: Skipper Cliche in Treatment: 7 Visit Information History Since Last Visit Added or deleted any medications: No Patient Arrived: Ambulatory Any new allergies or adverse reactions: No Arrival Time: 07:50 Had a fall or experienced change in No Accompanied By: self activities of daily living that may affect Transfer Assistance: None risk of falls: Patient Identification Verified: Yes Signs or symptoms of abuse/neglect since last visito No Secondary Verification Process Completed: Yes Hospitalized since last visit: No Patient Requires Transmission-Based Precautions: No Pain Present Now: No Patient Has Alerts: No Electronic Signature(s) Signed: 08/17/2021 11:11:17 AM By: Enedina Finner RCP, RRT, CHT Entered By: Enedina Finner on 08/17/2021 09:29:41 Dennis Hicks ([id number]) -------------------------------------------------------------------------------- Encounter Discharge Information Details Patient Name: Dennis Hicks Date of Service: 08/17/2021 8:00 AM Medical Record Number: [id number] Patient Account Number: [id number] Date of Birth/Sex: 08-12-1955 (65 y.o. M) Treating RN: Cornell Barman Primary Care Zakariyah Freimark: Eliezer Lofts Other Clinician: Jacqulyn Bath Referring Allard Lightsey: Eliezer Lofts Treating Kanani Mowbray/Extender: Skipper Cliche in Treatment: 7 Encounter Discharge Information Items Discharge Condition: Stable Ambulatory Status: Ambulatory Discharge Destination: Home Transportation: Private Auto Accompanied By: self Schedule Follow-up  Appointment: Yes Clinical Summary of Care: Notes Patient has an HBO treatment scheduled on 08/18/21 at 08:00 am. Electronic Signature(s) Signed: 08/17/2021 11:11:17 AM By: Enedina Finner RCP, RRT, CHT Entered By: Enedina Finner on 08/17/2021 11:10:22 Dennis Hicks ([id number]) -------------------------------------------------------------------------------- Vitals Details Patient Name: Dennis Hicks Date of Service: 08/17/2021 8:00 AM Medical Record Number: [id number] Patient Account Number: [id number] Date of Birth/Sex: [birth year] (65 y.o. M) Treating RN: Cornell Barman Primary Care Mariel Gaudin: Eliezer Lofts Other Clinician: Jacqulyn Bath Referring Nikitas Davtyan: Eliezer Lofts Treating Selah Zelman/Extender: Skipper Cliche in Treatment: 7 Vital Signs Time Taken: 08:02 Temperature (F): 98.6 Height (in): 68 Pulse (bpm): 66 Weight (lbs): 185 Respiratory Rate (breaths/min): 16 Body Mass Index (BMI): 28.1 Blood Pressure (mmHg): 106/60 Capillary Blood Glucose (mg/dl): 178 Reference Range: 80 - 120 mg / dl Electronic Signature(s) Signed: 08/17/2021 11:11:17 AM By: Enedina Finner RCP, RRT, CHT Entered By: Enedina Finner on 08/17/2021 09:30:31

## 2021-08-17 NOTE — Progress Notes (Addendum)
MATTIA, LIFORD ([id number]) Visit Report for 08/17/2021 HBO Details Patient Name: Dennis Hicks, Dennis Hicks Date of Service: 08/17/2021 8:00 AM Medical Record Number: [id number] Patient Account Number: [id number] Date of Birth/Sex: 1955-04-29 (66 y.o. 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 HBO Treatment Details Treatment Number: 31 Patient Type: Outpatient Chamber Type: Monoplace Chamber Serial #: [id number] Treatment Protocol: 2.5 ATA with 90 minutes oxygen, with two 5 minute air breaks Treatment Details Compression Rate Down: 1.5 psi / minute De-Compression Rate Up: 2.0 psi / minute Compress Tx Pressure Air breaks and breathing periods Decompress Decompress Begins Reached (leave unused spaces blank) Begins Ends Chamber Pressure (ATA) 1 2.5 2.5 2.5 2.5 2.5 - - 2.5 1 Clock Time (24 hr) 08:15 08:30 09:00 09:05 09:36 09:41 - - 10:11 10:23 Treatment Length: 128 (minutes) Treatment Segments: 4 Vital Signs Capillary Blood Glucose Reference Range: 80 - 120 mg / dl HBO Diabetic Blood Glucose Intervention Range: <131 mg/dl or >249 mg/dl Time Vitals Blood Respiratory Capillary Blood Glucose Pulse Action Type: Pulse: Temperature: Taken: Pressure: Rate: Glucose (mg/dl): Meter #: Oximetry (%) Taken: Pre 08:02 106/60 66 16 98.6 178 1 none per protocol Post 10:32 112/62 60 16 97.9 120 1 none per protocol Treatment Response Treatment Toleration: Well Treatment Completion Treatment Completed without Adverse Event Status: HBO Attestation I certify that I supervised this HBO treatment in accordance with Medicare guidelines. A trained emergency  response team is readily Yes available per hospital policies and procedures. Continue HBOT as ordered. Yes Electronic Signature(s) Signed: 08/17/2021 5:17:23 PM By: Worthy Keeler PA-C Previous Signature: 08/17/2021 11:11:17 AM Version By: Enedina Finner RCP, RRT, CHT Previous Signature: 08/17/2021 12:09:48 PM Version By: Worthy Keeler PA-C Entered By: Worthy Keeler on 08/17/2021 17:17:23 SAVA, PROBY ([id number]) -------------------------------------------------------------------------------- HBO Safety Checklist Details Patient Name: [name] Date of Service: 08/17/2021 8:00 AM Medical Record Number: [id number] Patient Account Number: [id number] Date of Birth/Sex: 1955/11/24 (66 y.o. M) Treating RN: Cornell Barman Primary Care Keigan Girten: Eliezer Lofts Other Clinician: Jacqulyn Bath Referring Hardy Harcum: Eliezer Lofts Treating Girolamo Lortie/Extender: Skipper Cliche in Treatment: 7 HBO Safety Checklist Items Safety Checklist Consent Form Signed Patient voided / foley secured and emptied When did you last eato am Last dose of injectable or oral agent am Ostomy pouch emptied and vented if applicable NA All implantable devices assessed, documented and approved NA Intravenous access site secured and place NA Valuables secured Linens and cotton and cotton/polyester blend (less than 51% polyester) Personal oil-based products / skin lotions / body lotions removed Wigs or hairpieces removed NA Smoking or tobacco materials removed NA Books / newspapers / magazines / loose paper removed NA Cologne, aftershave, perfume and deodorant removed Jewelry removed (may wrap wedding band) Make-up removed NA Hair care products removed Battery operated devices (external) removed NA Heating patches and chemical warmers removed NA Titanium eyewear removed NA Nail polish cured greater than 10 hours NA Casting material cured greater than 10 hours NA Hearing aids  removed NA Loose dentures or partials removed NA Prosthetics have been removed NA Patient demonstrates correct use of air break device (if applicable) Patient concerns have been addressed Patient grounding bracelet on and cord attached to chamber Specifics for Inpatients (complete in addition  to above) Medication sheet sent with patient Intravenous medications needed or due during therapy sent with patient Drainage tubes (e.g. nasogastric tube or chest tube secured and vented) Endotracheal or Tracheotomy tube secured Cuff deflated of air and inflated with saline Airway suctioned Electronic Signature(s) Signed: 08/17/2021 11:11:17 AM By: Enedina Finner RCP, RRT, CHT Entered By: Enedina Finner on 08/17/2021 09:32:00

## 2021-08-17 NOTE — Progress Notes (Signed)
ESCHER, HARR ([id number]) Visit Report for 08/17/2021 Problem List Details Patient Name: [name], [name] Date of Service: 08/17/2021 8:00 AM Medical Record Number: [id number] Patient Account Number: [id number] Date of Birth/Sex: 05-Apr-1955 (65 y.o. 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 06/25/2021 No Yes N39.3 Stress incontinence (male) (male) 06/25/2021 No Yes Z85.46 Personal history of malignant neoplasm of prostate 06/25/2021 No Yes E11.69 Type 2 diabetes mellitus with other specified complication 04/24/8248 No Yes Inactive Problems Resolved Problems Electronic Signature(s) Signed: 08/17/2021 5:17:17 PM By: Worthy Keeler PA-C Entered By: Worthy Keeler on 08/17/2021 17:17:16 Dennis Hicks ([id number]) -------------------------------------------------------------------------------- SuperBill Details Patient Name: Dennis Hicks Date of Service: 08/17/2021 Medical Record Number: [id number] Patient Account Number: [id number] Date of Birth/Sex: [birth year] (65 y.o. 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 N39.3 Stress incontinence (male) (male) Z62.46 Personal history of malignant neoplasm of prostate E11.69 Type 2 diabetes mellitus with other specified complication Facility Procedures CPT4 Code: 82800349 Description: (Facility Use Only) HBOT, full body chamber, 12mn Modifier: Quantity: 4 Physician Procedures CPT4 Code: 61791505Description: [phone] PHYS HYPERBARIC  OXYGEN THERAPY Modifier: Quantity: 1 CPT4 Code: Description: ICD-10 Diagnosis Description N30.41 Irradiation cystitis with hematuria Z85.46 Personal history of malignant neoplasm of prostate E11.69 Type 2 diabetes mellitus with other specified complication Modifier: Quantity: Electronic Signature(s) Signed: 08/17/2021 5:17:27 PM By: SWorthy KeelerPA-C Previous Signature: 08/17/2021 11:11:17 AM Version By: WEnedina FinnerRCP, RRT, CHT Previous Signature: 08/17/2021 12:09:48 PM Version By: SWorthy KeelerPA-C Entered By: SWorthy Keeleron 08/17/2021 17:17:27

## 2021-08-18 ENCOUNTER — Encounter (HOSPITAL_BASED_OUTPATIENT_CLINIC_OR_DEPARTMENT_OTHER): Payer: 59 | Admitting: Internal Medicine

## 2021-08-18 DIAGNOSIS — Z8546 Personal history of malignant neoplasm of prostate: Secondary | ICD-10-CM | POA: Diagnosis not present

## 2021-08-18 DIAGNOSIS — N3041 Irradiation cystitis with hematuria: Secondary | ICD-10-CM | POA: Diagnosis not present

## 2021-08-18 DIAGNOSIS — E1169 Type 2 diabetes mellitus with other specified complication: Secondary | ICD-10-CM | POA: Diagnosis not present

## 2021-08-18 LAB — GLUCOSE, CAPILLARY
Glucose-Capillary: 175 mg/dL — ABNORMAL HIGH (ref 70–99)
Glucose-Capillary: 112 mg/dL — ABNORMAL HIGH (ref 70–99)

## 2021-08-18 NOTE — Progress Notes (Signed)
COLA, HIGHFILL ([id number]) Visit Report for 08/18/2021 Arrival Information Details Patient Name: Dennis Hicks, Dennis Hicks Date of Service: 08/18/2021 8:00 AM Medical Record Number: [id number] Patient Account Number: [id number] Date of Birth/Sex: 03-Apr-1955 (66 y.o. M) Treating RN: Cornell Barman Primary Care Mikalia Fessel: Eliezer Lofts Other Clinician: Jacqulyn Bath Referring Daymien Goth: Eliezer Lofts Treating Steffan Caniglia/Extender: Yaakov Guthrie in Treatment: 7 Visit Information History Since Last Visit Added or deleted any medications: No Patient Arrived: Ambulatory Any new allergies or adverse reactions: No Arrival Time: 08:32 Had a fall or experienced change in No Accompanied By: self activities of daily living that may affect Transfer Assistance: None risk of falls: Patient Identification Verified: Yes Signs or symptoms of abuse/neglect since last visito No Secondary Verification Process Completed: Yes Hospitalized since last visit: No Patient Requires Transmission-Based Precautions: No Implantable device outside of the clinic excluding No Patient Has Alerts: No cellular tissue based products placed in the center since last visit: Pain Present Now: No Electronic Signature(s) Signed: 08/18/2021 1:08:04 PM By: Enedina Finner RCP, RRT, CHT Entered By: Enedina Finner on 08/18/2021 08:32:33 Dennis Hicks ([id number]) -------------------------------------------------------------------------------- Encounter Discharge Information Details Patient Name: Dennis Hicks Date of Service: 08/18/2021 8:00 AM Medical Record Number: [id number] Patient Account Number: [id number] Date of Birth/Sex: January 03, 1956 (66 y.o. M) Treating RN: Cornell Barman Primary Care Karee Christopherson: Eliezer Lofts Other Clinician: Jacqulyn Bath Referring Cadance Raus: Eliezer Lofts Treating Margaret Staggs/Extender: Yaakov Guthrie in Treatment: 7 Encounter Discharge Information Items Discharge Condition:  Stable Ambulatory Status: Ambulatory Discharge Destination: Home Transportation: Private Auto Accompanied By: self Schedule Follow-up Appointment: Yes Clinical Summary of Care: Notes Patient has an HBO treatment scheduled on 08/19/21 at 08:00 am. Electronic Signature(s) Signed: 08/18/2021 1:08:04 PM By: Enedina Finner RCP, RRT, CHT Entered By: Enedina Finner on 08/18/2021 11:02:55 Dennis Hicks ([id number]) -------------------------------------------------------------------------------- Vitals Details Patient Name: Dennis Hicks Date of Service: 08/18/2021 8:00 AM Medical Record Number: [id number] Patient Account Number: [id number] Date of Birth/Sex: 1956-03-14 (66 y.o. M) Treating RN: Cornell Barman Primary Care Broden Holt: Eliezer Lofts Other Clinician: Jacqulyn Bath Referring Ceira Hoeschen: Eliezer Lofts Treating Nitzia Perren/Extender: Yaakov Guthrie in Treatment: 7 Vital Signs Time Taken: 08:03 Temperature (F): 98.7 Height (in): 68 Pulse (bpm): 72 Weight (lbs): 185 Respiratory Rate (breaths/min): 16 Body Mass Index (BMI): 28.1 Blood Pressure (mmHg): 112/62 Capillary Blood Glucose (mg/dl): 175 Reference Range: 80 - 120 mg / dl Electronic Signature(s) Signed: 08/18/2021 1:08:04 PM By: Enedina Finner RCP, RRT, CHT Entered By: Enedina Finner on 08/18/2021 08:33:22

## 2021-08-18 NOTE — Progress Notes (Signed)
TOBENNA, NEEDS ([id number]) Visit Report for 08/18/2021 HBO Details Patient Name: Dennis Hicks, Dennis Hicks Date of Service: 08/18/2021 8:00 AM Medical Record Number: [id number] Patient Account Number: [id number] Date of Birth/Sex: 1955-06-26 (66 y.o. 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 HBO Treatment Details Treatment Number: 32 Patient Type: Outpatient Chamber Type: Monoplace Chamber Serial #: [id number] Treatment Protocol: 2.5 ATA with 90 minutes oxygen, with two 5 minute air breaks Treatment Details Compression Rate Down: 1.5 psi / minute De-Compression Rate Up: 2.0 psi / minute Compress Tx Pressure Air breaks and breathing periods Decompress Decompress Begins Reached (leave unused spaces blank) Begins Ends Chamber Pressure (ATA) 1 2.5 2.5 2.5 2.5 2.5 - - 2.5 1 Clock Time (24 hr) 08:29 08:44 09:14 09:19 09:50 09:55 - - 10:25 10:36 Treatment Length: 127 (minutes) Treatment Segments: 4 Vital Signs Capillary Blood Glucose Reference Range: 80 - 120 mg / dl HBO Diabetic Blood Glucose Intervention Range: <131 mg/dl or >249 mg/dl Time Vitals Blood Respiratory Capillary Blood Glucose Pulse Action Type: Pulse: Temperature: Taken: Pressure: Rate: Glucose (mg/dl): Meter #: Oximetry (%) Taken: Pre 08:03 112/62 72 16 98.7 175 1 none per protocol Post 10:46 110/68 72 16 97.8 112 1 none per protocol Treatment Response Treatment Toleration: Well Treatment Completion Treatment Completed without Adverse Event Status: HBO Attestation I certify that I supervised this HBO treatment in accordance with Medicare guidelines. A trained emergency  response team is readily Yes available per hospital policies and procedures. Continue HBOT as ordered. Yes Electronic Signature(s) Signed: 08/18/2021 1:03:26 PM By: Kalman Shan DO Entered By: Kalman Shan on 08/18/2021 13:02:24 Dennis Hicks ([id number]) -------------------------------------------------------------------------------- HBO Safety Checklist Details Patient Name: Dennis Hicks Date of Service: 08/18/2021 8:00 AM Medical Record Number: [id number] Patient Account Number: [id number] Date of Birth/Sex: 31-May-1955 (66 y.o. M) Treating RN: Cornell Barman Primary Care Cheridan Kibler: Eliezer Lofts Other Clinician: Jacqulyn Bath Referring Dajion Bickford: Eliezer Lofts Treating Duha Abair/Extender: Yaakov Guthrie in Treatment: 7 HBO Safety Checklist Items Safety Checklist Consent Form Signed Patient voided / foley secured and emptied When did you last eato am Last dose of injectable or oral agent am Ostomy pouch emptied and vented if applicable NA All implantable devices assessed, documented and approved NA Intravenous access site secured and place NA Valuables secured Linens and cotton and cotton/polyester blend (less than 51% polyester) Personal oil-based products / skin lotions / body lotions removed Wigs or hairpieces removed NA Smoking or tobacco materials removed NA Books / newspapers / magazines / loose paper removed NA Cologne, aftershave, perfume and deodorant removed Jewelry removed (may wrap wedding band) Make-up removed NA Hair care products removed Battery operated devices (external) removed NA Heating patches and chemical warmers removed NA Titanium eyewear removed NA Nail polish cured greater than 10 hours NA Casting material cured greater than 10 hours NA Hearing aids removed NA Loose dentures or partials removed NA Prosthetics have been removed NA Patient demonstrates correct use of air break device (if applicable) Patient concerns have  been addressed Patient grounding bracelet on and cord attached to chamber Specifics for Inpatients (complete in addition to above) Medication sheet sent with patient Intravenous medications needed or due during therapy sent with patient Drainage tubes (e.g. nasogastric tube or chest tube secured  and vented) Endotracheal or Tracheotomy tube secured Cuff deflated of air and inflated with saline Airway suctioned Electronic Signature(s) Signed: 08/18/2021 1:08:04 PM By: Enedina Finner RCP, RRT, CHT Entered By: Enedina Finner on 08/18/2021 08:34:45

## 2021-08-19 ENCOUNTER — Encounter: Payer: 59 | Attending: Physician Assistant | Admitting: Physician Assistant

## 2021-08-19 DIAGNOSIS — Z8546 Personal history of malignant neoplasm of prostate: Secondary | ICD-10-CM | POA: Diagnosis not present

## 2021-08-19 DIAGNOSIS — N393 Stress incontinence (female) (male): Secondary | ICD-10-CM | POA: Diagnosis not present

## 2021-08-19 DIAGNOSIS — E1169 Type 2 diabetes mellitus with other specified complication: Secondary | ICD-10-CM | POA: Diagnosis not present

## 2021-08-19 DIAGNOSIS — N3041 Irradiation cystitis with hematuria: Secondary | ICD-10-CM | POA: Diagnosis not present

## 2021-08-19 LAB — GLUCOSE, CAPILLARY
Glucose-Capillary: 182 mg/dL — ABNORMAL HIGH (ref 70–99)
Glucose-Capillary: 113 mg/dL — ABNORMAL HIGH (ref 70–99)

## 2021-08-19 NOTE — Progress Notes (Signed)
KEILEN, KAHL ([id number]) Visit Report for 08/19/2021 Problem List Details Patient Name: Dennis Hicks, Dennis Hicks Date of Service: 08/19/2021 8:00 AM Medical Record Number: [id number] Patient Account Number: [id number] Date of Birth/Sex: 09/07/1955 (65 y.o. 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 06/25/2021 No Yes N39.3 Stress incontinence (male) (male) 06/25/2021 No Yes Z85.46 Personal history of malignant neoplasm of prostate 06/25/2021 No Yes E11.69 Type 2 diabetes mellitus with other specified complication 05/23/2874 No Yes Inactive Problems Resolved Problems Electronic Signature(s) Signed: 08/19/2021 4:24:02 PM By: Worthy Keeler PA-C Entered By: Worthy Keeler on 08/19/2021 16:24:02 Dennis Hicks ([id number]) -------------------------------------------------------------------------------- SuperBill Details Patient Name: Dennis Hicks Date of Service: 08/19/2021 Medical Record Number: [id number] Patient Account Number: [id number] Date of Birth/Sex: 03-Jan-1956 (65 y.o. 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 N39.3 Stress incontinence (male) (male) Z53.46 Personal history of malignant neoplasm of prostate E11.69 Type 2 diabetes mellitus with other specified complication Facility Procedures CPT4 Code: 84536468 Description: (Facility Use Only) HBOT, full body chamber, 70mn Modifier: Quantity: 4 Physician Procedures CPT4 Code: 60321224Description: [phone] PHYS HYPERBARIC OXYGEN  THERAPY Modifier: Quantity: 1 CPT4 Code: Description: ICD-10 Diagnosis Description N30.41 Irradiation cystitis with hematuria Z85.46 Personal history of malignant neoplasm of prostate E11.69 Type 2 diabetes mellitus with other specified complication Modifier: Quantity: Electronic Signature(s) Signed: 08/19/2021 4:24:13 PM By: SWorthy KeelerPA-C Previous Signature: 08/19/2021 10:59:39 AM Version By: WEnedina FinnerRCP, RRT, CHT Previous Signature: 08/19/2021 4:22:20 PM Version By: SWorthy KeelerPA-C Entered By: SWorthy Keeleron 08/19/2021 16:24:13

## 2021-08-19 NOTE — Progress Notes (Signed)
CREEDON, DANIELSKI ([id number]) Visit Report for 08/19/2021 Arrival Information Details Patient Name: Dennis, Hicks Date of Service: 08/19/2021 8:00 AM Medical Record Number: [id number] Patient Account Number: [id number] Date of Birth/Sex: Nov 02, 1955 (65 y.o. M) Treating RN: Cornell Barman Primary Care Edelyn Heidel: Eliezer Lofts Other Clinician: Jacqulyn Bath Referring Marquavious Nazar: Eliezer Lofts Treating Jheri Mitter/Extender: Skipper Cliche in Treatment: 7 Visit Information History Since Last Visit Added or deleted any medications: No Patient Arrived: Ambulatory Any new allergies or adverse reactions: No Arrival Time: 08:05 Had a fall or experienced change in No Accompanied By: self activities of daily living that may affect Transfer Assistance: None risk of falls: Patient Identification Verified: Yes Signs or symptoms of abuse/neglect since last visito No Secondary Verification Process Completed: Yes Hospitalized since last visit: No Patient Requires Transmission-Based Precautions: No Implantable device outside of the clinic excluding No Patient Has Alerts: No cellular tissue based products placed in the center since last visit: Pain Present Now: No Electronic Signature(s) Signed: 08/19/2021 10:59:39 AM By: Enedina Finner RCP, RRT, CHT Entered By: Enedina Finner on 08/19/2021 08:21:33 Dennis Hicks ([id number]) -------------------------------------------------------------------------------- Encounter Discharge Information Details Patient Name: Dennis Hicks Date of Service: 08/19/2021 8:00 AM Medical Record Number: [id number] Patient Account Number: [id number] Date of Birth/Sex: 1955-05-26 (65 y.o. M) Treating RN: Cornell Barman Primary Care Devanee Pomplun: Eliezer Lofts Other Clinician: Jacqulyn Bath Referring Jasmane Brockway: Eliezer Lofts Treating Bulah Lurie/Extender: Skipper Cliche in Treatment: 7 Encounter Discharge Information Items Discharge Condition: Stable Ambulatory  Status: Ambulatory Discharge Destination: Home Transportation: Private Auto Accompanied By: self Schedule Follow-up Appointment: Yes Clinical Summary of Care: Notes Patient has an HBO treatment scheduled on 08/20/21 at 08:00 am. Electronic Signature(s) Signed: 08/19/2021 10:59:39 AM By: Enedina Finner RCP, RRT, CHT Entered By: Enedina Finner on 08/19/2021 10:56:55 Dennis Hicks ([id number]) -------------------------------------------------------------------------------- Vitals Details Patient Name: Dennis Hicks Date of Service: 08/19/2021 8:00 AM Medical Record Number: [id number] Patient Account Number: [id number] Date of Birth/Sex: 1955/06/01 (65 y.o. M) Treating RN: Cornell Barman Primary Care Linda Biehn: Eliezer Lofts Other Clinician: Jacqulyn Bath Referring Joby Hershkowitz: Eliezer Lofts Treating Floetta Brickey/Extender: Skipper Cliche in Treatment: 7 Vital Signs Time Taken: 08:12 Temperature (F): 98.2 Height (in): 68 Pulse (bpm): 72 Weight (lbs): 185 Respiratory Rate (breaths/min): 16 Body Mass Index (BMI): 28.1 Blood Pressure (mmHg): 120/68 Capillary Blood Glucose (mg/dl): 182 Reference Range: 80 - 120 mg / dl Electronic Signature(s) Signed: 08/19/2021 10:59:39 AM By: Enedina Finner RCP, RRT, CHT Entered By: Stark Jock, Amado Nash on 08/19/2021 08:23:04

## 2021-08-19 NOTE — Progress Notes (Signed)
THOMPSON, MCKIM ([id number]) Visit Report for 08/19/2021 HBO Details Patient Name: Dennis Hicks, Dennis Hicks Date of Service: 08/19/2021 8:00 AM Medical Record Number: [id number] Patient Account Number: [id number] Date of Birth/Sex: 1955/06/21 (65 y.o. 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 HBO Treatment Details Treatment Number: 33 Patient Type: Outpatient Chamber Type: Monoplace Chamber Serial #: [id number] Treatment Protocol: 2.5 ATA with 90 minutes oxygen, with two 5 minute air breaks Treatment Details Compression Rate Down: 1.5 psi / minute De-Compression Rate Up: 2.0 psi / minute Compress Tx Pressure Air breaks and breathing periods Decompress Decompress Begins Reached (leave unused spaces blank) Begins Ends Chamber Pressure (ATA) 1 2.5 2.5 2.5 2.5 2.5 - - 2.5 1 Clock Time (24 hr) 08:17 08:32 09:02 09:07 09:37 09:42 - - 10:12 10:24 Treatment Length: 127 (minutes) Treatment Segments: 4 Vital Signs Capillary Blood Glucose Reference Range: 80 - 120 mg / dl HBO Diabetic Blood Glucose Intervention Range: <131 mg/dl or >249 mg/dl Time Vitals Blood Respiratory Capillary Blood Glucose Pulse Action Type: Pulse: Temperature: Taken: Pressure: Rate: Glucose (mg/dl): Meter #: Oximetry (%) Taken: Pre 08:12 120/68 72 16 98.2 182 1 none per protocol Post 10:33 110/70 66 16 97.6 113 1 none per protocol Treatment Response Treatment Toleration: Well Treatment Completion Treatment Completed without Adverse Event Status: HBO Attestation I certify that I supervised this HBO treatment in accordance with Medicare guidelines. A trained emergency  response team is readily Yes available per hospital policies and procedures. Continue HBOT as ordered. Yes Electronic Signature(s) Signed: 08/19/2021 4:24:09 PM By: Worthy Keeler PA-C Previous Signature: 08/19/2021 10:59:39 AM Version By: Enedina Finner RCP, RRT, CHT Previous Signature: 08/19/2021 4:22:20 PM Version By: Worthy Keeler PA-C Entered By: Worthy Keeler on 08/19/2021 16:24:09 Dennis Hicks ([id number]) -------------------------------------------------------------------------------- HBO Safety Checklist Details Patient Name: Dennis Hicks Date of Service: 08/19/2021 8:00 AM Medical Record Number: [id number] Patient Account Number: [id number] Date of Birth/Sex: [birth year] (65 y.o. M) Treating RN: Cornell Barman Primary Care Cecylia Brazill: Eliezer Lofts Other Clinician: Jacqulyn Bath Referring Lovella Hardie: Eliezer Lofts Treating Laina Guerrieri/Extender: Skipper Cliche in Treatment: 7 HBO Safety Checklist Items Safety Checklist Consent Form Signed Patient voided / foley secured and emptied When did you last eato am Last dose of injectable or oral agent am Ostomy pouch emptied and vented if applicable NA All implantable devices assessed, documented and approved NA Intravenous access site secured and place NA Valuables secured Linens and cotton and cotton/polyester blend (less than 51% polyester) Personal oil-based products / skin lotions / body lotions removed Wigs or hairpieces removed NA Smoking or tobacco materials removed NA Books / newspapers / magazines / loose paper removed NA Cologne, aftershave, perfume and deodorant removed Jewelry removed (may wrap wedding band) Make-up removed NA Hair care products removed Battery operated devices (external) removed NA Heating patches and chemical warmers removed NA Titanium eyewear removed NA Nail polish cured greater than 10 hours NA Casting material cured greater than 10 hours NA Hearing aids removed NA Loose  dentures or partials removed NA Prosthetics have been removed NA Patient demonstrates correct use of air break device (if applicable) Patient concerns have been addressed Patient grounding bracelet on and cord attached to chamber Specifics for Inpatients (complete in addition  to above) Medication sheet sent with patient Intravenous medications needed or due during therapy sent with patient Drainage tubes (e.g. nasogastric tube or chest tube secured and vented) Endotracheal or Tracheotomy tube secured Cuff deflated of air and inflated with saline Airway suctioned Electronic Signature(s) Signed: 08/19/2021 10:59:39 AM By: Enedina Finner RCP, RRT, CHT Entered By: Enedina Finner on 08/19/2021 08:24:06

## 2021-08-20 ENCOUNTER — Encounter: Payer: 59 | Attending: Physician Assistant | Admitting: Physician Assistant

## 2021-08-20 DIAGNOSIS — Z8546 Personal history of malignant neoplasm of prostate: Secondary | ICD-10-CM | POA: Insufficient documentation

## 2021-08-20 DIAGNOSIS — N3041 Irradiation cystitis with hematuria: Secondary | ICD-10-CM | POA: Diagnosis present

## 2021-08-20 DIAGNOSIS — E1169 Type 2 diabetes mellitus with other specified complication: Secondary | ICD-10-CM | POA: Insufficient documentation

## 2021-08-20 DIAGNOSIS — N393 Stress incontinence (female) (male): Secondary | ICD-10-CM | POA: Diagnosis not present

## 2021-08-20 LAB — GLUCOSE, CAPILLARY
Glucose-Capillary: 178 mg/dL — ABNORMAL HIGH (ref 70–99)
Glucose-Capillary: 100 mg/dL — ABNORMAL HIGH (ref 70–99)

## 2021-08-20 NOTE — Progress Notes (Signed)
MASE, DHONDT ([id number]) Visit Report for 08/20/2021 Arrival Information Details Patient Name: Dennis Hicks Date of Service: 08/20/2021 8:00 AM Medical Record Number: [id number] Patient Account Number: [id number] Date of Birth/Sex: Feb 24, 1956 (65 y.o. M) Treating RN: Cornell Barman Primary Care Lacey Dotson: Eliezer Lofts Other Clinician: Jacqulyn Bath Referring Dex Blakely: Eliezer Lofts Treating Alfonso Carden/Extender: Skipper Cliche in Treatment: 8 Visit Information History Since Last Visit Added or deleted any medications: No Patient Arrived: Ambulatory Any new allergies or adverse reactions: No Arrival Time: 08:00 Had a fall or experienced change in No Accompanied By: self activities of daily living that may affect Transfer Assistance: None risk of falls: Patient Identification Verified: Yes Signs or symptoms of abuse/neglect since last visito No Secondary Verification Process Completed: Yes Hospitalized since last visit: No Patient Requires Transmission-Based Precautions: No Implantable device outside of the clinic excluding No Patient Has Alerts: No cellular tissue based products placed in the center since last visit: Has Dressing in Place as Prescribed: No Pain Present Now: No Electronic Signature(s) Signed: 08/20/2021 1:54:34 PM By: Enedina Finner RCP, RRT, CHT Entered By: Enedina Finner on 08/20/2021 08:12:35 Dennis Hicks ([id number]) -------------------------------------------------------------------------------- Encounter Discharge Information Details Patient Name: Dennis Hicks Date of Service: 08/20/2021 8:00 AM Medical Record Number: [id number] Patient Account Number: [id number] Date of Birth/Sex: 10-24-1955 (65 y.o. M) Treating RN: Cornell Barman Primary Care Tajia Szeliga: Eliezer Lofts Other Clinician: Jacqulyn Bath Referring Anjalina Bergevin: Eliezer Lofts Treating Doyt Castellana/Extender: Skipper Cliche in Treatment: 8 Encounter Discharge Information  Items Discharge Condition: Stable Ambulatory Status: Ambulatory Discharge Destination: Home Transportation: Private Auto Accompanied By: self Schedule Follow-up Appointment: No Clinical Summary of Care: Notes Patient has an HBO treatment scheduled on 08/23/21 at 08:00 am. Electronic Signature(s) Signed: 08/20/2021 1:54:34 PM By: Enedina Finner RCP, RRT, CHT Entered By: Enedina Finner on 08/20/2021 11:28:43 Dennis Hicks ([id number]) -------------------------------------------------------------------------------- Vitals Details Patient Name: Dennis Hicks Date of Service: 08/20/2021 8:00 AM Medical Record Number: [id number] Patient Account Number: [id number] Date of Birth/Sex: 09-Apr-1955 (65 y.o. M) Treating RN: Cornell Barman Primary Care Lynelle Weiler: Eliezer Lofts Other Clinician: Jacqulyn Bath Referring Rogelio Waynick: Eliezer Lofts Treating Nanako Stopher/Extender: Skipper Cliche in Treatment: 8 Vital Signs Time Taken: 08:05 Temperature (F): 98.5 Height (in): 68 Pulse (bpm): 62 Weight (lbs): 185 Respiratory Rate (breaths/min): 16 Body Mass Index (BMI): 28.1 Blood Pressure (mmHg): 110/60 Capillary Blood Glucose (mg/dl): 178 Reference Range: 80 - 120 mg / dl Electronic Signature(s) Signed: 08/20/2021 1:54:34 PM By: Enedina Finner RCP, RRT, CHT Entered By: Enedina Finner on 08/20/2021 08:13:15

## 2021-08-20 NOTE — Progress Notes (Signed)
BLONG, BUSK ([id number]) Visit Report for 08/20/2021 HBO Details Patient Name: Dennis Hicks, Dennis Hicks Date of Service: 08/20/2021 8:00 AM Medical Record Number: [id number] Patient Account Number: [id number] Date of Birth/Sex: 05/16/1955 (65 y.o. 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 HBO Treatment Details Treatment Number: 34 Patient Type: Outpatient Chamber Type: Monoplace Chamber Serial #: [id number] Treatment Protocol: 2.5 ATA with 90 minutes oxygen, with two 5 minute air breaks Treatment Details Compression Rate Down: 1.5 psi / minute De-Compression Rate Up: 2.0 psi / minute Compress Tx Pressure Air breaks and breathing periods Decompress Decompress Begins Reached (leave unused spaces blank) Begins Ends Chamber Pressure (ATA) 1 2.5 2.5 2.5 2.5 2.5 - - 2.5 1 Clock Time (24 hr) 08:10 08:25 08:55 09:01 09:31 09:36 - - 10:06 10:17 Treatment Length: 127 (minutes) Treatment Segments: 4 Vital Signs Capillary Blood Glucose Reference Range: 80 - 120 mg / dl HBO Diabetic Blood Glucose Intervention Range: <131 mg/dl or >249 mg/dl Time Vitals Blood Respiratory Capillary Blood Glucose Pulse Action Type: Pulse: Temperature: Taken: Pressure: Rate: Glucose (mg/dl): Meter #: Oximetry (%) Taken: Pre 08:05 110/60 62 16 98.5 178 1 none per protocol Post 10:27 112/62 60 16 97.7 100 1 none per protocol Treatment Response Treatment Toleration: Well Treatment Completion Treatment Completed without Adverse Event Status: HBO Attestation I certify that I supervised this HBO treatment in accordance with Medicare guidelines. A trained emergency  response team is readily Yes available per hospital policies and procedures. Continue HBOT as ordered. Yes Electronic Signature(s) Signed: 08/20/2021 4:35:23 PM By: Worthy Keeler PA-C Previous Signature: 08/20/2021 1:54:34 PM Version By: Enedina Finner RCP, RRT, CHT Entered By: Worthy Keeler on 08/20/2021 16:35:23 Dennis Hicks, Dennis Hicks ([id number]) -------------------------------------------------------------------------------- HBO Safety Checklist Details Patient Name: Dennis Hicks Date of Service: 08/20/2021 8:00 AM Medical Record Number: [id number] Patient Account Number: [id number] Date of Birth/Sex: 03/15/1956 (65 y.o. M) Treating RN: Cornell Barman Primary Care Kruti Horacek: Eliezer Lofts Other Clinician: Jacqulyn Bath Referring Montario Zilka: Eliezer Lofts Treating Malikah Principato/Extender: Skipper Cliche in Treatment: 8 HBO Safety Checklist Items Safety Checklist Consent Form Signed Patient voided / foley secured and emptied When did you last eato am Last dose of injectable or oral agent am Ostomy pouch emptied and vented if applicable NA All implantable devices assessed, documented and approved NA Intravenous access site secured and place NA Valuables secured Linens and cotton and cotton/polyester blend (less than 51% polyester) Personal oil-based products / skin lotions / body lotions removed Wigs or hairpieces removed NA Smoking or tobacco materials removed NA Books / newspapers / magazines / loose paper removed NA Cologne, aftershave, perfume and deodorant removed Jewelry removed (may wrap wedding band) Make-up removed NA Hair care products removed Battery operated devices (external) removed NA Heating patches and chemical warmers removed NA Titanium eyewear removed NA Nail polish cured greater than 10 hours NA Casting material cured greater than 10 hours NA Hearing aids removed NA Loose dentures or partials removed NA Prosthetics have been removed NA Patient  demonstrates correct use of air break device (if applicable) Patient concerns have been addressed Patient grounding bracelet on and cord attached to chamber Specifics for Inpatients (complete in addition to above) Medication sheet sent with patient Intravenous medications needed or  due during therapy sent with patient Drainage tubes (e.g. nasogastric tube or chest tube secured and vented) Endotracheal or Tracheotomy tube secured Cuff deflated of air and inflated with saline Airway suctioned Electronic Signature(s) Signed: 08/20/2021 1:54:34 PM By: Enedina Finner RCP, RRT, CHT Entered By: Enedina Finner on 08/20/2021 08:14:13

## 2021-08-20 NOTE — Progress Notes (Signed)
MAKS, CAVALLERO ([id number]) Visit Report for 08/20/2021 Problem List Details Patient Name: DANI, DANIS Date of Service: 08/20/2021 8:00 AM Medical Record Number: [id number] Patient Account Number: [id number] Date of Birth/Sex: Jul 27, 1955 (65 y.o. M) Treating RN: Cornell Barman Primary Care Provider: Eliezer Lofts Other Clinician: Jacqulyn Bath Referring Provider: Eliezer Lofts Treating Provider/Extender: Skipper Cliche in Treatment: 8 Active Problems ICD-10 Encounter Code Description Active Date MDM Diagnosis N30.41 Irradiation cystitis with hematuria 06/25/2021 No Yes N39.3 Stress incontinence (male) (male) 06/25/2021 No Yes Z85.46 Personal history of malignant neoplasm of prostate 06/25/2021 No Yes E11.69 Type 2 diabetes mellitus with other specified complication 10/24/9091 No Yes Inactive Problems Resolved Problems Electronic Signature(s) Signed: 08/20/2021 4:35:15 PM By: Worthy Keeler PA-C Entered By: Worthy Keeler on 08/20/2021 16:35:14 [name] ([id number]) -------------------------------------------------------------------------------- SuperBill Details Patient Name: [name] Date of Service: 08/20/2021 Medical Record Number: [id number] Patient Account Number: [id number] Date of Birth/Sex: 10/26/1955 (65 y.o. M) Treating RN: Cornell Barman Primary Care Provider: Eliezer Lofts Other Clinician: Jacqulyn Bath Referring Provider: Eliezer Lofts Treating Provider/Extender: Skipper Cliche in Treatment: 8 Diagnosis Coding ICD-10 Codes Code Description N30.41 Irradiation cystitis with hematuria N39.3 Stress incontinence (male) (male) Z73.46 Personal history of malignant neoplasm of prostate E11.69 Type 2 diabetes mellitus with other specified complication Facility Procedures CPT4 Code: 05183358 Description: (Facility Use Only) HBOT, full body chamber, 42mn Modifier: Quantity: 4 Physician Procedures CPT4 Code: 62518984Description: [id number] PHYS HYPERBARIC OXYGEN  THERAPY Modifier: Quantity: 1 CPT4 Code: Description: ICD-10 Diagnosis Description N30.41 Irradiation cystitis with hematuria Z85.46 Personal history of malignant neoplasm of prostate E11.69 Type 2 diabetes mellitus with other specified complication Modifier: Quantity: Electronic Signature(s) Signed: 08/20/2021 4:35:27 PM By: SWorthy KeelerPA-C Previous Signature: 08/20/2021 1:54:34 PM Version By: WEnedina FinnerRCP, RRT, CHT Entered By: SWorthy Keeleron 08/20/2021 16:35:27

## 2021-08-23 ENCOUNTER — Encounter: Payer: 59 | Admitting: Physician Assistant

## 2021-08-23 DIAGNOSIS — N3041 Irradiation cystitis with hematuria: Secondary | ICD-10-CM | POA: Diagnosis not present

## 2021-08-23 LAB — GLUCOSE, CAPILLARY
Glucose-Capillary: 160 mg/dL — ABNORMAL HIGH (ref 70–99)
Glucose-Capillary: 92 mg/dL (ref 70–99)

## 2021-08-23 NOTE — Progress Notes (Addendum)
JULIE, PAOLINI ([id number]) Visit Report for 08/23/2021 HBO Details Patient Name: Dennis Hicks, Dennis Hicks Date of Service: 08/23/2021 8:00 AM Medical Record Number: [id number] Patient Account Number: [id number] Date of Birth/Sex: [birth year] (66 y.o. 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 HBO Treatment Details Treatment Number: 35 Patient Type: Outpatient Chamber Type: Monoplace Chamber Serial #: [id number] Treatment Protocol: 2.5 ATA with 90 minutes oxygen, with two 5 minute air breaks Treatment Details Compression Rate Down: 1.5 psi / minute De-Compression Rate Up: 2.0 psi / minute Compress Tx Pressure Air breaks and breathing periods Decompress Decompress Begins Reached (leave unused spaces blank) Begins Ends Chamber Pressure (ATA) 1 2.5 2.5 2.5 2.5 2.5 - - 2.5 1 Clock Time (24 hr) 08:12 08:26 08:56 09:01 09:31 09:36 - - 10:06 10:17 Treatment Length: 125 (minutes) Treatment Segments: 4 Vital Signs Capillary Blood Glucose Reference Range: 80 - 120 mg / dl HBO Diabetic Blood Glucose Intervention Range: <131 mg/dl or >249 mg/dl Time Capillary Blood Glucose Pulse Blood Respiratory Action Type: Vitals Pulse: Temperature: Glucose Meter Oximetry Pressure: Rate: Taken: Taken: (mg/dl): #: (%) Pre 08:06 122/64 72 20 98.4 160 none per protocol Reported to Reyah Streeter, ok'd for Post 10:36 122/68 66 18 98 92 2 D/C Treatment Response Treatment Toleration: Well Treatment Completion Treatment Completed without Adverse Event Status: HBO Attestation I certify that I supervised this HBO treatment in accordance with Medicare guidelines. A  trained emergency response team is readily Yes available per hospital policies and procedures. Continue HBOT as ordered. Yes Electronic Signature(s) Signed: 08/23/2021 4:20:27 PM By: Worthy Keeler PA-C Previous Signature: 08/23/2021 10:56:38 AM Version By: Donavan Burnet CHT, EMT, BS Previous Signature: 08/23/2021 4:19:54 PM Version By: Worthy Keeler PA-C Previous Signature: 08/23/2021 9:26:43 AM Version By: Donavan Burnet CHT, EMT, BS Previous Signature: 08/23/2021 8:35:10 AM Version By: Donavan Burnet CHT, EMT, BS Entered By: Worthy Keeler on 08/23/2021 16:20:27 BRAYLAN, FAUL ([id number]) -------------------------------------------------------------------------------- HBO Safety Checklist Details Patient Name: Dennis Hicks Date of Service: 08/23/2021 8:00 AM Medical Record Number: [id number] Patient Account Number: [id number] Date of Birth/Sex: Dec 10, 1955 (66 y.o. M) Treating RN: Carlene Coria Primary Care Willodene Stallings: Eliezer Lofts Other Clinician: Donavan Burnet Referring Cherita Hebel: Eliezer Lofts Treating Ayrabella Labombard/Extender: Skipper Cliche in Treatment: 8 HBO Safety Checklist Items Safety Checklist Consent Form Signed Patient voided / foley secured and emptied When did you last eato am Last dose of injectable or oral agent am Ostomy pouch emptied and vented if applicable NA All implantable devices assessed, documented and approved Artificial Urinary Sphincter Intravenous access site secured and place NA Valuables secured Linens and cotton and cotton/polyester blend (less than 51% polyester) Personal oil-based products / skin lotions / body lotions removed Wigs or hairpieces removed NA Smoking or tobacco materials removed NA Books / newspapers / magazines / loose paper removed Cologne, aftershave, perfume and deodorant removed Jewelry removed (may wrap wedding band) Make-up removed Hair care products removed Battery operated devices (external) removed Heating  patches and chemical warmers removed Titanium eyewear removed Nail polish cured greater than 10 hours NA Casting material cured greater than 10 hours NA Hearing aids removed NA Loose dentures or partials removed NA Prosthetics have been removed NA Patient demonstrates correct use of air break device (  if applicable) Patient concerns have been addressed Patient grounding bracelet on and cord attached to chamber Specifics for Inpatients (complete in addition to above) NA Medication sheet sent with patient Intravenous medications needed or due during therapy sent with patient NA Drainage tubes (e.g. nasogastric tube or chest tube secured and vented) NA Endotracheal or Tracheotomy tube secured NA Cuff deflated of air and inflated with saline NA Airway suctioned NA Notes Paper version used prior to treatment. Electronic Signature(s) Signed: 08/23/2021 9:57:20 AM By: Donavan Burnet CHT, EMT, BS Previous Signature: 08/23/2021 9:26:54 AM Version By: Donavan Burnet CHT, EMT, BS Previous Signature: 08/23/2021 8:33:30 AM Version By: Donavan Burnet CHT, EMT, BS Entered By: Donavan Burnet on 08/23/2021 09:57:20

## 2021-08-23 NOTE — Progress Notes (Addendum)
DEONDRA, WIGGER ([id number]) Visit Report for 08/23/2021 Arrival Information Details Patient Name: Dennis Hicks, Dennis Hicks Date of Service: 08/23/2021 8:00 AM Medical Record Number: [id number] Patient Account Number: [id number] Date of Birth/Sex: June 28, 1955 (65 y.o. M) Treating RN: Carlene Coria Primary Care Tiffany Talarico: Eliezer Lofts Other Clinician: Donavan Burnet Referring Laure Leone: Eliezer Lofts Treating Seanna Sisler/Extender: Skipper Cliche in Treatment: 8 Visit Information History Since Last Visit All ordered tests and consults were completed: Yes Patient Arrived: Ambulatory Added or deleted any medications: No Arrival Time: 07:55 Any new allergies or adverse reactions: No Accompanied By: self Had a fall or experienced change in No Transfer Assistance: None activities of daily living that may affect Patient Identification Verified: Yes risk of falls: Secondary Verification Process Completed: Yes Signs or symptoms of abuse/neglect since last visito No Patient Requires Transmission-Based Precautions: No Hospitalized since last visit: No Patient Has Alerts: No Implantable device outside of the clinic excluding No cellular tissue based products placed in the center since last visit: Pain Present Now: No Electronic Signature(s) Signed: 08/23/2021 9:27:05 AM By: Donavan Burnet CHT, EMT, BS Previous Signature: 08/23/2021 8:21:52 AM Version By: Donavan Burnet CHT, EMT, BS Previous Signature: 08/23/2021 8:20:44 AM Version By: Donavan Burnet CHT, EMT, BS Entered By: Donavan Burnet on 08/23/2021 09:27:05 Dennis Hicks ([id number]) -------------------------------------------------------------------------------- Encounter Discharge Information Details Patient Name: Dennis Hicks Date of Service: 08/23/2021 8:00 AM Medical Record Number: [id number] Patient Account Number: [id number] Date of Birth/Sex: October 30, 1955 (65 y.o. M) Treating RN: Carlene Coria Primary Care Catcher Dehoyos: Eliezer Lofts Other  Clinician: Donavan Burnet Referring Chaquita Basques: Eliezer Lofts Treating Magie Ciampa/Extender: Skipper Cliche in Treatment: 8 Encounter Discharge Information Items Discharge Condition: Stable Ambulatory Status: Ambulatory Discharge Destination: Home Transportation: Private Auto Accompanied By: self Schedule Follow-up Appointment: No Clinical Summary of Care: Electronic Signature(s) Signed: 08/23/2021 10:57:33 AM By: Donavan Burnet CHT, EMT, BS Entered By: Donavan Burnet on 08/23/2021 10:57:33 Dennis Hicks ([id number]) -------------------------------------------------------------------------------- Vitals Details Patient Name: Dennis Hicks Date of Service: 08/23/2021 8:00 AM Medical Record Number: [id number] Patient Account Number: [id number] Date of Birth/Sex: 1955-04-06 (65 y.o. M) Treating RN: Carlene Coria Primary Care Dannielle Baskins: Eliezer Lofts Other Clinician: Donavan Burnet Referring Charnelle Bergeman: Eliezer Lofts Treating Brogan Martis/Extender: Skipper Cliche in Treatment: 8 Vital Signs Time Taken: 08:06 Temperature (F): 98.4 Height (in): 68 Pulse (bpm): 72 Weight (lbs): 185 Respiratory Rate (breaths/min): 20 Body Mass Index (BMI): 28.1 Blood Pressure (mmHg): 122/64 Capillary Blood Glucose (mg/dl): 160 Reference Range: 80 - 120 mg / dl Electronic Signature(s) Signed: 08/23/2021 9:27:19 AM By: Donavan Burnet CHT, EMT, BS Previous Signature: 08/23/2021 8:26:35 AM Version By: Donavan Burnet CHT, EMT, BS Entered By: Donavan Burnet on 08/23/2021 09:27:19

## 2021-08-23 NOTE — Progress Notes (Signed)
JAVED, COTTO ([id number]) Visit Report for 08/23/2021 Problem List Details Patient Name: Dennis Hicks, Dennis Hicks Date of Service: 08/23/2021 8:00 AM Medical Record Number: [id number] Patient Account Number: [id number] Date of Birth/Sex: [birth year] (65 y.o. M) Treating RN: Carlene Coria Primary Care Provider: Eliezer Lofts Other Clinician: Donavan Burnet Referring Provider: Eliezer Lofts Treating Provider/Extender: Skipper Cliche in Treatment: 8 Active Problems ICD-10 Encounter Code Description Active Date MDM Diagnosis N30.41 Irradiation cystitis with hematuria 06/25/2021 No Yes N39.3 Stress incontinence (male) (male) 06/25/2021 No Yes Z85.46 Personal history of malignant neoplasm of prostate 06/25/2021 No Yes E11.69 Type 2 diabetes mellitus with other specified complication 09/19/5364 No Yes Inactive Problems Resolved Problems Electronic Signature(s) Signed: 08/23/2021 4:20:20 PM By: Worthy Keeler PA-C Entered By: Worthy Keeler on 08/23/2021 16:20:19 Dennis Hicks ([id number]) -------------------------------------------------------------------------------- SuperBill Details Patient Name: Dennis Hicks Date of Service: 08/23/2021 Medical Record Number: [id number] Patient Account Number: [id number] Date of Birth/Sex: Jul 26, 1955 (65 y.o. M) Treating RN: Carlene Coria Primary Care Provider: Eliezer Lofts Other Clinician: Donavan Burnet Referring Provider: Eliezer Lofts Treating Provider/Extender: Skipper Cliche in Treatment: 8 Diagnosis Coding ICD-10 Codes Code Description N30.41 Irradiation cystitis with hematuria N39.3 Stress incontinence (male) (male) Z40.46 Personal history of malignant neoplasm of prostate E11.69 Type 2 diabetes mellitus with other specified complication Facility Procedures CPT4 Code: 33295188 Description: (Facility Use Only) HBOT, full body chamber, 25mn Modifier: Quantity: 4 CPT4 Code: Description: ICD-10 Diagnosis Description N30.41 Irradiation  cystitis with hematuria Z85.46 Personal history of malignant neoplasm of prostate E11.69 Type 2 diabetes mellitus with other specified complication Modifier: Quantity: Physician Procedures CPT4 Code: 64166063Description: 901601- WC PHYS HYPERBARIC OXYGEN THERAPY Modifier: Quantity: 1 CPT4 Code: Description: ICD-10 Diagnosis Description N30.41 Irradiation cystitis with hematuria Z85.46 Personal history of malignant neoplasm of prostate E11.69 Type 2 diabetes mellitus with other specified complication Modifier: Quantity: Electronic Signature(s) Signed: 08/23/2021 10:57:11 AM By: SDonavan BurnetCHT, EMT, BS Signed: 08/23/2021 4:19:54 PM By: SWorthy KeelerPA-C Entered By: SDonavan Burneton 08/23/2021 10:57:11

## 2021-08-24 ENCOUNTER — Encounter: Payer: 59 | Admitting: Physician Assistant

## 2021-08-24 DIAGNOSIS — N3041 Irradiation cystitis with hematuria: Secondary | ICD-10-CM | POA: Diagnosis not present

## 2021-08-24 LAB — GLUCOSE, CAPILLARY
Glucose-Capillary: 161 mg/dL — ABNORMAL HIGH (ref 70–99)
Glucose-Capillary: 102 mg/dL — ABNORMAL HIGH (ref 70–99)

## 2021-08-24 NOTE — Progress Notes (Signed)
RIOT, WATERWORTH ([id number]) Visit Report for 08/24/2021 HBO Safety Checklist Details Patient Name: BART, ASHFORD Date of Service: 08/24/2021 8:00 AM Medical Record Number: [id number] Patient Account Number: [id number] Date of Birth/Sex: 1955-04-30 (65 y.o. M) Treating RN: Carlene Coria Primary Care Quinlin Conant: Eliezer Lofts Other Clinician: Donavan Burnet Referring Kiran Lapine: Eliezer Lofts Treating Guiliana Shor/Extender: Skipper Cliche in Treatment: 8 HBO Safety Checklist Items Safety Checklist Consent Form Signed Patient voided / foley secured and emptied When did you last eato 0700 Last dose of injectable or oral agent 0700 Ostomy pouch emptied and vented if applicable NA All implantable devices assessed, documented and approved NA Intravenous access site secured and place NA Valuables secured Linens and cotton and cotton/polyester blend (less than 51% polyester) Personal oil-based products / skin lotions / body lotions removed Wigs or hairpieces removed NA Smoking or tobacco materials removed NA Books / newspapers / magazines / loose paper removed Cologne, aftershave, perfume and deodorant removed Jewelry removed (may wrap wedding band) Make-up removed NA Hair care products removed Battery operated devices (external) removed Heating patches and chemical warmers removed Titanium eyewear removed NA Nail polish cured greater than 10 hours NA Casting material cured greater than 10 hours NA Hearing aids removed NA Loose dentures or partials removed NA Prosthetics have been removed NA Patient demonstrates correct use of air break device (if applicable) Patient concerns have been addressed Patient grounding bracelet on and cord attached to chamber Specifics for Inpatients (complete in addition to above) NA Medication sheet sent with patient Intravenous medications needed or due during therapy sent with patient NA Drainage tubes (e.g. nasogastric tube or chest tube  secured and vented) NA Endotracheal or Tracheotomy tube secured NA Cuff deflated of air and inflated with saline NA Airway suctioned NA Notes Paper version used prior to treatment. Electronic Signature(s) Signed: 08/24/2021 9:09:49 AM By: Donavan Burnet CHT, EMT, BS Entered By: Donavan Burnet on 08/24/2021 09:09:49

## 2021-08-24 NOTE — Progress Notes (Addendum)
BOWYN, MERCIER ([id number]) Visit Report for 08/24/2021 Arrival Information Details Patient Name: Dennis Hicks, Dennis Hicks Date of Service: 08/24/2021 8:00 AM Medical Record Number: [id number] Patient Account Number: [id number] Date of Birth/Sex: [birth year] (65 y.o. M) Treating RN: Carlene Coria Primary Care Zuly Belkin: Eliezer Lofts Other Clinician: Donavan Burnet Referring Terell Kincy: Eliezer Lofts Treating Rahmel Nedved/Extender: Skipper Cliche in Treatment: 8 Visit Information History Since Last Visit All ordered tests and consults were completed: Yes Patient Arrived: Ambulatory Added or deleted any medications: No Arrival Time: 08:00 Any new allergies or adverse reactions: No Accompanied By: self Had a fall or experienced change in No Transfer Assistance: None activities of daily living that may affect Patient Identification Verified: Yes risk of falls: Secondary Verification Process Completed: Yes Signs or symptoms of abuse/neglect since last visito No Patient Requires Transmission-Based Precautions: No Hospitalized since last visit: No Patient Has Alerts: No Implantable device outside of the clinic excluding No cellular tissue based products placed in the center since last visit: Pain Present Now: No Electronic Signature(s) Signed: 08/24/2021 9:08:32 AM By: Donavan Burnet CHT, EMT, BS Entered By: Donavan Burnet on 08/24/2021 09:08:32 Dennis Hicks ([id number]) -------------------------------------------------------------------------------- Encounter Discharge Information Details Patient Name: Dennis Hicks Date of Service: 08/24/2021 8:00 AM Medical Record Number: [id number] Patient Account Number: [id number] Date of Birth/Sex: 1955/04/27 (65 y.o. M) Treating RN: Carlene Coria Primary Care Dareion Kneece: Eliezer Lofts Other Clinician: Donavan Burnet Referring Kaleel Schmieder: Eliezer Lofts Treating Audy Dauphine/Extender: Skipper Cliche in Treatment: 8 Encounter Discharge Information  Items Discharge Condition: Stable Ambulatory Status: Ambulatory Discharge Destination: Home Transportation: Private Auto Accompanied By: self Schedule Follow-up Appointment: No Clinical Summary of Care: Electronic Signature(s) Signed: 08/24/2021 11:44:26 AM By: Donavan Burnet CHT, EMT, BS Entered By: Donavan Burnet on 08/24/2021 11:44:26 Dennis Hicks ([id number]) -------------------------------------------------------------------------------- Vitals Details Patient Name: Dennis Hicks Date of Service: 08/24/2021 8:00 AM Medical Record Number: [id number] Patient Account Number: [id number] Date of Birth/Sex: May 22, 1955 (65 y.o. M) Treating RN: Carlene Coria Primary Care Danel Studzinski: Eliezer Lofts Other Clinician: Donavan Burnet Referring Deicy Rusk: Eliezer Lofts Treating Harlowe Dowler/Extender: Skipper Cliche in Treatment: 8 Vital Signs Time Taken: 08:04 Temperature (F): 98.7 Height (in): 68 Pulse (bpm): 78 Weight (lbs): 185 Respiratory Rate (breaths/min): 14 Body Mass Index (BMI): 28.1 Blood Pressure (mmHg): 118/62 Capillary Blood Glucose (mg/dl): 161 Reference Range: 80 - 120 mg / dl Electronic Signature(s) Signed: 08/24/2021 9:09:00 AM By: Donavan Burnet CHT, EMT, BS Entered By: Donavan Burnet on 08/24/2021 09:09:00

## 2021-08-25 ENCOUNTER — Encounter: Payer: 59 | Admitting: Physician Assistant

## 2021-08-25 DIAGNOSIS — N3041 Irradiation cystitis with hematuria: Secondary | ICD-10-CM | POA: Diagnosis not present

## 2021-08-25 LAB — GLUCOSE, CAPILLARY
Glucose-Capillary: 154 mg/dL — ABNORMAL HIGH (ref 70–99)
Glucose-Capillary: 106 mg/dL — ABNORMAL HIGH (ref 70–99)

## 2021-08-25 NOTE — Progress Notes (Addendum)
RIKU, BUTTERY ([id number]) Visit Report for 08/25/2021 HBO Details Patient Name: Dennis Hicks, Dennis Hicks Date of Service: 08/25/2021 8:00 AM Medical Record Number: [id number] Patient Account Number: [id number] Date of Birth/Sex: [birth year] (66 y.o. 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 HBO Treatment Details Treatment Number: 37 Patient Type: Outpatient Chamber Type: Monoplace Chamber Serial #: [id number] Treatment Protocol: 2.5 ATA with 90 minutes oxygen, with two 5 minute air breaks Treatment Details Compression Rate Down: 1.5 psi / minute De-Compression Rate Up: 2.0 psi / minute Compress Tx Pressure Air breaks and breathing periods Decompress Decompress Begins Reached (leave unused spaces blank) Begins Ends Chamber Pressure (ATA) 1 2.5 2.5 2.5 2.5 2.5 - - 2.5 1 Clock Time (24 hr) 08:26 08:39 09:09 09:14 09:44 09:49 - - 10:19 10:30 Treatment Length: 124 (minutes) Treatment Segments: 4 Vital Signs Capillary Blood Glucose Reference Range: 80 - 120 mg / dl HBO Diabetic Blood Glucose Intervention Range: <131 mg/dl or >249 mg/dl Time Vitals Blood Respiratory Capillary Blood Glucose Pulse Action Type: Pulse: Temperature: Taken: Pressure: Rate: Glucose (mg/dl): Meter #: Oximetry (%) Taken: Pre 08:21 108/62 66 16 97.9 154 1 none per protocol Post 10:35 122/68 72 16 98.4 106 1 none per protocol Treatment Response Treatment Toleration: Well Treatment Completion Treatment Completed without Adverse Event Status: Electronic Signature(s) Signed: 08/25/2021 11:35:36 AM By: Donavan Burnet CHT, EMT, BS Signed: 08/26/2021 4:11:43 PM By: Worthy Keeler PA-C Previous Signature: 08/25/2021 9:07:38 AM Version By: Donavan Burnet CHT, EMT, BS Entered By: Donavan Burnet on 08/25/2021 11:35:35 Dennis Hicks ([id number]) -------------------------------------------------------------------------------- HBO Safety Checklist Details Patient Name: Dennis Hicks Date of Service: 08/25/2021 8:00 AM Medical Record Number: [id number] Patient Account Number: [id number] Date of Birth/Sex: [birth year] (66 y.o. M) Treating RN: Carlene Coria Primary Care Milanya Sunderland: Eliezer Lofts Other Clinician: Donavan Burnet Referring Pennelope Basque: Eliezer Lofts Treating Calayah Guadarrama/Extender: Skipper Cliche in Treatment: 8 HBO Safety Checklist Items Safety Checklist Consent Form Signed Patient voided / foley secured and emptied When did you last eato 0645 Last dose of injectable or oral agent 0645 Ostomy pouch emptied and vented if applicable NA All implantable devices assessed, documented and approved NA Intravenous access site secured and place NA Valuables secured Linens and cotton and cotton/polyester blend (less than 51% polyester) Personal oil-based products / skin lotions / body lotions removed Wigs or hairpieces removed NA Smoking or tobacco materials removed NA Books / newspapers / magazines / loose paper removed Cologne, aftershave, perfume and deodorant removed Jewelry removed (may wrap wedding band) Make-up removed Hair care products removed NA Battery operated devices (external) removed Heating patches and chemical warmers removed Titanium eyewear removed NA Nail polish cured greater than 10 hours NA Casting material cured greater than 10 hours NA Hearing aids removed NA Loose dentures or partials removed NA Prosthetics have been removed NA Patient demonstrates correct use of air break device (if applicable) Patient concerns have been addressed Patient grounding bracelet on and cord attached to chamber Specifics for  Inpatients (complete in addition to above) NA Medication sheet sent with patient Intravenous medications needed or due during therapy sent with patient NA Drainage tubes (e.g. nasogastric tube or chest tube secured and vented) NA Endotracheal or Tracheotomy tube secured NA Cuff deflated of  air and inflated with saline NA Airway suctioned NA Notes Paper version used prior to treatment. Electronic Signature(s) Signed: 08/25/2021 9:06:52 AM By: Donavan Burnet CHT, EMT, BS Entered By: Donavan Burnet on 08/25/2021 09:06:51

## 2021-08-25 NOTE — Progress Notes (Signed)
CALHOUN, REICHARDT ([id number]) Visit Report for 08/24/2021 Problem List Details Patient Name: Dennis Hicks, Dennis Hicks Date of Service: 08/24/2021 8:00 AM Medical Record Number: [id number] Patient Account Number: [id number] Date of Birth/Sex: May 09, 1955 (65 y.o. M) Treating RN: Carlene Coria Primary Care Provider: Eliezer Lofts Other Clinician: Donavan Burnet Referring Provider: Eliezer Lofts Treating Provider/Extender: Skipper Cliche in Treatment: 8 Active Problems ICD-10 Encounter Code Description Active Date MDM Diagnosis N30.41 Irradiation cystitis with hematuria 06/25/2021 No Yes N39.3 Stress incontinence (male) (male) 06/25/2021 No Yes Z85.46 Personal history of malignant neoplasm of prostate 06/25/2021 No Yes E11.69 Type 2 diabetes mellitus with other specified complication 11/25/2227 No Yes Inactive Problems Resolved Problems Electronic Signature(s) Signed: 08/25/2021 8:32:33 AM By: Worthy Keeler PA-C Entered By: Worthy Keeler on 08/25/2021 08:32:33 Dennis Hicks ([id number]) -------------------------------------------------------------------------------- SuperBill Details Patient Name: Dennis Hicks Date of Service: 08/24/2021 Medical Record Number: [id number] Patient Account Number: [id number] Date of Birth/Sex: 03-30-1955 (65 y.o. M) Treating RN: Carlene Coria Primary Care Provider: Eliezer Lofts Other Clinician: Donavan Burnet Referring Provider: Eliezer Lofts Treating Provider/Extender: Skipper Cliche in Treatment: 8 Diagnosis Coding ICD-10 Codes Code Description N30.41 Irradiation cystitis with hematuria N39.3 Stress incontinence (male) (male) Z45.46 Personal history of malignant neoplasm of prostate E11.69 Type 2 diabetes mellitus with other specified complication Facility Procedures CPT4 Code: 18563149 Description: (Facility Use Only) HBOT, full body chamber, 78mn Modifier: Quantity: 4 CPT4 Code: Description: ICD-10 Diagnosis Description N30.41 Irradiation  cystitis with hematuria Z85.46 Personal history of malignant neoplasm of prostate E11.69 Type 2 diabetes mellitus with other specified complication Modifier: Quantity: Physician Procedures CPT4 Code: 67026378Description: 958850- WC PHYS HYPERBARIC OXYGEN THERAPY Modifier: Quantity: 1 CPT4 Code: Description: ICD-10 Diagnosis Description N30.41 Irradiation cystitis with hematuria Z85.46 Personal history of malignant neoplasm of prostate E11.69 Type 2 diabetes mellitus with other specified complication Modifier: Quantity: Electronic Signature(s) Unsigned Previous Signature: 08/24/2021 11:44:11 AM Version By: SDonavan BurnetCHT, EMT, BS Entered By: SWorthy Keeleron 08/25/2021 08:32:45 Signature(s): Date(s):

## 2021-08-25 NOTE — Progress Notes (Addendum)
Dennis Hicks, Dennis Hicks ([id number]) Visit Report for 08/25/2021 Arrival Information Details Patient Name: Dennis Hicks, Dennis Hicks Date of Service: 08/25/2021 8:00 AM Medical Record Number: [id number] Patient Account Number: [id number] Date of Birth/Sex: [birth year] (65 y.o. M) Treating RN: Carlene Coria Primary Care Brennin Durfee: Eliezer Lofts Other Clinician: Donavan Burnet Referring Shermar Friedland: Eliezer Lofts Treating Jeslyn Amsler/Extender: Skipper Cliche in Treatment: 8 Visit Information History Since Last Visit All ordered tests and consults were completed: Yes Patient Arrived: Ambulatory Added or deleted any medications: No Arrival Time: 07:56 Any new allergies or adverse reactions: No Accompanied By: self Had a fall or experienced change in No Transfer Assistance: None activities of daily living that may affect Patient Identification Verified: Yes risk of falls: Secondary Verification Process Completed: Yes Signs or symptoms of abuse/neglect since last visito No Patient Requires Transmission-Based Precautions: No Hospitalized since last visit: No Patient Has Alerts: No Implantable device outside of the clinic excluding No cellular tissue based products placed in the center since last visit: Pain Present Now: No Electronic Signature(s) Signed: 08/25/2021 8:53:20 AM By: Donavan Burnet CHT, EMT, BS Entered By: Donavan Burnet on 08/25/2021 08:53:19 Dennis Hicks ([id number]) -------------------------------------------------------------------------------- Encounter Discharge Information Details Patient Name: Dennis Hicks Date of Service: 08/25/2021 8:00 AM Medical Record Number: [id number] Patient Account Number: [id number] Date of Birth/Sex: [birth year] (65 y.o. M) Treating RN: Carlene Coria Primary Care Aayush Gelpi: Eliezer Lofts Other Clinician: Donavan Burnet Referring Kellis Mcadam: Eliezer Lofts Treating Lathan Gieselman/Extender: Skipper Cliche in Treatment: 8 Encounter Discharge Information  Items Discharge Condition: Stable Ambulatory Status: Ambulatory Discharge Destination: Home Transportation: Private Auto Accompanied By: self Schedule Follow-up Appointment: No Clinical Summary of Care: Electronic Signature(s) Signed: 08/25/2021 11:36:44 AM By: Donavan Burnet CHT, EMT, BS Entered By: Donavan Burnet on 08/25/2021 11:36:44 Dennis Hicks ([id number]) -------------------------------------------------------------------------------- Vitals Details Patient Name: Dennis Hicks Date of Service: 08/25/2021 8:00 AM Medical Record Number: [id number] Patient Account Number: [id number] Date of Birth/Sex: Apr 20, 1955 (65 y.o. M) Treating RN: Carlene Coria Primary Care Shivan Hodes: Eliezer Lofts Other Clinician: Donavan Burnet Referring Karthika Glasper: Eliezer Lofts Treating Aikam Hellickson/Extender: Skipper Cliche in Treatment: 8 Vital Signs Time Taken: 08:21 Temperature (F): 97.9 Height (in): 68 Pulse (bpm): 66 Weight (lbs): 185 Respiratory Rate (breaths/min): 16 Body Mass Index (BMI): 28.1 Blood Pressure (mmHg): 108/62 Capillary Blood Glucose (mg/dl): 154 Reference Range: 80 - 120 mg / dl Electronic Signature(s) Signed: 08/25/2021 9:02:58 AM By: Donavan Burnet CHT, EMT, BS Entered By: Donavan Burnet on 08/25/2021 09:02:58

## 2021-08-26 ENCOUNTER — Encounter: Payer: 59 | Admitting: Physician Assistant

## 2021-08-26 DIAGNOSIS — N3041 Irradiation cystitis with hematuria: Secondary | ICD-10-CM | POA: Diagnosis not present

## 2021-08-26 LAB — GLUCOSE, CAPILLARY
Glucose-Capillary: 172 mg/dL — ABNORMAL HIGH (ref 70–99)
Glucose-Capillary: 122 mg/dL — ABNORMAL HIGH (ref 70–99)

## 2021-08-26 NOTE — Progress Notes (Addendum)
TRASEAN, DELIMA ([id number]) Visit Report for 08/26/2021 Arrival Information Details Patient Name: Dennis Hicks, Dennis Hicks Date of Service: 08/26/2021 8:00 AM Medical Record Number: [id number] Patient Account Number: [id number] Date of Birth/Sex: January 30, 1956 (65 y.o. M) Treating RN: Carlene Coria Primary Care Janace Decker: Eliezer Lofts Other Clinician: Donavan Burnet Referring Ginny Loomer: Eliezer Lofts Treating Kevin Space/Extender: Skipper Cliche in Treatment: 8 Visit Information History Since Last Visit All ordered tests and consults were completed: Yes Patient Arrived: Ambulatory Added or deleted any medications: No Arrival Time: 07:55 Any new allergies or adverse reactions: No Accompanied By: self Had a fall or experienced change in No Transfer Assistance: None activities of daily living that may affect Patient Identification Verified: Yes risk of falls: Secondary Verification Process Completed: Yes Signs or symptoms of abuse/neglect since last visito No Patient Requires Transmission-Based Precautions: No Hospitalized since last visit: No Patient Has Alerts: No Implantable device outside of the clinic excluding No cellular tissue based products placed in the center since last visit: Pain Present Now: No Electronic Signature(s) Signed: 08/26/2021 9:08:27 AM By: Donavan Burnet CHT, EMT, BS Entered By: Donavan Burnet on 08/26/2021 09:08:26 Dennis Hicks ([id number]) -------------------------------------------------------------------------------- Encounter Discharge Information Details Patient Name: Dennis Hicks Date of Service: 08/26/2021 8:00 AM Medical Record Number: [id number] Patient Account Number: [id number] Date of Birth/Sex: 01-18-1956 (65 y.o. M) Treating RN: Carlene Coria Primary Care Janvi Ammar: Eliezer Lofts Other Clinician: Donavan Burnet Referring Jakarie Pember: Eliezer Lofts Treating Kendryck Lacroix/Extender: Skipper Cliche in Treatment: 8 Encounter Discharge Information  Items Discharge Condition: Stable Ambulatory Status: Ambulatory Discharge Destination: Home Transportation: Private Auto Accompanied By: self Schedule Follow-up Appointment: No Clinical Summary of Care: Electronic Signature(s) Signed: 08/26/2021 10:48:19 AM By: Donavan Burnet CHT, EMT, BS Entered By: Donavan Burnet on 08/26/2021 10:48:19 Dennis Hicks ([id number]) -------------------------------------------------------------------------------- Vitals Details Patient Name: Dennis Hicks Date of Service: 08/26/2021 8:00 AM Medical Record Number: [id number] Patient Account Number: [id number] Date of Birth/Sex: Aug 28, 1955 (65 y.o. M) Treating RN: Carlene Coria Primary Care Calogero Geisen: Eliezer Lofts Other Clinician: Donavan Burnet Referring Larri Yehle: Eliezer Lofts Treating Adalind Weitz/Extender: Skipper Cliche in Treatment: 8 Vital Signs Time Taken: 07:58 Temperature (F): 98.7 Height (in): 68 Pulse (bpm): 78 Weight (lbs): 185 Respiratory Rate (breaths/min): 16 Body Mass Index (BMI): 28.1 Blood Pressure (mmHg): 116/66 Capillary Blood Glucose (mg/dl): 172 Reference Range: 80 - 120 mg / dl Electronic Signature(s) Signed: 08/26/2021 9:08:52 AM By: Donavan Burnet CHT, EMT, BS Entered By: Donavan Burnet on 08/26/2021 09:08:52

## 2021-08-26 NOTE — Progress Notes (Addendum)
WHEELER, INCORVAIA ([id number]) Visit Report for 08/26/2021 HBO Details Patient Name: Dennis Hicks, Dennis Hicks Date of Service: 08/26/2021 8:00 AM Medical Record Number: [id number] Patient Account Number: [id number] Date of Birth/Sex: 16-Apr-1955 (65 y.o. 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 HBO Treatment Details Treatment Number: 38 Patient Type: Outpatient Chamber Type: Monoplace Chamber Serial #: [id number] Treatment Protocol: 2.5 ATA with 90 minutes oxygen, with two 5 minute air breaks Treatment Details Compression Rate Down: 2.0 psi / minute De-Compression Rate Up: 2.0 psi / minute Compress Tx Pressure Air breaks and breathing periods Decompress Decompress Begins Reached (leave unused spaces blank) Begins Ends Chamber Pressure (ATA) 1 2.5 2.5 2.5 2.5 2.5 - - 2.5 1 Clock Time (24 hr) 08:16 08:27 08:57 09:02 09:32 09:37 - - 10:07 10:18 Treatment Length: 122 (minutes) Treatment Segments: 4 Vital Signs Capillary Blood Glucose Reference Range: 80 - 120 mg / dl HBO Diabetic Blood Glucose Intervention Range: <131 mg/dl or >249 mg/dl Time Vitals Blood Respiratory Capillary Blood Glucose Pulse Action Type: Pulse: Temperature: Taken: Pressure: Rate: Glucose (mg/dl): Meter #: Oximetry (%) Taken: Pre 07:58 116/66 78 16 98.7 172 1 none per protocol Post 10:21 120/76 84 18 97.9 122 1 none per protocol Treatment Response Treatment Toleration: Well Treatment Completion Treatment Completed without Adverse Event Status: Electronic Signature(s) Signed: 08/26/2021 10:40:30 AM By: Donavan Burnet CHT, EMT, BS Signed: 08/26/2021 4:11:43 PM By: Worthy Keeler PA-C Previous Signature: 08/26/2021 10:14:04 AM Version By: Donavan Burnet CHT, EMT, BS Previous Signature: 08/26/2021 9:12:50 AM Version By: Donavan Burnet CHT, EMT, BS Entered By: Donavan Burnet on 08/26/2021 10:40:30 Dennis Hicks ([id number]) -------------------------------------------------------------------------------- HBO Safety Checklist Details Patient Name: Dennis Hicks Date of Service: 08/26/2021 8:00 AM Medical Record Number: [id number] Patient Account Number: [id number] Date of Birth/Sex: [birth year] (65 y.o. M) Treating RN: Carlene Coria Primary Care Gibson Telleria: Eliezer Lofts Other Clinician: Donavan Burnet Referring Gavinn Collard: Eliezer Lofts Treating Nichlos Kunzler/Extender: Skipper Cliche in Treatment: 8 HBO Safety Checklist Items Safety Checklist Consent Form Signed Patient voided / foley secured and emptied When did you last eato 0700 Last dose of injectable or oral agent 0700 Ostomy pouch emptied and vented if applicable NA All implantable devices assessed, documented and approved Artificial Urinary Sphincter Intravenous access site secured and place NA Valuables secured Linens and cotton and cotton/polyester blend (less than 51% polyester) Personal oil-based products / skin lotions / body lotions removed Wigs or hairpieces removed NA Smoking or tobacco materials removed NA Books / newspapers / magazines / loose paper removed Cologne, aftershave, perfume and deodorant removed Jewelry removed (may wrap wedding band) Make-up removed NA Hair care products removed Battery operated devices (external) removed Heating patches and chemical warmers removed Titanium eyewear removed NA Nail polish cured greater than 10 hours NA Casting material cured greater than 10 hours NA Hearing aids removed NA Loose dentures or partials removed NA Prosthetics have been removed NA Patient demonstrates correct use of air break device (if applicable) Patient  concerns have been addressed Patient grounding bracelet on and cord attached to chamber Specifics for Inpatients (complete in addition to above) NA Medication sheet sent with patient Intravenous medications needed or due during therapy sent with patient NA Drainage tubes (e.g. nasogastric tube or chest  tube secured and vented) NA Endotracheal or Tracheotomy tube secured NA Cuff deflated of air and inflated with saline NA Airway suctioned NA Notes Paper version used prior to treatment. Electronic Signature(s) Signed: 08/26/2021 9:10:10 AM By: Donavan Burnet CHT, EMT, BS Entered By: Donavan Burnet on 08/26/2021 09:10:10

## 2021-08-27 ENCOUNTER — Encounter: Payer: 59 | Admitting: Physician Assistant

## 2021-08-28 ENCOUNTER — Telehealth: Payer: Self-pay | Admitting: Family Medicine

## 2021-08-28 DIAGNOSIS — E1122 Type 2 diabetes mellitus with diabetic chronic kidney disease: Secondary | ICD-10-CM

## 2021-08-28 DIAGNOSIS — E785 Hyperlipidemia, unspecified: Secondary | ICD-10-CM

## 2021-08-28 NOTE — Telephone Encounter (Signed)
-----   Message from Ellamae Sia sent at 08/26/2021  3:24 PM EDT ----- Regarding: Lab orders for Tuesday, 6.27.23 Patient is scheduled for CPX labs, please order future labs, Thanks , Karna Christmas

## 2021-09-01 NOTE — Progress Notes (Signed)
OUMAR, MARCOTT ([id number]) Visit Report for 07/19/2021 Physician Orders Details Patient Name: Dennis Hicks, Dennis Hicks Date of Service: 07/19/2021 8:00 AM Medical Record Number: [id number] Patient Account Number: [id number] Date of Birth/Sex: [birth year] (65 y.o. M) Treating RN: Cornell Barman Primary Care Provider: Eliezer Lofts Other Clinician: Jacqulyn Bath Referring Provider: Eliezer Lofts Treating Provider/Extender: Skipper Cliche in Treatment: 3 Verbal / Phone Orders: No Diagnosis Coding ICD-10 Coding Code Description N30.41 Irradiation cystitis with hematuria N39.3 Stress incontinence (male) (male) Z85.46 Personal history of malignant neoplasm of prostate E11.69 Type 2 diabetes mellitus with other specified complication Electronic Signature(s) Signed: 08/31/2021 12:10:46 PM By: Gretta Cool, BSN, RN, CWS, Kim RN, BSN Signed: 09/01/2021 8:45:50 AM By: Worthy Keeler PA-C Entered By: Gretta Cool, BSN, RN, CWS, Kim on 08/31/2021 12:10:46 Dennis Hicks ([id number]) -------------------------------------------------------------------------------- Problem List Details Patient Name: Dennis Hicks Date of Service: 07/19/2021 8:00 AM Medical Record Number: [id number] Patient Account Number: [id number] Date of Birth/Sex: [birth year] (65 y.o. M) Treating RN: Cornell Barman Primary Care Provider: Eliezer Lofts Other Clinician: Jacqulyn Bath Referring Provider: Eliezer Lofts Treating Provider/Extender: Skipper Cliche in Treatment: 3 Active Problems ICD-10 Encounter Code Description Active Date MDM Diagnosis N30.41 Irradiation cystitis with hematuria 06/25/2021 No Yes N39.3 Stress incontinence (male) (male) 06/25/2021 No Yes Z85.46 Personal history of malignant neoplasm of prostate 06/25/2021 No Yes E11.69 Type 2 diabetes mellitus with other specified complication 03/21/1733 No Yes Inactive Problems Resolved Problems Electronic Signature(s) Signed: 08/31/2021 12:10:30 PM By: Gretta Cool, BSN, RN, CWS, Kim RN,  BSN Signed: 09/01/2021 8:45:50 AM By: Worthy Keeler PA-C Entered By: Gretta Cool, BSN, RN, CWS, Kim on 08/31/2021 12:10:30 Dennis Hicks ([id number]) -------------------------------------------------------------------------------- SuperBill Details Patient Name: Dennis Hicks Date of Service: 07/19/2021 Medical Record Number: [id number] Patient Account Number: [id number] Date of Birth/Sex: Sep 07, 1955 (65 y.o. M) Treating RN: Cornell Barman Primary Care Provider: Eliezer Lofts Other Clinician: Jacqulyn Bath Referring Provider: Eliezer Lofts Treating Provider/Extender: Skipper Cliche in Treatment: 3 Diagnosis Coding ICD-10 Codes Code Description N30.41 Irradiation cystitis with hematuria N39.3 Stress incontinence (male) (male) Z8.46 Personal history of malignant neoplasm of prostate E11.69 Type 2 diabetes mellitus with other specified complication Facility Procedures CPT4 Code: 57972820 Description: (Facility Use Only) HBOT, full body chamber, 64mn Modifier: Quantity: 6 Physician Procedures CPT4 Code: [id number]: [id number] PHYS HYPERBARIC OXYGEN THERAPY Modifier: Quantity: 1 CPT4 Code: Description: ICD-10 Diagnosis Description N30.41 Irradiation cystitis with hematuria E11.69 Type 2 diabetes mellitus with other specified complication Modifier: Quantity: Electronic Signature(s) Signed: 08/31/2021 12:10:41 PM By: WGretta Cool BSN, RN, CWS, Kim RN, BSN Signed: 09/01/2021 8:45:50 AM By: SWorthy KeelerPA-C Previous Signature: 08/31/2021 12:10:27 PM Version By: WGretta Cool BSN, RN, CWS, Kim RN, BSN Previous Signature: 07/19/2021 2:08:16 PM Version By: WEnedina FinnerRCP, RRT, CHT Previous Signature: 07/19/2021 4:35:29 PM Version By: SWorthy KeelerPA-C Entered By: WGretta Cool BSN, RN, CWS, Kim on 08/31/2021 12:10:41

## 2021-09-06 ENCOUNTER — Telehealth: Payer: Self-pay | Admitting: *Deleted

## 2021-09-06 NOTE — Telephone Encounter (Signed)
Transition Care Management Follow-up Telephone Call Date of discharge and from where: 09/03/21 [facility] How have you been since you were released from the hospital? " Was having excruciating pain with penile engorgement X 5 minutes during and after urinating but has not experienced the pain in last day" Any questions or concerns? Yes- says he stopped taking Pyridium yesterday because it was not helping with the dysuria  Items Reviewed: Did the pt receive and understand the discharge instructions provided? Yes  Medications obtained and verified? Yes  Other? No  Any new allergies since your discharge? No  Dietary orders reviewed? Yes Do you have support at home? Yes - wife  Home Care and Equipment/Supplies: Were home health services ordered? no If so, what is the name of the agency? Not applicable  Has the agency set up a time to come to the patient's home? not applicable Were any new equipment or medical supplies ordered?  No What is the name of the medical supply agency? Not applicable Were you able to get the supplies/equipment? not applicable Do you have any questions related to the use of the equipment or supplies? No  Functional Questionnaire: (I = Independent and D = Dependent) ADLs: I  Bathing/Dressing- I  Meal Prep- I  Eating- I  Maintaining continence- I  Transferring/Ambulation- I  Managing Meds- I  Follow up appointments reviewed:  PCP Hospital f/u appt confirmed? Yes  Scheduled to see Dr Diona Browner on 6/30 @ 4:00 pm. [facility] f/u appt confirmed? Yes  Scheduled to see Edythe Clarity- PA on 10/07/21 @ 8:30 am. Are transportation arrangements needed? No  If their condition worsens, is the pt aware to call PCP or go to the Emergency Dept.? Yes Was the patient provided with contact information for the PCP's office or ED? Yes Was to pt encouraged to call back with questions or concerns? Yes  Per patient request , will forward this note to his  local urologist Dr. Hollice Espy.  Kelli Churn RN, CCM, [place] [facility] Management Coordinator  [phone]

## 2021-09-13 ENCOUNTER — Telehealth: Payer: Self-pay | Admitting: Family Medicine

## 2021-09-14 ENCOUNTER — Other Ambulatory Visit: Payer: 59

## 2021-09-15 NOTE — Telephone Encounter (Signed)
Done

## 2021-09-17 ENCOUNTER — Encounter: Payer: 59 | Admitting: Family Medicine

## 2021-09-20 ENCOUNTER — Other Ambulatory Visit: Payer: Self-pay | Admitting: Family Medicine

## 2021-10-14 ENCOUNTER — Encounter: Payer: Self-pay | Admitting: Dermatology

## 2021-10-14 NOTE — Telephone Encounter (Signed)
Called patient regarding MyChart message. He is unable to come for the next two weeks due to being out of town. Next available appointment scheduled. aw

## 2021-10-18 ENCOUNTER — Other Ambulatory Visit (INDEPENDENT_AMBULATORY_CARE_PROVIDER_SITE_OTHER): Payer: 59

## 2021-10-18 ENCOUNTER — Other Ambulatory Visit: Payer: Self-pay | Admitting: Family Medicine

## 2021-10-18 DIAGNOSIS — E1122 Type 2 diabetes mellitus with diabetic chronic kidney disease: Secondary | ICD-10-CM

## 2021-10-18 DIAGNOSIS — N1831 Chronic kidney disease, stage 3a: Secondary | ICD-10-CM

## 2021-10-18 LAB — COMPREHENSIVE METABOLIC PANEL
ALT: 25 U/L (ref 0–53)
AST: 22 U/L (ref 0–37)
Albumin: 4.4 g/dL (ref 3.5–5.2)
Alkaline Phosphatase: 64 U/L (ref 39–117)
BUN: 19 mg/dL (ref 6–23)
CO2: 29 mEq/L (ref 19–32)
Calcium: 9.3 mg/dL (ref 8.4–10.5)
Chloride: 100 mEq/L (ref 96–112)
Creatinine, Ser: 1.07 mg/dL (ref 0.40–1.50)
GFR: 73.24 mL/min (ref >60.00)
Glucose, Bld: 112 mg/dL — ABNORMAL HIGH (ref 70–99)
Potassium: 3.6 mEq/L (ref 3.5–5.1)
Sodium: 138 mEq/L (ref 135–145)
Total Bilirubin: 0.6 mg/dL (ref 0.2–1.2)
Total Protein: 7.1 g/dL (ref 6.0–8.3)

## 2021-10-18 LAB — LIPID PANEL
Cholesterol: 122 mg/dL (ref 0–200)
HDL: 37.1 mg/dL — ABNORMAL LOW (ref >39.00)
LDL Cholesterol: 66 mg/dL (ref 0–99)
NonHDL: 85.04
Total CHOL/HDL Ratio: 3
Triglycerides: 94 mg/dL (ref 0.0–149.0)
VLDL: 18.8 mg/dL (ref 0.0–40.0)

## 2021-10-18 LAB — HEMOGLOBIN A1C: Hgb A1c MFr Bld: 6.4 % (ref 4.6–6.5)

## 2021-10-19 ENCOUNTER — Ambulatory Visit: Payer: 59 | Admitting: Dermatology

## 2021-10-19 NOTE — Progress Notes (Signed)
No critical labs need to be addressed urgently. We will discuss labs in detail at upcoming office visit.   

## 2021-10-22 ENCOUNTER — Encounter: Payer: Self-pay | Admitting: Family Medicine

## 2021-10-22 ENCOUNTER — Ambulatory Visit (INDEPENDENT_AMBULATORY_CARE_PROVIDER_SITE_OTHER): Payer: 59 | Admitting: Family Medicine

## 2021-10-22 VITALS — BP 110/60 | HR 95 | Temp 98.7°F | Ht 65.5 in | Wt 179.1 lb

## 2021-10-22 DIAGNOSIS — M545 Low back pain, unspecified: Secondary | ICD-10-CM

## 2021-10-22 DIAGNOSIS — I152 Hypertension secondary to endocrine disorders: Secondary | ICD-10-CM

## 2021-10-22 DIAGNOSIS — E1159 Type 2 diabetes mellitus with other circulatory complications: Secondary | ICD-10-CM

## 2021-10-22 DIAGNOSIS — E1169 Type 2 diabetes mellitus with other specified complication: Secondary | ICD-10-CM

## 2021-10-22 DIAGNOSIS — E1122 Type 2 diabetes mellitus with diabetic chronic kidney disease: Secondary | ICD-10-CM

## 2021-10-22 DIAGNOSIS — N1831 Chronic kidney disease, stage 3a: Secondary | ICD-10-CM

## 2021-10-22 DIAGNOSIS — Z1211 Encounter for screening for malignant neoplasm of colon: Secondary | ICD-10-CM

## 2021-10-22 DIAGNOSIS — E785 Hyperlipidemia, unspecified: Secondary | ICD-10-CM

## 2021-10-22 DIAGNOSIS — Z0000 Encounter for general adult medical examination without abnormal findings: Secondary | ICD-10-CM | POA: Diagnosis not present

## 2021-10-22 DIAGNOSIS — Z8546 Personal history of malignant neoplasm of prostate: Secondary | ICD-10-CM

## 2021-10-22 DIAGNOSIS — G8929 Other chronic pain: Secondary | ICD-10-CM

## 2021-10-22 DIAGNOSIS — N183 Chronic kidney disease, stage 3 unspecified: Secondary | ICD-10-CM

## 2021-10-22 DIAGNOSIS — N181 Chronic kidney disease, stage 1: Secondary | ICD-10-CM

## 2021-10-22 LAB — HM DIABETES FOOT EXAM

## 2021-10-22 MED ORDER — BLOOD GLUCOSE METER KIT: PACK | 0 refills | Status: DC

## 2021-10-22 MED ORDER — SUMATRIPTAN SUCCINATE 100 MG PO TABS
ORAL_TABLET | ORAL | 0 refills | Status: DC
Start: 2021-10-22 — End: 2023-12-26

## 2021-10-22 NOTE — Progress Notes (Signed)
Patient ID: Dennis Hicks, male    DOB: 07/09/1955, 66 y.o.   MRN: [id number]  This visit was conducted in person.  BP 110/60   Pulse 95   Temp 98.7 F (37.1 C) (Oral)   Ht 5' 5.5" (1.664 m)   Wt 179 lb 2 oz (81.3 kg)   SpO2 97%   BMI 29.35 kg/m    CC:  Chief Complaint  Patient presents with   Annual Exam    Subjective:   HPI: Dennis Hicks is a 66 y.o. male presenting on 10/22/2021 for Annual Exam  Elevated Cholesterol: Improved control with change from simvastatin to atorvastatin 40 mg daily.  LDL at goal less than 100. Lab Results  Component Value Date   CHOL 122 10/18/2021   HDL 37.10 (L) 10/18/2021   LDLCALC 66 10/18/2021   LDLDIRECT 158.2 01/03/2013   TRIG 94.0 10/18/2021   CHOLHDL 3 10/18/2021  Using medications without problems: none Muscle aches:  none Diet compliance: hearth healthy diet Exercise:  several days a week now has restarted. Has lost 14 lbs Wt Readings from Last 3 Encounters:  10/22/21 179 lb 2 oz (81.3 kg)  12/31/20 193 lb 1 oz (87.6 kg)  11/20/20 185 lb (83.9 kg)  Body mass index is 29.35 kg/m.   Other complaints:  Hypertension:   Well-controlled on benazepril hydrochlorothiazide 20/25 mg p.o. daily BP Readings from Last 3 Encounters:  10/22/21 110/60  12/31/20 120/64  11/20/20 120/74    Using medication without problems or lightheadedness: none Chest pain with exertion: None Edema: None Short of breath: None Average home BPs: Other issues:  Diabetes: Excellent control on metformin 500 mg XR 2 tablets daily. Lab Results  Component Value Date   HGBA1C 6.4 10/18/2021  Using medications without difficulties: Hypoglycemic episodes:? Hyperglycemic episodes:? Feet problems: no ulcers Blood Sugars averaging: not checking eye exam within last year: no  CKD: improved GFR 73  Hx of prostate cancer: Followed by urology   Chronic migraine:  No migraines in last year. Using sumatriptan 100 mg as needed for  migraine   Indication for chronic opioid:  Low back pain and neck pain Medication and dose:  Hydrocodone 5/325 mg daily prn. He has had 2 additional prescriptions of medication.Marland Kitchen oxycodone earlier this year after bacteremia due to Proteus.Marland Kitchen urethral was eroded after AUS insertion  He has now controlled pain with tylenol. Hydrocodone causes itching. # pills per  3 month: 45 Has not needed to use very often has plenty. Last UDS date:  Opioid Treatment Agreement signed (Y/N):  12/2020 Opioid Treatment Agreement last reviewed with patient:   12/2020 [place]CCSRS reviewed this encounter (include red flags): PDMP reviewed during this encounter.   Relevant past medical, surgical, family and social history reviewed and updated as indicated. Interim medical history since our last visit reviewed. Allergies and medications reviewed and updated. Outpatient Medications Prior to Visit  Medication Sig Dispense Refill   atorvastatin (LIPITOR) 40 MG tablet Take 1 tablet (40 mg total) by mouth daily. 90 tablet 3   benazepril-hydrochlorthiazide (LOTENSIN HCT) 20-25 MG tablet TAKE 1 TABLET DAILY 90 tablet 0   betamethasone dipropionate 0.05 % cream Apply topically 2 (two) times daily. 30 g 0   HYDROcodone-acetaminophen (NORCO/VICODIN) 5-325 MG tablet Take 1 tablet by mouth daily as needed for moderate pain. 45 tablet 0   metFORMIN (GLUCOPHAGE-XR) 500 MG 24 hr tablet TAKE 2 TABLETS DAILY WITH BREAKFAST 180 tablet 0   SUMAtriptan (IMITREX) 100 MG tablet TAKE  1 TABLET BY MOUTH EVERY 2 HOURS AS NEEDED, MAX OF 2 TABLETS IN 24 HOURS 10 tablet 0   azithromycin (ZITHROMAX) 250 MG tablet 2 tab po x 1 day then 1 tab po daily 6 tablet 0   Famotidine (PEPCID PO) Take 1 tablet by mouth as needed.     guaiFENesin-codeine (ROBITUSSIN AC) 100-10 MG/5ML syrup Take 5-10 mLs by mouth at bedtime as needed for cough. 180 mL 0   No facility-administered medications prior to visit.     Per HPI unless specifically indicated in ROS  section below Review of Systems  Constitutional:  Negative for fatigue and fever.  HENT:  Negative for ear pain.   Eyes:  Negative for pain.  Respiratory:  Negative for cough and shortness of breath.   Cardiovascular:  Negative for chest pain, palpitations and leg swelling.  Gastrointestinal:  Negative for abdominal pain.  Genitourinary:  Negative for dysuria.  Musculoskeletal:  Negative for arthralgias.  Neurological:  Negative for syncope, light-headedness and headaches.  Psychiatric/Behavioral:  Negative for dysphoric mood.    Objective:  BP 110/60   Pulse 95   Temp 98.7 F (37.1 C) (Oral)   Ht 5' 5.5" (1.664 m)   Wt 179 lb 2 oz (81.3 kg)   SpO2 97%   BMI 29.35 kg/m   Wt Readings from Last 3 Encounters:  10/22/21 179 lb 2 oz (81.3 kg)  12/31/20 193 lb 1 oz (87.6 kg)  11/20/20 185 lb (83.9 kg)      Physical Exam Constitutional:      General: He is not in acute distress.    Appearance: Normal appearance. He is well-developed. He is not ill-appearing or toxic-appearing.  HENT:     Head: Normocephalic and atraumatic.     Right Ear: Hearing, tympanic membrane, ear canal and external ear normal.     Left Ear: Hearing, tympanic membrane, ear canal and external ear normal.     Nose: Nose normal.     Mouth/Throat:     Pharynx: Uvula midline.  Eyes:     General: Lids are normal. Lids are everted, no foreign bodies appreciated.     Conjunctiva/sclera: Conjunctivae normal.     Pupils: Pupils are equal, round, and reactive to light.  Neck:     Thyroid: No thyroid mass or thyromegaly.     Vascular: No carotid bruit.     Trachea: Trachea and phonation normal.  Cardiovascular:     Rate and Rhythm: Normal rate and regular rhythm.     Pulses: Normal pulses.     Heart sounds: S1 normal and S2 normal. No murmur heard.    No gallop.  Pulmonary:     Breath sounds: Normal breath sounds. No wheezing, rhonchi or rales.  Abdominal:     General: Bowel sounds are normal.      Palpations: Abdomen is soft.     Tenderness: There is no abdominal tenderness. There is no guarding or rebound.     Hernia: No hernia is present.  Musculoskeletal:     Cervical back: Normal range of motion and neck supple.  Lymphadenopathy:     Cervical: No cervical adenopathy.  Skin:    General: Skin is warm and dry.     Findings: No rash.  Neurological:     Mental Status: He is alert.     Cranial Nerves: No cranial nerve deficit.     Sensory: No sensory deficit.     Gait: Gait normal.     Deep Tendon  Reflexes: Reflexes are normal and symmetric.  Psychiatric:        Speech: Speech normal.        Behavior: Behavior normal.        Judgment: Judgment normal.       Diabetic foot exam: Normal inspection No skin breakdown No calluses  Normal DP pulses Normal sensation to light touch and monofilament Nails normal  Results for orders placed or performed in visit on 10/18/21  Comprehensive metabolic panel  Result Value Ref Range   Sodium 138 135 - 145 mEq/L   Potassium 3.6 3.5 - 5.1 mEq/L   Chloride 100 96 - 112 mEq/L   CO2 29 19 - 32 mEq/L   Glucose, Bld 112 (H) 70 - 99 mg/dL   BUN 19 6 - 23 mg/dL   Creatinine, Ser 1.07 0.40 - 1.50 mg/dL   Total Bilirubin 0.6 0.2 - 1.2 mg/dL   Alkaline Phosphatase 64 39 - 117 U/L   AST 22 0 - 37 U/L   ALT 25 0 - 53 U/L   Total Protein 7.1 6.0 - 8.3 g/dL   Albumin 4.4 3.5 - 5.2 g/dL   GFR 73.24 >60.00 mL/min   Calcium 9.3 8.4 - 10.5 mg/dL  Lipid panel  Result Value Ref Range   Cholesterol 122 0 - 200 mg/dL   Triglycerides 94.0 0.0 - 149.0 mg/dL   HDL 37.10 (L) >39.00 mg/dL   VLDL 18.8 0.0 - 40.0 mg/dL   LDL Cholesterol 66 0 - 99 mg/dL   Total CHOL/HDL Ratio 3    NonHDL 85.04   Hemoglobin A1c  Result Value Ref Range   Hgb A1c MFr Bld 6.4 4.6 - 6.5 %     COVID 19 screen:  No recent travel or known exposure to COVID19 The patient denies respiratory symptoms of COVID 19 at this time. The importance of social distancing was  discussed today.   Assessment and Plan The patient's preventative maintenance and recommended screening tests for an annual wellness exam were reviewed in full today. Brought up to date unless services declined.  Counselled on the importance of diet, exercise, and its role in overall health and mortality. The patient's FH and SH was reviewed, including their home life, tobacco status, and drug and alcohol status.     Consider shingrix  Not currently interested in colon cancer screening... will consider cologuard.  Prostate cancer.. followed by  urology. Problem List Items Addressed This Visit     Chronic low back pain (Chronic)    Chronic back pain currently well controlled on Tylenol.  He does not currently need to continue hydrocodone.  He did notice side effects of itching along with this.  He did state that if he needs a pain medication in the future he did not seem to have pain with oxycodone.      Hyperlipidemia associated with type 2 diabetes mellitus (HCC) (Chronic)    Improved control with change from simvastatin to atorvastatin 40 mg daily.  LDL at goal less than 100.      Hypertension associated with diabetes ([place]) (Chronic)    Well-controlled on benazepril hydrochlorothiazide 20/25 mg p.o. daily      Type 2 diabetes mellitus with renal complication (HCC) (Chronic)    Excellent control on metformin 500 mg XR 2 tablets daily.  Prescription for glucometer kit sent in along with test strips and lancets.  He will follow blood sugar at home and if numbers are trending down with continued lifestyle change.  We  will consider decreasing metformin and ultimately stopping it.       CKD stage 1 due to type 2 diabetes mellitus (HCC)    Chronic, at least in part due to diabetes.  Improved filtration rate at 73      History of prostate cancer   Other Visit Diagnoses     Routine general medical examination at a health care facility    -  Primary   CKD stage 3 due to type 2  diabetes mellitus ([place])       Colon cancer screening       Relevant Orders   Cologuard       Eliezer Lofts, MD

## 2021-10-22 NOTE — Assessment & Plan Note (Signed)
Excellent control on metformin 500 mg XR 2 tablets daily.  Prescription for glucometer kit sent in along with test strips and lancets.  He will follow blood sugar at home and if numbers are trending down with continued lifestyle change.  We will consider decreasing metformin and ultimately stopping it.

## 2021-10-22 NOTE — Patient Instructions (Addendum)
Set up yearly eye exam for diabetes and have the opthalmologist send [place] a copy of the evaluation for the chart. Look into Cologuard colon cancer screening.  Goal Fasting blood sugar < 120, goal 2 hour after meals  < 180.

## 2021-10-22 NOTE — Assessment & Plan Note (Signed)
Chronic, at least in part due to diabetes.  Improved filtration rate at 73

## 2021-10-22 NOTE — Assessment & Plan Note (Signed)
Chronic back pain currently well controlled on Tylenol.  He does not currently need to continue hydrocodone.  He did notice side effects of itching along with this.  He did state that if he needs a pain medication in the future he did not seem to have pain with oxycodone.

## 2021-10-22 NOTE — Assessment & Plan Note (Signed)
Improved control with change from simvastatin to atorvastatin 40 mg daily.  LDL at goal less than 100.

## 2021-10-22 NOTE — Assessment & Plan Note (Signed)
Well-controlled on benazepril hydrochlorothiazide 20/25 mg p.o. daily

## 2021-11-01 ENCOUNTER — Ambulatory Visit: Payer: 59 | Admitting: Dermatology

## 2021-11-01 DIAGNOSIS — Z1283 Encounter for screening for malignant neoplasm of skin: Secondary | ICD-10-CM

## 2021-11-01 DIAGNOSIS — D225 Melanocytic nevi of trunk: Secondary | ICD-10-CM | POA: Diagnosis not present

## 2021-11-01 DIAGNOSIS — L814 Other melanin hyperpigmentation: Secondary | ICD-10-CM

## 2021-11-01 DIAGNOSIS — Z85820 Personal history of malignant melanoma of skin: Secondary | ICD-10-CM

## 2021-11-01 DIAGNOSIS — D229 Melanocytic nevi, unspecified: Secondary | ICD-10-CM

## 2021-11-01 DIAGNOSIS — D2261 Melanocytic nevi of right upper limb, including shoulder: Secondary | ICD-10-CM | POA: Diagnosis not present

## 2021-11-01 DIAGNOSIS — L578 Other skin changes due to chronic exposure to nonionizing radiation: Secondary | ICD-10-CM

## 2021-11-01 DIAGNOSIS — D1800 Hemangioma unspecified site: Secondary | ICD-10-CM

## 2021-11-01 DIAGNOSIS — L821 Other seborrheic keratosis: Secondary | ICD-10-CM

## 2021-11-01 NOTE — Patient Instructions (Signed)
     Melanoma ABCDEs  Melanoma is the most dangerous type of skin cancer, and is the leading cause of death from skin disease.  You are more likely to develop melanoma if you: Have light-colored skin, light-colored eyes, or red or blond hair Spend a lot of time in the sun Tan regularly, either outdoors or in a tanning bed Have had blistering sunburns, especially during childhood Have a close family member who has had a melanoma Have atypical moles or large birthmarks  Early detection of melanoma is key since treatment is typically straightforward and cure rates are extremely high if we catch it early.   The first sign of melanoma is often a change in a mole or a new dark spot.  The ABCDE system is a way of remembering the signs of melanoma.  A for asymmetry:  The two halves do not match. B for border:  The edges of the growth are irregular. C for color:  A mixture of colors are present instead of an even brown color. D for diameter:  Melanomas are usually (but not always) greater than 6mm - the size of a pencil eraser. E for evolution:  The spot keeps changing in size, shape, and color.  Please check your skin once per month between visits. You can use a small mirror in front and a large mirror behind you to keep an eye on the back side or your body.   If you see any new or changing lesions before your next follow-up, please call to schedule a visit.  Please continue daily skin protection including broad spectrum sunscreen SPF 30+ to sun-exposed areas, reapplying every 2 hours as needed when you're outdoors.   Staying in the shade or wearing long sleeves, sun glasses (UVA+UVB protection) and wide brim hats (4-inch brim around the entire circumference of the hat) are also recommended for sun protection.    Due to recent changes in healthcare laws, you may see results of your pathology and/or laboratory studies on MyChart before the doctors have had a chance to review them. We  understand that in some cases there may be results that are confusing or concerning to you. Please understand that not all results are received at the same time and often the doctors may need to interpret multiple results in order to provide you with the best plan of care or course of treatment. Therefore, we ask that you please give us 2 business days to thoroughly review all your results before contacting the office for clarification. Should we see a critical lab result, you will be contacted sooner.   If You Need Anything After Your Visit  If you have any questions or concerns for your doctor, please call our main line at [phone] and press option 4 to reach your doctor's medical assistant. If no one answers, please leave a voicemail as directed and we will return your call as soon as possible. Messages left after 4 pm will be answered the following business day.   You may also send us a message via MyChart. We typically respond to MyChart messages within 1-2 business days.  For prescription refills, please ask your pharmacy to contact our office. Our fax number is [phone].  If you have an urgent issue when the clinic is closed that cannot wait until the next business day, you can page your doctor at the number below.    Please note that while we do our best to be available for urgent issues   outside of office hours, we are not available 24/7.   If you have an urgent issue and are unable to reach us, you may choose to seek medical care at your doctor's office, retail clinic, urgent care center, or emergency room.  If you have a medical emergency, please immediately call 911 or go to the emergency department.  Pager Numbers  - Dr. Kowalski: [phone]  - Dr. Moye: [phone]  - Dr. Stewart: [phone]  In the event of inclement weather, please call our main line at [phone] for an update on the status of any delays or closures.  Dermatology Medication Tips: Please  keep the boxes that topical medications come in in order to help keep track of the instructions about where and how to use these. Pharmacies typically print the medication instructions only on the boxes and not directly on the medication tubes.   If your medication is too expensive, please contact our office at [phone] option 4 or send us a message through MyChart.   We are unable to tell what your co-pay for medications will be in advance as this is different depending on your insurance coverage. However, we may be able to find a substitute medication at lower cost or fill out paperwork to get insurance to cover a needed medication.   If a prior authorization is required to get your medication covered by your insurance company, please allow us 1-2 business days to complete this process.  Drug prices often vary depending on where the prescription is filled and some pharmacies may offer cheaper prices.  The website www.goodrx.com contains coupons for medications through different pharmacies. The prices here do not account for what the cost may be with help from insurance (it may be cheaper with your insurance), but the website can give you the price if you did not use any insurance.  - You can print the associated coupon and take it with your prescription to the pharmacy.  - You may also stop by our office during regular business hours and pick up a GoodRx coupon card.  - If you need your prescription sent electronically to a different pharmacy, notify our office through [facility] MyChart or by phone at [phone] option 4.     Si Usted Necesita Algo Despus de Su Visita  Tambin puede enviarnos un mensaje a travs de MyChart. Por lo general respondemos a los mensajes de MyChart en el transcurso de 1 a 2 das hbiles.  Para renovar recetas, por favor pida a su farmacia que se ponga en contacto con nuestra oficina. Nuestro nmero de fax es el [phone].  Si tiene un asunto urgente  cuando la clnica est cerrada y que no puede esperar hasta el siguiente da hbil, puede llamar/localizar a su doctor(a) al nmero que aparece a continuacin.   Por favor, tenga en cuenta que aunque hacemos todo lo posible para estar disponibles para asuntos urgentes fuera del horario de oficina, no estamos disponibles las 24 horas del da, los 7 das de la semana.   Si tiene un problema urgente y no puede comunicarse con nosotros, puede optar por buscar atencin mdica  en el consultorio de su doctor(a), en una clnica privada, en un centro de atencin urgente o en una sala de emergencias.  Si tiene una emergencia mdica, por favor llame inmediatamente al 911 o vaya a la sala de emergencias.  Nmeros de bper  - Dr. Kowalski: [phone]  - Dra. Moye: [phone]  - Dra. Stewart: [phone]  En caso   de inclemencias del tiempo, por favor llame a nuestra lnea principal al [phone] para una actualizacin sobre el estado de cualquier retraso o cierre.  Consejos para la medicacin en dermatologa: Por favor, guarde las cajas en las que vienen los medicamentos de uso tpico para ayudarle a seguir las instrucciones sobre dnde y cmo usarlos. Las farmacias generalmente imprimen las instrucciones del medicamento slo en las cajas y no directamente en los tubos del medicamento.   Si su medicamento es muy caro, por favor, pngase en contacto con nuestra oficina llamando al [phone] y presione la opcin 4 o envenos un mensaje a travs de MyChart.   No podemos decirle cul ser su copago por los medicamentos por adelantado ya que esto es diferente dependiendo de la cobertura de su seguro. Sin embargo, es posible que podamos encontrar un medicamento sustituto a menor costo o llenar un formulario para que el seguro cubra el medicamento que se considera necesario.   Si se requiere una autorizacin previa para que su compaa de seguros cubra su medicamento, por favor permtanos de 1 a 2  das hbiles para completar este proceso.  Los precios de los medicamentos varan con frecuencia dependiendo del lugar de dnde se surte la receta y alguna farmacias pueden ofrecer precios ms baratos.  El sitio web www.goodrx.com tiene cupones para medicamentos de diferentes farmacias. Los precios aqu no tienen en cuenta lo que podra costar con la ayuda del seguro (puede ser ms barato con su seguro), pero el sitio web puede darle el precio si no utiliz ningn seguro.  - Puede imprimir el cupn correspondiente y llevarlo con su receta a la farmacia.  - Tambin puede pasar por nuestra oficina durante el horario de atencin regular y recoger una tarjeta de cupones de GoodRx.  - Si necesita que su receta se enve electrnicamente a una farmacia diferente, informe a nuestra oficina a travs de MyChart de [facility] o por telfono llamando al [phone] y presione la opcin 4.  

## 2021-11-01 NOTE — Progress Notes (Signed)
Follow-Up Visit   Subjective  Dennis Hicks is a 66 y.o. male who presents for the following: Follow-up.  The patient presents for 6 month follow-up and Total-Body Skin Exam (TBSE) for skin cancer screening and mole check.  The patient has spots, moles and lesions to be evaluated, some may be new or changing and the patient has concerns that these could be cancer. Patient had a spot on his back to check, but area has gone away now. He had been staining at his house, and thinks it could have been that. He has a history of melanoma of the right lateral chest (2013). History of AKs.    The following portions of the chart were reviewed this encounter and updated as appropriate:       Review of Systems:  No other skin or systemic complaints except as noted in HPI or Assessment and Plan.  Objective  Well appearing patient in no apparent distress; mood and affect are within normal limits.  A full examination was performed including scalp, head, eyes, ears, nose, lips, neck, chest, axillae, abdomen, back, buttocks, bilateral upper extremities, bilateral lower extremities, hands, feet, fingers, toes, fingernails, and toenails. All findings within normal limits unless otherwise noted below.  R lateral chest Well healed scar with no evidence of recurrence  R med chest, R shoulder right medial chest 0.5 x 0.4 cm med light brown macule    right shoulder 7 mm tan speckled flesh papule   Left Upper Forehead 5.0 x 3.0 mm brown macule appears as 2 adjacent medium brown macules    Assessment & Plan  History of melanoma R lateral chest  Clark's level III/early IV, Breslow's 0.64m, excised 02/20/2012  Clear. Observe for recurrence. Call clinic for new or changing lesions.  Recommend regular skin exams, daily broad-spectrum spf 30+ sunscreen use, and photoprotection.    Nevus R med chest, R shoulder  Benign-appearing.  Stable. Observation.  Call clinic for new or changing moles.   Recommend daily use of broad spectrum spf 30+ sunscreen to sun-exposed areas.   Lentigo Left Upper Forehead  Benign, observe.    Skin cancer screening performed today.  Actinic Damage - chronic, secondary to cumulative UV radiation exposure/sun exposure over time - diffuse scaly erythematous macules with underlying dyspigmentation - Recommend daily broad spectrum sunscreen SPF 30+ to sun-exposed areas, reapply every 2 hours as needed.  - Recommend staying in the shade or wearing long sleeves, sun glasses (UVA+UVB protection) and wide brim hats (4-inch brim around the entire circumference of the hat). - Call for new or changing lesions.  Lentigines - Scattered tan macules - Due to sun exposure - Benign-appering, observe - Recommend daily broad spectrum sunscreen SPF 30+ to sun-exposed areas, reapply every 2 hours as needed. - Call for any changes  Seborrheic Keratoses - Stuck-on, waxy, tan-brown papules and/or plaques  - Benign-appearing - Discussed benign etiology and prognosis. - Observe - Call for any changes  Hemangiomas - Red papules - Discussed benign nature - Observe - Call for any changes  Melanocytic Nevi - Tan-brown and/or pink-flesh-colored symmetric macules and papules, including left clavicle - Benign appearing on exam today - Observation - Call clinic for new or changing moles - Recommend daily use of broad spectrum spf 30+ sunscreen to sun-exposed areas.    Return in about 6 months (around 05/04/2022) for TBSE, Hx melanoma.  ILindi Adie CMA, am acting as scribe for TBrendolyn Patty MD .  Documentation: I have reviewed the above  documentation for accuracy and completeness, and I agree with the above.  Brendolyn Patty MD

## 2021-11-22 ENCOUNTER — Encounter: Payer: Self-pay | Admitting: Urology

## 2021-11-23 ENCOUNTER — Other Ambulatory Visit: Payer: Self-pay | Admitting: *Deleted

## 2021-11-23 DIAGNOSIS — C61 Malignant neoplasm of prostate: Secondary | ICD-10-CM

## 2021-11-24 ENCOUNTER — Ambulatory Visit: Payer: 59 | Admitting: Urology

## 2021-11-24 ENCOUNTER — Other Ambulatory Visit
Admission: RE | Admit: 2021-11-24 | Discharge: 2021-11-24 | Disposition: A | Discharge status: Home / Self Care | Payer: 59 | Attending: Urology | Admitting: Urology

## 2021-11-24 DIAGNOSIS — C61 Malignant neoplasm of prostate: Secondary | ICD-10-CM | POA: Diagnosis not present

## 2021-11-24 LAB — PSA: Prostatic Specific Antigen: <0.01 ng/mL (ref 0.00–4.00)

## 2021-11-25 NOTE — Progress Notes (Signed)
11/26/2021 9:08 AM   Dennis Hicks 26-Apr-1955 [id number]  Referring provider: Jinny Sanders, MD [id number] [place],  [place] [address]  Chief Complaint  Patient presents with   Prostate Cancer    HPI: 66 year old male with a personal history of prostate cancer who presents today for annual follow-up.  Surgical pathology consistent with high risk Gleason 4+5 prostate cancer with invasion of the bladder neck, extraprostatic extension, negative margins, negative lymph nodes, no seminal vesicle involvement.  pT3a N0 MX.  Adjuvant XRT completed 01/2019.  PSA remains undetectable as of 11/2021  Unfortunately, it looks like he has had a difficult year.  He was referred to Dr. Alphonzo Severance and ultimately underwent artificial urinary sphincter placement.  It was preceded by removal of a Weck clip from the urethrovesical anastomosis.  It was complicated by urinary sepsis and subsequently found to have of an erosion.  Most recently, he underwent explant of his AUS on 09/15/2021.   Is also been struggling with hematuria from radiation cystitis.  He is undergoing hyperbaric oxygen treatments.    He is no longer having issues with urinary bleeding after he completed hyperbaric oxygen treatments.  He is extremely disappointed today.  He reports that he was actually starting to regain some events continence by doing physical exercises prior to all of this happening.  Since the explant, he is not even able to control his stream like he was preop.  He is also extremely interested in having a penile prosthesis with plans to have it placed after the artificial urinary sphincter however this is a major setback.    He has been referred to physical therapy in [place] at our main campus but is more interested in doing this and [place].  PMH: Past Medical History:  Diagnosis Date   Actinic keratosis    Borderline diabetes    Bronchitis    COVID-19 virus infection 04/16/2020   Diabetes mellitus  without complication ([place])    Headache    Heartburn    HLD (hyperlipidemia)    Hypertension    Melanoma ([place]) 01/24/2012   R lat chest, Clark's level III/early IV, Breslow's 0.32m, excised 02/20/2012   Pneumonia    x2 Aug. 2017   Psoriasis    Renal insufficiency     Surgical History: Past Surgical History:  Procedure Laterality Date   FINGER GANGLION CYST EXCISION     PELVIC LYMPH NODE DISSECTION Bilateral 02/19/2018   Procedure: PELVIC LYMPH NODE DISSECTION;  Surgeon: BHollice Espy MD;  Location: [facility] ORS;  Service: Urology;  Laterality: Bilateral;   ROBOT ASSISTED LAPAROSCOPIC RADICAL PROSTATECTOMY N/A 02/19/2018   Procedure: ROBOTIC ASSISTED LAPAROSCOPIC RADICAL PROSTATECTOMY;  Surgeon: BHollice Espy MD;  Location: [facility] ORS;  Service: Urology;  Laterality: N/A;    Home Medications:  Allergies as of 11/26/2021       Reactions   Chocolate Other (See Comments)   Trigger Migraine   Ibuprofen Other (See Comments)   Kidney issues   Shrimp [shellfish Allergy] Other (See Comments)   Trigger Migraines        Medication List        Accurate as of November 26, 2021  9:08 AM. If you have any questions, ask your nurse or doctor.          atorvastatin 40 MG tablet Commonly known as: LIPITOR Take 1 tablet (40 mg total) by mouth daily.   benazepril-hydrochlorthiazide 20-25 MG tablet Commonly known as: LOTENSIN HCT TAKE 1 TABLET DAILY  betamethasone dipropionate 0.05 % cream Apply topically 2 (two) times daily.   blood glucose meter kit and supplies Dispense based on patient and insurance preference. Use up to four times daily as directed. (FOR ICD-10 E10.9, E11.9).   HYDROcodone-acetaminophen 5-325 MG tablet Commonly known as: NORCO/VICODIN Take 1 tablet by mouth daily as needed for moderate pain.   metFORMIN 500 MG 24 hr tablet Commonly known as: GLUCOPHAGE-XR TAKE 2 TABLETS DAILY WITH BREAKFAST   SUMAtriptan 100 MG tablet Commonly known as: IMITREX TAKE  1 TABLET BY MOUTH EVERY 2 HOURS AS NEEDED, MAX OF 2 TABLETS IN 24 HOURS        Allergies:  Allergies  Allergen Reactions   Chocolate Other (See Comments)    Trigger Migraine   Ibuprofen Other (See Comments)    Kidney issues   Shrimp [Shellfish Allergy] Other (See Comments)    Trigger Migraines    Family History: Family History  Problem Relation Age of [place]   Hypertension Mother    Coronary artery disease Father    Hypertension Father    Hyperlipidemia Father    Diabetes Father    Prostate cancer Neg Hx    Kidney cancer Neg Hx    Bladder Cancer Neg Hx     Social History:  reports that he has never smoked. He has never used smokeless tobacco. He reports current alcohol use. He reports that he does not currently use drugs.   Physical Exam: BP 124/77   Pulse 90   Ht 5' 5.5" (1.664 m)   Wt 180 lb (81.6 kg)   BMI 29.50 kg/m   Constitutional:  Alert and oriented, No acute distress. HEENT: [place] AT, moist mucus membranes.  Trachea midline, no masses. Neurologic: Grossly intact, no focal deficits, moving all 4 extremities. Psychiatric: Normal mood and affect.  Laboratory Data: Lab Results  Component Value Date   WBC 8.0 01/21/2019   HGB 15.6 01/21/2019   HCT 46.7 01/21/2019   MCV 88.8 01/21/2019   PLT 292 01/21/2019    Lab Results  Component Value Date   CREATININE 1.07 10/18/2021    Lab Results  Component Value Date   HGBA1C 6.4 10/18/2021    Assessment & Plan:    1. Prostate cancer ([place]) High risk prostate cancer status post robotic prostatectomy with salvage radiation  PSA remains undetectable, will continue to check PSA every 6 months  Lab only in 6 months and MD visit in a year - PSA; Future - PSA; Future  2. Stress incontinence Disappointing outcome following AUS infection and erosion  Interested in referral to physical therapy here and [place] which was placed today - Ambulatory referral to Physical Therapy   3. Radiation cystitis Status  post hyperbaric oxygen with significant improvement  4.  Erectile dysfunction following radical prostatectomy Possibly considering IPP down the road, will continue to work with Dr. Francesca Jewett at [facility]  PSA only in 6 months, follow-up with MD in a year with PSA prior  Hollice Espy, MD  [facility] [address], [place], [place] [address] [phone]  I spent 41 total minutes on the day of the encounter including pre-visit review of the medical record, face-to-face time with the patient, and post visit ordering of labs/imaging/tests.  Extensive records review of [facility] in preparation for today's visit documentation as well as over 30 minutes face-to-face with the patient.

## 2021-11-26 ENCOUNTER — Ambulatory Visit (INDEPENDENT_AMBULATORY_CARE_PROVIDER_SITE_OTHER): Payer: 59 | Admitting: Urology

## 2021-11-26 ENCOUNTER — Encounter: Payer: Self-pay | Admitting: Urology

## 2021-11-26 VITALS — BP 124/77 | HR 90 | Ht 65.5 in | Wt 180.0 lb

## 2021-11-26 DIAGNOSIS — Z8546 Personal history of malignant neoplasm of prostate: Secondary | ICD-10-CM

## 2021-11-26 DIAGNOSIS — N393 Stress incontinence (female) (male): Secondary | ICD-10-CM

## 2021-11-26 DIAGNOSIS — N5231 Erectile dysfunction following radical prostatectomy: Secondary | ICD-10-CM

## 2021-11-26 DIAGNOSIS — N3040 Irradiation cystitis without hematuria: Secondary | ICD-10-CM | POA: Diagnosis not present

## 2021-11-26 DIAGNOSIS — C61 Malignant neoplasm of prostate: Secondary | ICD-10-CM

## 2021-12-06 ENCOUNTER — Ambulatory Visit: Payer: 59 | Admitting: Dermatology

## 2021-12-08 ENCOUNTER — Other Ambulatory Visit: Payer: Self-pay | Admitting: Family Medicine

## 2021-12-20 ENCOUNTER — Other Ambulatory Visit: Payer: Self-pay | Admitting: Family Medicine

## 2021-12-29 ENCOUNTER — Telehealth (INDEPENDENT_AMBULATORY_CARE_PROVIDER_SITE_OTHER): Payer: 59 | Admitting: Family

## 2021-12-29 ENCOUNTER — Encounter: Payer: Self-pay | Admitting: Family

## 2021-12-29 VITALS — Temp 99.0°F | Ht 60.5 in | Wt 177.0 lb

## 2021-12-29 DIAGNOSIS — J069 Acute upper respiratory infection, unspecified: Secondary | ICD-10-CM

## 2021-12-29 MED ORDER — PREDNISONE 20 MG PO TABS: ORAL_TABLET | ORAL | 0 refills | Status: DC

## 2021-12-29 MED ORDER — GUAIFENESIN-CODEINE 100-10 MG/5ML PO SYRP: 5.0000 mL | ORAL_SOLUTION | Freq: Three times a day (TID) | ORAL | 0 refills | Status: DC | PRN

## 2021-12-29 NOTE — Progress Notes (Signed)
MyChart Video Visit    Virtual Visit via Video Note   This format is felt to be most appropriate for this patient at this time. Physical exam was limited by quality of the video and audio technology used for the visit. CMA was able to get the patient set up on a video visit.  Patient location: Home. Patient and provider in visit Provider location: Office  I discussed the limitations of evaluation and management by telemedicine and the availability of in person appointments. The patient expressed understanding and agreed to proceed.  Visit Date: 12/29/2021  Today's healthcare provider: Jeanie Sewer, NP     Subjective:   Patient ID: Dennis Hicks, male    DOB: [birth year], 66 y.o.   MRN: [id number]  Chief Complaint  Patient presents with   Sore Throat    sx for 4 days    HPI Viral syndrome:  Pt c/o sore throat, runny nose/congestion, Cough with yellow mucus, fever of 100.9. Symptoms started Sunday. Has tried mucinex, zyrtec, tylenol and OTC Nasal spray which did help the fever some. Covid negative yesterday.    Assessment & Plan:   Problem List Items Addressed This Visit   None Visit Diagnoses     Viral upper respiratory tract infection    -  Primary pt concerned he will get PNA, last had 5 yrs ago, reports getting lung infections about 1-2x/year. Advised pt his best  prevention for PNA is a vaccine and to discuss getting the Prevnar 20 w/his PCP . He has no s/s of bacterial infection. Sending low dose pred & refill of cough syrup to help with cough, advised on use & SE, continued use of Flonase & Zyrtec (started both yesterday), ok to increase Flonase to bid for 1 week. Increase fluid intake. Call back Monday if sx are no better.    Relevant Medications   predniSONE (DELTASONE) 20 MG tablet   guaiFENesin-codeine (ROBITUSSIN AC) 100-10 MG/5ML syrup      Past Medical History:  Diagnosis Date   Actinic keratosis    Borderline diabetes    Bronchitis     COVID-19 virus infection 04/16/2020   Diabetes mellitus without complication ([place])    Headache    Heartburn    HLD (hyperlipidemia)    Hypertension    Melanoma ([place]) 01/24/2012   R lat chest, Clark's level III/early IV, Breslow's 0.54m, excised 02/20/2012   Pneumonia    x2 Aug. 2017   Psoriasis    Renal insufficiency     Past Surgical History:  Procedure Laterality Date   FINGER GANGLION CYST EXCISION     PELVIC LYMPH NODE DISSECTION Bilateral 02/19/2018   Procedure: PELVIC LYMPH NODE DISSECTION;  Surgeon: BHollice Espy MD;  Location: [facility] ORS;  Service: Urology;  Laterality: Bilateral;   ROBOT ASSISTED LAPAROSCOPIC RADICAL PROSTATECTOMY N/A 02/19/2018   Procedure: ROBOTIC ASSISTED LAPAROSCOPIC RADICAL PROSTATECTOMY;  Surgeon: BHollice Espy MD;  Location: [facility] ORS;  Service: Urology;  Laterality: N/A;    Outpatient Medications Prior to Visit  Medication Sig Dispense Refill   atorvastatin (LIPITOR) 40 MG tablet TAKE 1 TABLET DAILY 90 tablet 3   benazepril-hydrochlorthiazide (LOTENSIN HCT) 20-25 MG tablet TAKE 1 TABLET DAILY 90 tablet 0   betamethasone dipropionate 0.05 % cream Apply topically 2 (two) times daily. 30 g 0   blood glucose meter kit and supplies Dispense based on patient and insurance preference. Use up to four times daily as directed. (FOR ICD-10 E10.9, E11.9). 1 each 0   HYDROcodone-acetaminophen (  NORCO/VICODIN) 5-325 MG tablet Take 1 tablet by mouth daily as needed for moderate pain. 45 tablet 0   metFORMIN (GLUCOPHAGE-XR) 500 MG 24 hr tablet TAKE 2 TABLETS DAILY WITH BREAKFAST 180 tablet 1   SUMAtriptan (IMITREX) 100 MG tablet TAKE 1 TABLET BY MOUTH EVERY 2 HOURS AS NEEDED, MAX OF 2 TABLETS IN 24 HOURS 10 tablet 0   No facility-administered medications prior to visit.    Allergies  Allergen Reactions   Chocolate Other (See Comments)    Trigger Migraine   Ibuprofen Other (See Comments)    Kidney issues   Shrimp [Shellfish Allergy] Other (See Comments)     Trigger Migraines      Objective:   Physical Exam Vitals and nursing note reviewed.  Constitutional:      General: Pt is not in acute distress.    Appearance: Normal appearance.  HENT:     Head: Normocephalic.  Pulmonary:     Effort: No respiratory distress.  Musculoskeletal:     Cervical back: Normal range of motion.  Skin:    General: Skin is dry.     Coloration: Skin is not pale.  Neurological:     Mental Status: Pt is alert and oriented to person, place, and time.  Psychiatric:        Mood and Affect: Mood normal.   Temp 99 F (37.2 C) (Oral)   Ht 5' 0.5" (1.537 m)   Wt 177 lb (80.3 kg)   BMI 34.00 kg/m   Wt Readings from Last 3 Encounters:  12/29/21 177 lb (80.3 kg)  11/26/21 180 lb (81.6 kg)  10/22/21 179 lb 2 oz (81.3 kg)       I discussed the assessment and treatment plan with the patient. The patient was provided an opportunity to ask questions and all were answered. The patient agreed with the plan and demonstrated an understanding of the instructions.   The patient was advised to call back or seek an in-person evaluation if the symptoms worsen or if the condition fails to improve as anticipated.  Jeanie Sewer, NP [place] [phone] (phone) [phone] (fax)  [place]

## 2022-01-14 ENCOUNTER — Other Ambulatory Visit: Payer: Self-pay | Admitting: Family Medicine

## 2022-02-01 ENCOUNTER — Encounter: Payer: 59 | Admitting: Family Medicine

## 2022-04-26 ENCOUNTER — Ambulatory Visit: Payer: 59 | Admitting: Family Medicine

## 2022-05-26 ENCOUNTER — Encounter: Payer: Self-pay | Admitting: Urology

## 2022-05-27 ENCOUNTER — Other Ambulatory Visit
Admission: RE | Admit: 2022-05-27 | Discharge: 2022-05-27 | Disposition: A | Discharge status: Home / Self Care | Payer: 59 | Attending: Urology | Admitting: Urology

## 2022-05-27 DIAGNOSIS — C61 Malignant neoplasm of prostate: Secondary | ICD-10-CM | POA: Insufficient documentation

## 2022-05-27 LAB — PSA: Prostatic Specific Antigen: <0.01 ng/mL (ref 0.00–4.00)

## 2022-06-17 ENCOUNTER — Other Ambulatory Visit: Payer: Self-pay | Admitting: Family Medicine

## 2022-06-27 ENCOUNTER — Telehealth: Payer: Self-pay | Admitting: *Deleted

## 2022-06-27 ENCOUNTER — Encounter: Payer: 59 | Admitting: Dermatology

## 2022-06-27 DIAGNOSIS — E1122 Type 2 diabetes mellitus with diabetic chronic kidney disease: Secondary | ICD-10-CM

## 2022-06-27 DIAGNOSIS — E1169 Type 2 diabetes mellitus with other specified complication: Secondary | ICD-10-CM

## 2022-06-27 NOTE — Telephone Encounter (Signed)
-----   Message from Alvina Chou sent at 06/27/2022 12:32 PM EDT ----- Regarding: Lab orders for Monday, 4.15.24 Patient is scheduled for CPX labs, please order future labs, Thanks , Camelia Eng

## 2022-07-01 ENCOUNTER — Ambulatory Visit: Payer: 59 | Admitting: Family Medicine

## 2022-07-04 ENCOUNTER — Other Ambulatory Visit (INDEPENDENT_AMBULATORY_CARE_PROVIDER_SITE_OTHER): Payer: 59

## 2022-07-04 DIAGNOSIS — N1831 Chronic kidney disease, stage 3a: Secondary | ICD-10-CM

## 2022-07-04 DIAGNOSIS — E1122 Type 2 diabetes mellitus with diabetic chronic kidney disease: Secondary | ICD-10-CM

## 2022-07-04 DIAGNOSIS — E785 Hyperlipidemia, unspecified: Secondary | ICD-10-CM

## 2022-07-04 DIAGNOSIS — E1169 Type 2 diabetes mellitus with other specified complication: Secondary | ICD-10-CM

## 2022-07-04 LAB — LIPID PANEL
Cholesterol: 110 mg/dL (ref 0–200)
HDL: 34.3 mg/dL — ABNORMAL LOW (ref >39.00)
LDL Cholesterol: 51 mg/dL (ref 0–99)
NonHDL: 75.37
Total CHOL/HDL Ratio: 3
Triglycerides: 121 mg/dL (ref 0.0–149.0)
VLDL: 24.2 mg/dL (ref 0.0–40.0)

## 2022-07-04 LAB — COMPREHENSIVE METABOLIC PANEL
ALT: 44 U/L (ref 0–53)
AST: 34 U/L (ref 0–37)
Albumin: 4.4 g/dL (ref 3.5–5.2)
Alkaline Phosphatase: 56 U/L (ref 39–117)
BUN: 14 mg/dL (ref 6–23)
CO2: 31 mEq/L (ref 19–32)
Calcium: 9.3 mg/dL (ref 8.4–10.5)
Chloride: 97 mEq/L (ref 96–112)
Creatinine, Ser: 1.18 mg/dL (ref 0.40–1.50)
GFR: 64.8 mL/min (ref >60.00)
Glucose, Bld: 126 mg/dL — ABNORMAL HIGH (ref 70–99)
Potassium: 3.7 mEq/L (ref 3.5–5.1)
Sodium: 136 mEq/L (ref 135–145)
Total Bilirubin: 0.6 mg/dL (ref 0.2–1.2)
Total Protein: 6.9 g/dL (ref 6.0–8.3)

## 2022-07-04 LAB — HEMOGLOBIN A1C: Hgb A1c MFr Bld: 6.8 % — ABNORMAL HIGH (ref 4.6–6.5)

## 2022-07-04 LAB — MICROALBUMIN / CREATININE URINE RATIO
Creatinine,U: 23.6 mg/dL
Microalb Creat Ratio: 3 mg/g (ref 0.0–30.0)
Microalb, Ur: <0.7 mg/dL (ref 0.0–1.9)

## 2022-07-05 ENCOUNTER — Encounter: Payer: Self-pay | Admitting: Dermatology

## 2022-07-05 ENCOUNTER — Ambulatory Visit: Payer: 59 | Admitting: Dermatology

## 2022-07-05 VITALS — BP 121/68 | HR 78

## 2022-07-05 DIAGNOSIS — Z1283 Encounter for screening for malignant neoplasm of skin: Secondary | ICD-10-CM | POA: Diagnosis not present

## 2022-07-05 DIAGNOSIS — D2361 Other benign neoplasm of skin of right upper limb, including shoulder: Secondary | ICD-10-CM

## 2022-07-05 DIAGNOSIS — L570 Actinic keratosis: Secondary | ICD-10-CM | POA: Diagnosis not present

## 2022-07-05 DIAGNOSIS — L814 Other melanin hyperpigmentation: Secondary | ICD-10-CM

## 2022-07-05 DIAGNOSIS — L578 Other skin changes due to chronic exposure to nonionizing radiation: Secondary | ICD-10-CM | POA: Diagnosis not present

## 2022-07-05 DIAGNOSIS — D2261 Melanocytic nevi of right upper limb, including shoulder: Secondary | ICD-10-CM

## 2022-07-05 DIAGNOSIS — D239 Other benign neoplasm of skin, unspecified: Secondary | ICD-10-CM

## 2022-07-05 DIAGNOSIS — Z85820 Personal history of malignant melanoma of skin: Secondary | ICD-10-CM

## 2022-07-05 DIAGNOSIS — D225 Melanocytic nevi of trunk: Secondary | ICD-10-CM

## 2022-07-05 DIAGNOSIS — L821 Other seborrheic keratosis: Secondary | ICD-10-CM

## 2022-07-05 DIAGNOSIS — D229 Melanocytic nevi, unspecified: Secondary | ICD-10-CM

## 2022-07-05 NOTE — Patient Instructions (Addendum)
Cryotherapy Aftercare  Wash gently with soap and water everyday.   Apply Vaseline Jelly daily until healed.    Recommend daily broad spectrum sunscreen SPF 30+ to sun-exposed areas, reapply every 2 hours as needed. Call for new or changing lesions.  Staying in the shade or wearing long sleeves, sun glasses (UVA+UVB protection) and wide brim hats (4-inch brim around the entire circumference of the hat) are also recommended for sun protection.    Melanoma ABCDEs  Melanoma is the most dangerous type of skin cancer, and is the leading cause of death from skin disease.  You are more likely to develop melanoma if you: Have light-colored skin, light-colored eyes, or red or blond hair Spend a lot of time in the sun Tan regularly, either outdoors or in a tanning bed Have had blistering sunburns, especially during childhood Have a close family member who has had a melanoma Have atypical moles or large birthmarks  Early detection of melanoma is key since treatment is typically straightforward and cure rates are extremely high if we catch it early.   The first sign of melanoma is often a change in a mole or a new dark spot.  The ABCDE system is a way of remembering the signs of melanoma.  A for asymmetry:  The two halves do not match. B for border:  The edges of the growth are irregular. C for color:  A mixture of colors are present instead of an even brown color. D for diameter:  Melanomas are usually (but not always) greater than 6mm - the size of a pencil eraser. E for evolution:  The spot keeps changing in size, shape, and color.  Please check your skin once per month between visits. You can use a small mirror in front and a large mirror behind you to keep an eye on the back side or your body.   If you see any new or changing lesions before your next follow-up, please call to schedule a visit.  Please continue daily skin protection including broad spectrum sunscreen SPF 30+ to sun-exposed  areas, reapplying every 2 hours as needed when you're outdoors.   Staying in the shade or wearing long sleeves, sun glasses (UVA+UVB protection) and wide brim hats (4-inch brim around the entire circumference of the hat) are also recommended for sun protection.    Due to recent changes in healthcare laws, you may see results of your pathology and/or laboratory studies on MyChart before the doctors have had a chance to review them. We understand that in some cases there may be results that are confusing or concerning to you. Please understand that not all results are received at the same time and often the doctors may need to interpret multiple results in order to provide you with the best plan of care or course of treatment. Therefore, we ask that you please give us 2 business days to thoroughly review all your results before contacting the office for clarification. Should we see a critical lab result, you will be contacted sooner.   If You Need Anything After Your Visit  If you have any questions or concerns for your doctor, please call our main line at [phone] and press option 4 to reach your doctor's medical assistant. If no one answers, please leave a voicemail as directed and we will return your call as soon as possible. Messages left after 4 pm will be answered the following business day.   You may also send us a message via MyChart.   We typically respond to MyChart messages within 1-2 business days.  For prescription refills, please ask your pharmacy to contact our office. Our fax number is [phone].  If you have an urgent issue when the clinic is closed that cannot wait until the next business day, you can page your doctor at the number below.    Please note that while we do our best to be available for urgent issues outside of office hours, we are not available 24/7.   If you have an urgent issue and are unable to reach us, you may choose to seek medical care at your doctor's  office, retail clinic, urgent care center, or emergency room.  If you have a medical emergency, please immediately call 911 or go to the emergency department.  Pager Numbers  - Dr. Kowalski: [phone]  - Dr. Moye: [phone]  - Dr. Stewart: [phone]  In the event of inclement weather, please call our main line at [phone] for an update on the status of any delays or closures.  Dermatology Medication Tips: Please keep the boxes that topical medications come in in order to help keep track of the instructions about where and how to use these. Pharmacies typically print the medication instructions only on the boxes and not directly on the medication tubes.   If your medication is too expensive, please contact our office at [phone] option 4 or send us a message through MyChart.   We are unable to tell what your co-pay for medications will be in advance as this is different depending on your insurance coverage. However, we may be able to find a substitute medication at lower cost or fill out paperwork to get insurance to cover a needed medication.   If a prior authorization is required to get your medication covered by your insurance company, please allow us 1-2 business days to complete this process.  Drug prices often vary depending on where the prescription is filled and some pharmacies may offer cheaper prices.  The website www.goodrx.com contains coupons for medications through different pharmacies. The prices here do not account for what the cost may be with help from insurance (it may be cheaper with your insurance), but the website can give you the price if you did not use any insurance.  - You can print the associated coupon and take it with your prescription to the pharmacy.  - You may also stop by our office during regular business hours and pick up a GoodRx coupon card.  - If you need your prescription sent electronically to a different pharmacy, notify our office  through [facility] MyChart or by phone at [phone] option 4.     Si Usted Necesita Algo Despus de Su Visita  Tambin puede enviarnos un mensaje a travs de MyChart. Por lo general respondemos a los mensajes de MyChart en el transcurso de 1 a 2 das hbiles.  Para renovar recetas, por favor pida a su farmacia que se ponga en contacto con nuestra oficina. Nuestro nmero de fax es el [phone].  Si tiene un asunto urgente cuando la clnica est cerrada y que no puede esperar hasta el siguiente da hbil, puede llamar/localizar a su doctor(a) al nmero que aparece a continuacin.   Por favor, tenga en cuenta que aunque hacemos todo lo posible para estar disponibles para asuntos urgentes fuera del horario de oficina, no estamos disponibles las 24 horas del da, los 7 das de la semana.   Si tiene un problema urgente y no puede   comunicarse con nosotros, puede optar por buscar atencin mdica  en el consultorio de su doctor(a), en una clnica privada, en un centro de atencin urgente o en una sala de emergencias.  Si tiene una emergencia mdica, por favor llame inmediatamente al 911 o vaya a la sala de emergencias.  Nmeros de bper  - Dr. Kowalski: [phone]  - Dra. Moye: [phone]  - Dra. Stewart: [phone]  En caso de inclemencias del tiempo, por favor llame a nuestra lnea principal al [phone] para una actualizacin sobre el estado de cualquier retraso o cierre.  Consejos para la medicacin en dermatologa: Por favor, guarde las cajas en las que vienen los medicamentos de uso tpico para ayudarle a seguir las instrucciones sobre dnde y cmo usarlos. Las farmacias generalmente imprimen las instrucciones del medicamento slo en las cajas y no directamente en los tubos del medicamento.   Si su medicamento es muy caro, por favor, pngase en contacto con nuestra oficina llamando al [phone] y presione la opcin 4 o envenos un mensaje a travs de MyChart.   No  podemos decirle cul ser su copago por los medicamentos por adelantado ya que esto es diferente dependiendo de la cobertura de su seguro. Sin embargo, es posible que podamos encontrar un medicamento sustituto a menor costo o llenar un formulario para que el seguro cubra el medicamento que se considera necesario.   Si se requiere una autorizacin previa para que su compaa de seguros cubra su medicamento, por favor permtanos de 1 a 2 das hbiles para completar este proceso.  Los precios de los medicamentos varan con frecuencia dependiendo del lugar de dnde se surte la receta y alguna farmacias pueden ofrecer precios ms baratos.  El sitio web www.goodrx.com tiene cupones para medicamentos de diferentes farmacias. Los precios aqu no tienen en cuenta lo que podra costar con la ayuda del seguro (puede ser ms barato con su seguro), pero el sitio web puede darle el precio si no utiliz ningn seguro.  - Puede imprimir el cupn correspondiente y llevarlo con su receta a la farmacia.  - Tambin puede pasar por nuestra oficina durante el horario de atencin regular y recoger una tarjeta de cupones de GoodRx.  - Si necesita que su receta se enve electrnicamente a una farmacia diferente, informe a nuestra oficina a travs de MyChart de [facility] o por telfono llamando al [phone] y presione la opcin 4.  

## 2022-07-05 NOTE — Progress Notes (Signed)
Follow-Up Visit   Subjective  Dennis Hicks is a 67 y.o. male who presents for the following: Skin Cancer Screening and Full Body Skin Exam. Hx of Aks, HxMM.  Noticed persistent dry spot on L cheek.  The patient presents for Total-Body Skin Exam (TBSE) for skin cancer screening and mole check. The patient has spots, moles and lesions to be evaluated, some may be new or changing and the patient has concerns that these could be cancer.    The following portions of the chart were reviewed this encounter and updated as appropriate: medications, allergies, medical history  Review of Systems:  No other skin or systemic complaints except as noted in HPI or Assessment and Plan.  Objective  Well appearing patient in no apparent distress; mood and affect are within normal limits.  A full examination was performed including scalp, head, eyes, ears, nose, lips, neck, chest, axillae, abdomen, back, buttocks, bilateral upper extremities, bilateral lower extremities, hands, feet, fingers, toes, fingernails, and toenails. All findings within normal limits unless otherwise noted below.   Relevant physical exam findings are noted in the Assessment and Plan.    Assessment & Plan   HISTORY OF MELANOMA. R lat chest, Clark's level III/Early IV, Breslow's 0.66mm, excised 02/20/2012  - No evidence of recurrence today - Recommend regular full body skin exams - Recommend daily broad spectrum sunscreen SPF 30+ to sun-exposed areas, reapply every 2 hours as needed.  - Call if any new or changing lesions are noted between office visits   MELANOCYTIC NEVI Exam: Tan-brown and/or pink-flesh-colored symmetric macules and papules right medial chest 0.5 x 0.4 cm med light brown macule    right shoulder 8 mm tan speckled flesh papule    Treatment Plan: Benign-appearing. Stable compared to previous visit. Observation.  Call clinic for new or changing moles.  Recommend daily use of broad spectrum spf 30+  sunscreen to sun-exposed areas.     SEBORRHEIC KERATOSIS - Left Upper Forehead Exam: 5.0 x 3.0 mm waxy speckled brown macule - Benign-appearing - Discussed benign etiology and prognosis. - Observe - Call for any changes   LENTIGINES, SEBORRHEIC KERATOSES, HEMANGIOMAS - Benign normal skin lesions - Benign-appearing - Call for any changes  ACTINIC DAMAGE - Chronic condition, secondary to cumulative UV/sun exposure - diffuse scaly erythematous macules with underlying dyspigmentation - Recommend daily broad spectrum sunscreen SPF 30+ to sun-exposed areas, reapply every 2 hours as needed.  - Staying in the shade or wearing long sleeves, sun glasses (UVA+UVB protection) and wide brim hats (4-inch brim around the entire circumference of the hat) are also recommended for sun protection.  - Call for new or changing lesions.  DERMATOFIBROMA Exam: Firm pink/brown papulenodule with dimple sign at right upper arm. Treatment Plan: A dermatofibroma is a benign growth possibly related to trauma, such as an insect bite, cut from shaving, or inflamed acne-type bump.  Treatment options to remove include shave or excision with resulting scar and risk of recurrence.  Since not bothersome, will observe for now.    SKIN CANCER SCREENING PERFORMED TODAY.  ACTINIC KERATOSIS Exam: Erythematous thin papules/macules with gritty scale L malar cheek  Actinic keratoses are precancerous spots that appear secondary to cumulative UV radiation exposure/sun exposure over time. They are chronic with expected duration over 1 year. A portion of actinic keratoses will progress to squamous cell carcinoma of the skin. It is not possible to reliably predict which spots will progress to skin cancer and so treatment is recommended to  prevent development of skin cancer.  Recommend daily broad spectrum sunscreen SPF 30+ to sun-exposed areas, reapply every 2 hours as needed.  Recommend staying in the shade or wearing long  sleeves, sun glasses (UVA+UVB protection) and wide brim hats (4-inch brim around the entire circumference of the hat). Call for new or changing lesions.  Treatment Plan:  Prior to procedure, discussed risks of blister formation, small wound, skin dyspigmentation, or rare scar following cryotherapy. Recommend Vaseline ointment to treated areas while healing.  Destruction Procedure Note Destruction method: cryotherapy   Informed consent: discussed and consent obtained   Lesion destroyed using liquid nitrogen: Yes   Outcome: patient tolerated procedure well with no complications   Post-procedure details: wound care instructions given   Locations: left malar cheek x1 # of Lesions Treated: 1    Return in about 6 months (around 01/04/2023) for TBSE, HxMM.  I, Lawson Radar, CMA, am acting as scribe for Willeen Niece, MD.   Documentation: I have reviewed the above documentation for accuracy and completeness, and I agree with the above.  Willeen Niece, MD

## 2022-07-05 NOTE — Progress Notes (Signed)
No critical labs need to be addressed urgently. We will discuss labs in detail at upcoming office visit.   

## 2022-07-06 ENCOUNTER — Encounter: Payer: 59 | Admitting: Family Medicine

## 2022-07-06 ENCOUNTER — Ambulatory Visit (INDEPENDENT_AMBULATORY_CARE_PROVIDER_SITE_OTHER): Payer: 59 | Admitting: Family Medicine

## 2022-07-06 ENCOUNTER — Encounter: Payer: Self-pay | Admitting: Family Medicine

## 2022-07-06 VITALS — BP 108/60 | HR 65 | Temp 97.2°F | Ht 60.6 in | Wt 187.0 lb

## 2022-07-06 DIAGNOSIS — E1122 Type 2 diabetes mellitus with diabetic chronic kidney disease: Secondary | ICD-10-CM | POA: Diagnosis not present

## 2022-07-06 DIAGNOSIS — E1159 Type 2 diabetes mellitus with other circulatory complications: Secondary | ICD-10-CM

## 2022-07-06 DIAGNOSIS — M545 Low back pain, unspecified: Secondary | ICD-10-CM

## 2022-07-06 DIAGNOSIS — Z0000 Encounter for general adult medical examination without abnormal findings: Secondary | ICD-10-CM

## 2022-07-06 DIAGNOSIS — N181 Chronic kidney disease, stage 1: Secondary | ICD-10-CM

## 2022-07-06 DIAGNOSIS — E1169 Type 2 diabetes mellitus with other specified complication: Secondary | ICD-10-CM

## 2022-07-06 DIAGNOSIS — N1831 Chronic kidney disease, stage 3a: Secondary | ICD-10-CM

## 2022-07-06 DIAGNOSIS — I152 Hypertension secondary to endocrine disorders: Secondary | ICD-10-CM

## 2022-07-06 DIAGNOSIS — Z1211 Encounter for screening for malignant neoplasm of colon: Secondary | ICD-10-CM

## 2022-07-06 DIAGNOSIS — G8929 Other chronic pain: Secondary | ICD-10-CM

## 2022-07-06 DIAGNOSIS — E785 Hyperlipidemia, unspecified: Secondary | ICD-10-CM

## 2022-07-06 DIAGNOSIS — M542 Cervicalgia: Secondary | ICD-10-CM

## 2022-07-06 MED ORDER — BENAZEPRIL-HYDROCHLOROTHIAZIDE 20-25 MG PO TABS: 1.0000 | ORAL_TABLET | Freq: Every day | ORAL | 0 refills | Status: DC

## 2022-07-06 MED ORDER — HYDROCODONE-ACETAMINOPHEN 5-325 MG PO TABS: 1.0000 | ORAL_TABLET | Freq: Every day | ORAL | 0 refills | Status: AC | PRN

## 2022-07-06 NOTE — Patient Instructions (Signed)
Referral placed for colonoscopy for colon cancer screening.

## 2022-07-06 NOTE — Assessment & Plan Note (Signed)
Chronic,  good control    atorvastatin 40 mg daily.

## 2022-07-06 NOTE — Assessment & Plan Note (Signed)
  Indication for chronic opioid:  Low back pain and neck pain Medication and dose:  Hydrocodone 5/325 mg daily prn. Hydrocodone causes itching. # pills per  3 month:  rare for break through  Last UDS date:  due for repeat. Opioid Treatment Agreement signed (Y/N):  Due Opioid Treatment Agreement last reviewed with patient:    DUE [facility] reviewed this encounter (include red flags): PDMP reviewed during this encounter. Did have other prescriber in 2023 but this during the surgery.

## 2022-07-06 NOTE — Progress Notes (Signed)
Patient ID: Dennis Hicks, male    DOB: [birth year], 67 y.o.   MRN: [id number]  This visit was conducted in person.  BP 108/60 (BP Location: Left Arm, Patient Position: Sitting, Cuff Size: Normal)   Pulse 65   Temp (!) 97.2 F (36.2 C) (Temporal)   Ht 5' 0.6" (1.539 m)   Wt 187 lb (84.8 kg)   SpO2 97%   BMI 35.80 kg/m    Chief Complaint  Patient presents with   Annual Exam     Subjective:   HPI: Dennis Hicks is a 67 y.o. male presenting on 07/06/2022 for Annual Exam   Elevated Cholesterol: atorvastatin 40 mg daily.  LDL at goal less than 100. Lab Results  Component Value Date   CHOL 110 07/04/2022   HDL 34.30 (L) 07/04/2022   LDLCALC 51 07/04/2022   LDLDIRECT 158.2 01/03/2013   TRIG 121.0 07/04/2022   CHOLHDL 3 07/04/2022  Using medications without problems: none Muscle aches:  none Diet compliance: hearth healthy diet Exercise:  several days a week now has restarted. Has lost 14 lbs Wt Readings from Last 3 Encounters:  07/06/22 187 lb (84.8 kg)  12/29/21 177 lb (80.3 kg)  11/26/21 180 lb (81.6 kg)  Body mass index is 35.8 kg/m. Other complaints:  Hypertension:  Well-controlled on benazepril hydrochlorothiazide 20/25 mg p.o. daily BP Readings from Last 3 Encounters:  07/06/22 108/60  07/05/22 121/68  11/26/21 124/77  Using medication without problems or lightheadedness: none Chest pain with exertion: None Edema: None Short of breath: None Average home BPs: Other issues:  Diabetes: Excellent control on metformin 500 mg XR 2 tablets daily.  Has had some increase in alcohol and eating out when travelling. Lab Results  Component Value Date   HGBA1C 6.8 (H) 07/04/2022  Using medications without difficulties: Hypoglycemic episodes:? Hyperglycemic episodes:? Feet problems: no ulcers Blood Sugars averaging: not checking, FBS on labs 126 eye exam within last year: no  CKD, mild: GFR 64 Urine microalbumin negative Hx of prostate cancer:  Followed by urology     Indication for chronic opioid:  Low back pain and neck pain Medication and dose:  Hydrocodone 5/325 mg daily prn. Hydrocodone causes itching. # pills per  3 month:  rare for break through  Last UDS date:  due for repeat. Opioid Treatment Agreement signed (Y/N):  Due Opioid Treatment Agreement last reviewed with patient:    DUE NCCSRS reviewed this encounter (include red flags): PDMP reviewed during this encounter. Did have other prescriber in 2023 but this during the surgery.   Relevant past medical, surgical, family and social history reviewed and updated as indicated. Interim medical history since our last visit reviewed. Allergies and medications reviewed and updated. Outpatient Medications Prior to Visit  Medication Sig Dispense Refill   atorvastatin (LIPITOR) 40 MG tablet TAKE 1 TABLET DAILY 90 tablet 3   betamethasone dipropionate 0.05 % cream Apply topically 2 (two) times daily. 30 g 0   blood glucose meter kit and supplies Dispense based on patient and insurance preference. Use up to four times daily as directed. (FOR ICD-10 E10.9, E11.9). 1 each 0   metFORMIN (GLUCOPHAGE-XR) 500 MG 24 hr tablet TAKE 2 TABLETS DAILY WITH BREAKFAST 180 tablet 0   SUMAtriptan (IMITREX) 100 MG tablet TAKE 1 TABLET BY MOUTH EVERY 2 HOURS AS NEEDED, MAX OF 2 TABLETS IN 24 HOURS 10 tablet 0   benazepril-hydrochlorthiazide (LOTENSIN HCT) 20-25 MG tablet TAKE 1 TABLET DAILY 90 tablet 3  HYDROcodone-acetaminophen (NORCO/VICODIN) 5-325 MG tablet Take 1 tablet by mouth daily as needed for moderate pain. 45 tablet 0   guaiFENesin-codeine (ROBITUSSIN AC) 100-10 MG/5ML syrup Take 5 mLs by mouth 3 (three) times daily as needed for cough. (Patient not taking: Reported on 07/06/2022) 120 mL 0   predniSONE (DELTASONE) 20 MG tablet Take 2 pills in the morning with breakfast for 3 days, then 1 pill for 2 days (Patient not taking: Reported on 07/06/2022) 8 tablet 0   No facility-administered  medications prior to visit.     Per HPI unless specifically indicated in ROS section below Review of Systems  Constitutional:  Negative for fatigue and fever.  HENT:  Negative for ear pain.   Eyes:  Negative for pain.  Respiratory:  Negative for cough and shortness of breath.   Cardiovascular:  Negative for chest pain, palpitations and leg swelling.  Gastrointestinal:  Negative for abdominal pain.  Genitourinary:  Negative for dysuria.  Musculoskeletal:  Negative for arthralgias.  Neurological:  Negative for syncope, light-headedness and headaches.  Psychiatric/Behavioral:  Negative for dysphoric mood.    Objective:  BP 108/60 (BP Location: Left Arm, Patient Position: Sitting, Cuff Size: Normal)   Pulse 65   Temp (!) 97.2 F (36.2 C) (Temporal)   Ht 5' 0.6" (1.539 m)   Wt 187 lb (84.8 kg)   SpO2 97%   BMI 35.80 kg/m   Wt Readings from Last 3 Encounters:  07/06/22 187 lb (84.8 kg)  12/29/21 177 lb (80.3 kg)  11/26/21 180 lb (81.6 kg)      Physical Exam Constitutional:      General: He is not in acute distress.    Appearance: Normal appearance. He is well-developed. He is not ill-appearing or toxic-appearing.  HENT:     Head: Normocephalic and atraumatic.     Right Ear: Hearing, tympanic membrane, ear canal and external ear normal.     Left Ear: Hearing, tympanic membrane, ear canal and external ear normal.     Nose: Nose normal.     Mouth/Throat:     Pharynx: Uvula midline.  Eyes:     General: Lids are normal. Lids are everted, no foreign bodies appreciated.     Conjunctiva/sclera: Conjunctivae normal.     Pupils: Pupils are equal, round, and reactive to light.  Neck:     Thyroid: No thyroid mass or thyromegaly.     Vascular: No carotid bruit.     Trachea: Trachea and phonation normal.  Cardiovascular:     Rate and Rhythm: Normal rate and regular rhythm.     Pulses: Normal pulses.     Heart sounds: S1 normal and S2 normal. No murmur heard.    No gallop.   Pulmonary:     Breath sounds: Normal breath sounds. No wheezing, rhonchi or rales.  Abdominal:     General: Bowel sounds are normal.     Palpations: Abdomen is soft.     Tenderness: There is no abdominal tenderness. There is no guarding or rebound.     Hernia: No hernia is present.  Musculoskeletal:     Cervical back: Neck supple. Tenderness present. No bony tenderness. No pain with movement. Decreased range of motion.     Comments: Pt states swelling intermittently in neck.. not clearly palpated on my exam today.  Lymphadenopathy:     Cervical: No cervical adenopathy.  Skin:    General: Skin is warm and dry.     Findings: No rash.  Neurological:  Mental Status: He is alert.     Cranial Nerves: No cranial nerve deficit.     Sensory: No sensory deficit.     Gait: Gait normal.     Deep Tendon Reflexes: Reflexes are normal and symmetric.  Psychiatric:        Speech: Speech normal.        Behavior: Behavior normal.        Judgment: Judgment normal.       Diabetic foot exam: Normal inspection No skin breakdown No calluses  Normal DP pulses Normal sensation to light touch and monofilament Nails normal  Results for orders placed or performed in visit on 07/04/22  Microalbumin / creatinine urine ratio  Result Value Ref Range   Microalb, Ur <0.7 0.0 - 1.9 mg/dL   Creatinine,U 65.7 mg/dL   Microalb Creat Ratio 3.0 0.0 - 30.0 mg/g  Lipid panel  Result Value Ref Range   Cholesterol 110 0 - 200 mg/dL   Triglycerides 846.9 0.0 - 149.0 mg/dL   HDL 62.95 (L) >28.41 mg/dL   VLDL 32.4 0.0 - 40.1 mg/dL   LDL Cholesterol 51 0 - 99 mg/dL   Total CHOL/HDL Ratio 3    NonHDL 75.37   Hemoglobin A1c  Result Value Ref Range   Hgb A1c MFr Bld 6.8 (H) 4.6 - 6.5 %  Comprehensive metabolic panel  Result Value Ref Range   Sodium 136 135 - 145 mEq/L   Potassium 3.7 3.5 - 5.1 mEq/L   Chloride 97 96 - 112 mEq/L   CO2 31 19 - 32 mEq/L   Glucose, Bld 126 (H) 70 - 99 mg/dL   BUN 14 6 - 23  mg/dL   Creatinine, Ser 0.27 0.40 - 1.50 mg/dL   Total Bilirubin 0.6 0.2 - 1.2 mg/dL   Alkaline Phosphatase 56 39 - 117 U/L   AST 34 0 - 37 U/L   ALT 44 0 - 53 U/L   Total Protein 6.9 6.0 - 8.3 g/dL   Albumin 4.4 3.5 - 5.2 g/dL   GFR 25.36 >64.40 mL/min   Calcium 9.3 8.4 - 10.5 mg/dL     COVID 19 screen:  No recent travel or known exposure to COVID19 The patient denies respiratory symptoms of COVID 19 at this time. The importance of social distancing was discussed today.   Assessment and Plan The patient's preventative maintenance and recommended screening tests for an annual wellness exam were reviewed in full today. Brought up to date unless services declined.  Counselled on the importance of diet, exercise, and its role in overall health and mortality. The patient's FH and SH was reviewed, including their home life, tobacco status, and drug and alcohol status.    Due for PNA and shingrix.  Referred for  colonoscopy  PSA followed by Uro , prostate cancer.   Problem List Items Addressed This Visit     Chronic low back pain (Chronic)     Indication for chronic opioid:  Low back pain and neck pain Medication and dose:  Hydrocodone 5/325 mg daily prn. Hydrocodone causes itching. # pills per  3 month:  rare for break through  Last UDS date:  due for repeat. Opioid Treatment Agreement signed (Y/N):  Due Opioid Treatment Agreement last reviewed with patient:    DUE NCCSRS reviewed this encounter (include red flags): PDMP reviewed during this encounter. Did have other prescriber in 2023 but this during the surgery.      Relevant Medications   HYDROcodone-acetaminophen (NORCO/VICODIN) 5-325 MG  tablet   Other Relevant Orders   DRUG MONITORING, PANEL 8 WITH CONFIRMATION, URINE   RESOLVED: Chronic pain   Relevant Medications   HYDROcodone-acetaminophen (NORCO/VICODIN) 5-325 MG tablet   CKD stage 1 due to type 2 diabetes mellitus (HCC)    Chronic, stable At least in part due  to diabetes.        Relevant Medications   benazepril-hydrochlorthiazide (LOTENSIN HCT) 20-25 MG tablet   Encounter for chronic pain management   Relevant Orders   DRUG MONITORING, PANEL 8 WITH CONFIRMATION, URINE   Hyperlipidemia associated with type 2 diabetes mellitus (Chronic)     Chronic,  good control    atorvastatin 40 mg daily.       Relevant Medications   benazepril-hydrochlorthiazide (LOTENSIN HCT) 20-25 MG tablet   Hypertension associated with diabetes (HCC) (Chronic)    Well-controlled on benazepril hydrochlorothiazide 20/25 mg p.o. daily      Relevant Medications   benazepril-hydrochlorthiazide (LOTENSIN HCT) 20-25 MG tablet   Type 2 diabetes mellitus with renal complication (Chronic)    Excellent control on metformin 500 mg XR 2 tablets daily.         Relevant Medications   benazepril-hydrochlorthiazide (LOTENSIN HCT) 20-25 MG tablet   Other Visit Diagnoses     Routine general medical examination at a health care facility    -  Primary   Colon cancer screening       Relevant Orders   Ambulatory referral to Gastroenterology      Kerby Nora, MD

## 2022-07-06 NOTE — Assessment & Plan Note (Signed)
Chronic, stable At least in part due to diabetes.

## 2022-07-06 NOTE — Assessment & Plan Note (Signed)
Excellent control on metformin 500 mg XR 2 tablets daily.

## 2022-07-06 NOTE — Assessment & Plan Note (Signed)
Well-controlled on benazepril hydrochlorothiazide 20/25 mg p.o. daily 

## 2022-07-07 LAB — DRUG MONITORING, PANEL 8 WITH CONFIRMATION, URINE
6 Acetylmorphine: NEGATIVE ng/mL (ref <10)
Alcohol Metabolites: NEGATIVE ng/mL (ref <500)
Amphetamines: NEGATIVE ng/mL (ref <500)
Benzodiazepines: NEGATIVE ng/mL (ref <100)
Buprenorphine, Urine: NEGATIVE ng/mL (ref <5)
Cocaine Metabolite: NEGATIVE ng/mL (ref <150)
Creatinine: 29.7 mg/dL (ref >=20.0)
MDMA: NEGATIVE ng/mL (ref <500)
Marijuana Metabolite: NEGATIVE ng/mL (ref <20)
Opiates: NEGATIVE ng/mL (ref <100)
Oxidant: NEGATIVE ug/mL (ref <200)
Oxycodone: NEGATIVE ng/mL (ref <100)
pH: 6.8 (ref 4.5–9.0)

## 2022-07-07 LAB — DM TEMPLATE

## 2022-07-15 ENCOUNTER — Encounter: Payer: Self-pay | Admitting: *Deleted

## 2022-09-15 ENCOUNTER — Other Ambulatory Visit: Payer: Self-pay | Admitting: Family Medicine

## 2022-11-15 ENCOUNTER — Encounter: Payer: Self-pay | Admitting: *Deleted

## 2022-11-15 ENCOUNTER — Ambulatory Visit (INDEPENDENT_AMBULATORY_CARE_PROVIDER_SITE_OTHER): Payer: Medicare Other | Admitting: Family Medicine

## 2022-11-15 ENCOUNTER — Encounter: Payer: Self-pay | Admitting: Family Medicine

## 2022-11-15 VITALS — BP 110/60 | HR 82 | Temp 98.5°F | Ht 60.5 in | Wt 185.1 lb

## 2022-11-15 DIAGNOSIS — E1159 Type 2 diabetes mellitus with other circulatory complications: Secondary | ICD-10-CM

## 2022-11-15 DIAGNOSIS — R051 Acute cough: Secondary | ICD-10-CM | POA: Diagnosis not present

## 2022-11-15 DIAGNOSIS — I152 Hypertension secondary to endocrine disorders: Secondary | ICD-10-CM

## 2022-11-15 MED ORDER — PREDNISONE 10 MG PO TABS: ORAL_TABLET | ORAL | 0 refills | Status: DC

## 2022-11-15 MED ORDER — AZITHROMYCIN 250 MG PO TABS: ORAL_TABLET | ORAL | 0 refills | Status: DC

## 2022-11-15 MED ORDER — GUAIFENESIN-CODEINE 100-10 MG/5ML PO SYRP: 5.0000 mL | ORAL_SOLUTION | Freq: Every evening | ORAL | 0 refills | Status: DC | PRN

## 2022-11-15 NOTE — Assessment & Plan Note (Signed)
Acute, ongoing greater than 2 weeks and increased likelihood of bacterial superinfection on top of likely allergies.  Can use Xyzal at bedtime for seasonal allergies. Will treat with azithromycin to cover for bacterial infection.  Use prednisone taper to decrease bronchospasm.  Can use codeine/guaifenesin cough suppressant at night as needed.  Return and ER precautions provided

## 2022-11-15 NOTE — Assessment & Plan Note (Signed)
Chronic, improved control and dropping low diastolic following weight loss and retirement. He will decrease benazepril/HCTZ 20/25 mg daily to half a tablet daily.  He will follow blood pressure at home over the next few weeks.

## 2022-11-15 NOTE — Progress Notes (Signed)
Patient ID: Dennis Hicks, male    DOB: 02/24/1956, 67 y.o.   MRN: [id number]  This visit was conducted in person.  BP 110/60 (BP Location: Left Arm, Patient Position: Sitting, Cuff Size: Large)   Pulse 82   Temp 98.5 F (36.9 C) (Temporal)   Ht 5' 0.5" (1.537 m)   Wt 185 lb 2 oz (84 kg)   SpO2 98%   BMI 35.56 kg/m    CC:  Chief Complaint  Patient presents with   Cough    X 2 weeks-with green phelgm-States he can't get cough to break up in his chest    Subjective:   HPI: Dennis Hicks is a 67 y.o. male presenting on 11/15/2022 for Cough (X 2 weeks-with green phelgm-States he can't get cough to break up in his chest)   Date of onset: 2 week ago Initial symptoms included  chest  congestion, no ST, no ear pain  Started after yard work, trimming tree/ grass Symptoms progressed to low grade, chest congestion, frequent cough,productive green mucus. Hears rattling in chest.  No fatigue.  No SOB. No wheezing.   last night did note small amount of  pink in mucus. Sick contacts:  none COVID testing:   none     He has tried to treat with  Mucinex DM BID, flonase  Nasal flush.      No history of chronic lung disease such as asthma or COPD.  History of DM. Non-smoker.   He has noted BP has been running low on benazapril/hydrochlorothiazide No lighthededness     Relevant past medical, surgical, family and social history reviewed and updated as indicated. Interim medical history since our last visit reviewed. Allergies and medications reviewed and updated. Outpatient Medications Prior to Visit  Medication Sig Dispense Refill   atorvastatin (LIPITOR) 40 MG tablet TAKE 1 TABLET DAILY 90 tablet 3   benazepril-hydrochlorthiazide (LOTENSIN HCT) 20-25 MG tablet Take 1 tablet by mouth daily. 10 tablet 0   betamethasone dipropionate 0.05 % cream Apply topically 2 (two) times daily. 30 g 0   blood glucose meter kit and supplies Dispense based on patient and insurance  preference. Use up to four times daily as directed. (FOR ICD-10 E10.9, E11.9). 1 each 0   HYDROcodone-acetaminophen (NORCO/VICODIN) 5-325 MG tablet Take 1 tablet by mouth daily as needed for moderate pain. 45 tablet 0   metFORMIN (GLUCOPHAGE-XR) 500 MG 24 hr tablet TAKE 2 TABLETS DAILY WITH BREAKFAST 180 tablet 1   SUMAtriptan (IMITREX) 100 MG tablet TAKE 1 TABLET BY MOUTH EVERY 2 HOURS AS NEEDED, MAX OF 2 TABLETS IN 24 HOURS 10 tablet 0   No facility-administered medications prior to visit.     Per HPI unless specifically indicated in ROS section below Review of Systems  Constitutional:  Negative for fatigue and fever.  HENT:  Negative for ear pain.   Eyes:  Negative for pain.  Respiratory:  Negative for cough and shortness of breath.   Cardiovascular:  Negative for chest pain, palpitations and leg swelling.  Gastrointestinal:  Negative for abdominal pain.  Genitourinary:  Negative for dysuria.  Musculoskeletal:  Negative for arthralgias.  Neurological:  Negative for syncope, light-headedness and headaches.  Psychiatric/Behavioral:  Negative for dysphoric mood.    Objective:  BP 110/60 (BP Location: Left Arm, Patient Position: Sitting, Cuff Size: Large)   Pulse 82   Temp 98.5 F (36.9 C) (Temporal)   Ht 5' 0.5" (1.537 m)   Wt 185 lb 2 oz (  84 kg)   SpO2 98%   BMI 35.56 kg/m   Wt Readings from Last 3 Encounters:  11/15/22 185 lb 2 oz (84 kg)  07/06/22 187 lb (84.8 kg)  12/29/21 177 lb (80.3 kg)      Physical Exam Constitutional:      Appearance: He is well-developed.  HENT:     Head: Normocephalic.     Right Ear: Hearing normal.     Left Ear: Hearing normal.     Nose: Nose normal.  Neck:     Thyroid: No thyroid mass or thyromegaly.     Vascular: No carotid bruit.     Trachea: Trachea normal.  Cardiovascular:     Rate and Rhythm: Normal rate and regular rhythm.     Pulses: Normal pulses.     Heart sounds: Heart sounds not distant. No murmur heard.    No friction  rub. No gallop.     Comments: No peripheral edema Pulmonary:     Effort: Pulmonary effort is normal. No respiratory distress.     Breath sounds: Rhonchi present.  Skin:    General: Skin is warm and dry.     Findings: No rash.  Psychiatric:        Speech: Speech normal.        Behavior: Behavior normal.        Thought Content: Thought content normal.       Results for orders placed or performed in visit on 07/06/22  DRUG MONITORING, PANEL 8 WITH CONFIRMATION, URINE  Result Value Ref Range   Alcohol Metabolites NEGATIVE <500 ng/mL   Amphetamines NEGATIVE <500 ng/mL   Benzodiazepines NEGATIVE <100 ng/mL   Buprenorphine, Urine NEGATIVE <5 ng/mL   Cocaine Metabolite NEGATIVE <150 ng/mL   6 Acetylmorphine NEGATIVE <10 ng/mL   Marijuana Metabolite NEGATIVE <20 ng/mL   MDMA NEGATIVE <500 ng/mL   Opiates NEGATIVE <100 ng/mL   Oxycodone NEGATIVE <100 ng/mL   Creatinine 29.7 > or = 20.0 mg/dL   pH 6.8 4.5 - 9.0   Oxidant NEGATIVE <200 mcg/mL  DM TEMPLATE  Result Value Ref Range   Notes and Comments      Assessment and Plan  Acute cough Assessment & Plan: Acute, ongoing greater than 2 weeks and increased likelihood of bacterial superinfection on top of likely allergies.  Can use Xyzal at bedtime for seasonal allergies. Will treat with azithromycin to cover for bacterial infection.  Use prednisone taper to decrease bronchospasm.  Can use codeine/guaifenesin cough suppressant at night as needed.  Return and ER precautions provided   Hypertension associated with diabetes [facility]) Assessment & Plan: Chronic, improved control and dropping low diastolic following weight loss and retirement. He will decrease benazepril/HCTZ 20/25 mg daily to half a tablet daily.  He will follow blood pressure at home over the next few weeks.   Other orders -     predniSONE; 3 tabs by mouth daily x 3 days, then 2 tabs by mouth daily x 2 days then 1 tab by mouth daily x 2 days  Dispense: 15 tablet;  Refill: 0 -     Azithromycin; 2 tab po x 1 day then 1 tab po daily  Dispense: 6 tablet; Refill: 0 -     guaiFENesin-Codeine; Take 5-10 mLs by mouth at bedtime as needed for cough.  Dispense: 180 mL; Refill: 0    No follow-ups on file.   Kerby Nora, MD

## 2022-11-15 NOTE — Patient Instructions (Addendum)
Try trial of 1/2 tablet of benazepril/hydrochlorothiazide daily for BP... goal < 140/90.  Complete antibiotics, prednisone taper and use cough suppressant at bedtime. Go to ER if severe shortness of breath.  Contact me if you are not improving as expected.

## 2022-11-22 ENCOUNTER — Telehealth: Payer: Self-pay | Admitting: Family Medicine

## 2022-11-22 ENCOUNTER — Encounter: Payer: Self-pay | Admitting: Family Medicine

## 2022-11-22 ENCOUNTER — Encounter: Payer: Self-pay | Admitting: Urology

## 2022-11-22 NOTE — Telephone Encounter (Signed)
Mr. Omori notified as instructed by telephone.  Patient states understanding.

## 2022-11-22 NOTE — Telephone Encounter (Signed)
Patient was seen on 11/15/2022 and diagnosed with an upper respiratory infection.He was prescribed  azithromycin (ZITHROMAX) 250 MG tablet   guaiFENesin-codeine (ROBITUSSIN AC) 100-10 MG/5ML syrup   predniSONE (DELTASONE) 10 MG tablet   He called in today stating that he has finished the medications,and was feeling better,but all of a sudden he now is having ear pain,head congestion.He would like to know is there something he can possibly take to help with these new symptoms? Please advise. I offered an appointment however he just wanted advice to what he can take to help.

## 2022-11-22 NOTE — Telephone Encounter (Signed)
Information added from Access nurse call below:

## 2022-11-22 NOTE — Telephone Encounter (Signed)
error 

## 2022-11-23 ENCOUNTER — Other Ambulatory Visit
Admission: RE | Admit: 2022-11-23 | Discharge: 2022-11-23 | Disposition: A | Discharge status: Home / Self Care | Payer: Medicare Other | Attending: Urology | Admitting: Urology

## 2022-11-23 ENCOUNTER — Encounter: Payer: Self-pay | Admitting: *Deleted

## 2022-11-23 DIAGNOSIS — C61 Malignant neoplasm of prostate: Secondary | ICD-10-CM | POA: Diagnosis present

## 2022-11-23 LAB — PSA: Prostatic Specific Antigen: <0.01 ng/mL (ref 0.00–4.00)

## 2022-11-24 ENCOUNTER — Encounter: Payer: Self-pay | Admitting: Family Medicine

## 2022-11-25 ENCOUNTER — Other Ambulatory Visit: Payer: Self-pay | Admitting: Family Medicine

## 2022-11-25 ENCOUNTER — Ambulatory Visit (INDEPENDENT_AMBULATORY_CARE_PROVIDER_SITE_OTHER): Payer: Medicare Other | Admitting: Urology

## 2022-11-25 VITALS — BP 120/74 | HR 86 | Ht 67.0 in | Wt 183.0 lb

## 2022-11-25 DIAGNOSIS — N3040 Irradiation cystitis without hematuria: Secondary | ICD-10-CM

## 2022-11-25 DIAGNOSIS — N393 Stress incontinence (female) (male): Secondary | ICD-10-CM

## 2022-11-25 DIAGNOSIS — Z8546 Personal history of malignant neoplasm of prostate: Secondary | ICD-10-CM | POA: Diagnosis not present

## 2022-11-25 DIAGNOSIS — N529 Male erectile dysfunction, unspecified: Secondary | ICD-10-CM

## 2022-11-25 DIAGNOSIS — C61 Malignant neoplasm of prostate: Secondary | ICD-10-CM

## 2022-11-25 DIAGNOSIS — Z87448 Personal history of other diseases of urinary system: Secondary | ICD-10-CM | POA: Diagnosis not present

## 2022-11-25 MED ORDER — AZITHROMYCIN 250 MG PO TABS: ORAL_TABLET | ORAL | 0 refills | Status: DC

## 2022-11-25 NOTE — Progress Notes (Signed)
Marcelle Overlie Plume,acting as a scribe for Vanna Scotland, MD.,have documented all relevant documentation on the behalf of Vanna Scotland, MD,as directed by  Vanna Scotland, MD while in the presence of Vanna Scotland, MD.  11/25/2022 9:33 AM   Dennis Hicks [birth year] [id number]  Referring provider: Excell Seltzer, MD [address] [place],  [place] [address]  Chief Complaint  Patient presents with   Prostate Cancer    HPI: 67 year old male with high-risk prostate cancer returns today for routine annual follow-up.   Surgical pathology consistent with high risk Gleason 4+5 prostate cancer with invasion of the bladder neck, extraprostatic extension, negative margins, negative lymph nodes, no seminal vesicle involvement.  pT3a N0 MX.  Adjuvant XRT completed 01/2019.   PSA remains undetectable as of 11/23/2022.  He has severe urinary incontinence and underwent artificial urinary sphincter with Dr. Guy Sandifer. Unfortunately, this is complicated by an erosion and explant in 08/2021. His most recent cystoscopy in 12/2021 showed that his urethra had healed and he was offered a redo. He also reports erectile dysfunction. He is also interested in IPP. He has deferred this due to work obligations and is considering early next year per Dr. Ericka Pontiff note.   He also has a personal history of radiation cystitis where he was treated with hyperbaric oxygen. This is resolved.   Today he reports that he is retired and enjoying it.  He is really anxious/hesitant about whether or not to pursue second attempted artificial urinary sphincter placement.  If he does, he might consider a different institution i.e. [facility].  He was using trimix but not on a regular basis.  He has been using an incontinence device which wraps around his penis, had some trouble finding it recently and is worried about this.   PMH: Past Medical History:  Diagnosis Date   Actinic keratosis    Borderline diabetes    Bronchitis     COVID-19 virus infection 04/16/2020   Diabetes mellitus without complication (HCC)    Headache    Heartburn    HLD (hyperlipidemia)    Hypertension    Melanoma (HCC) 01/24/2012   R lat chest, Clark's level III/early IV, Breslow's 0.70mm, excised 02/20/2012   Pneumonia    x2 Aug. 2017   Psoriasis    Renal insufficiency     Surgical History: Past Surgical History:  Procedure Laterality Date   FINGER GANGLION CYST EXCISION     PELVIC LYMPH NODE DISSECTION Bilateral 02/19/2018   Procedure: PELVIC LYMPH NODE DISSECTION;  Surgeon: Vanna Scotland, MD;  Location: [facility] ORS;  Service: Urology;  Laterality: Bilateral;   ROBOT ASSISTED LAPAROSCOPIC RADICAL PROSTATECTOMY N/A 02/19/2018   Procedure: ROBOTIC ASSISTED LAPAROSCOPIC RADICAL PROSTATECTOMY;  Surgeon: Vanna Scotland, MD;  Location: [facility] ORS;  Service: Urology;  Laterality: N/A;    Home Medications:  Allergies as of 11/25/2022       Reactions   Chocolate Other (See Comments)   Trigger Migraine   Ibuprofen Other (See Comments)   Kidney issues   Shrimp [shellfish Allergy] Other (See Comments)   Trigger Migraines        Medication List        Accurate as of November 25, 2022  9:33 AM. If you have any questions, ask your nurse or doctor.          STOP taking these medications    azithromycin 250 MG tablet Commonly known as: ZITHROMAX   predniSONE 10 MG tablet Commonly known as: DELTASONE  TAKE these medications    atorvastatin 40 MG tablet Commonly known as: LIPITOR TAKE 1 TABLET DAILY   benazepril-hydrochlorthiazide 20-25 MG tablet Commonly known as: LOTENSIN HCT Take 1 tablet by mouth daily.   betamethasone dipropionate 0.05 % cream Apply topically 2 (two) times daily.   blood glucose meter kit and supplies Dispense based on patient and insurance preference. Use up to four times daily as directed. (FOR ICD-10 E10.9, E11.9).   guaiFENesin-codeine 100-10 MG/5ML syrup Commonly known as: ROBITUSSIN  AC Take 5-10 mLs by mouth at bedtime as needed for cough.   HYDROcodone-acetaminophen 5-325 MG tablet Commonly known as: NORCO/VICODIN Take 1 tablet by mouth daily as needed for moderate pain.   metFORMIN 500 MG 24 hr tablet Commonly known as: GLUCOPHAGE-XR TAKE 2 TABLETS DAILY WITH BREAKFAST   SUMAtriptan 100 MG tablet Commonly known as: IMITREX TAKE 1 TABLET BY MOUTH EVERY 2 HOURS AS NEEDED, MAX OF 2 TABLETS IN 24 HOURS        Allergies:  Allergies  Allergen Reactions   Chocolate Other (See Comments)    Trigger Migraine   Ibuprofen Other (See Comments)    Kidney issues   Shrimp [Shellfish Allergy] Other (See Comments)    Trigger Migraines    Family History: Family History  Problem Relation Age of Onset   Hypertension Mother    Coronary artery disease Father    Hypertension Father    Hyperlipidemia Father    Diabetes Father    Prostate cancer Neg Hx    Kidney cancer Neg Hx    Bladder Cancer Neg Hx     Social History:  reports that he has never smoked. He has never used smokeless tobacco. He reports current alcohol use. He reports that he does not currently use drugs.   Physical Exam: BP 120/74   Pulse 86   Ht 5\' 7"  (1.702 m)   Wt 183 lb (83 kg)   BMI 28.66 kg/m   Constitutional:  Alert and oriented, No acute distress. HEENT: [place] AT, moist mucus membranes.  Trachea midline, no masses. Neurologic: Grossly intact, no focal deficits, moving all 4 extremities. Psychiatric: Normal mood and affect.   Assessment & Plan:    1. Prostate cancer - High risk, status post robotic prostatectomy, salvage radiation - PSA remains undetectable - We can transition to checking this annually, given that it has been nearly 5 years without recurrence  2. Stress incontinence - Managed at [facility] -May consider revision, would like referral to another institution, he will let us know  3. Erectile dysfunction -Previously interested in IPP, follow-up with  reconstructive urologist -Trimix as needed/desired  4. History of radiation cystitis - It does not appear that he has had any further issues  Return in about 1 year (around 11/25/2023) for repeat PSA.  I have reviewed the above documentation for accuracy and completeness, and I agree with the above.   Vanna Scotland, MD   [facility] [address] [place], [place] [address] [phone]

## 2022-11-29 ENCOUNTER — Ambulatory Visit (INDEPENDENT_AMBULATORY_CARE_PROVIDER_SITE_OTHER): Payer: Medicare Other | Admitting: Family Medicine

## 2022-11-29 VITALS — BP 110/62 | HR 87 | Temp 98.2°F | Ht 60.5 in | Wt 181.5 lb

## 2022-11-29 DIAGNOSIS — H6992 Unspecified Eustachian tube disorder, left ear: Secondary | ICD-10-CM | POA: Insufficient documentation

## 2022-11-29 DIAGNOSIS — Z9109 Other allergy status, other than to drugs and biological substances: Secondary | ICD-10-CM | POA: Diagnosis not present

## 2022-11-29 MED ORDER — PREDNISONE 20 MG PO TABS: ORAL_TABLET | ORAL | 0 refills | Status: DC

## 2022-11-29 NOTE — Assessment & Plan Note (Signed)
Acute, no clear sign of ear infection or sinus infection at this point.  Symptoms seem to be most clearly related to poorly controlled allergies and residual fluid accumulation behind the left eardrum. Can try Afrin x 3 days but if no improvement will treat with prednisone taper, higher dose than previous.    Continue nasal saline irrigation.

## 2022-11-29 NOTE — Patient Instructions (Addendum)
Start Xyzal at bedtime for allergy component.  Can use afrin for 3 days.  Can also use saline to irrigate.  If not improving, complete course of  prednisone.

## 2022-11-29 NOTE — Progress Notes (Signed)
Patient ID: Dennis Hicks, male    DOB: 04-Oct-1955, 67 y.o.   MRN: [id number]  This visit was conducted in person.  BP 110/62 (BP Location: Left Arm, Patient Position: Sitting, Cuff Size: Large)   Pulse 87   Temp 98.2 F (36.8 C) (Temporal)   Ht 5' 0.5" (1.537 m)   Wt 181 lb 8 oz (82.3 kg)   SpO2 97%   BMI 34.86 kg/m    CC:  Chief Complaint  Patient presents with   Ear Fullness    Left    Subjective:   HPI: Dennis Hicks is a 67 y.o. male presenting on 11/29/2022 for Ear Fullness (Left)  Treated on 8/27 for sinus infection/bronchitis with Azithromycin 250 MG 2 tab po x 1 day then 1 tab po daily guaiFENesin-Codeine 100-10 MG/5ML 5-10 mLs Oral At bedtime PRN predniSONE 10 MG 3 tabs by mouth daily x 3 days, then 2 tabs by mouth daily x 2 days then 1 tab by mouth daily x 2 days      Initially had improvement with  azithromycin.. felt better, still some sough.  Few days later nasal congestion increased, left ear pain, echoing. Second course  of azithromycin did not help at all. Ears remained full.. tried course of flonase , afrin with no relief.  Has put ETOH/peroxide in ear. Decongestants contraindicated  given HTN. Low grade temperature yesterday 62F. Felt flushed.  No SOB, no wheeze.. drainage in throat from head to chest.    Relevant past medical, surgical, family and social history reviewed and updated as indicated. Interim medical history since our last visit reviewed. Allergies and medications reviewed and updated. Outpatient Medications Prior to Visit  Medication Sig Dispense Refill   atorvastatin (LIPITOR) 40 MG tablet TAKE 1 TABLET DAILY 90 tablet 3   benazepril-hydrochlorthiazide (LOTENSIN HCT) 20-25 MG tablet Take 1 tablet by mouth daily. 10 tablet 0   betamethasone dipropionate 0.05 % cream Apply topically 2 (two) times daily. 30 g 0   blood glucose meter kit and supplies Dispense based on patient and insurance preference. Use up to four times  daily as directed. (FOR ICD-10 E10.9, E11.9). 1 each 0   guaiFENesin-codeine (ROBITUSSIN AC) 100-10 MG/5ML syrup Take 5-10 mLs by mouth at bedtime as needed for cough. 180 mL 0   HYDROcodone-acetaminophen (NORCO/VICODIN) 5-325 MG tablet Take 1 tablet by mouth daily as needed for moderate pain. 45 tablet 0   metFORMIN (GLUCOPHAGE-XR) 500 MG 24 hr tablet TAKE 2 TABLETS DAILY WITH BREAKFAST 180 tablet 1   SUMAtriptan (IMITREX) 100 MG tablet TAKE 1 TABLET BY MOUTH EVERY 2 HOURS AS NEEDED, MAX OF 2 TABLETS IN 24 HOURS 10 tablet 0   azithromycin (ZITHROMAX) 250 MG tablet 2 tab po x 1 day then 1 tab po daily 6 tablet 0   No facility-administered medications prior to visit.     Per HPI unless specifically indicated in ROS section below Review of Systems  Constitutional:  Negative for fatigue and fever.  HENT:  Negative for ear pain.   Eyes:  Negative for pain.  Respiratory:  Negative for cough and shortness of breath.   Cardiovascular:  Negative for chest pain, palpitations and leg swelling.  Gastrointestinal:  Negative for abdominal pain.  Genitourinary:  Negative for dysuria.  Musculoskeletal:  Negative for arthralgias.  Neurological:  Negative for syncope, light-headedness and headaches.  Psychiatric/Behavioral:  Negative for dysphoric mood.    Objective:  BP 110/62 (BP Location: Left Arm, Patient Position: Sitting,  Cuff Size: Large)   Pulse 87   Temp 98.2 F (36.8 C) (Temporal)   Ht 5' 0.5" (1.537 m)   Wt 181 lb 8 oz (82.3 kg)   SpO2 97%   BMI 34.86 kg/m   Wt Readings from Last 3 Encounters:  11/29/22 181 lb 8 oz (82.3 kg)  11/25/22 183 lb (83 kg)  11/15/22 185 lb 2 oz (84 kg)      Physical Exam Vitals reviewed.  Constitutional:      Appearance: He is well-developed.  HENT:     Head: Normocephalic.     Right Ear: Hearing normal.     Left Ear: Hearing normal. A middle ear effusion is present.     Nose: Nose normal.     Mouth/Throat:     Mouth: Oropharynx is clear and moist  and mucous membranes are normal.  Neck:     Thyroid: No thyroid mass or thyromegaly.     Vascular: No carotid bruit.     Trachea: Trachea normal.  Cardiovascular:     Rate and Rhythm: Normal rate and regular rhythm.     Pulses: Normal pulses.     Heart sounds: Heart sounds not distant. No murmur heard.    No friction rub. No gallop.     Comments: No peripheral edema Pulmonary:     Effort: Pulmonary effort is normal. No respiratory distress.     Breath sounds: Normal breath sounds.  Skin:    General: Skin is warm, dry and intact.     Findings: No rash.  Psychiatric:        Mood and Affect: Mood and affect normal.        Speech: Speech normal.        Behavior: Behavior normal.        Thought Content: Thought content normal.       Results for orders placed or performed during the hospital encounter of 11/23/22  PSA  Result Value Ref Range   Prostatic Specific Antigen <0.01 0.00 - 4.00 ng/mL    Assessment and Plan  Dysfunction of left eustachian tube Assessment & Plan: Acute, no clear sign of ear infection or sinus infection at this point.  Symptoms seem to be most clearly related to poorly controlled allergies and residual fluid accumulation behind the left eardrum. Can try Afrin x 3 days but if no improvement will treat with prednisone taper, higher dose than previous.    Continue nasal saline irrigation.    Environmental allergies Assessment & Plan: Chronic, recurrent seasonally in summer/fall  Recommended starting Xyzal nightly at bedtime.   Other orders -     predniSONE; 3 tabs by mouth daily x 3 days, then 2 tabs by mouth daily x 2 days then 1 tab by mouth daily x 2 days  Dispense: 15 tablet; Refill: 0    No follow-ups on file.   Kerby Nora, MD

## 2022-11-29 NOTE — Assessment & Plan Note (Signed)
Chronic, recurrent seasonally in summer/fall  Recommended starting Xyzal nightly at bedtime.

## 2022-12-01 ENCOUNTER — Encounter: Payer: Self-pay | Admitting: Urology

## 2022-12-05 ENCOUNTER — Other Ambulatory Visit: Payer: Self-pay | Admitting: Family Medicine

## 2022-12-19 ENCOUNTER — Other Ambulatory Visit: Payer: Self-pay | Admitting: Family Medicine

## 2022-12-27 ENCOUNTER — Ambulatory Visit: Payer: 59 | Admitting: Dermatology

## 2023-01-11 ENCOUNTER — Ambulatory Visit (INDEPENDENT_AMBULATORY_CARE_PROVIDER_SITE_OTHER): Payer: Medicare Other | Admitting: Dermatology

## 2023-01-11 DIAGNOSIS — D225 Melanocytic nevi of trunk: Secondary | ICD-10-CM

## 2023-01-11 DIAGNOSIS — W908XXA Exposure to other nonionizing radiation, initial encounter: Secondary | ICD-10-CM | POA: Diagnosis not present

## 2023-01-11 DIAGNOSIS — L570 Actinic keratosis: Secondary | ICD-10-CM | POA: Diagnosis not present

## 2023-01-11 DIAGNOSIS — D2261 Melanocytic nevi of right upper limb, including shoulder: Secondary | ICD-10-CM

## 2023-01-11 DIAGNOSIS — Z1283 Encounter for screening for malignant neoplasm of skin: Secondary | ICD-10-CM | POA: Diagnosis not present

## 2023-01-11 DIAGNOSIS — Z85820 Personal history of malignant melanoma of skin: Secondary | ICD-10-CM

## 2023-01-11 DIAGNOSIS — L821 Other seborrheic keratosis: Secondary | ICD-10-CM

## 2023-01-11 DIAGNOSIS — L578 Other skin changes due to chronic exposure to nonionizing radiation: Secondary | ICD-10-CM | POA: Diagnosis not present

## 2023-01-11 DIAGNOSIS — D229 Melanocytic nevi, unspecified: Secondary | ICD-10-CM

## 2023-01-11 DIAGNOSIS — D1801 Hemangioma of skin and subcutaneous tissue: Secondary | ICD-10-CM

## 2023-01-11 DIAGNOSIS — L814 Other melanin hyperpigmentation: Secondary | ICD-10-CM

## 2023-01-11 NOTE — Progress Notes (Signed)
Follow-Up Visit   Subjective  Dennis Hicks is a 67 y.o. male who presents for the following: Skin Cancer Screening and Full Body Skin Exam  The patient presents for 6 month Total-Body Skin Exam (TBSE) for skin cancer screening and mole check. The patient has spots, moles and lesions to be evaluated, some may be new or changing. Patient has been treating spots on face with bee venom. History of melanoma of the right lateral chest, WLE 02/20/12.    The following portions of the chart were reviewed this encounter and updated as appropriate: medications, allergies, medical history  Review of Systems:  No other skin or systemic complaints except as noted in HPI or Assessment and Plan.  Objective  Well appearing patient in no apparent distress; mood and affect are within normal limits.  A full examination was performed including scalp, head, eyes, ears, nose, lips, neck, chest, axillae, abdomen, back, buttocks, bilateral upper extremities, bilateral lower extremities, hands, feet, fingers, toes, fingernails, and toenails. All findings within normal limits unless otherwise noted below.   Relevant physical exam findings are noted in the Assessment and Plan.  R temple, L forehead, L nasal dorsum Pink scaly macules.    Assessment & Plan   SKIN CANCER SCREENING PERFORMED TODAY.  ACTINIC DAMAGE - Chronic condition, secondary to cumulative UV/sun exposure - diffuse scaly erythematous macules with underlying dyspigmentation - Recommend daily broad spectrum sunscreen SPF 30+ to sun-exposed areas, reapply every 2 hours as needed.  - Staying in the shade or wearing long sleeves, sun glasses (UVA+UVB protection) and wide brim hats (4-inch brim around the entire circumference of the hat) are also recommended for sun protection.  - Call for new or changing lesions.  LENTIGINES, SEBORRHEIC KERATOSES, HEMANGIOMAS - Benign normal skin lesions - Benign-appearing - Call for any  changes  MELANOCYTIC NEVI - Tan-brown and/or pink-flesh-colored symmetric macules and papules - right medial chest 0.5 x 0.4 cm med light brown macule  -right shoulder 8 mm tan speckled flesh papule  - Benign appearing on exam today - Observation - Call clinic for new or changing moles - Recommend daily use of broad spectrum spf 30+ sunscreen to sun-exposed areas.   HISTORY OF MELANOMA. R lat chest, Clark's level III/Early IV, Breslow's 0.42mm, excised 02/20/2012  - No evidence of recurrence today - Recommend regular full body skin exams - Recommend daily broad spectrum sunscreen SPF 30+ to sun-exposed areas, reapply every 2 hours as needed.  - Call if any new or changing lesions are noted between office vis  SEBORRHEIC KERATOSIS - 1.42mm right glabella waxy brown macule, lighter center - Benign-appearing - Discussed benign etiology and prognosis. - Observe - Call for any changes  AK (actinic keratosis) R temple, L forehead, L nasal dorsum  No treatment today. Patient will try to treat with bee venom. Recheck on follow-up.   Actinic keratoses are precancerous spots that appear secondary to cumulative UV radiation exposure/sun exposure over time. They are chronic with expected duration over 1 year. A portion of actinic keratoses will progress to squamous cell carcinoma of the skin. It is not possible to reliably predict which spots will progress to skin cancer and so treatment is recommended to prevent development of skin cancer.  Recommend daily broad spectrum sunscreen SPF 30+ to sun-exposed areas, reapply every 2 hours as needed.  Recommend staying in the shade or wearing long sleeves, sun glasses (UVA+UVB protection) and wide brim hats (4-inch brim around the entire circumference of the hat). Call  for new or changing lesions.   Return in about 6 months (around 07/12/2023) for TBSE, Hx melanoma.  ICherlyn Labella, CMA, am acting as scribe for Willeen Niece, MD  .   Documentation: I have reviewed the above documentation for accuracy and completeness, and I agree with the above.  Willeen Niece, MD

## 2023-01-11 NOTE — Patient Instructions (Signed)

## 2023-03-17 ENCOUNTER — Other Ambulatory Visit: Payer: Self-pay | Admitting: Family Medicine

## 2023-04-10 ENCOUNTER — Encounter: Payer: Self-pay | Admitting: Urology

## 2023-04-10 ENCOUNTER — Encounter: Payer: Self-pay | Admitting: Family Medicine

## 2023-04-20 ENCOUNTER — Other Ambulatory Visit: Payer: Self-pay | Admitting: Family Medicine

## 2023-04-20 ENCOUNTER — Telehealth: Payer: Self-pay | Admitting: *Deleted

## 2023-04-20 MED ORDER — OSELTAMIVIR PHOSPHATE 75 MG PO CAPS: 75.0000 mg | ORAL_CAPSULE | Freq: Every day | ORAL | 0 refills | Status: DC

## 2023-04-20 NOTE — Telephone Encounter (Signed)
Copied from CRM [id number]. Topic: Clinical - Medication Question >> Apr 20, 2023 11:25 AM Fredrich Romans wrote: Reason for CRM: patient would like to know if he could have a prescription for tamiflu.He stated that his wife has been out of town and has came down with the flu,and she is coming home today and would like to take precautions.  [id number] - [place], [place] - [address] AT [facility] [address] [facility] [address]  Phone: [phone] Fax: [phone]

## 2023-04-20 NOTE — Telephone Encounter (Signed)
Please sign pending Rx below if okay to send in.

## 2023-04-20 NOTE — Telephone Encounter (Signed)
Let patient know the prescription has been sent.

## 2023-04-20 NOTE — Telephone Encounter (Signed)
Dennis Hicks notified by telephone that Dr. Ermalene Searing sent him in the prescription for Tamiflu as requested.

## 2023-04-21 ENCOUNTER — Telehealth: Payer: Self-pay | Admitting: Urology

## 2023-04-21 NOTE — Telephone Encounter (Signed)
Patient rescheduled his one year follow up appt from 11/24/23 to 06/02/23. He will be relocating to [place], [place] and wanted to be seen prior to leaving town. His appt is scheduled in [place] and he will go to lab there to have PSA done prior. Just wanted to make sure order is in since he is going earlier than originally scheduled.

## 2023-04-24 ENCOUNTER — Encounter: Payer: Self-pay | Admitting: Dermatology

## 2023-04-24 NOTE — Telephone Encounter (Signed)
Orders are in already.  FYI to Dr Apolinar Junes

## 2023-05-29 ENCOUNTER — Ambulatory Visit (INDEPENDENT_AMBULATORY_CARE_PROVIDER_SITE_OTHER): Payer: PRIVATE HEALTH INSURANCE | Admitting: Dermatology

## 2023-05-29 DIAGNOSIS — D2261 Melanocytic nevi of right upper limb, including shoulder: Secondary | ICD-10-CM

## 2023-05-29 DIAGNOSIS — Z872 Personal history of diseases of the skin and subcutaneous tissue: Secondary | ICD-10-CM

## 2023-05-29 DIAGNOSIS — Z1283 Encounter for screening for malignant neoplasm of skin: Secondary | ICD-10-CM

## 2023-05-29 DIAGNOSIS — Z85820 Personal history of malignant melanoma of skin: Secondary | ICD-10-CM

## 2023-05-29 DIAGNOSIS — L578 Other skin changes due to chronic exposure to nonionizing radiation: Secondary | ICD-10-CM | POA: Diagnosis not present

## 2023-05-29 DIAGNOSIS — L814 Other melanin hyperpigmentation: Secondary | ICD-10-CM | POA: Diagnosis not present

## 2023-05-29 DIAGNOSIS — W908XXA Exposure to other nonionizing radiation, initial encounter: Secondary | ICD-10-CM

## 2023-05-29 DIAGNOSIS — D1801 Hemangioma of skin and subcutaneous tissue: Secondary | ICD-10-CM

## 2023-05-29 DIAGNOSIS — L570 Actinic keratosis: Secondary | ICD-10-CM

## 2023-05-29 DIAGNOSIS — D229 Melanocytic nevi, unspecified: Secondary | ICD-10-CM

## 2023-05-29 DIAGNOSIS — L821 Other seborrheic keratosis: Secondary | ICD-10-CM

## 2023-05-29 DIAGNOSIS — D225 Melanocytic nevi of trunk: Secondary | ICD-10-CM

## 2023-05-29 NOTE — Progress Notes (Signed)
 Follow-Up Visit   Subjective  Dennis Hicks is a 68 y.o. male who presents for the following: Skin Cancer Screening and Full Body Skin Exam  The patient presents for Total-Body Skin Exam (TBSE) for skin cancer screening and mole check. The patient has spots, moles and lesions to be evaluated, some may be new or changing and the patient may have concern these could be cancer. He has a history of melanoma of the right lateral chest. He has a spot on right thigh to recheck. History of AKs.  This will be the patient's last visit as he is moving to [place], .   The following portions of the chart were reviewed this encounter and updated as appropriate: medications, allergies, medical history  Review of Systems:  No other skin or systemic complaints except as noted in HPI or Assessment and Plan.  Objective  Well appearing patient in no apparent distress; mood and affect are within normal limits.  A full examination was performed including scalp, head, eyes, ears, nose, lips, neck, chest, axillae, abdomen, back, buttocks, bilateral upper extremities, bilateral lower extremities, hands, feet, fingers, toes, fingernails, and toenails. All findings within normal limits unless otherwise noted below.   Relevant physical exam findings are noted in the Assessment and Plan.  L forehead x 2, L temple x 1, R temple x 2, L nasal dorsum x 1 (6) Pink scaly macules.  Assessment & Plan   SKIN CANCER SCREENING PERFORMED TODAY.  ACTINIC DAMAGE - Chronic condition, secondary to cumulative UV/sun exposure - diffuse scaly erythematous macules with underlying dyspigmentation - Recommend daily broad spectrum sunscreen SPF 30+ to sun-exposed areas, reapply every 2 hours as needed.  - Staying in the shade or wearing long sleeves, sun glasses (UVA+UVB protection) and wide brim hats (4-inch brim around the entire circumference of the hat) are also recommended for sun protection.  - Call for new or  changing lesions.  LENTIGINES, SEBORRHEIC KERATOSES, HEMANGIOMAS - Benign normal skin lesions - Benign-appearing - Call for any changes  MELANOCYTIC NEVI - Tan-brown and/or pink-flesh-colored symmetric macules and papules - right medial chest 0.5 x 0.4 cm med light brown macule  -right shoulder 8 mm tan speckled flesh papule  - Benign appearing on exam today - Observation - Call clinic for new or changing moles - Recommend daily use of broad spectrum spf 30+ sunscreen to sun-exposed areas.   HISTORY OF MELANOMA. R lat chest, Clark's level III/Early IV, Breslow's 0.28mm, excised 02/20/2012  - No evidence of recurrence today - Recommend regular full body skin exams - Recommend daily broad spectrum sunscreen SPF 30+ to sun-exposed areas, reapply every 2 hours as needed.  - Call if any new or changing lesions are noted between office visits.  SEBORRHEIC KERATOSIS - 1.23mm right glabella waxy brown macule, lighter center - waxy brown papule and macules of the right medial thigh, bilateral hands, left spinal mid lower back - Benign-appearing - Discussed benign etiology and prognosis. - Observe - Call for any changes  AK (ACTINIC KERATOSIS) (6) L forehead x 2, L temple x 1, R temple x 2, L nasal dorsum x 1 (6) Actinic keratoses are precancerous spots that appear secondary to cumulative UV radiation exposure/sun exposure over time. They are chronic with expected duration over 1 year. A portion of actinic keratoses will progress to squamous cell carcinoma of the skin. It is not possible to reliably predict which spots will progress to skin cancer and so treatment is recommended to prevent development of  skin cancer.  Recommend daily broad spectrum sunscreen SPF 30+ to sun-exposed areas, reapply every 2 hours as needed.  Recommend staying in the shade or wearing long sleeves, sun glasses (UVA+UVB protection) and wide brim hats (4-inch brim around the entire circumference of the hat). Call  for new or changing lesions. Destruction of lesion - L forehead x 2, L temple x 1, R temple x 2, L nasal dorsum x 1 (6)  Destruction method: cryotherapy   Informed consent: discussed and consent obtained   Lesion destroyed using liquid nitrogen: Yes   Region frozen until ice ball extended beyond lesion: Yes   Outcome: patient tolerated procedure well with no complications   Post-procedure details: wound care instructions given   Additional details:  Prior to procedure, discussed risks of blister formation, small wound, skin dyspigmentation, or rare scar following cryotherapy. Recommend Vaseline ointment to treated areas while healing.  SKIN CANCER SCREENING   ACTINIC SKIN DAMAGE   LENTIGO   SEBORRHEIC KERATOSIS   HEMANGIOMA OF SKIN   NEVUS   HISTORY OF MELANOMA   Return Patient moving to [place], [place]. Pt may schedule 6 mo TBSE.Wendee Beavers, CMA, am acting as scribe for Willeen Niece, MD .   Documentation: I have reviewed the above documentation for accuracy and completeness, and I agree with the above.  Willeen Niece, MD

## 2023-05-29 NOTE — Patient Instructions (Addendum)
 Cryotherapy Aftercare  Wash gently with soap and water everyday.   Apply Vaseline and Band-Aid daily until healed.    May start treatment for precancers  - Start 5-fluorouracil cream twice a day for 5-7 days to affected areas including face.  Apply Vaseline to areas after treatment as healing. Reviewed course of treatment and expected reaction.  Patient advised to expect inflammation and crusting and advised that erosions are possible.  Patient advised to be diligent with sun protection during and after treatment. Handout with details of how to apply medication and what to expect provided. Counseled to keep medication out of reach of children and pets.    Melanoma ABCDEs  Melanoma is the most dangerous type of skin cancer, and is the leading cause of death from skin disease.  You are more likely to develop melanoma if you: Have light-colored skin, light-colored eyes, or red or blond hair Spend a lot of time in the sun Tan regularly, either outdoors or in a tanning bed Have had blistering sunburns, especially during childhood Have a close family member who has had a melanoma Have atypical moles or large birthmarks  Early detection of melanoma is key since treatment is typically straightforward and cure rates are extremely high if we catch it early.   The first sign of melanoma is often a change in a mole or a new dark spot.  The ABCDE system is a way of remembering the signs of melanoma.  A for asymmetry:  The two halves do not match. B for border:  The edges of the growth are irregular. C for color:  A mixture of colors are present instead of an even brown color. D for diameter:  Melanomas are usually (but not always) greater than 6mm - the size of a pencil eraser. E for evolution:  The spot keeps changing in size, shape, and color.  Please check your skin once per month between visits. You can use a small mirror in front and a large mirror behind you to keep an eye on the back side or  your body.   If you see any new or changing lesions before your next follow-up, please call to schedule a visit.  Please continue daily skin protection including broad spectrum sunscreen SPF 30+ to sun-exposed areas, reapplying every 2 hours as needed when you're outdoors.   Staying in the shade or wearing long sleeves, sun glasses (UVA+UVB protection) and wide brim hats (4-inch brim around the entire circumference of the hat) are also recommended for sun protection.    Due to recent changes in healthcare laws, you may see results of your pathology and/or laboratory studies on MyChart before the doctors have had a chance to review them. We understand that in some cases there may be results that are confusing or concerning to you. Please understand that not all results are received at the same time and often the doctors may need to interpret multiple results in order to provide you with the best plan of care or course of treatment. Therefore, we ask that you please give [place] 2 business days to thoroughly review all your results before contacting the office for clarification. Should we see a critical lab result, you will be contacted sooner.   If You Need Anything After Your Visit  If you have any questions or concerns for your doctor, please call our main line at [phone] and press option 4 to reach your doctor's medical assistant. If no one answers, please leave a voicemail  as directed and we will return your call as soon as possible. Messages left after 4 pm will be answered the following business day.   You may also send [place] a message via MyChart. We typically respond to MyChart messages within 1-2 business days.  For prescription refills, please ask your pharmacy to contact our office. Our fax number is [phone].  If you have an urgent issue when the clinic is closed that cannot wait until the next business day, you can page your doctor at the number below.    Please note that while we  do our best to be available for urgent issues outside of office hours, we are not available 24/7.   If you have an urgent issue and are unable to reach [place], you may choose to seek medical care at your doctor's office, retail clinic, urgent care center, or emergency room.  If you have a medical emergency, please immediately call 911 or go to the emergency department.  Pager Numbers  - Dr. Gwen Pounds: [phone]  - Dr. Roseanne Reno: [phone]  - Dr. Katrinka Blazing: [phone]   In the event of inclement weather, please call our main line at [phone] for an update on the status of any delays or closures.  Dermatology Medication Tips: Please keep the boxes that topical medications come in in order to help keep track of the instructions about where and how to use these. Pharmacies typically print the medication instructions only on the boxes and not directly on the medication tubes.   If your medication is too expensive, please contact our office at [phone] option 4 or send [place] a message through MyChart.   We are unable to tell what your co-pay for medications will be in advance as this is different depending on your insurance coverage. However, we may be able to find a substitute medication at lower cost or fill out paperwork to get insurance to cover a needed medication.   If a prior authorization is required to get your medication covered by your insurance company, please allow [place] 1-2 business days to complete this process.  Drug prices often vary depending on where the prescription is filled and some pharmacies may offer cheaper prices.  The website www.goodrx.com contains coupons for medications through different pharmacies. The prices here do not account for what the cost may be with help from insurance (it may be cheaper with your insurance), but the website can give you the price if you did not use any insurance.  - You can print the associated coupon and take it with your prescription  to the pharmacy.  - You may also stop by our office during regular business hours and pick up a GoodRx coupon card.  - If you need your prescription sent electronically to a different pharmacy, notify our office through [facility] or by phone at [phone] option 4.     Si Usted Necesita Algo Despus de Su Visita  Tambin puede enviarnos un mensaje a travs de Clinical cytogeneticist. Por lo general respondemos a los mensajes de MyChart en el transcurso de 1 a 2 das hbiles.  Para renovar recetas, por favor pida a su farmacia que se ponga en contacto con nuestra oficina. Annie Sable de fax es [place] [phone].  Si tiene un asunto urgente cuando la clnica est cerrada y que no puede esperar hasta el siguiente da hbil, puede llamar/localizar a su doctor(a) al nmero que aparece a continuacin.   Por favor, tenga en cuenta que aunque hacemos todo lo  posible para estar disponibles para asuntos urgentes fuera del horario de oficina, no estamos disponibles las 24 horas del da, los 7 [address]  [address] de la [place].   Si tiene un problema urgente y no puede comunicarse con nosotros, puede optar por buscar atencin mdica  en el consultorio de su doctor(a), en una clnica privada, en un centro de atencin urgente o en una sala de emergencias.  Si tiene Engineer, drilling, por favor llame inmediatamente al 911 o vaya a la sala de emergencias.  Nmeros de bper  - Dr. Gwen Pounds: [phone]  - Dra. Roseanne Reno: [phone]  - Dr. Katrinka Blazing: [phone]   En caso de inclemencias del tiempo, por favor llame a Lacy Duverney principal al [phone] para una actualizacin sobre el [place] de cualquier retraso o cierre.  Consejos para la medicacin en dermatologa: Por favor, guarde las cajas en las que vienen los medicamentos de uso tpico para ayudarle a seguir las instrucciones sobre dnde y cmo usarlos. Las farmacias generalmente imprimen las instrucciones del medicamento slo en las cajas y no directamente  en los tubos del [place].   Si su medicamento es muy caro, por favor, pngase en contacto con Rolm Gala llamando al [phone] y presione la opcin 4 o envenos un mensaje a travs de Clinical cytogeneticist.   No podemos decirle cul ser su copago por los medicamentos por adelantado ya que esto es diferente dependiendo de la cobertura de su seguro. Sin embargo, es posible que podamos encontrar un medicamento sustituto a Audiological scientist un formulario para que el seguro cubra el medicamento que se considera necesario.   Si se requiere una autorizacin previa para que su compaa de seguros [place] su medicamento, por favor permtanos de 1 a 2 das hbiles para completar [address]  Los precios de los medicamentos varan con frecuencia dependiendo del Environmental consultant de dnde se surte la receta y alguna farmacias pueden ofrecer precios ms baratos.  El sitio web www.goodrx.com tiene cupones para medicamentos de Health and safety inspector. Los precios aqu no tienen en cuenta lo que podra costar con la ayuda del seguro (puede ser ms barato con su seguro), pero el sitio web puede darle el precio si no utiliz Tourist information centre manager.  - Puede imprimir el cupn correspondiente y llevarlo con su receta a la farmacia.  - Tambin puede pasar por nuestra oficina durante el horario de atencin regular y Education officer, museum una tarjeta de cupones de GoodRx.  - Si necesita que su receta se enve electrnicamente a una farmacia diferente, informe a nuestra oficina a travs de MyChart de [place] o por telfono llamando al [phone] y presione la opcin 4.

## 2023-05-31 ENCOUNTER — Other Ambulatory Visit
Admission: RE | Admit: 2023-05-31 | Discharge: 2023-05-31 | Disposition: A | Discharge status: Home / Self Care | Attending: Urology | Admitting: Urology

## 2023-05-31 ENCOUNTER — Encounter: Payer: Self-pay | Admitting: Urology

## 2023-05-31 DIAGNOSIS — C61 Malignant neoplasm of prostate: Secondary | ICD-10-CM | POA: Diagnosis present

## 2023-05-31 LAB — PSA: Prostatic Specific Antigen: <0.01 ng/mL (ref 0.00–4.00)

## 2023-06-01 ENCOUNTER — Encounter: Payer: Self-pay | Admitting: Family Medicine

## 2023-06-01 DIAGNOSIS — N3041 Irradiation cystitis with hematuria: Secondary | ICD-10-CM

## 2023-06-01 DIAGNOSIS — E1122 Type 2 diabetes mellitus with diabetic chronic kidney disease: Secondary | ICD-10-CM

## 2023-06-02 ENCOUNTER — Ambulatory Visit (INDEPENDENT_AMBULATORY_CARE_PROVIDER_SITE_OTHER): Payer: Medicare (Managed Care) | Admitting: Urology

## 2023-06-02 VITALS — BP 119/74 | HR 86 | Ht 67.0 in | Wt 178.0 lb

## 2023-06-02 DIAGNOSIS — C61 Malignant neoplasm of prostate: Secondary | ICD-10-CM

## 2023-06-02 DIAGNOSIS — N393 Stress incontinence (female) (male): Secondary | ICD-10-CM | POA: Diagnosis not present

## 2023-06-02 DIAGNOSIS — N529 Male erectile dysfunction, unspecified: Secondary | ICD-10-CM

## 2023-06-02 NOTE — Telephone Encounter (Signed)
 Yes that is correct.

## 2023-06-02 NOTE — Progress Notes (Signed)
 Dennis Hicks,acting as a scribe for Dennis Scotland, MD.,have documented all relevant documentation on the behalf of Dennis Scotland, MD,as directed by  Dennis Scotland, MD while in the presence of Dennis Scotland, MD.  06/02/23 3:54 PM   Dennis Hicks [birth year] [id number]  Referring provider: Excell Seltzer, MD [address] [place],  [place] [address]  Chief Complaint  Patient presents with   Prostate Cancer    HPI: 68 y/o malewith a personal history of high-risk prostate cancer who returns today for his last follow up as he is moving. 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   Surgical pathology consistent with high risk Gleason 4+5 prostate cancer with invasion of the bladder neck, extraprostatic extension, negative margins, negative lymph nodes, no seminal vesicle involvement.  pT3a N0 MX.  Adjuvant XRT completed 01/2019.    PSA remains undetectable as of 05/31/2023.   He also has severe erectile dysfunction and sometimes uses Trimix.   He had an artificial urinary sphincter placed in 2023, which was complicated by erosion, and underwent a redo.   He reports intermittent hematuria, particularly after straining with bowel movements or physical exertion, and has passed small clots. He is concerned about the recurrence of symptoms and is considering further hyperbaric oxygen therapy. He is moving to be closer to family and is in the process of finding a new urologist. He is currently managing his condition with a Dennis Hicks, which has reduced his usage of incontinence products.   He is also experiencing shoulder pain due to a previous rotator Hicks tear. He is active, working on home renovations, and is concerned about the impact of physical activity on his symptoms.   PMH: Past Medical History:  Diagnosis Date   Actinic keratosis     Borderline diabetes    Bronchitis    COVID-19 virus infection 04/16/2020   Diabetes mellitus without complication (HCC)    Headache    Heartburn    HLD (hyperlipidemia)    Hypertension    Melanoma (HCC) 01/24/2012   R lat chest, Clark's level III/early IV, Breslow's 0.62mm, excised 02/20/2012   Pneumonia    x2 Aug. 2017   Psoriasis    Renal insufficiency     Surgical History: Past Surgical History:  Procedure Laterality Date   FINGER GANGLION CYST EXCISION     PELVIC LYMPH NODE DISSECTION Bilateral 02/19/2018   Procedure: PELVIC LYMPH NODE DISSECTION;  Surgeon: Dennis Scotland, MD;  Location: [facility] ORS;  Service: Urology;  Laterality: Bilateral;   ROBOT ASSISTED LAPAROSCOPIC RADICAL PROSTATECTOMY N/A 02/19/2018   Procedure: ROBOTIC ASSISTED LAPAROSCOPIC RADICAL PROSTATECTOMY;  Surgeon: Dennis Scotland, MD;  Location: [facility] OR[facility];  Service: Urology;  Laterality: N/A;    Home Medications:  Allergies as of 06/02/2023       Reactions   Chocolate Other (See Comments)   Trigger Migraine   Ibuprofen Other (See Comments)   Kidney issues   Shrimp [shellfish Allergy] Other (See Comments)   Trigger Migraines        Medication List        Accurate as of June 02, 2023  3:54 PM. If you have any questions, ask your nurse or doctor.          STOP taking these medications    guaiFENesin-codeine 100-10 MG/5ML syrup Commonly known as: ROBITUSSIN AC   oseltamivir 75 MG capsule Commonly known  as: TAMIFLU   predniSONE 20 MG tablet Commonly known as: DELTASONE       TAKE these medications    atorvastatin 40 MG tablet Commonly known as: LIPITOR TAKE 1 TABLET DAILY   benazepril-hydrochlorthiazide 20-25 MG tablet Commonly known as: LOTENSIN HCT TAKE 1 TABLET DAILY   betamethasone dipropionate 0.05 % cream Apply topically 2 (two) times daily.   blood glucose meter kit and supplies Dispense based on patient and insurance preference. Use up to four times daily as directed.  (FOR ICD-10 E10.9, E11.9).   HYDROcodone-acetaminophen 5-325 MG tablet Commonly known as: NORCO/VICODIN Take 1 tablet by mouth daily as needed for moderate pain. What changed:  when to take this additional instructions   metFORMIN 500 MG 24 hr tablet Commonly known as: GLUCOPHAGE-XR TAKE 2 TABLETS DAILY WITH BREAKFAST   SUMAtriptan 100 MG tablet Commonly known as: IMITREX TAKE 1 TABLET BY MOUTH EVERY 2 HOURS AS NEEDED, MAX OF 2 TABLETS IN 24 HOURS        Allergies:  Allergies  Allergen Reactions   Chocolate Other (See Comments)    Trigger Migraine   Ibuprofen Other (See Comments)    Kidney issues   Shrimp [Shellfish Allergy] Other (See Comments)    Trigger Migraines    Family History: Family History  Problem Relation Age of Onset   Hypertension Mother    Coronary artery disease Father    Hypertension Father    Hyperlipidemia Father    Diabetes Father    Prostate cancer Neg Hx    Kidney cancer Neg Hx    Bladder Cancer Neg Hx     Social History:  reports that he has never smoked. He has never used smokeless tobacco. He reports current alcohol use. He reports that he does not currently use drugs.   Physical Exam: BP 119/74 (BP Location: Left Arm, Patient Position: Sitting, Hicks Size: Normal)   Pulse 86   Ht 5\' 7"  (1.702 m)   Wt 178 lb (80.7 kg)   SpO2 100%   BMI 27.88 kg/m   Constitutional:  Alert and oriented, No acute distress. HEENT: [place] AT, moist mucus membranes.  Trachea midline, no masses. Neurologic: Grossly intact, no focal deficits, moving all 4 extremities. Psychiatric: Normal mood and affect.   Assessment & Plan:    1. Prostate Cancer - PSA remains undetectable as of 05-31-2023.  - Continue monitoring PSA levels.  - He is advised to find a local urologist or have primary care check PSA levels.  2. Artificial Urinary Sphincter Erosion - He had a redo procedure. No current plans for replacement due to previous complications including  sepsis. - He is advised to monitor symptoms and consider future replacement if necessary.  3. Radiation Cystitis - He experiences intermittent hematuria, likely related to radiation cystitis.  - Advised to consider hyperbaric oxygen therapy if bleeding persists for several days, clots are passed, or if it affects blood counts.  - Recommended bladder scope every two years to monitor for potential bladder cancer due to radiation risk.  4. Severe Erectile Dysfunction - He uses Trimix as needed. No changes in management discussed.  5. Urinary Incontinence Management - He uses custom-ordered sleeves and Dennis Hicks.  - Advised to continue current management and send provider a link to the supplier for potential sharing with others   Return in about 6 months (around 12/03/2023), or if symptoms worsen or fail to improve.  I have reviewed the above documentation for accuracy and completeness, and I agree  with the above.   Dennis Scotland, MD   [facility] [address] [place], [place] [address] [phone]  I spent 31 total minutes on the day of the encounter including pre-visit review of the medical record, face-to-face time with the patient, and post visit ordering of labs/imaging/tests.

## 2023-06-06 ENCOUNTER — Encounter: Payer: Self-pay | Admitting: Urology

## 2023-06-09 NOTE — Addendum Note (Signed)
 Addended by: Kerby Nora E on: 06/09/2023 11:19 AM   Modules accepted: Orders

## 2023-06-15 ENCOUNTER — Encounter: Payer: Self-pay | Admitting: Urology

## 2023-06-15 ENCOUNTER — Other Ambulatory Visit: Payer: Self-pay | Admitting: Family Medicine

## 2023-06-21 ENCOUNTER — Other Ambulatory Visit: Payer: Self-pay | Admitting: *Deleted

## 2023-06-21 NOTE — Addendum Note (Signed)
 Addended by: Damita Lack on: 06/21/2023 08:54 AM   Modules accepted: Orders

## 2023-07-04 ENCOUNTER — Ambulatory Visit: Payer: PRIVATE HEALTH INSURANCE | Admitting: Dermatology

## 2023-07-04 ENCOUNTER — Other Ambulatory Visit (INDEPENDENT_AMBULATORY_CARE_PROVIDER_SITE_OTHER): Payer: PRIVATE HEALTH INSURANCE

## 2023-07-04 ENCOUNTER — Encounter: Payer: Self-pay | Admitting: Family Medicine

## 2023-07-04 DIAGNOSIS — N1831 Chronic kidney disease, stage 3a: Secondary | ICD-10-CM

## 2023-07-04 DIAGNOSIS — N3041 Irradiation cystitis with hematuria: Secondary | ICD-10-CM | POA: Diagnosis not present

## 2023-07-04 DIAGNOSIS — E1122 Type 2 diabetes mellitus with diabetic chronic kidney disease: Secondary | ICD-10-CM

## 2023-07-04 LAB — CBC WITH DIFFERENTIAL/PLATELET
Basophils Absolute: 0.1 10*3/uL (ref 0.0–0.1)
Basophils Relative: 0.9 % (ref 0.0–3.0)
Eosinophils Absolute: 0.2 10*3/uL (ref 0.0–0.7)
Eosinophils Relative: 3 % (ref 0.0–5.0)
HCT: 44.7 % (ref 39.0–52.0)
Hemoglobin: 15.1 g/dL (ref 13.0–17.0)
Lymphocytes Relative: 47.6 % — ABNORMAL HIGH (ref 12.0–46.0)
Lymphs Abs: 3.5 10*3/uL (ref 0.7–4.0)
MCHC: 33.7 g/dL (ref 30.0–36.0)
MCV: 89.2 fl (ref 78.0–100.0)
Monocytes Absolute: 0.6 10*3/uL (ref 0.1–1.0)
Monocytes Relative: 8.2 % (ref 3.0–12.0)
Neutro Abs: 3 10*3/uL (ref 1.4–7.7)
Neutrophils Relative %: 40.3 % — ABNORMAL LOW (ref 43.0–77.0)
Platelets: 245 10*3/uL (ref 150.0–400.0)
RBC: 5.01 Mil/uL (ref 4.22–5.81)
RDW: 13.4 % (ref 11.5–15.5)
WBC: 7.4 10*3/uL (ref 4.0–10.5)

## 2023-07-04 LAB — LIPID PANEL
Cholesterol: 107 mg/dL (ref 0–200)
HDL: 32.3 mg/dL — ABNORMAL LOW (ref >39.00)
LDL Cholesterol: 59 mg/dL (ref 0–99)
NonHDL: 75.02
Total CHOL/HDL Ratio: 3
Triglycerides: 79 mg/dL (ref 0.0–149.0)
VLDL: 15.8 mg/dL (ref 0.0–40.0)

## 2023-07-04 LAB — COMPREHENSIVE METABOLIC PANEL WITH GFR
ALT: 27 U/L (ref 0–53)
AST: 27 U/L (ref 0–37)
Albumin: 4.6 g/dL (ref 3.5–5.2)
Alkaline Phosphatase: 60 U/L (ref 39–117)
BUN: 12 mg/dL (ref 6–23)
CO2: 30 meq/L (ref 19–32)
Calcium: 9.6 mg/dL (ref 8.4–10.5)
Chloride: 98 meq/L (ref 96–112)
Creatinine, Ser: 1.06 mg/dL (ref 0.40–1.50)
GFR: 73.19 mL/min (ref >60.00)
Glucose, Bld: 116 mg/dL — ABNORMAL HIGH (ref 70–99)
Potassium: 3.9 meq/L (ref 3.5–5.1)
Sodium: 136 meq/L (ref 135–145)
Total Bilirubin: 0.8 mg/dL (ref 0.2–1.2)
Total Protein: 6.6 g/dL (ref 6.0–8.3)

## 2023-07-04 LAB — HEMOGLOBIN A1C: Hgb A1c MFr Bld: 6.4 % (ref 4.6–6.5)

## 2023-07-04 LAB — MICROALBUMIN / CREATININE URINE RATIO
Creatinine,U: 27.4 mg/dL
Microalb Creat Ratio: 32.7 mg/g — ABNORMAL HIGH (ref 0.0–30.0)
Microalb, Ur: 0.9 mg/dL (ref 0.0–1.9)

## 2023-07-04 NOTE — Progress Notes (Signed)
 No critical labs need to be addressed urgently. We will discuss labs in detail at upcoming office visit.

## 2023-07-11 ENCOUNTER — Encounter: Payer: PRIVATE HEALTH INSURANCE | Admitting: Family Medicine

## 2023-07-28 ENCOUNTER — Ambulatory Visit: Payer: PRIVATE HEALTH INSURANCE

## 2023-08-01 ENCOUNTER — Encounter: Payer: Self-pay | Admitting: Family Medicine

## 2023-08-01 ENCOUNTER — Ambulatory Visit (INDEPENDENT_AMBULATORY_CARE_PROVIDER_SITE_OTHER): Payer: Medicare (Managed Care) | Admitting: Family Medicine

## 2023-08-01 VITALS — BP 106/60 | HR 83 | Temp 97.8°F | Ht 65.75 in | Wt 174.1 lb

## 2023-08-01 DIAGNOSIS — Z0000 Encounter for general adult medical examination without abnormal findings: Secondary | ICD-10-CM | POA: Diagnosis not present

## 2023-08-01 DIAGNOSIS — N181 Chronic kidney disease, stage 1: Secondary | ICD-10-CM

## 2023-08-01 DIAGNOSIS — E1122 Type 2 diabetes mellitus with diabetic chronic kidney disease: Secondary | ICD-10-CM | POA: Diagnosis not present

## 2023-08-01 DIAGNOSIS — E785 Hyperlipidemia, unspecified: Secondary | ICD-10-CM

## 2023-08-01 DIAGNOSIS — M545 Low back pain, unspecified: Secondary | ICD-10-CM

## 2023-08-01 DIAGNOSIS — G8929 Other chronic pain: Secondary | ICD-10-CM

## 2023-08-01 DIAGNOSIS — E1169 Type 2 diabetes mellitus with other specified complication: Secondary | ICD-10-CM | POA: Diagnosis not present

## 2023-08-01 DIAGNOSIS — R809 Proteinuria, unspecified: Secondary | ICD-10-CM

## 2023-08-01 DIAGNOSIS — I152 Hypertension secondary to endocrine disorders: Secondary | ICD-10-CM

## 2023-08-01 DIAGNOSIS — Z8546 Personal history of malignant neoplasm of prostate: Secondary | ICD-10-CM

## 2023-08-01 DIAGNOSIS — E1159 Type 2 diabetes mellitus with other circulatory complications: Secondary | ICD-10-CM | POA: Diagnosis not present

## 2023-08-01 DIAGNOSIS — N1831 Chronic kidney disease, stage 3a: Secondary | ICD-10-CM

## 2023-08-01 LAB — HM DIABETES FOOT EXAM

## 2023-08-01 NOTE — Patient Instructions (Signed)
 Get PNA at pharmacy  or in [place].  Move forward with eye exam and colonoscopy after move.

## 2023-08-01 NOTE — Assessment & Plan Note (Signed)
 Chronic, improved control   benazepril /HCTZ 20/25 mg daily to half a tablet daily.

## 2023-08-01 NOTE — Assessment & Plan Note (Addendum)
 Chronic,  IMproived At least in part due to diabetes.

## 2023-08-01 NOTE — Assessment & Plan Note (Signed)
 Followed by urology.

## 2023-08-01 NOTE — Assessment & Plan Note (Signed)
Excellent control on metformin 500 mg XR 2 tablets daily.

## 2023-08-01 NOTE — Progress Notes (Signed)
 Patient ID: Dennis Hicks, male    DOB: 1955/11/07, 68 y.o.   MRN: [id number]  This visit was conducted in person.  BP 106/60   Pulse 83   Temp 97.8 F (36.6 C) (Temporal)   Ht 5' 5.75" (1.67 m)   Wt 174 lb 2 oz (79 kg)   SpO2 99%   BMI 28.32 kg/m    Chief Complaint  Patient presents with   Annual Exam    MWV scheduled for 08/24/23     Subjective:   HPI: Dennis Hicks is a 68 y.o. male presenting on 08/01/2023 for Annual Exam (MWV scheduled for 08/24/23)  The patient presents for complete physical and review of chronic health problems. He/She also has the following acute concerns today: none  AMW, scheduled 08/25/2023  Elevated Cholesterol: atorvastatin  40 mg daily.  LDL at goal less than 100. Lab Results  Component Value Date   CHOL 107 07/04/2023   HDL 32.30 (L) 07/04/2023   LDLCALC 59 07/04/2023   LDLDIRECT 158.2 01/03/2013   TRIG 79.0 07/04/2023   CHOLHDL 3 07/04/2023  Using medications without problems: none Muscle aches:  none Diet compliance: hearth healthy diet Exercise:   Has stopped given hematuria Wt Readings from Last 3 Encounters:  08/01/23 174 lb 2 oz (79 kg)  06/02/23 178 lb (80.7 kg)  11/29/22 181 lb 8 oz (82.3 kg)  Body mass index is 28.32 kg/m. Other complaints:  Hypertension:  Well-controlled on benazepril  hydrochlorothiazide  1/2 tablet  20/25 mg p.o. daily BP Readings from Last 3 Encounters:  08/01/23 106/60  06/02/23 119/74  11/29/22 110/62  Using medication without problems or lightheadedness: none Chest pain with exertion: None Edema: None Short of breath: None Average home BPs: Other issues:  Diabetes: Excellent control on metformin  500 mg XR 2 tablets daily.  Has had some increase in alcohol and eating out when travelling. Lab Results  Component Value Date   HGBA1C 6.4 07/04/2023  Using medications without difficulties: Hypoglycemic episodes:? Hyperglycemic episodes:? Feet problems: no ulcers Blood Sugars  averaging: not checking, eye exam within last year: no  CKD, mild: GFR improved Urine microalbumin negative Hx of prostate cancer: Followed by urology Has had some continued blood in urine.. referred to hyperbaric treatments.    Relevant past medical, surgical, family and social history reviewed and updated as indicated. Interim medical history since our last visit reviewed. Allergies and medications reviewed and updated. Outpatient Medications Prior to Visit  Medication Sig Dispense Refill   atorvastatin  (LIPITOR) 40 MG tablet TAKE 1 TABLET DAILY 90 tablet 2   benazepril -hydrochlorthiazide (LOTENSIN  HCT) 20-25 MG tablet TAKE 1 TABLET DAILY (Patient taking differently: Take 0.5 tablets by mouth daily.) 90 tablet 3   betamethasone  dipropionate 0.05 % cream Apply topically 2 (two) times daily. 30 g 0   blood glucose meter kit and supplies Dispense based on patient and insurance preference. Use up to four times daily as directed. (FOR ICD-10 E10.9, E11.9). 1 each 0   HYDROcodone -acetaminophen  (NORCO/VICODIN) 5-325 MG tablet Take 1 tablet by mouth daily as needed for moderate pain. 45 tablet 0   metFORMIN  (GLUCOPHAGE -XR) 500 MG 24 hr tablet TAKE 2 TABLETS DAILY WITH BREAKFAST 180 tablet 0   SUMAtriptan  (IMITREX ) 100 MG tablet TAKE 1 TABLET BY MOUTH EVERY 2 HOURS AS NEEDED, MAX OF 2 TABLETS IN 24 HOURS 10 tablet 0   No facility-administered medications prior to visit.     Per HPI unless specifically indicated in ROS section below Review of  Systems  Constitutional:  Negative for fatigue and fever.  HENT:  Negative for ear pain.   Eyes:  Negative for pain.  Respiratory:  Negative for cough and shortness of breath.   Cardiovascular:  Negative for chest pain, palpitations and leg swelling.  Gastrointestinal:  Negative for abdominal pain.  Genitourinary:  Negative for dysuria.  Musculoskeletal:  Negative for arthralgias.  Neurological:  Negative for syncope, light-headedness and headaches.   Psychiatric/Behavioral:  Negative for dysphoric mood.    Objective:  BP 106/60   Pulse 83   Temp 97.8 F (36.6 C) (Temporal)   Ht 5' 5.75" (1.67 m)   Wt 174 lb 2 oz (79 kg)   SpO2 99%   BMI 28.32 kg/m   Wt Readings from Last 3 Encounters:  08/01/23 174 lb 2 oz (79 kg)  06/02/23 178 lb (80.7 kg)  11/29/22 181 lb 8 oz (82.3 kg)      Physical Exam Constitutional:      General: He is not in acute distress.    Appearance: Normal appearance. He is well-developed. He is not ill-appearing or toxic-appearing.  HENT:     Head: Normocephalic and atraumatic.     Right Ear: Hearing, tympanic membrane, ear canal and external ear normal.     Left Ear: Hearing, tympanic membrane, ear canal and external ear normal.     Nose: Nose normal.     Mouth/Throat:     Pharynx: Uvula midline.  Eyes:     General: Lids are normal. Lids are everted, no foreign bodies appreciated.     Conjunctiva/sclera: Conjunctivae normal.     Pupils: Pupils are equal, round, and reactive to light.  Neck:     Thyroid: No thyroid mass or thyromegaly.     Vascular: No carotid bruit.     Trachea: Trachea and phonation normal.  Cardiovascular:     Rate and Rhythm: Normal rate and regular rhythm.     Pulses: Normal pulses.     Heart sounds: S1 normal and S2 normal. No murmur heard.    No gallop.  Pulmonary:     Breath sounds: Normal breath sounds. No wheezing, rhonchi or rales.  Abdominal:     General: Bowel sounds are normal.     Palpations: Abdomen is soft.     Tenderness: There is no abdominal tenderness. There is no guarding or rebound.     Hernia: No hernia is present.  Musculoskeletal:     Cervical back: Neck supple. Tenderness present. No bony tenderness. No pain with movement. Decreased range of motion.     Comments: Pt states swelling intermittently in neck.. not clearly palpated on my exam today.  Lymphadenopathy:     Cervical: No cervical adenopathy.  Skin:    General: Skin is warm and dry.      Findings: No rash.  Neurological:     Mental Status: He is alert.     Cranial Nerves: No cranial nerve deficit.     Sensory: No sensory deficit.     Gait: Gait normal.     Deep Tendon Reflexes: Reflexes are normal and symmetric.  Psychiatric:        Speech: Speech normal.        Behavior: Behavior normal.        Judgment: Judgment normal.       Diabetic foot exam: Normal inspection No skin breakdown No calluses  Normal DP pulses Normal sensation to light touch and monofilament Nails normal  Results for orders placed or  performed in visit on 08/01/23  HM DIABETES FOOT EXAM   Collection Time: 08/01/23 12:00 AM  Result Value Ref Range   HM Diabetic Foot Exam done      COVID 19 screen:  No recent travel or known exposure to COVID19 The patient denies respiratory symptoms of COVID 19 at this time. The importance of social distancing was discussed today.   Assessment and Plan The patient's preventative maintenance and recommended screening tests for an annual wellness exam were reviewed in full today. Brought up to date unless services declined.  Counselled on the importance of diet, exercise, and its role in overall health and mortality. The patient's FH and SH was reviewed, including their home life, tobacco status, and drug and alcohol status.    Due for PNA and shingrix.... will do after moves  Referred for  colonoscopy but pt did not follow through... will do once he moves.  PSA followed by Uro , prostate cancer history.  Problem List Items Addressed This Visit     Chronic low back pain (Chronic)    Improved with weight loss.. now only occ using tylenol .      CKD stage 1 due to type 2 diabetes mellitus (HCC)   Chronic,  IMproived At least in part due to diabetes.        History of prostate cancer    Followed by urology.      Hyperlipidemia associated with type 2 diabetes mellitus (HCC) (Chronic)    Chronic,  good control    atorvastatin  40 mg daily.        Hypertension associated with diabetes (HCC) (Chronic)   Chronic, improved control   benazepril /HCTZ 20/25 mg daily to half a tablet daily.        Microalbuminuria    May due to hematuria.. from radiation cystitis.. plans hyperbaric treatment.  Recheck again after hematuria resolved.   GFR is normal.      Type 2 diabetes mellitus with renal complication (HCC) (Chronic)   Excellent control on metformin  500 mg XR 2 tablets daily.         Other Visit Diagnoses       Routine general medical examination at a health care facility    -  Primary       Herby Lolling, MD

## 2023-08-01 NOTE — Assessment & Plan Note (Addendum)
 Improved with weight loss.. now only occ using tylenol .

## 2023-08-01 NOTE — Assessment & Plan Note (Signed)
Chronic,  good control    atorvastatin 40 mg daily.

## 2023-08-01 NOTE — Assessment & Plan Note (Signed)
 May due to hematuria.. from radiation cystitis.. plans hyperbaric treatment.  Recheck again after hematuria resolved.   GFR is normal.

## 2023-08-24 ENCOUNTER — Ambulatory Visit: Payer: PRIVATE HEALTH INSURANCE

## 2023-08-24 VITALS — Ht 67.0 in | Wt 174.0 lb

## 2023-08-24 DIAGNOSIS — Z0000 Encounter for general adult medical examination without abnormal findings: Secondary | ICD-10-CM

## 2023-08-24 NOTE — Progress Notes (Signed)
 Subjective:   Dennis Hicks is a 68 y.o. who presents for a Medicare Wellness preventive visit.  As a reminder, Annual Wellness Visits don't include a physical exam, and some assessments may be limited, especially if this visit is performed virtually. We may recommend an in-person follow-up visit with your provider if needed.  Visit Complete: Virtual I connected with  Dennis Hicks on 08/24/23 by a audio enabled telemedicine application and verified that I am speaking with the correct person using two identifiers.  Patient Location: Home  Provider Location: Office/Clinic  I discussed the limitations of evaluation and management by telemedicine. The patient expressed understanding and agreed to proceed.  Vital Signs: Because this visit was a virtual/telehealth visit, some criteria may be missing or patient reported. Any vitals not documented were not able to be obtained and vitals that have been documented are patient reported.  VideoDeclined- This patient declined Librarian, academic. Therefore the visit was completed with audio only.  Persons Participating in Visit: Patient.  AWV Questionnaire: No: Patient Medicare AWV questionnaire was not completed prior to this visit.  Cardiac Risk Factors include: advanced age (>48men, >7 women);diabetes mellitus;dyslipidemia;male gender;hypertension;sedentary lifestyle     Objective:     Today's Vitals   08/24/23 1053 08/24/23 1054  Weight: 174 lb (78.9 kg)   Height: 5\' 7"  (1.702 m)   PainSc:  7    Body mass index is 27.25 kg/m.     08/24/2023   11:04 AM 08/21/2020   11:22 AM 12/13/2019    9:09 AM 06/04/2019   10:43 AM 03/04/2019    1:19 PM 10/18/2018   11:07 AM 02/19/2018    6:00 PM  Advanced Directives  Does Patient Have a Medical Advance Directive? No No No No No No No  Would patient like information on creating a medical advance directive?   No - Patient declined No - Patient declined No -  Patient declined No - Patient declined No - Patient declined    Current Medications (verified) Outpatient Encounter Medications as of 08/24/2023  Medication Sig   atorvastatin  (LIPITOR) 40 MG tablet TAKE 1 TABLET DAILY   benazepril -hydrochlorthiazide (LOTENSIN  HCT) 20-25 MG tablet TAKE 1 TABLET DAILY (Patient taking differently: Take 0.5 tablets by mouth daily.)   betamethasone  dipropionate 0.05 % cream Apply topically 2 (two) times daily.   blood glucose meter kit and supplies Dispense based on patient and insurance preference. Use up to four times daily as directed. (FOR ICD-10 E10.9, E11.9).   HYDROcodone -acetaminophen  (NORCO/VICODIN) 5-325 MG tablet Take 1 tablet by mouth daily as needed for moderate pain.   metFORMIN  (GLUCOPHAGE -XR) 500 MG 24 hr tablet TAKE 2 TABLETS DAILY WITH BREAKFAST   SUMAtriptan  (IMITREX ) 100 MG tablet TAKE 1 TABLET BY MOUTH EVERY 2 HOURS AS NEEDED, MAX OF 2 TABLETS IN 24 HOURS (Patient not taking: Reported on 08/24/2023)   No facility-administered encounter medications on file as of 08/24/2023.    Allergies (verified) Chocolate, Ibuprofen, and Shrimp [shellfish allergy]   History: Past Medical History:  Diagnosis Date   Actinic keratosis    Allergy    Borderline diabetes    Bronchitis    Clotting disorder (HCC)    COVID-19 virus infection 04/16/2020   Diabetes mellitus without complication (HCC)    GERD (gastroesophageal reflux disease)    Headache    Heartburn    HLD (hyperlipidemia)    Hypertension    Melanoma (HCC) 01/24/2012   R lat chest, Clark's level III/early IV, Breslow's  0.49mm, excised 02/20/2012   Pneumonia    x2 Aug. 2017   Psoriasis    Renal insufficiency    Past Surgical History:  Procedure Laterality Date   FINGER GANGLION CYST EXCISION     PELVIC LYMPH NODE DISSECTION Bilateral 02/19/2018   Procedure: PELVIC LYMPH NODE DISSECTION;  Surgeon: Dustin Gimenez, MD;  Location: [facility];  Service: Urology;  Laterality: Bilateral;   ROBOT  ASSISTED LAPAROSCOPIC RADICAL PROSTATECTOMY N/A 02/19/2018   Procedure: ROBOTIC ASSISTED LAPAROSCOPIC RADICAL PROSTATECTOMY;  Surgeon: Dustin Gimenez, MD;  Location: [facility];  Service: Urology;  Laterality: N/A;   Family History  Problem Relation Age of Onset   Hypertension Mother    Cancer Mother    Coronary artery disease Father    Hypertension Father    Hyperlipidemia Father    Diabetes Father    Heart disease Father    Kidney disease Father    Prostate cancer Neg Hx    Kidney cancer Neg Hx    Bladder Cancer Neg Hx    Social History   Socioeconomic History   Marital status: Married    Spouse name: Not on file   Number of children: Not on file   Years of education: Not on file   Highest education level: Some college, no degree  Occupational History   Occupation: Geologist, engineering: [employer]  Tobacco Use   Smoking status: Never   Smokeless tobacco: Never  Vaping Use   Vaping status: Former  Substance and Sexual Activity   Alcohol use: Yes    Comment: occ.   Drug use: Not Currently   Sexual activity: Not on file  Other Topics Concern   Not on file  Social History Narrative   Regular exercise--yes   Social Drivers of Health   Financial Resource Strain: Low Risk  (08/24/2023)   Overall Financial Resource Strain (CARDIA)    Difficulty of Paying Living Expenses: Not hard at all  Food Insecurity: No Food Insecurity (08/24/2023)   Hunger Vital Sign    Worried About Running Out of Food in the Last Year: Never true    Ran Out of Food in the Last Year: Never true  Transportation Needs: No Transportation Needs (08/24/2023)   PRAPARE - Administrator, Civil Service (Medical): No    Lack of Transportation (Non-Medical): No  Physical Activity: Inactive (08/24/2023)   Exercise Vital Sign    Days of Exercise per Week: 0 days    Minutes of Exercise per Session: 0 min  Stress: No Stress Concern Present (08/24/2023)   [employer] of Occupational  Health - Occupational Stress Questionnaire    Feeling of Stress : Not at all  Social Connections: Moderately Integrated (08/24/2023)   Social Connection and Isolation Panel [NHANES]    Frequency of Communication with Friends and Family: More than three times a week    Frequency of Social Gatherings with Friends and Family: More than three times a week    Attends Religious Services: More than 4 times per year    Active Member of [employer] or Organizations: No    Attends Engineer, structural: Never    Marital Status: Married    Tobacco Counseling Counseling given: Not Answered    Clinical Intake:  Pre-visit preparation completed: Yes  Pain : 0-10 Pain Score: 7  Pain Type: Chronic pain Pain Location: Neck Pain Descriptors / Indicators: Aching Pain Onset: More than a month ago Pain Frequency: Intermittent Pain Relieving Factors:  analgesic, neck massage and TENS, Tylenol ,heat  Pain Relieving Factors: analgesic, neck massage and TENS, Tylenol ,heat  BMI - recorded: 27.25 Nutritional Status: BMI 25 -29 Overweight Nutritional Risks: None Diabetes: Yes CBG done?: No Did pt. bring in CBG monitor from home?: No  Lab Results  Component Value Date   HGBA1C 6.4 07/04/2023   HGBA1C 6.8 (H) 07/04/2022   HGBA1C 6.4 10/18/2021     How often do you need to have someone help you when you read instructions, pamphlets, or other written materials from your doctor or pharmacy?: 1 - Never  Interpreter Needed?: No  Comments: lives with wife Information entered by :: B.Tudor Chandley,LPN   Activities of Daily Living     08/24/2023   11:07 AM  In your present state of health, do you have any difficulty performing the following activities:  Hearing? 0  Vision? 0  Difficulty concentrating or making decisions? 0  Walking or climbing stairs? 0  Dressing or bathing? 0  Doing errands, shopping? 0  Preparing Food and eating ? N  Using the Toilet? N  In the past six months, have you  accidently leaked urine? Y  Do you have problems with loss of bowel control? N  Managing your Medications? N  Managing your Finances? N  Housekeeping or managing your Housekeeping? N    Patient Care Team: Judithann Novas, MD as PCP - General  I have updated your Care Teams any recent Medical Services you may have received from other providers in the past year.     Assessment:    This is a routine wellness examination for [place].  Hearing/Vision screen Hearing Screening - Comments:: I hear fine a little less in left ear;declines referral  Vision Screening - Comments:: Pt says his vision is good no glasses   Goals Addressed             This Visit's Progress    Patient Stated       I would like to keep my A1C and cholesterol down;maintain good health       Depression Screen     08/24/2023   11:03 AM 08/01/2023   11:34 AM 07/06/2022    8:41 AM 10/22/2021    3:28 PM 06/26/2020    8:49 AM 12/19/2019    9:12 AM 10/09/2018    2:07 PM  PHQ 2/9 Scores  PHQ - 2 Score 0 0 0 0 0 0 0  PHQ- 9 Score   0        Fall Risk     08/24/2023   10:58 AM 08/01/2023   11:34 AM 07/06/2022    8:41 AM  Fall Risk   Falls in the past year? 0 0 0  Number falls in past yr: 0 0 0  Injury with Fall? 0 0 0  Risk for fall due to : No Fall Risks No Fall Risks No Fall Risks  Follow up Education provided;Falls prevention discussed Falls evaluation completed Falls evaluation completed    MEDICARE RISK AT HOME:  Medicare Risk at Home Any stairs in or around the home?: Yes If so, are there any without handrails?: Yes Home free of loose throw rugs in walkways, pet beds, electrical cords, etc?: Yes Adequate lighting in your home to reduce risk of falls?: Yes Life alert?: No Use of a cane, walker or w/c?: No Grab bars in the bathroom?: No Shower chair or bench in shower?: No Elevated toilet seat or a handicapped toilet?: No  TIMED UP AND  GO:  Was the test performed?  No  Cognitive Function: 6CIT  completed        08/24/2023   11:08 AM  6CIT Screen  What Year? 0 points  What month? 0 points  What time? 0 points  Count back from 20 0 points  Months in reverse 0 points  Repeat phrase 0 points  Total Score 0 points    Immunizations Immunization History  Administered Date(s) Administered   Influenza Inj Mdck Quad Pf 04/19/2018   Influenza,inj,Quad PF,6+ Mos 03/10/2015, 12/04/2015, 12/19/2019   PFIZER(Purple Top)SARS-COV-2 Vaccination 06/09/2019, 06/30/2019, 01/01/2020   Pneumococcal Polysaccharide-23 12/04/2015   Tdap 12/04/2015    Screening Tests Health Maintenance  Topic Date Due   Zoster Vaccines- Shingrix (1 of 2) Never done   OPHTHALMOLOGY EXAM  12/05/2019   COVID-19 Vaccine (4 - 2024-25 season) 11/20/2022   COLON CANCER SCREENING ANNUAL FOBT  02/01/2024 (Originally 02/06/2002)   Pneumonia Vaccine 50+ Years old (2 of 2 - PCV) 07/31/2024 (Originally 12/03/2016)   Colonoscopy  07/31/2024 (Originally 02/06/2002)   INFLUENZA VACCINE  10/20/2023   HEMOGLOBIN A1C  01/03/2024   Diabetic kidney evaluation - eGFR measurement  07/03/2024   Diabetic kidney evaluation - Urine ACR  07/03/2024   FOOT EXAM  07/31/2024   Medicare Annual Wellness (AWV)  08/23/2024   DTaP/Tdap/Td (2 - Td or Tdap) 12/03/2025   Hepatitis C Screening  Completed   HPV VACCINES  Aged Out   Meningococcal B Vaccine  Aged Out    Health Maintenance  Health Maintenance Due  Topic Date Due   Zoster Vaccines- Shingrix (1 of 2) Never done   OPHTHALMOLOGY EXAM  12/05/2019   COVID-19 Vaccine (4 - 2024-25 season) 11/20/2022   Health Maintenance Items Addressed: None needed at this time.   Patient declined referral to establish with an ophthalmologist.   Vaccinations: declines: Shingles vaccine: recommend Shingrix which is 2 doses 2-6 months apart and over 90% effective    Pt made aware he can obtain vaccine at his local pharmacy if he decides he would like to receive it.   Additional  Screening:  Vision Screening: Recommended annual ophthalmology exams for early detection of glaucoma and other disorders of the eye. Would you like a referral to an eye doctor? No    Dental Screening: Recommended annual dental exams for proper oral hygiene  Community Resource Referral / Chronic Care Management: CRR required this visit?  No   CCM required this visit?  No   Plan:    I have personally reviewed and noted the following in the patient's chart:   Medical and social history Use of alcohol, tobacco or illicit drugs  Current medications and supplements including opioid prescriptions. Patient is currently taking opioid prescriptions. Information provided to patient regarding non-opioid alternatives. Patient advised to discuss non-opioid treatment plan with their provider. Functional ability and status Nutritional status Physical activity Advanced directives List of other physicians Hospitalizations, surgeries, and ER visits in previous 12 months Vitals Screenings to include cognitive, depression, and falls Referrals and appointments  In addition, I have reviewed and discussed with patient certain preventive protocols, quality metrics, and best practice recommendations. A written personalized care plan for preventive services as well as general preventive health recommendations were provided to patient.   Nerissa Bannister, LPN   03/26/1094   After Visit Summary: (MyChart) Due to this being a telephonic visit, the after visit summary with patients personalized plan was offered to patient via MyChart   Notes: Nothing significant  to report at this time.

## 2023-08-24 NOTE — Patient Instructions (Signed)
 Dennis Hicks , Thank you for taking time out of your busy schedule to complete your Annual Wellness Visit with me. I enjoyed our conversation and look forward to speaking with you again next year. I, as well as your care team,  appreciate your ongoing commitment to your health goals. Please review the following plan we discussed and let me know if I can assist you in the future. Your Game plan/ To Do List     Follow up Visits: Next Medicare AWV with our clinical staff: 08/27/24 @ 10:50am   Have you seen your provider in the last 6 months (3 months if uncontrolled diabetes)? Yes Next Office Visit with your provider: none scheduled yet. Pt maybe looking for new PCP near his home.  Clinician Recommendations:  Aim for 30 minutes of exercise or brisk walking, 6-8 glasses of water, and 5 servings of fruits and vegetables each day.       This is a list of the screening recommended for you and due dates:  Health Maintenance  Topic Date Due   Zoster (Shingles) Vaccine (1 of 2) Never done   Eye exam for diabetics  12/05/2019   COVID-19 Vaccine (4 - 2024-25 season) 11/20/2022   Stool Blood Test  02/01/2024*   Pneumonia Vaccine (2 of 2 - PCV) 07/31/2024*   Colon Cancer Screening  07/31/2024*   Flu Shot  10/20/2023   Hemoglobin A1C  01/03/2024   Yearly kidney function blood test for diabetes  07/03/2024   Yearly kidney health urinalysis for diabetes  07/03/2024   Complete foot exam   07/31/2024   Medicare Annual Wellness Visit  08/23/2024   DTaP/Tdap/Td vaccine (2 - Td or Tdap) 12/03/2025   Hepatitis C Screening  Completed   HPV Vaccine  Aged Out   Meningitis B Vaccine  Aged Out  *Topic was postponed. The date shown is not the original due date.    Advanced directives: (Copy Requested) Please bring a copy of your health care power of attorney and living will to the office to be added to your chart at your convenience. You can mail to [facility] [address] [place], [place]  [address] or email to [email or web address] Advance Care Planning is important because it:  [x]  Makes sure you receive the medical care that is consistent with your values, goals, and preferences  [x]  It provides guidance to your family and loved ones and reduces their decisional burden about whether or not they are making the right decisions based on your wishes.  Follow the link provided in your after visit summary or read over the paperwork we have mailed to you to help you started getting your Advance Directives in place. If you need assistance in completing these, please reach out to us  so that we can help you!

## 2023-09-01 ENCOUNTER — Other Ambulatory Visit: Payer: Self-pay | Admitting: Family Medicine

## 2023-09-13 ENCOUNTER — Other Ambulatory Visit: Payer: Self-pay | Admitting: Family Medicine

## 2023-11-24 ENCOUNTER — Ambulatory Visit: Payer: Self-pay | Admitting: Urology

## 2023-12-04 ENCOUNTER — Encounter: Payer: Self-pay | Admitting: Family Medicine

## 2023-12-06 MED ORDER — BENAZEPRIL-HYDROCHLOROTHIAZIDE 20-25 MG PO TABS: 1.0000 | ORAL_TABLET | Freq: Every day | ORAL | 0 refills | Status: AC

## 2023-12-06 MED ORDER — METFORMIN HCL ER 500 MG PO TB24: 1000.0000 mg | ORAL_TABLET | Freq: Every day | ORAL | 0 refills | Status: AC

## 2023-12-06 NOTE — Telephone Encounter (Signed)
 Refill for Metformin  and Lotensin  HCT sent to [facility].

## 2023-12-26 ENCOUNTER — Ambulatory Visit: Payer: PRIVATE HEALTH INSURANCE | Admitting: Dermatology

## 2023-12-26 ENCOUNTER — Encounter: Payer: Self-pay | Admitting: Dermatology

## 2023-12-26 DIAGNOSIS — L821 Other seborrheic keratosis: Secondary | ICD-10-CM

## 2023-12-26 DIAGNOSIS — D229 Melanocytic nevi, unspecified: Secondary | ICD-10-CM

## 2023-12-26 DIAGNOSIS — L578 Other skin changes due to chronic exposure to nonionizing radiation: Secondary | ICD-10-CM | POA: Diagnosis not present

## 2023-12-26 DIAGNOSIS — Z1283 Encounter for screening for malignant neoplasm of skin: Secondary | ICD-10-CM

## 2023-12-26 DIAGNOSIS — D2261 Melanocytic nevi of right upper limb, including shoulder: Secondary | ICD-10-CM

## 2023-12-26 DIAGNOSIS — W908XXA Exposure to other nonionizing radiation, initial encounter: Secondary | ICD-10-CM | POA: Diagnosis not present

## 2023-12-26 DIAGNOSIS — D1801 Hemangioma of skin and subcutaneous tissue: Secondary | ICD-10-CM

## 2023-12-26 DIAGNOSIS — L814 Other melanin hyperpigmentation: Secondary | ICD-10-CM | POA: Diagnosis not present

## 2023-12-26 DIAGNOSIS — D225 Melanocytic nevi of trunk: Secondary | ICD-10-CM

## 2023-12-26 DIAGNOSIS — Z85820 Personal history of malignant melanoma of skin: Secondary | ICD-10-CM

## 2023-12-26 NOTE — Patient Instructions (Addendum)
 Seborrheic Keratosis  What causes seborrheic keratoses? Seborrheic keratoses are harmless, common skin growths that first appear during adult life.  As time goes by, more growths appear.  Some people may develop a large number of them.  Seborrheic keratoses appear on both covered and uncovered body parts.  They are not caused by sunlight.  The tendency to develop seborrheic keratoses can be inherited.  They vary in color from skin-colored to gray, brown, or even black.  They can be either smooth or have a rough, warty surface.   Seborrheic keratoses are superficial and look as if they were stuck on the skin.  Under the microscope this type of keratosis looks like layers upon layers of skin.  That is why at times the top layer may seem to fall off, but the rest of the growth remains and re-grows.    Treatment Seborrheic keratoses do not need to be treated, but can easily be removed in the office.  Seborrheic keratoses often cause symptoms when they rub on clothing or jewelry.  Lesions can be in the way of shaving.  If they become inflamed, they can cause itching, soreness, or burning.  Removal of a seborrheic keratosis can be accomplished by freezing, burning, or surgery. If any spot bleeds, scabs, or grows rapidly, please return to have it checked, as these can be an indication of a skin cancer.  Due to recent changes in healthcare laws, you may see results of your pathology and/or laboratory studies on MyChart before the doctors have had a chance to review them. We understand that in some cases there may be results that are confusing or concerning to you. Please understand that not all results are received at the same time and often the doctors may need to interpret multiple results in order to provide you with the best plan of care or course of treatment. Therefore, we ask that you please give us  2 business days to thoroughly review all your results before contacting the office for clarification. Should  we see a critical lab result, you will be contacted sooner.   If You Need Anything After Your Visit  If you have any questions or concerns for your doctor, please call our main line at [phone] and press option 4 to reach your doctor's medical assistant. If no one answers, please leave a voicemail as directed and we will return your call as soon as possible. Messages left after 4 pm will be answered the following business day.   You may also send us  a message via MyChart. We typically respond to MyChart messages within 1-2 business days.  For prescription refills, please ask your pharmacy to contact our office. Our fax number is [phone].  If you have an urgent issue when the clinic is closed that cannot wait until the next business day, you can page your doctor at the number below.    Please note that while we do our best to be available for urgent issues outside of office hours, we are not available 24/7.   If you have an urgent issue and are unable to reach us , you may choose to seek medical care at your doctor's office, retail clinic, urgent care center, or emergency room.  If you have a medical emergency, please immediately call 911 or go to the emergency department.  Pager Numbers  - Dr. Hester: [phone]  - Dr. Jackquline: [phone]  - Dr. Claudene: [phone]   - Dr. Raymund: [phone]  In the event of inclement weather,  please call our main line at [phone] for an update on the status of any delays or closures.  Dermatology Medication Tips: Please keep the boxes that topical medications come in in order to help keep track of the instructions about where and how to use these. Pharmacies typically print the medication instructions only on the boxes and not directly on the medication tubes.   If your medication is too expensive, please contact our office at [phone] option 4 or send us  a message through MyChart.   We are unable to tell what your co-pay  for medications will be in advance as this is different depending on your insurance coverage. However, we may be able to find a substitute medication at lower cost or fill out paperwork to get insurance to cover a needed medication.   If a prior authorization is required to get your medication covered by your insurance company, please allow us  1-2 business days to complete this process.  Drug prices often vary depending on where the prescription is filled and some pharmacies may offer cheaper prices.  The website www.goodrx.com contains coupons for medications through different pharmacies. The prices here do not account for what the cost may be with help from insurance (it may be cheaper with your insurance), but the website can give you the price if you did not use any insurance.  - You can print the associated coupon and take it with your prescription to the pharmacy.  - You may also stop by our office during regular business hours and pick up a GoodRx coupon card.  - If you need your prescription sent electronically to a different pharmacy, notify our office through [facility] or by phone at [phone] option 4.     Si Usted Necesita Algo Despus de Su Visita  Tambin puede enviarnos un mensaje a travs de Clinical cytogeneticist. Por lo general respondemos a los mensajes de MyChart en el transcurso de 1 a 2 das hbiles.  Para renovar recetas, por favor pida a su farmacia que se ponga en contacto con nuestra oficina. Randi lakes de fax es [place] [phone].  Si tiene un asunto urgente cuando la clnica est cerrada y que no puede esperar hasta el siguiente da hbil, puede llamar/localizar a su doctor(a) al nmero que aparece a continuacin.   Por favor, tenga en cuenta que aunque hacemos todo lo posible para estar disponibles para asuntos urgentes fuera del horario de [place], no estamos disponibles las 24 horas del da, los 7 [address]  [address] de la [place].   Si tiene un problema urgente y no puede  comunicarse con nosotros, puede optar por buscar atencin mdica  en el consultorio de su doctor(a), en una clnica privada, en un centro de atencin urgente o en una sala de emergencias.  Si tiene Engineer, drilling, por favor llame inmediatamente al 911 o vaya a la sala de emergencias.  Nmeros de bper  - Dr. Hester: [phone]  - Dra. Jackquline: [phone]  - Dr. Claudene: [phone]  - Dra. Kitts: [phone]  En caso de inclemencias del [place], por favor llame a nuestra lnea principal al [phone] para una actualizacin sobre el estado de cualquier retraso o cierre.  Consejos para la medicacin en dermatologa: Por favor, guarde las cajas en las que vienen los medicamentos de uso tpico para ayudarle a seguir las instrucciones sobre dnde y cmo usarlos. Las farmacias generalmente imprimen las instrucciones del medicamento slo en las cajas y no directamente en los tubos del [place].   Si  su medicamento es muy caro, por favor, pngase en contacto con landry rieger llamando al [phone] y presione la opcin 4 o envenos un mensaje a travs de Clinical cytogeneticist.   No podemos decirle cul ser su copago por los medicamentos por adelantado ya que esto es diferente dependiendo de la cobertura de su seguro. Sin embargo, es posible que podamos encontrar un medicamento sustituto a Audiological scientist un formulario para que el seguro cubra el medicamento que se considera necesario.   Si se requiere una autorizacin previa para que su compaa de seguros malta su medicamento, por favor permtanos de 1 a 2 das hbiles para completar este proceso.  Los precios de los medicamentos varan con frecuencia dependiendo del Environmental consultant de dnde se surte la receta y alguna farmacias pueden ofrecer precios ms baratos.  El sitio web www.goodrx.com tiene cupones para medicamentos de Health and safety inspector. Los precios aqu no tienen en cuenta lo que podra costar con la ayuda del seguro (puede ser ms  barato con su seguro), pero el sitio web puede darle el precio si no utiliz Tourist information centre manager.  - Puede imprimir el cupn correspondiente y llevarlo con su receta a la farmacia.  - Tambin puede pasar por nuestra oficina durante el horario de atencin regular y Education officer, museum una tarjeta de cupones de GoodRx.  - Si necesita que su receta se enve electrnicamente a una farmacia diferente, informe a nuestra oficina a travs de MyChart de  o por telfono llamando al [phone] y presione la opcin 4.

## 2023-12-26 NOTE — Progress Notes (Signed)
   Follow-Up Visit   Subjective  Dennis Hicks is a 68 y.o. male who presents for the following: Skin Cancer Screening and Full Body Skin Exam  The patient presents for Total-Body Skin Exam (TBSE) for skin cancer screening and mole check. The patient has spots, moles and lesions to be evaluated, some may be new or changing. History of melanoma.  Patient is now living in [place], [place].    The following portions of the chart were reviewed this encounter and updated as appropriate: medications, allergies, medical history  Review of Systems:  No other skin or systemic complaints except as noted in HPI or Assessment and Plan.  Objective  Well appearing patient in no apparent distress; mood and affect are within normal limits.  A full examination was performed including scalp, head, eyes, ears, nose, lips, neck, chest, axillae, abdomen, back, buttocks, bilateral upper extremities, bilateral lower extremities, hands, feet, fingers, toes, fingernails, and toenails. All findings within normal limits unless otherwise noted below.   Relevant physical exam findings are noted in the Assessment and Plan.    Assessment & Plan   SKIN CANCER SCREENING PERFORMED TODAY.  ACTINIC DAMAGE - Chronic condition, secondary to cumulative UV/sun exposure - diffuse scaly erythematous macules with underlying dyspigmentation - Recommend daily broad spectrum sunscreen SPF 30+ to sun-exposed areas, reapply every 2 hours as needed.  - Staying in the shade or wearing long sleeves, sun glasses (UVA+UVB protection) and wide brim hats (4-inch brim around the entire circumference of the hat) are also recommended for sun protection.  - Call for new or changing lesions.  LENTIGINES, SEBORRHEIC KERATOSES, HEMANGIOMAS - Benign normal skin lesions - Benign-appearing - Call for any changes  MELANOCYTIC NEVI - Tan-brown and/or pink-flesh-colored symmetric macules and papules - right medial chest 0.4 cm med light brown  macule  - right shoulder 8 mm tan speckled flesh papule  - Benign appearing on exam today - Observation - Call clinic for new or changing moles - Recommend daily use of broad spectrum spf 30+ sunscreen to sun-exposed areas.   HISTORY OF MELANOMA. R lat chest, Clark's level III/Early IV, Breslow's 0.64mm, excised 02/20/2012  - No evidence of recurrence today - Recommend regular full body skin exams - Recommend daily broad spectrum sunscreen SPF 30+ to sun-exposed areas, reapply every 2 hours as needed.  - Call if any new or changing lesions are noted between office visits.   SEBORRHEIC KERATOSIS - 1.69mm right glabella waxy brown macule, lighter center - waxy brown papule and macules of the right medial thigh, right paranasal - Benign-appearing - Discussed benign etiology and prognosis. - Observe - Call for any changes  Return in about 6 months (around 06/25/2024) for TBSE, Hx melanoma.  IAndrea Kerns, CMA, am acting as scribe for Rexene Rattler, MD .   Documentation: I have reviewed the above documentation for accuracy and completeness, and I agree with the above.  Rexene Rattler, MD

## 2024-01-20 ENCOUNTER — Encounter: Payer: Self-pay | Admitting: Family Medicine

## 2024-07-01 ENCOUNTER — Encounter: Admitting: Dermatology

## 2024-08-27 ENCOUNTER — Ambulatory Visit
